# Patient Record
Sex: Female | Born: 1970 | Race: White | Hispanic: No | Marital: Married | Smoking: Never smoker
Health system: Southern US, Community
[De-identification: ages and names within clinical notes are randomized; demographics above are authoritative.]

## PROBLEM LIST (undated history)

## (undated) DIAGNOSIS — R519 Headache, unspecified: Secondary | ICD-10-CM

## (undated) DIAGNOSIS — D649 Anemia, unspecified: Secondary | ICD-10-CM

## (undated) DIAGNOSIS — R112 Nausea with vomiting, unspecified: Secondary | ICD-10-CM

## (undated) DIAGNOSIS — Z9889 Other specified postprocedural states: Secondary | ICD-10-CM

## (undated) DIAGNOSIS — F32A Depression, unspecified: Secondary | ICD-10-CM

## (undated) DIAGNOSIS — K219 Gastro-esophageal reflux disease without esophagitis: Secondary | ICD-10-CM

## (undated) DIAGNOSIS — Z8719 Personal history of other diseases of the digestive system: Secondary | ICD-10-CM

## (undated) DIAGNOSIS — T7840XA Allergy, unspecified, initial encounter: Secondary | ICD-10-CM

## (undated) DIAGNOSIS — F419 Anxiety disorder, unspecified: Secondary | ICD-10-CM

## (undated) DIAGNOSIS — J342 Deviated nasal septum: Secondary | ICD-10-CM

## (undated) DIAGNOSIS — F329 Major depressive disorder, single episode, unspecified: Secondary | ICD-10-CM

## (undated) DIAGNOSIS — J343 Hypertrophy of nasal turbinates: Secondary | ICD-10-CM

## (undated) DIAGNOSIS — N189 Chronic kidney disease, unspecified: Secondary | ICD-10-CM

## (undated) DIAGNOSIS — I10 Essential (primary) hypertension: Secondary | ICD-10-CM

## (undated) DIAGNOSIS — M5136 Other intervertebral disc degeneration, lumbar region: Secondary | ICD-10-CM

## (undated) DIAGNOSIS — E039 Hypothyroidism, unspecified: Secondary | ICD-10-CM

## (undated) DIAGNOSIS — R0683 Snoring: Secondary | ICD-10-CM

## (undated) DIAGNOSIS — Z8489 Family history of other specified conditions: Secondary | ICD-10-CM

## (undated) DIAGNOSIS — R51 Headache: Secondary | ICD-10-CM

## (undated) DIAGNOSIS — K589 Irritable bowel syndrome without diarrhea: Secondary | ICD-10-CM

## (undated) DIAGNOSIS — N809 Endometriosis, unspecified: Secondary | ICD-10-CM

## (undated) HISTORY — DX: Essential (primary) hypertension: I10

## (undated) HISTORY — PX: ORIF ANKLE FRACTURE: SHX5408

## (undated) HISTORY — PX: HERNIA REPAIR: SHX51

## (undated) HISTORY — PX: KNEE ARTHROSCOPY: SHX127

## (undated) HISTORY — PX: SPINE SURGERY: SHX786

## (undated) HISTORY — DX: Anemia, unspecified: D64.9

## (undated) HISTORY — DX: Endometriosis, unspecified: N80.9

## (undated) HISTORY — DX: Chronic kidney disease, unspecified: N18.9

## (undated) HISTORY — PX: TUBAL LIGATION: SHX77

## (undated) HISTORY — PX: COLONOSCOPY: SHX174

## (undated) HISTORY — PX: HIATAL HERNIA REPAIR: SHX195

## (undated) HISTORY — PX: UPPER GI ENDOSCOPY: SHX6162

## (undated) HISTORY — DX: Gastro-esophageal reflux disease without esophagitis: K21.9

## (undated) HISTORY — DX: Allergy, unspecified, initial encounter: T78.40XA

---

## 2003-11-11 HISTORY — PX: ABDOMINAL HYSTERECTOMY: SHX81

## 2004-07-04 ENCOUNTER — Ambulatory Visit (HOSPITAL_COMMUNITY): Admission: RE | Admit: 2004-07-04 | Discharge: 2004-07-04 | Payer: Self-pay | Admitting: Unknown Physician Specialty

## 2004-07-10 ENCOUNTER — Ambulatory Visit (HOSPITAL_COMMUNITY): Admission: RE | Admit: 2004-07-10 | Discharge: 2004-07-10 | Payer: Self-pay | Admitting: Unknown Physician Specialty

## 2004-09-02 ENCOUNTER — Encounter: Admission: RE | Admit: 2004-09-02 | Discharge: 2004-09-02 | Payer: Self-pay | Admitting: Specialist

## 2004-10-23 ENCOUNTER — Ambulatory Visit: Payer: Self-pay | Admitting: *Deleted

## 2004-10-29 ENCOUNTER — Ambulatory Visit: Payer: Self-pay | Admitting: Family Medicine

## 2004-11-21 ENCOUNTER — Observation Stay (HOSPITAL_COMMUNITY): Admission: RE | Admit: 2004-11-21 | Discharge: 2004-11-22 | Payer: Self-pay | Admitting: Specialist

## 2004-11-21 HISTORY — PX: LUMBAR LAMINECTOMY/DECOMPRESSION MICRODISCECTOMY: SHX5026

## 2005-01-10 ENCOUNTER — Ambulatory Visit: Payer: Self-pay | Admitting: Family Medicine

## 2005-02-06 ENCOUNTER — Ambulatory Visit: Payer: Self-pay | Admitting: Family Medicine

## 2005-04-04 ENCOUNTER — Ambulatory Visit: Payer: Self-pay | Admitting: Family Medicine

## 2005-07-03 ENCOUNTER — Ambulatory Visit: Payer: Self-pay | Admitting: Family Medicine

## 2005-08-19 ENCOUNTER — Ambulatory Visit: Payer: Self-pay | Admitting: Family Medicine

## 2005-08-21 ENCOUNTER — Emergency Department (HOSPITAL_COMMUNITY): Admission: EM | Admit: 2005-08-21 | Discharge: 2005-08-21 | Payer: Self-pay | Admitting: Emergency Medicine

## 2005-09-10 ENCOUNTER — Ambulatory Visit: Payer: Self-pay | Admitting: Family Medicine

## 2006-04-02 ENCOUNTER — Ambulatory Visit: Payer: Self-pay | Admitting: Family Medicine

## 2006-04-16 ENCOUNTER — Ambulatory Visit: Payer: Self-pay | Admitting: Family Medicine

## 2006-05-04 ENCOUNTER — Ambulatory Visit: Payer: Self-pay | Admitting: Family Medicine

## 2006-10-02 ENCOUNTER — Ambulatory Visit (HOSPITAL_COMMUNITY): Admission: RE | Admit: 2006-10-02 | Discharge: 2006-10-02 | Payer: Self-pay | Admitting: Unknown Physician Specialty

## 2006-10-06 ENCOUNTER — Ambulatory Visit: Payer: Self-pay | Admitting: Family Medicine

## 2006-12-03 ENCOUNTER — Ambulatory Visit: Payer: Self-pay | Admitting: Family Medicine

## 2006-12-18 ENCOUNTER — Ambulatory Visit (HOSPITAL_COMMUNITY): Admission: RE | Admit: 2006-12-18 | Discharge: 2006-12-18 | Payer: Self-pay | Admitting: Unknown Physician Specialty

## 2006-12-24 ENCOUNTER — Ambulatory Visit: Payer: Self-pay | Admitting: Family Medicine

## 2007-03-25 ENCOUNTER — Ambulatory Visit (HOSPITAL_COMMUNITY): Admission: RE | Admit: 2007-03-25 | Discharge: 2007-03-26 | Payer: Self-pay | Admitting: Neurosurgery

## 2007-03-25 HISTORY — PX: ANTERIOR CERVICAL DECOMP/DISCECTOMY FUSION: SHX1161

## 2007-05-04 ENCOUNTER — Ambulatory Visit: Payer: Self-pay | Admitting: Family Medicine

## 2009-02-06 ENCOUNTER — Emergency Department (HOSPITAL_COMMUNITY): Admission: EM | Admit: 2009-02-06 | Discharge: 2009-02-06 | Payer: Self-pay | Admitting: Emergency Medicine

## 2010-01-28 ENCOUNTER — Emergency Department (HOSPITAL_COMMUNITY): Admission: EM | Admit: 2010-01-28 | Discharge: 2010-01-28 | Payer: Self-pay | Admitting: Emergency Medicine

## 2010-11-10 DIAGNOSIS — M5136 Other intervertebral disc degeneration, lumbar region: Secondary | ICD-10-CM

## 2010-11-10 DIAGNOSIS — M51369 Other intervertebral disc degeneration, lumbar region without mention of lumbar back pain or lower extremity pain: Secondary | ICD-10-CM

## 2010-11-10 HISTORY — DX: Other intervertebral disc degeneration, lumbar region without mention of lumbar back pain or lower extremity pain: M51.369

## 2010-11-10 HISTORY — DX: Other intervertebral disc degeneration, lumbar region: M51.36

## 2010-12-01 ENCOUNTER — Encounter: Payer: Self-pay | Admitting: Unknown Physician Specialty

## 2011-02-03 LAB — URINALYSIS, ROUTINE W REFLEX MICROSCOPIC
Bilirubin Urine: NEGATIVE
Glucose, UA: NEGATIVE mg/dL
Ketones, ur: NEGATIVE mg/dL
Leukocytes, UA: NEGATIVE
Nitrite: NEGATIVE
Protein, ur: NEGATIVE mg/dL
Specific Gravity, Urine: 1.025 (ref 1.005–1.030)
Urobilinogen, UA: 0.2 mg/dL (ref 0.0–1.0)
pH: 6 (ref 5.0–8.0)

## 2011-02-03 LAB — URINE MICROSCOPIC-ADD ON

## 2011-02-20 LAB — CBC
HCT: 39.5 % (ref 36.0–46.0)
Hemoglobin: 13.9 g/dL (ref 12.0–15.0)
MCHC: 35.1 g/dL (ref 30.0–36.0)
MCV: 94.7 fL (ref 78.0–100.0)
Platelets: 204 10*3/uL (ref 150–400)
RBC: 4.17 MIL/uL (ref 3.87–5.11)
RDW: 12.5 % (ref 11.5–15.5)
WBC: 10.1 10*3/uL (ref 4.0–10.5)

## 2011-02-20 LAB — DIFFERENTIAL
Basophils Absolute: 0 10*3/uL (ref 0.0–0.1)
Basophils Relative: 0 % (ref 0–1)
Eosinophils Absolute: 0 10*3/uL (ref 0.0–0.7)
Eosinophils Relative: 0 % (ref 0–5)
Lymphocytes Relative: 15 % (ref 12–46)
Lymphs Abs: 1.5 10*3/uL (ref 0.7–4.0)
Monocytes Absolute: 0.9 10*3/uL (ref 0.1–1.0)
Monocytes Relative: 9 % (ref 3–12)
Neutro Abs: 7.6 10*3/uL (ref 1.7–7.7)
Neutrophils Relative %: 75 % (ref 43–77)

## 2011-02-20 LAB — URINE CULTURE: Colony Count: >=100000

## 2011-02-20 LAB — URINE MICROSCOPIC-ADD ON

## 2011-02-20 LAB — URINALYSIS, ROUTINE W REFLEX MICROSCOPIC
Bilirubin Urine: NEGATIVE
Glucose, UA: NEGATIVE mg/dL
Ketones, ur: NEGATIVE mg/dL
Nitrite: POSITIVE — AB
Specific Gravity, Urine: 1.015 (ref 1.005–1.030)
Urobilinogen, UA: 1 mg/dL (ref 0.0–1.0)
pH: 6 (ref 5.0–8.0)

## 2011-02-20 LAB — BASIC METABOLIC PANEL
BUN: 10 mg/dL (ref 6–23)
CO2: 25 mEq/L (ref 19–32)
Calcium: 8.7 mg/dL (ref 8.4–10.5)
Chloride: 101 mEq/L (ref 96–112)
Creatinine, Ser: 0.99 mg/dL (ref 0.4–1.2)
GFR calc Af Amer: >60 mL/min (ref >60)
GFR calc non Af Amer: >60 mL/min (ref >60)
Glucose, Bld: 113 mg/dL — ABNORMAL HIGH (ref 70–99)
Potassium: 3.8 mEq/L (ref 3.5–5.1)
Sodium: 133 mEq/L — ABNORMAL LOW (ref 135–145)

## 2011-03-28 NOTE — Op Note (Signed)
Hannah Lynch, Hannah Lynch                   ACCOUNT NO.:  [id number]   MEDICAL RECORD NO.:  [id number]          PATIENT TYPE:  INP   LOCATION:  0001                         FACILITY:  [facility]   PHYSICIAN:  Jene Every, M.D.    DATE OF BIRTH:  [birth year]   DATE OF PROCEDURE:  11/21/2004  DATE OF DISCHARGE:                                 OPERATIVE REPORT   PREOPERATIVE DIAGNOSIS:  Spinal stenosis, herniated nucleus pulposus, L4-5,  left.   POSTOPERATIVE DIAGNOSIS:  Spinal stenosis, herniated nucleus pulposus, L4-5,  left.   PROCEDURE:  Lateral recess decompression at L4-5, foraminotomy of L5,  microdiskectomy of L4-5.   SURGEON:  Jene Every, M.D.   ASSISTANT:  Roma Schanz, P.A.   INDICATIONS FOR PROCEDURE:  This is a 40 year old with refractory L5  radicular pain secondary to a multifactorial spinal stenosis at 4-5.  Operative intervention is indicated for decompression of the L5 nerve root  due to failure to improve with conservative treatment.  The risks and  benefits were discussed, including bleeding, infection, damage to  neurovascular structures, CSF leakage, epidural fibrosis, adjacent disease,  need for fusion in the future, etc.   DESCRIPTION OF PROCEDURE:  With the patient in the supine position and after  adequate induction of anesthesia and 1 g of Kefzol, she was placed prone on  the __________.  All bony prominences were well-padded.  The lumbar region  was prepped and draped in the usual sterile fashion.   Two 18-gauge spinal needles were utilized to localize the 4-5 interspace,  confirmed with x-ray.  An incision was made from the spinous process 4 to 5.  The subcutaneous tissue was dissected, with cautery utilized to achieve  hemostasis.  The dorsolumbar fascia was identified and divided in line with  the skin incision.  The paraspinous muscle was elevated from the lamina of 4  and 5.  The McCullough retractor was placed.  A Penfield 4 in the inner  laminar  space, confirmed with x-ray at 4-5.  The operating microscope was  draped and brought into the surgical field.  Hemilaminotomy of the caudad  edge of 4 was performed with a high-speed bur and a 3-mm Kerrison.  The  ligamentum flavum attachment of the cephalad edge of 5.  Foraminotomy of L5  was performed.  The lateral recess stenosis was noted as the ligamentum  flavum was removed from the interspace.  This was a combination of facet  hypertrophy, lateral disk protrusion, and a vascular leech.  This vascular  leech was divided and cauterized.  The foraminotomy of L5 was performed.  The 5 root was gently mobilized immediately.  A focal disk protrusion was  noted.  Annulotomy was performed, and full diskectomy of all herniated  material was performed in piecemeal fashion utilizing multiple passes with a  pituitary.  Following this, there was adequate decompression of the L5 nerve  root.  There was good 1-cm excursion medial to the pedicle without  difficulty.  __________ probe  was placed in the foramen of 4 and 5 and  found to be widely  patent.  The disk space was copiously irrigated with  antibiotic irrigation.  Inspection revealed no CSF leakage or active  bleeding.  Bone wax was placed over the cancellous surfaces.   Next, all instrumentation was removed.  The paraspinous muscle was  inspected, and there was no evidence of active bleeding.  The dorsolumbar  fascia was reapproximated with 0 Vicryl interrupted figure-of-eight sutures.  The subcutaneous tissue was reapproximated with 2-0 Vicryl simple sutures.  The skin was reapproximated with 4-0 subcuticular Prolene.  A sterile  dressing was applied.   The patient was placed supine on the hospital bed, extubated without  difficulty, and transported to the recovery room in satisfactory condition.  The patient tolerated the procedure well.  There were no complications.     Trey Paula   JB/MEDQ  D:  11/21/2004  T:  11/21/2004  Job:  [id number]

## 2011-03-28 NOTE — Op Note (Signed)
Hannah Lynch, Hannah Lynch                   ACCOUNT NO.:  [id number]   MEDICAL RECORD NO.:  [id number]          PATIENT TYPE:  AMB   LOCATION:  SDS                          FACILITY:  [facility]   PHYSICIAN:  Danae Orleans. Venetia Maxon, M.D.  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   PROCEDURE:  Anterior cervical decompression and fusion, C4-5 with PEEK  interbody cage, morcellized bone autograft along with demineralized bone  matrix and anterior cervical plate.   SURGEON:  Danae Orleans. Venetia Maxon, M.D.   ASSISTANT:  Georgiann Cocker, RN.  Hilda Lias, M.D.   ANESTHESIA:  General endotracheal anesthesia.   BLOOD LOSS:  Minimal.   COMPLICATIONS:  None.   DISPOSITION:  Recovery.   INDICATIONS:  Hannah Lynch is a 40 year old woman with significant right C5  radiculopathy with right deltoid weakness and right side and arm and  neck pain.  She is disk herniation at C4-5 on the right.  We have  elected to take her surgery for anterior cervical decompression and  fusion at this affected level.   PROCEDURE:  Miss Cannady day was brought to the operating room.  Following  satisfactory uncomplicated induction of general endotracheal anesthesia  and placing an intravenous lines, the patient was placed in the supine  position on the operating table.  Her neck was placed in slight  extension.  She was placed in 10 pounds of halter traction.  Her  anterior neck was then prepped and draped in the usual sterile fashion.  The area of planned incision was infiltrated with 0.25% Marcaine and  0.5% lidocaine with 1:200,000 epinephrine.  Incision was made  from  midline to the anterior border of the sternocleidomastoid muscle and  sharply through platysma layer.  Subplatysmal dissection was performed  exposing the anterior border of the sternocleidomastoid muscle using  blunt dissection.  The  carotid sheath was kept lateral, the trachea and  esophagus kept medial, exposing the anterior cervical spine.  Bent  spinal needle was placed in what was felt to be the C4-5 level and this  was confirmed on intraoperative x-ray.  Subsequently the longus colli  muscles were taken down from the anterior cervical spine from C4 through  5 levels bilaterally using electrocautery and Key elevator and self-  retaining shadow line retractor was placed to facilitate exposure.  The  interspace was then incised and with meticulous care was removed in  piecemeal fashion.  The end plates were stripped of residual disk  material from the cartilaginous end plates.  Disk space spreaders were  placed using initially to one side and then to the other.  The end  plates were decorticated and uncinate spurs drilled down.  There was  foraminal disk herniation on the right at the  C4-5 level causing right  C5 nerve root compression.  The posterior longitudinal ligament was then  incised and removed in piecemeal fashion and uncinate spurs were removed  with 2 and 3 mm  gold-tipped Kerrison rongeurs.  The C5 nerve roots were  felt to be well decompressed bilaterally, particularly on the right.  After hemostasis was assured with Gelfoam soaked in thrombin, and after  trial sizing a 7-mm small PEEK interbody cage was selected, packed with  morcellized bone autograft which had been retained from drilling of the  end plates and mixed with demineralized bone matrix and subsequently  this was tamped into position and countersunk appropriately.  The  anterior cervical spine was then prepared for plating and subsequently  an anterior cervical plate with 14 mm variable angle screws  were placed,  two at C4, two at C5. All screws had excellent purchase and locking  mechanisms were engaged.  Final x-ray demonstrated well-positioned  interbody graft anterior cervical plate.  The traction weight was  removed prior to placing the plate.  Subsequently hemostasis assured.  Soft tissues inspected and found to be in good repair and the platysma  layer was reapproximated with 3-0 Vicryl sutures.  The skin edges were  approximated with 3-0 Vicryl interrupted inverted sutures.  The wound  was dressed with Dermabond.  The patient was extubated in the operating  room, taken to recovery room in stable satisfactory condition, having  tolerated the procedure well.  Counts correct at the end of the case.      Danae Orleans. Venetia Maxon, M.D.  Electronically Signed     JDS/MEDQ  D:  03/25/2007  T:  03/25/2007  Job:  [id number]

## 2011-09-28 ENCOUNTER — Emergency Department (HOSPITAL_COMMUNITY)
Admission: EM | Admit: 2011-09-28 | Discharge: 2011-09-29 | Disposition: A | Discharge status: Home / Self Care | Payer: Self-pay | Attending: Emergency Medicine | Admitting: Emergency Medicine

## 2011-09-28 ENCOUNTER — Encounter: Payer: Self-pay | Admitting: *Deleted

## 2011-09-28 DIAGNOSIS — J40 Bronchitis, not specified as acute or chronic: Secondary | ICD-10-CM | POA: Insufficient documentation

## 2011-09-28 DIAGNOSIS — R220 Localized swelling, mass and lump, head: Secondary | ICD-10-CM | POA: Insufficient documentation

## 2011-09-28 DIAGNOSIS — M546 Pain in thoracic spine: Secondary | ICD-10-CM | POA: Insufficient documentation

## 2011-09-28 DIAGNOSIS — M940 Chondrocostal junction syndrome [Tietze]: Secondary | ICD-10-CM | POA: Insufficient documentation

## 2011-09-28 DIAGNOSIS — R05 Cough: Secondary | ICD-10-CM | POA: Insufficient documentation

## 2011-09-28 DIAGNOSIS — R071 Chest pain on breathing: Secondary | ICD-10-CM | POA: Insufficient documentation

## 2011-09-28 DIAGNOSIS — R070 Pain in throat: Secondary | ICD-10-CM | POA: Insufficient documentation

## 2011-09-28 DIAGNOSIS — H579 Unspecified disorder of eye and adnexa: Secondary | ICD-10-CM | POA: Insufficient documentation

## 2011-09-28 DIAGNOSIS — R059 Cough, unspecified: Secondary | ICD-10-CM | POA: Insufficient documentation

## 2011-09-28 DIAGNOSIS — R1110 Vomiting, unspecified: Secondary | ICD-10-CM | POA: Insufficient documentation

## 2011-09-28 DIAGNOSIS — H5789 Other specified disorders of eye and adnexa: Secondary | ICD-10-CM | POA: Insufficient documentation

## 2011-09-28 MED ORDER — IPRATROPIUM BROMIDE 0.02 % IN SOLN
0.5000 mg | Freq: Once | RESPIRATORY_TRACT | Status: AC
  Administered 2011-09-29: 0.5 mg via RESPIRATORY_TRACT
  Filled 2011-09-28: qty 2.5

## 2011-09-28 MED ORDER — ALBUTEROL SULFATE (5 MG/ML) 0.5% IN NEBU
5.0000 mg | INHALATION_SOLUTION | Freq: Once | RESPIRATORY_TRACT | Status: AC
  Administered 2011-09-29: 5 mg via RESPIRATORY_TRACT
  Filled 2011-09-28: qty 1

## 2011-09-28 NOTE — ED Notes (Signed)
PA at bedside for exam.

## 2011-09-28 NOTE — ED Notes (Signed)
C/o cold sx x2 weeks, runny nose, productive yellow/green congestion, cough, sore throat, (denies: fever, nvd), describes post tussive emesis, "has been real tired", also pain with inspiration & cough in chest and shoulder blades. Non-smoker, denies BCP.

## 2011-09-29 ENCOUNTER — Emergency Department (HOSPITAL_COMMUNITY): Payer: Self-pay

## 2011-09-29 LAB — RAPID STREP SCREEN (MED CTR MEBANE ONLY): Streptococcus, Group A Screen (Direct): NEGATIVE

## 2011-09-29 MED ORDER — IBUPROFEN 400 MG PO TABS: 400.0000 mg | ORAL_TABLET | Freq: Three times a day (TID) | ORAL | Status: AC | PRN

## 2011-09-29 MED ORDER — IBUPROFEN 800 MG PO TABS
800.0000 mg | ORAL_TABLET | Freq: Once | ORAL | Status: AC
  Administered 2011-09-29: 800 mg via ORAL
  Filled 2011-09-29: qty 1

## 2011-09-29 MED ORDER — HYDROCOD POLST-CHLORPHEN POLST 10-8 MG/5ML PO LQCR: 5.0000 mL | Freq: Two times a day (BID) | ORAL | Status: DC | PRN

## 2011-09-29 MED ORDER — HYDROCOD POLST-CHLORPHEN POLST 10-8 MG/5ML PO LQCR
5.0000 mL | Freq: Once | ORAL | Status: AC
  Administered 2011-09-29: 5 mL via ORAL
  Filled 2011-09-29: qty 5

## 2011-09-29 MED ORDER — ALBUTEROL SULFATE HFA 108 (90 BASE) MCG/ACT IN AERS
2.0000 | INHALATION_SPRAY | RESPIRATORY_TRACT | Status: DC | PRN
  Administered 2011-09-29: 2 via RESPIRATORY_TRACT
  Filled 2011-09-29: qty 6.7

## 2011-09-29 NOTE — ED Provider Notes (Signed)
Medical screening examination/treatment/procedure(s) were performed by non-physician practitioner and as supervising physician I was immediately available for consultation/collaboration.  Jon R Knapp, MD 09/29/11 0506 

## 2011-09-29 NOTE — ED Notes (Signed)
Patient transported to X-ray 

## 2011-09-29 NOTE — ED Notes (Signed)
Called to [facility] [place], busy at this time, breathing treatment initiated

## 2011-09-29 NOTE — ED Provider Notes (Signed)
History     CSN: [id number] Arrival date & time: 09/28/2011  8:10 PM   First MD Initiated Contact with Patient 09/28/11 2316      Chief Complaint  Patient presents with  . URI    (Consider location/radiation/quality/duration/timing/severity/associated sxs/prior treatment) Patient is a 40 y.o. female presenting with URI. The history is provided by the patient. No language interpreter was used.  URI The primary symptoms include sore throat, cough and vomiting. Primary symptoms do not include fever, ear pain, swollen glands or wheezing. The current episode started more than 1 week ago. This is a new problem. The problem has been gradually worsening.  The sore throat is not accompanied by stridor.  Patient reports 2 week history of persistent cough productive at times is productive of yellowish green secretions. Cough has also been associated with sore throat watery itchy eyes and post tussive vomiting. States her chest wall and back now hurt with coughing. History reviewed. No pertinent past medical history.  Past Surgical History  Procedure Date  . Abdominal hysterectomy   . Back surgery   . Neck surgery   . Ankle surgery   . Knee surgery     Family History  Problem Relation Age of Onset  . Hypertension Mother   . Diabetes Mother   . Heart failure Father   . Hypertension Father   . Diabetes Father   . Heart failure Other     History  Substance Use Topics  . Smoking status: Never Smoker   . Smokeless tobacco: Not on file  . Alcohol Use: No    OB History    Grav Para Term Preterm Abortions TAB SAB Ect Mult Living                  Review of Systems  Constitutional: Negative.  Negative for fever.  HENT: Positive for sore throat. Negative for ear pain.   Eyes: Negative.   Respiratory: Positive for cough. Negative for shortness of breath, wheezing and stridor.   Cardiovascular: Negative.   Gastrointestinal: Positive for vomiting.  Genitourinary: Negative.     Musculoskeletal: Negative.   Skin: Negative.   Neurological: Negative.   Hematological: Negative.   Psychiatric/Behavioral: Negative.     Allergies  Review of patient's allergies indicates no known allergies.  Home Medications   Current Outpatient Rx  Name Route Sig Dispense Refill  . DEXTROMETHORPHAN POLISTIREX 30 MG/5ML PO LQCR Oral Take 60 mg by mouth at bedtime as needed. For cough, to help sleep     . OMEGA-3 FATTY ACIDS 1000 MG PO CAPS Oral Take 1 g by mouth daily.      Ronney Asters COLD MULTI-SYMPTOM PO Oral Take 2 tablets by mouth 2 (two) times daily as needed. For cold/flu symptoms       BP 123/79  Pulse 103  Temp(Src) 98.2 F (36.8 C) (Oral)  Resp 20  Ht 5\' 5"  (1.651 m)  Wt 200 lb (90.719 kg)  BMI 33.28 kg/m2  SpO2 97%  Physical Exam  Constitutional: She is oriented to person, place, and time. She appears well-developed and well-nourished.  HENT:  Head: Normocephalic and atraumatic.  Mouth/Throat: Uvula is midline and mucous membranes are normal. Posterior oropharyngeal edema and posterior oropharyngeal erythema present. No oropharyngeal exudate or tonsillar abscesses.  Eyes: Conjunctivae are normal.  Neck: Normal range of motion.  Cardiovascular: Normal rate and regular rhythm.   Pulmonary/Chest: Effort normal and breath sounds normal.       Frequent coughing noted  Musculoskeletal: Normal range of motion.  Neurological: She is alert and oriented to person, place, and time.  Skin: Skin is warm and dry.  Psychiatric: She has a normal mood and affect.    ED Course  Procedures patient reports cough improved after neb. We'll plan for discharge home with treatment for bronchitis and encourage followup with primary care provider sometime this week. Patient agreeable with plan.   Labs Reviewed  RAPID STREP SCREEN   Dg Chest 2 View  09/29/2011  *RADIOLOGY REPORT*  Clinical Data: Cough, shortness of breath, chest pain.  CHEST - 2 VIEW  Comparison: None.   Findings: Minimal left lung base scarring versus atelectasis. Otherwise, no focal consolidation, pleural effusion, or pneumothorax. Cardiomediastinal contours within normal limits. Partially imaged cervical fusion hardware.  No acute osseous abnormality.  IMPRESSION: Minimal left lung base scarring versus atelectasis. Otherwise, no acute cardiopulmonary process.  Original Report Authenticated By: Waneta Martins, M.D.     No diagnosis found.    MDM  HPI, PE and diagnostic findings c/w bronchitis.        Leanne Chang, NP 09/29/11 0120

## 2011-11-11 HISTORY — PX: CERVICAL FUSION: SHX112

## 2011-12-18 ENCOUNTER — Other Ambulatory Visit: Payer: Self-pay | Admitting: Neurosurgery

## 2011-12-18 DIAGNOSIS — M47812 Spondylosis without myelopathy or radiculopathy, cervical region: Secondary | ICD-10-CM

## 2011-12-21 ENCOUNTER — Ambulatory Visit
Admission: RE | Admit: 2011-12-21 | Discharge: 2011-12-21 | Disposition: A | Discharge status: Home / Self Care | Payer: BC Managed Care – PPO | Source: Ambulatory Visit | Attending: Neurosurgery | Admitting: Neurosurgery

## 2011-12-21 DIAGNOSIS — M47812 Spondylosis without myelopathy or radiculopathy, cervical region: Secondary | ICD-10-CM

## 2013-11-18 ENCOUNTER — Other Ambulatory Visit: Payer: Self-pay

## 2013-11-18 DIAGNOSIS — Z1231 Encounter for screening mammogram for malignant neoplasm of breast: Secondary | ICD-10-CM

## 2013-12-08 ENCOUNTER — Ambulatory Visit
Admission: RE | Admit: 2013-12-08 | Discharge: 2013-12-08 | Disposition: A | Discharge status: Home / Self Care | Payer: Medicaid Other | Source: Ambulatory Visit

## 2013-12-08 DIAGNOSIS — Z1231 Encounter for screening mammogram for malignant neoplasm of breast: Secondary | ICD-10-CM

## 2014-02-08 ENCOUNTER — Ambulatory Visit: Payer: Medicaid Other

## 2014-03-07 ENCOUNTER — Encounter (HOSPITAL_COMMUNITY): Payer: Self-pay | Admitting: Emergency Medicine

## 2014-03-07 ENCOUNTER — Emergency Department (HOSPITAL_COMMUNITY)
Admission: EM | Admit: 2014-03-07 | Discharge: 2014-03-07 | Disposition: A | Discharge status: Home / Self Care | Payer: No Typology Code available for payment source | Attending: Emergency Medicine | Admitting: Emergency Medicine

## 2014-03-07 ENCOUNTER — Emergency Department (HOSPITAL_COMMUNITY): Payer: No Typology Code available for payment source

## 2014-03-07 DIAGNOSIS — S161XXA Strain of muscle, fascia and tendon at neck level, initial encounter: Secondary | ICD-10-CM

## 2014-03-07 DIAGNOSIS — S139XXA Sprain of joints and ligaments of unspecified parts of neck, initial encounter: Secondary | ICD-10-CM | POA: Insufficient documentation

## 2014-03-07 DIAGNOSIS — Y939 Activity, unspecified: Secondary | ICD-10-CM | POA: Insufficient documentation

## 2014-03-07 DIAGNOSIS — R209 Unspecified disturbances of skin sensation: Secondary | ICD-10-CM | POA: Insufficient documentation

## 2014-03-07 DIAGNOSIS — R202 Paresthesia of skin: Secondary | ICD-10-CM

## 2014-03-07 DIAGNOSIS — IMO0002 Reserved for concepts with insufficient information to code with codable children: Secondary | ICD-10-CM | POA: Insufficient documentation

## 2014-03-07 DIAGNOSIS — Z79899 Other long term (current) drug therapy: Secondary | ICD-10-CM | POA: Insufficient documentation

## 2014-03-07 MED ORDER — HYDROCODONE-ACETAMINOPHEN 5-325 MG PO TABS: ORAL_TABLET | ORAL | Status: DC

## 2014-03-07 MED ORDER — HYDROCODONE-ACETAMINOPHEN 5-325 MG PO TABS
2.0000 | ORAL_TABLET | Freq: Once | ORAL | Status: AC
  Administered 2014-03-07: 2 via ORAL
  Filled 2014-03-07: qty 2

## 2014-03-07 NOTE — ED Notes (Signed)
Patient transported to CT 
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
## 2014-03-07 NOTE — Discharge Instructions (Signed)
Cervical Sprain °A cervical sprain is an injury in the neck in which the strong, fibrous tissues (ligaments) that connect your neck bones stretch or tear. Cervical sprains can range from mild to severe. Severe cervical sprains can cause the neck vertebrae to be unstable. This can lead to damage of the spinal cord and can result in serious nervous system problems. The amount of time it takes for a cervical sprain to get better depends on the cause and extent of the injury. Most cervical sprains heal in 1 to 3 weeks. °CAUSES  °Severe cervical sprains may be caused by:  °· Contact sport injuries (such as from football, rugby, wrestling, hockey, auto racing, gymnastics, diving, martial arts, or boxing).   °· Motor vehicle collisions.   °· Whiplash injuries. This is an injury from a sudden forward-and backward whipping movement of the head and neck.  °· Falls.   °Mild cervical sprains may be caused by:  °· Being in an awkward position, such as while cradling a telephone between your ear and shoulder.   °· Sitting in a chair that does not offer proper support.   °· Working at a poorly designed computer station.   °· Looking up or down for long periods of time.   °SYMPTOMS  °· Pain, soreness, stiffness, or a burning sensation in the front, back, or sides of the neck. This discomfort may develop immediately after the injury or slowly, 24 hours or more after the injury.   °· Pain or tenderness directly in the middle of the back of the neck.   °· Shoulder or upper back pain.   °· Limited ability to move the neck.   °· Headache.   °· Dizziness.   °· Weakness, numbness, or tingling in the hands or arms.   °· Muscle spasms.   °· Difficulty swallowing or chewing.   °· Tenderness and swelling of the neck.   °DIAGNOSIS  °Most of the time your health care provider can diagnose a cervical sprain by taking your history and doing a physical exam. Your health care provider will ask about previous neck injuries and any known neck  problems, such as arthritis in the neck. X-rays may be taken to find out if there are any other problems, such as with the bones of the neck. Other tests, such as a CT scan or MRI, may also be needed.  °TREATMENT  °Treatment depends on the severity of the cervical sprain. Mild sprains can be treated with rest, keeping the neck in place (immobilization), and pain medicines. Severe cervical sprains are immediately immobilized. Further treatment is done to help with pain, muscle spasms, and other symptoms and may include: °· Medicines, such as pain relievers, numbing medicines, or muscle relaxants.   °· Physical therapy. This may involve stretching exercises, strengthening exercises, and posture training. Exercises and improved posture can help stabilize the neck, strengthen muscles, and help stop symptoms from returning.   °HOME CARE INSTRUCTIONS  °· Put ice on the injured area.   °· Put ice in a plastic bag.   °· Place a towel between your skin and the bag.   °· Leave the ice on for 15 20 minutes, 3 4 times a day.   °· If your injury was severe, you may have been given a cervical collar to wear. A cervical collar is a two-piece collar designed to keep your neck from moving while it heals. °· Do not remove the collar unless instructed by your health care provider. °· If you have long hair, keep it outside of the collar. °· Ask your health care provider before making any adjustments to your collar.   Minor adjustments may be required over time to improve comfort and reduce pressure on your chin or on the back of your head.  Ifyou are allowed to remove the collar for cleaning or bathing, follow your health care provider's instructions on how to do so safely.  Keep your collar clean by wiping it with mild soap and water and drying it completely. If the collar you have been given includes removable pads, remove them every 1 2 days and hand wash them with soap and water. Allow them to air dry. They should be completely  dry before you wear them in the collar.  If you are allowed to remove the collar for cleaning and bathing, wash and dry the skin of your neck. Check your skin for irritation or sores. If you see any, tell your health care provider.  Do not drive while wearing the collar.   Only take over-the-counter or prescription medicines for pain, discomfort, or fever as directed by your health care provider.   Keep all follow-up appointments as directed by your health care provider.   Keep all physical therapy appointments as directed by your health care provider.   Make any needed adjustments to your workstation to promote good posture.   Avoid positions and activities that make your symptoms worse.   Warm up and stretch before being active to help prevent problems.  SEEK MEDICAL CARE IF:   Your pain is not controlled with medicine.   You are unable to decrease your pain medicine over time as planned.   Your activity level is not improving as expected.  SEEK IMMEDIATE MEDICAL CARE IF:   You develop any bleeding.  You develop stomach upset.  You have signs of an allergic reaction to your medicine.   Your symptoms get worse.   You develop new, unexplained symptoms.   You have numbness, tingling, weakness, or paralysis in any part of your body.  MAKE SURE YOU:   Understand these instructions.  Will watch your condition.  Will get help right away if you are not doing well or get worse. Document Released: 08/24/2007 Document Revised: 08/17/2013 Document Reviewed: 05/04/2013 [facility] Patient Information 2014 [place], [place].    Narcotic and benzodiazepine use may cause drowsiness, slowed breathing or dependence.  Please use with caution and do not drive, operate machinery or watch young children alone while taking them.  Taking combinations of these medications or drinking alcohol will potentiate these effects.
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
## 2014-03-08 ENCOUNTER — Emergency Department (HOSPITAL_COMMUNITY)
Admission: EM | Admit: 2014-03-08 | Discharge: 2014-03-09 | Disposition: A | Discharge status: Home / Self Care | Payer: No Typology Code available for payment source | Attending: Emergency Medicine | Admitting: Emergency Medicine

## 2014-03-08 ENCOUNTER — Encounter (HOSPITAL_COMMUNITY): Payer: Self-pay | Admitting: Emergency Medicine

## 2014-03-08 DIAGNOSIS — Y92410 Unspecified street and highway as the place of occurrence of the external cause: Secondary | ICD-10-CM | POA: Insufficient documentation

## 2014-03-08 DIAGNOSIS — Y9389 Activity, other specified: Secondary | ICD-10-CM | POA: Insufficient documentation

## 2014-03-08 DIAGNOSIS — M545 Low back pain, unspecified: Secondary | ICD-10-CM | POA: Insufficient documentation

## 2014-03-08 DIAGNOSIS — M549 Dorsalgia, unspecified: Secondary | ICD-10-CM

## 2014-03-08 MED ORDER — METHOCARBAMOL 500 MG PO TABS: 750.0000 mg | ORAL_TABLET | Freq: Once | ORAL | Status: DC

## 2014-03-08 MED ORDER — METHOCARBAMOL 500 MG PO TABS
500.0000 mg | ORAL_TABLET | Freq: Once | ORAL | Status: AC
  Administered 2014-03-08: 500 mg via ORAL
  Filled 2014-03-08: qty 1

## 2014-03-08 MED ORDER — HYDROMORPHONE HCL PF 1 MG/ML IJ SOLN
1.0000 mg | Freq: Once | INTRAMUSCULAR | Status: AC
  Administered 2014-03-08: 1 mg via INTRAMUSCULAR
  Filled 2014-03-08: qty 1
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
## 2014-03-09 MED ORDER — METHOCARBAMOL 500 MG PO TABS: 500.0000 mg | ORAL_TABLET | Freq: Two times a day (BID) | ORAL | Status: DC
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
## 2014-03-09 NOTE — ED Provider Notes (Signed)
Medical screening examination/treatment/procedure(s) were performed by non-physician practitioner and as supervising physician I was immediately available for consultation/collaboration.    Vida RollerBrian D Miller, MD 03/09/14 [phone]

## 2014-08-24 ENCOUNTER — Other Ambulatory Visit: Payer: Self-pay | Admitting: Neurosurgery

## 2014-09-06 ENCOUNTER — Encounter (HOSPITAL_COMMUNITY): Payer: Self-pay | Admitting: Pharmacy Technician

## 2014-09-14 ENCOUNTER — Encounter (HOSPITAL_COMMUNITY): Payer: Self-pay

## 2014-09-14 ENCOUNTER — Other Ambulatory Visit (HOSPITAL_COMMUNITY): Payer: Medicaid Other

## 2014-09-14 ENCOUNTER — Encounter (HOSPITAL_COMMUNITY)
Admission: RE | Admit: 2014-09-14 | Discharge: 2014-09-14 | Disposition: A | Discharge status: Home / Self Care | Payer: Medicaid Other | Source: Ambulatory Visit | Attending: Neurosurgery | Admitting: Neurosurgery

## 2014-09-14 DIAGNOSIS — M5106 Intervertebral disc disorders with myelopathy, lumbar region: Secondary | ICD-10-CM | POA: Insufficient documentation

## 2014-09-14 DIAGNOSIS — Z01812 Encounter for preprocedural laboratory examination: Secondary | ICD-10-CM | POA: Insufficient documentation

## 2014-09-14 HISTORY — DX: Other specified postprocedural states: R11.2

## 2014-09-14 HISTORY — DX: Family history of other specified conditions: Z84.89

## 2014-09-14 HISTORY — DX: Other specified postprocedural states: Z98.890

## 2014-09-14 HISTORY — DX: Major depressive disorder, single episode, unspecified: F32.9

## 2014-09-14 HISTORY — DX: Depression, unspecified: F32.A

## 2014-09-14 HISTORY — DX: Anxiety disorder, unspecified: F41.9

## 2014-09-14 LAB — BASIC METABOLIC PANEL
Anion gap: 11 (ref 5–15)
BUN: 15 mg/dL (ref 6–23)
CO2: 26 mEq/L (ref 19–32)
Calcium: 9.3 mg/dL (ref 8.4–10.5)
Chloride: 101 mEq/L (ref 96–112)
Creatinine, Ser: 0.93 mg/dL (ref 0.50–1.10)
GFR calc Af Amer: 87 mL/min — ABNORMAL LOW (ref >90)
GFR calc non Af Amer: 75 mL/min — ABNORMAL LOW (ref >90)
Glucose, Bld: 82 mg/dL (ref 70–99)
Potassium: 4.4 mEq/L (ref 3.7–5.3)
Sodium: 138 mEq/L (ref 137–147)

## 2014-09-14 LAB — SURGICAL PCR SCREEN
MRSA, PCR: NEGATIVE
Staphylococcus aureus: POSITIVE — AB

## 2014-09-14 LAB — HEMOGLOBIN: Hemoglobin: 13.9 g/dL (ref 12.0–15.0)

## 2014-09-16 NOTE — Discharge Instructions (Signed)
Wound Care Leave incision open to air. You may shower. Do not scrub directly on incision.  Do not put any creams, lotions, or ointments on incision. Activity Walk each and every day, increasing distance each day. No lifting greater than 5 lbs.  Avoid bending, arching, and twisting. No driving for 2 weeks; may ride as a passenger locally.  Diet Resume your normal diet.  Return to Work Will be discussed at you follow up appointment. Call Your Doctor If Any of These Occur Redness, drainage, or swelling at the wound.  Temperature greater than 101 degrees. Severe pain not relieved by pain medication. Incision starts to come apart. Follow Up Appt Call today for appointment in 3-4 weeks [phone]) or for problems.  If you have any hardware placed in your spine, you will need an x-ray before your appointment.  

## 2014-09-17 MED ORDER — CEFAZOLIN SODIUM-DEXTROSE 2-3 GM-% IV SOLR
2.0000 g | INTRAVENOUS | Status: DC
  Filled 2014-09-17: qty 50

## 2014-09-18 ENCOUNTER — Encounter (HOSPITAL_COMMUNITY): Payer: Self-pay | Admitting: *Deleted

## 2014-09-18 ENCOUNTER — Encounter (HOSPITAL_COMMUNITY)
Admission: RE | Disposition: A | Discharge status: Home / Self Care | Payer: Self-pay | Source: Ambulatory Visit | Attending: Neurosurgery

## 2014-09-18 ENCOUNTER — Ambulatory Visit (HOSPITAL_COMMUNITY): Payer: Self-pay | Admitting: Anesthesiology

## 2014-09-18 ENCOUNTER — Ambulatory Visit (HOSPITAL_COMMUNITY): Payer: Medicaid Other | Admitting: Anesthesiology

## 2014-09-18 ENCOUNTER — Ambulatory Visit (HOSPITAL_COMMUNITY)
Admission: RE | Admit: 2014-09-18 | Discharge: 2014-09-18 | Disposition: A | Discharge status: Home / Self Care | Payer: Self-pay | Source: Ambulatory Visit | Attending: Neurosurgery | Admitting: Neurosurgery

## 2014-09-18 DIAGNOSIS — Z539 Procedure and treatment not carried out, unspecified reason: Secondary | ICD-10-CM | POA: Insufficient documentation

## 2014-09-18 DIAGNOSIS — M5126 Other intervertebral disc displacement, lumbar region: Secondary | ICD-10-CM | POA: Insufficient documentation

## 2014-09-18 SURGERY — CANCELLED PROCEDURE

## 2014-09-18 MED ORDER — OXYCODONE HCL 5 MG PO TABS: 5.0000 mg | ORAL_TABLET | Freq: Once | ORAL | Status: DC | PRN

## 2014-09-18 MED ORDER — PHENYLEPHRINE 40 MCG/ML (10ML) SYRINGE FOR IV PUSH (FOR BLOOD PRESSURE SUPPORT)
PREFILLED_SYRINGE | INTRAVENOUS | Status: AC
  Filled 2014-09-18: qty 10

## 2014-09-18 MED ORDER — PROPOFOL 10 MG/ML IV BOLUS
INTRAVENOUS | Status: AC
  Filled 2014-09-18: qty 20

## 2014-09-18 MED ORDER — LIDOCAINE HCL (CARDIAC) 20 MG/ML IV SOLN
INTRAVENOUS | Status: AC
  Filled 2014-09-18: qty 5

## 2014-09-18 MED ORDER — ARTIFICIAL TEARS OP OINT
TOPICAL_OINTMENT | OPHTHALMIC | Status: AC
  Filled 2014-09-18: qty 3.5

## 2014-09-18 MED ORDER — PROMETHAZINE HCL 25 MG/ML IJ SOLN: 6.2500 mg | INTRAMUSCULAR | Status: DC | PRN

## 2014-09-18 MED ORDER — OXYCODONE HCL 5 MG/5ML PO SOLN: 5.0000 mg | Freq: Once | ORAL | Status: DC | PRN

## 2014-09-18 MED ORDER — ROCURONIUM BROMIDE 50 MG/5ML IV SOLN
INTRAVENOUS | Status: AC
  Filled 2014-09-18: qty 1

## 2014-09-18 MED ORDER — ONDANSETRON HCL 4 MG/2ML IJ SOLN
INTRAMUSCULAR | Status: AC
  Filled 2014-09-18: qty 2

## 2014-09-18 MED ORDER — HYDROMORPHONE HCL 1 MG/ML IJ SOLN: 0.2500 mg | INTRAMUSCULAR | Status: DC | PRN

## 2014-09-18 MED ORDER — GLYCOPYRROLATE 0.2 MG/ML IJ SOLN
INTRAMUSCULAR | Status: AC
  Filled 2014-09-18: qty 2

## 2014-09-18 MED ORDER — FENTANYL CITRATE 0.05 MG/ML IJ SOLN
INTRAMUSCULAR | Status: AC
  Filled 2014-09-18: qty 5

## 2014-09-18 MED ORDER — LACTATED RINGERS IV SOLN
INTRAVENOUS | Status: DC | PRN
  Administered 2014-09-18 – 2017-12-07 (×2): via INTRAVENOUS

## 2014-09-18 MED ORDER — NEOSTIGMINE METHYLSULFATE 10 MG/10ML IV SOLN
INTRAVENOUS | Status: AC
  Filled 2014-09-18: qty 1

## 2014-09-18 MED ORDER — MIDAZOLAM HCL 2 MG/2ML IJ SOLN
INTRAMUSCULAR | Status: AC
  Filled 2014-09-18: qty 2

## 2014-09-18 SURGICAL SUPPLY — 57 items
APL SKNCLS STERI-STRIP NONHPOA (GAUZE/BANDAGES/DRESSINGS)
BENZOIN TINCTURE PRP APPL 2/3 (GAUZE/BANDAGES/DRESSINGS) IMPLANT
BIT DRILL NEURO 2X3.1 SFT TUCH (MISCELLANEOUS) ×2 IMPLANT
BLADE CLIPPER SURG (BLADE) IMPLANT
BUR ROUND FLUTED 5 RND (BURR) ×3 IMPLANT
CANISTER SUCT 3000ML (MISCELLANEOUS) ×3 IMPLANT
CONT SPEC 4OZ CLIKSEAL STRL BL (MISCELLANEOUS) ×3 IMPLANT
DECANTER SPIKE VIAL GLASS SM (MISCELLANEOUS) ×3 IMPLANT
DRAPE LAPAROTOMY 100X72X124 (DRAPES) ×3 IMPLANT
DRAPE MICROSCOPE LEICA (MISCELLANEOUS) ×3 IMPLANT
DRAPE POUCH INSTRU U-SHP 10X18 (DRAPES) ×3 IMPLANT
DRAPE SURG 17X23 STRL (DRAPES) ×3 IMPLANT
DRILL NEURO 2X3.1 SOFT TOUCH (MISCELLANEOUS) ×2
DRSG TELFA 3X8 NADH (GAUZE/BANDAGES/DRESSINGS) IMPLANT
DURAPREP 26ML APPLICATOR (WOUND CARE) ×3 IMPLANT
ELECT REM PT RETURN 9FT ADLT (ELECTROSURGICAL) ×2
ELECTRODE REM PT RTRN 9FT ADLT (ELECTROSURGICAL) ×2 IMPLANT
GAUZE SPONGE 4X4 12PLY STRL (GAUZE/BANDAGES/DRESSINGS) IMPLANT
GAUZE SPONGE 4X4 16PLY XRAY LF (GAUZE/BANDAGES/DRESSINGS) IMPLANT
GLOVE BIO SURGEON STRL SZ8 (GLOVE) ×3 IMPLANT
GLOVE BIOGEL PI IND STRL 8 (GLOVE) ×2 IMPLANT
GLOVE BIOGEL PI IND STRL 8.5 (GLOVE) ×2 IMPLANT
GLOVE BIOGEL PI INDICATOR 8 (GLOVE) ×1
GLOVE BIOGEL PI INDICATOR 8.5 (GLOVE) ×1
GLOVE ECLIPSE 8.0 STRL XLNG CF (GLOVE) ×3 IMPLANT
GLOVE EXAM NITRILE LRG STRL (GLOVE) IMPLANT
GLOVE EXAM NITRILE MD LF STRL (GLOVE) IMPLANT
GLOVE EXAM NITRILE XL STR (GLOVE) IMPLANT
GLOVE EXAM NITRILE XS STR PU (GLOVE) IMPLANT
GOWN STRL REUS W/ TWL LRG LVL3 (GOWN DISPOSABLE) IMPLANT
GOWN STRL REUS W/ TWL XL LVL3 (GOWN DISPOSABLE) ×2 IMPLANT
GOWN STRL REUS W/TWL 2XL LVL3 (GOWN DISPOSABLE) ×3 IMPLANT
GOWN STRL REUS W/TWL LRG LVL3 (GOWN DISPOSABLE)
GOWN STRL REUS W/TWL XL LVL3 (GOWN DISPOSABLE) ×2
KIT BASIN OR (CUSTOM PROCEDURE TRAY) ×3 IMPLANT
KIT ROOM TURNOVER OR (KITS) ×3 IMPLANT
LIQUID BAND (GAUZE/BANDAGES/DRESSINGS) ×3 IMPLANT
NDL HYPO 18GX1.5 BLUNT FILL (NEEDLE) IMPLANT
NDL HYPO 25X1 1.5 SAFETY (NEEDLE) ×1 IMPLANT
NEEDLE HYPO 18GX1.5 BLUNT FILL (NEEDLE) IMPLANT
NEEDLE HYPO 25X1 1.5 SAFETY (NEEDLE) ×2 IMPLANT
NS IRRIG 1000ML POUR BTL (IV SOLUTION) ×3 IMPLANT
PACK LAMINECTOMY NEURO (CUSTOM PROCEDURE TRAY) ×3 IMPLANT
PAD ARMBOARD 7.5X6 YLW CONV (MISCELLANEOUS) ×9 IMPLANT
PAD DRESSING TELFA 3X8 NADH (GAUZE/BANDAGES/DRESSINGS) IMPLANT
RUBBERBAND STERILE (MISCELLANEOUS) ×6 IMPLANT
SPONGE SURGIFOAM ABS GEL SZ50 (HEMOSTASIS) ×3 IMPLANT
STRIP CLOSURE SKIN 1/2X4 (GAUZE/BANDAGES/DRESSINGS) IMPLANT
SUT VIC AB 0 CT1 18XCR BRD8 (SUTURE) ×2 IMPLANT
SUT VIC AB 0 CT1 8-18 (SUTURE) ×2
SUT VIC AB 2-0 CT1 18 (SUTURE) ×3 IMPLANT
SUT VIC AB 3-0 SH 8-18 (SUTURE) ×3 IMPLANT
SYR 20ML ECCENTRIC (SYRINGE) ×3 IMPLANT
SYR 5ML LL (SYRINGE) IMPLANT
TOWEL OR 17X24 6PK STRL BLUE (TOWEL DISPOSABLE) ×3 IMPLANT
TOWEL OR 17X26 10 PK STRL BLUE (TOWEL DISPOSABLE) ×3 IMPLANT
WATER STERILE IRR 1000ML POUR (IV SOLUTION) ×3 IMPLANT

## 2014-09-18 NOTE — H&P (Signed)
> [address] [place], [place] [address] Phone: [phone]   Patient ID:   [id number] Patient: Hannah HaggisLisa Lynch  Date of Birth: [birth year] Visit Type: Office Visit   Date: 08/23/2014 01:45 PM Provider: Danae OrleansJoseph D. Venetia MaxonStern MD   This 43 year old female presents for Follow Up of back pain.  History of Present Illness: 1.  Follow Up of back pain  Hannah HaggisLisa Schaberg returns reporting increased lumbar and now bilateral leg pain since falling Sept 16th.  She states her left leg gave way, causing her to fall.  Norco 5/325 2-3/day  X-ray on [facility]  Patient has having pain to her right knee and also into the foot on the left.  She says her leg pain has got a lot worse.  She complains of left buttock pain and has positive straight leg raise right greater than left.  I reviewed the patient's MRI which shows significant disc herniation at L3 L4 on the left.  She says that she has not received any relief after injection and she says her leg gave way on 16 September.  She says the severity of her pain is causing her significant incapacity.  I reviewed her MRI with her and this shows a significant disc herniation at L3-4 on the left with left-sided nerve root compression.  I have recommended proceeding with microdiscectomy at L3 L4 on the left.  She wishes to do so.      Medical/Surgical/Interim History Reviewed, no change.  Last detailed document date:02/20/2014.   PAST MEDICAL HISTORY, SURGICAL HISTORY, FAMILY HISTORY, SOCIAL HISTORY AND REVIEW OF SYSTEMS I have reviewed the patient's past medical, surgical, family and social history as well as the comprehensive review of systems as included on the [facility] history form dated 05/04/2014, which I have signed.  Family History: Reviewed, no changes.  Last detailed document: 02/20/2014.   Social History: Tobacco use reviewed. Reviewed, no changes. Last detailed document date: 02/20/2014.       MEDICATIONS(added, continued or stopped this visit):   Started Medication Directions Instruction Stopped   Ativan 0.5 mg tablet take 2 tablet by oral route 3 times every day as needed     estradiol 2 mg tablet take 1 tablet by oral route  every day     Flexeril 10 mg tablet take 1 tablet by oral route  every day     Lexapro 20 mg tablet take 1 tablet by oral route  every day    04/26/2014 Mobic 15 mg tablet take 1 tablet by oral route  every day    07/31/2014 Norco 5 mg-325 mg tablet take 1 tablet by oral route  every 6 hours as needed for pain  08/23/2014  08/23/2014 Norco 5 mg-325 mg tablet take 1 tablet by oral route  every 6 hours as needed for pain     prednisone 20 mg tablet take 1 tablet by oral route  every day    03/07/2014 tramadol 50 mg tablet take 1 tablet by oral route  every 6 hours as needed      ALLERGIES:  Ingredient Reaction Medication Name Comment  NO KNOWN ALLERGIES     No known allergies.    Vitals Date Temp F BP Pulse Ht In Wt Lb BMI BSA Pain Score  08/23/2014  130/88 78 65 209 34.78  7/10        IMPRESSION Progressive radiculopathy without relief with injections or therapy.  Patient wishes to proceed with surgery  Completed  Orders (this encounter) Order Details Reason Side Interpretation Result Initial Treatment Date Region  Lifestyle education regarding diet Encouraged to eat a well balanced diet and follow up with primary care physician.        Lumbar Spine- AP/Lat/Obls/Spot/Flex/Ex      08/23/2014 All Levels to All Levels   Assessment/Plan # Detail Type Description   1. 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       2. Assessment Body mass index (BMI) 34.0-34.9, adult (Z68.34).   Plan Orders Today's instructions / counseling include(s) Lifestyle education regarding diet.       3. Assessment Acute lumbar radiculopathy (M54.16).         Pain Assessment/Treatment Pain Scale: 7/10. Method:  Numeric Pain Intensity Scale. Location: back. Onset: 02/06/2014. Duration: varies. Quality: discomforting. Pain Assessment/Treatment follow-up plan of care: Patient is currently taking medication for pain as prescribed..  Left L3 L4 microdiscectomy to be performed on 09/19/14.  Risks and benefits were discussed in detail with patient and she wishes to proceed.  Orders: Diagnostic Procedures: Assessment Procedure  M51.26 Microdiscectomy - left - L3-L4  M54.16 Lumbar Spine- AP/Lat/Obls/Spot/Flex/Ex  Instruction(s)/Education: Assessment Instruction  Z68.34 Lifestyle education regarding diet    MEDICATIONS PRESCRIBED TODAY    Rx Quantity Refills  NORCO 5 mg-325 mg  60 0            Provider:  Danae OrleansJoseph D. Venetia MaxonStern MD  08/27/2014 07:46 PM Dictation edited by: Danae OrleansJoseph D. Venetia MaxonStern    CC Providers: Maeola HarmanJoseph Stern MD [address] [place], [place] [address] ----------------------------------------------------------------------------------------------------------------------------------------------------------------------         Electronically signed by Danae OrleansJoseph D. Venetia MaxonStern MD on 08/27/2014 07:46 PM

## 2014-09-18 NOTE — Anesthesia Preprocedure Evaluation (Addendum)
Anesthesia Evaluation  Patient identified by MRN, date of birth, ID band Patient awake    Reviewed: Allergy & Precautions, H&P , NPO status , Patient's Chart, lab work & pertinent test results  History of Anesthesia Complications (+) PONV  Airway Mallampati: I  TM Distance: >3 FB Neck ROM: Full    Dental  (+) Teeth Intact, Dental Advisory Given   Pulmonary  breath sounds clear to auscultation        Cardiovascular negative cardio ROS  Rhythm:Regular Rate:Normal     Neuro/Psych Anxiety    GI/Hepatic negative GI ROS, Neg liver ROS,   Endo/Other  negative endocrine ROS  Renal/GU negative Renal ROS     Musculoskeletal  (+) Arthritis -,   Abdominal   Peds  Hematology negative hematology ROS (+)   Anesthesia Other Findings   Reproductive/Obstetrics                           Anesthesia Physical Anesthesia Plan  ASA: I  Anesthesia Plan: General   Post-op Pain Management:    Induction: Intravenous  Airway Management Planned: Oral ETT  Additional Equipment:   Intra-op Plan:   Post-operative Plan: Extubation in OR  Informed Consent: I have reviewed the patients History and Physical, chart, labs and discussed the procedure including the risks, benefits and alternatives for the proposed anesthesia with the patient or authorized representative who has indicated his/her understanding and acceptance.   Dental advisory given  Plan Discussed with: CRNA and Surgeon  Anesthesia Plan Comments:         Anesthesia Quick Evaluation

## 2014-09-18 NOTE — Interval H&P Note (Signed)
History and Physical Interval Note:  09/18/2014 9:34 AM  Hannah Lynch  has presented today for surgery, with the diagnosis of Displacement of lumbar intervertebral disc without myelopathy  The various methods of treatment have been discussed with the patient and family. After consideration of risks, benefits and other options for treatment, the patient has consented to  Procedure(s): CANCELLED PROCEDURE as a surgical intervention .  The patient's history has been reviewed, patient examined, no change in status, stable for surgery.  I have reviewed the patient's chart and labs.  Questions were answered to the patient's satisfaction.     STERN,JOSEPH D  The patient underwent a new lumbar MRI on 09/15/2014 for worsening back and bilateral lower extremity pain.  Her pain is worse on the left than the right side, but both are quite severe.  Her new MRI is significantly different than her previous one.  The L 34 disc herniation has involuted and there is less caudally migrated disc material.  At the same time, the L 45 level is significantly worse.  She has bilateral lateral recess stenosis with a broad disc herniation at the L 45 level and recurrent disc herniation on the left at the site of prior discectomy.  She has marked facet arthropathy at this level.  At the L 5 S 1 level, there is an annular rent without herniation or nerve root compression.  In light of these changes, I have recommended a change in surgical plan to the patient.  This will consist of redo L 45 decompression with L 45 PLIF fusion using MAS PLIF technique.  This will need to be scheduled and therefore the current surgery has been cancelled.  Her new surgery has been scheduled for 09/25/14.  Patient is aware of risks and benefits of this surgical approach and wishes to proceed.  A bilateral discectomy would not be an option at this level, given the severity of degenerative changes with laterally based disc herniation recurrence at L 45 on  the left.  I have therefore recommended procedding with fusion rather than bilateral discectomy and redo discectomy at the L 45 level.  With the amount of decompression required, patient's spine would be rendered unstable, thereby necessitating concommittant fusion at this level.

## 2014-09-22 ENCOUNTER — Encounter (HOSPITAL_COMMUNITY): Payer: Self-pay | Admitting: *Deleted

## 2014-09-22 ENCOUNTER — Other Ambulatory Visit: Payer: Self-pay | Admitting: Neurosurgery

## 2014-09-22 NOTE — Progress Notes (Signed)
No pre-op orders in EPIC. Called Dr. Fredrich BirksStern's office and spoke with Shanda BumpsJessica, requested orders.

## 2014-09-24 MED ORDER — CEFAZOLIN SODIUM-DEXTROSE 2-3 GM-% IV SOLR
2.0000 g | INTRAVENOUS | Status: AC
  Administered 2014-09-25: 2 g via INTRAVENOUS
  Filled 2014-09-24: qty 50

## 2014-09-25 ENCOUNTER — Inpatient Hospital Stay (HOSPITAL_COMMUNITY): Payer: Self-pay

## 2014-09-25 ENCOUNTER — Encounter (HOSPITAL_COMMUNITY): Payer: Self-pay | Admitting: Certified Registered"

## 2014-09-25 ENCOUNTER — Inpatient Hospital Stay (HOSPITAL_COMMUNITY): Payer: Self-pay | Admitting: Certified Registered"

## 2014-09-25 ENCOUNTER — Inpatient Hospital Stay (HOSPITAL_COMMUNITY)
Admission: RE | Admit: 2014-09-25 | Discharge: 2014-09-26 | DRG: 459 | Disposition: A | Discharge status: Home / Self Care | Payer: Self-pay | Source: Ambulatory Visit | Attending: Neurosurgery | Admitting: Neurosurgery

## 2014-09-25 ENCOUNTER — Encounter (HOSPITAL_COMMUNITY)
Admission: RE | Disposition: A | Discharge status: Home / Self Care | Payer: Self-pay | Source: Ambulatory Visit | Attending: Neurosurgery

## 2014-09-25 DIAGNOSIS — G9519 Other vascular myelopathies: Secondary | ICD-10-CM | POA: Diagnosis present

## 2014-09-25 DIAGNOSIS — M5416 Radiculopathy, lumbar region: Secondary | ICD-10-CM

## 2014-09-25 DIAGNOSIS — M48062 Spinal stenosis, lumbar region with neurogenic claudication: Secondary | ICD-10-CM | POA: Diagnosis present

## 2014-09-25 DIAGNOSIS — M4806 Spinal stenosis, lumbar region: Secondary | ICD-10-CM | POA: Diagnosis present

## 2014-09-25 DIAGNOSIS — M5116 Intervertebral disc disorders with radiculopathy, lumbar region: Principal | ICD-10-CM | POA: Diagnosis present

## 2014-09-25 DIAGNOSIS — M5410 Radiculopathy, site unspecified: Secondary | ICD-10-CM

## 2014-09-25 HISTORY — PX: MAXIMUM ACCESS (MAS)POSTERIOR LUMBAR INTERBODY FUSION (PLIF) 1 LEVEL: SHX6368

## 2014-09-25 LAB — CBC
HCT: 40.1 % (ref 36.0–46.0)
Hemoglobin: 13.9 g/dL (ref 12.0–15.0)
MCH: 32.9 pg (ref 26.0–34.0)
MCHC: 34.7 g/dL (ref 30.0–36.0)
MCV: 95 fL (ref 78.0–100.0)
Platelets: 223 10*3/uL (ref 150–400)
RBC: 4.22 MIL/uL (ref 3.87–5.11)
RDW: 12.3 % (ref 11.5–15.5)
WBC: 7.7 10*3/uL (ref 4.0–10.5)

## 2014-09-25 LAB — TYPE AND SCREEN
ABO/RH(D): O POS
Antibody Screen: NEGATIVE

## 2014-09-25 LAB — ABO/RH: ABO/RH(D): O POS

## 2014-09-25 SURGERY — FOR MAXIMUM ACCESS (MAS) POSTERIOR LUMBAR INTERBODY FUSION (PLIF) 1 LEVEL
Anesthesia: General | Site: Spine Lumbar

## 2014-09-25 MED ORDER — MORPHINE SULFATE 2 MG/ML IJ SOLN: 1.0000 mg | INTRAMUSCULAR | Status: DC | PRN

## 2014-09-25 MED ORDER — CEFAZOLIN SODIUM 1-5 GM-% IV SOLN
1.0000 g | Freq: Three times a day (TID) | INTRAVENOUS | Status: AC
  Administered 2014-09-25 (×2): 1 g via INTRAVENOUS
  Filled 2014-09-25 (×2): qty 50

## 2014-09-25 MED ORDER — PROPOFOL 10 MG/ML IV BOLUS
INTRAVENOUS | Status: DC | PRN
  Administered 2014-09-25: 150 mg via INTRAVENOUS

## 2014-09-25 MED ORDER — MEPERIDINE HCL 25 MG/ML IJ SOLN: 6.2500 mg | INTRAMUSCULAR | Status: DC | PRN

## 2014-09-25 MED ORDER — LIDOCAINE HCL (CARDIAC) 20 MG/ML IV SOLN
INTRAVENOUS | Status: DC | PRN
  Administered 2014-09-25: 20 mg via INTRAVENOUS

## 2014-09-25 MED ORDER — KCL IN DEXTROSE-NACL 20-5-0.45 MEQ/L-%-% IV SOLN
INTRAVENOUS | Status: DC
  Filled 2014-09-25 (×3): qty 1000

## 2014-09-25 MED ORDER — OXYCODONE-ACETAMINOPHEN 5-325 MG PO TABS
1.0000 | ORAL_TABLET | ORAL | Status: DC | PRN
  Administered 2014-09-25 – 2014-09-26 (×4): 2 via ORAL
  Filled 2014-09-25 (×4): qty 2

## 2014-09-25 MED ORDER — LIDOCAINE HCL 4 % MT SOLN
OROMUCOSAL | Status: DC | PRN
  Administered 2014-09-25: 4 mL via TOPICAL

## 2014-09-25 MED ORDER — FLEET ENEMA 7-19 GM/118ML RE ENEM
1.0000 | ENEMA | Freq: Once | RECTAL | Status: AC | PRN
  Filled 2014-09-25: qty 1

## 2014-09-25 MED ORDER — LIDOCAINE HCL (CARDIAC) 20 MG/ML IV SOLN
INTRAVENOUS | Status: AC
  Filled 2014-09-25: qty 5

## 2014-09-25 MED ORDER — SODIUM CHLORIDE 0.9 % IJ SOLN: 3.0000 mL | INTRAMUSCULAR | Status: DC | PRN

## 2014-09-25 MED ORDER — EPHEDRINE SULFATE 50 MG/ML IJ SOLN
INTRAMUSCULAR | Status: AC
  Filled 2014-09-25: qty 1

## 2014-09-25 MED ORDER — SODIUM CHLORIDE 0.9 % IJ SOLN
INTRAMUSCULAR | Status: AC
  Filled 2014-09-25: qty 10

## 2014-09-25 MED ORDER — ONDANSETRON HCL 4 MG/2ML IJ SOLN
4.0000 mg | INTRAMUSCULAR | Status: DC | PRN
  Administered 2014-09-25: 4 mg via INTRAVENOUS
  Filled 2014-09-25: qty 2

## 2014-09-25 MED ORDER — METHOCARBAMOL 500 MG PO TABS
500.0000 mg | ORAL_TABLET | Freq: Four times a day (QID) | ORAL | Status: DC | PRN
  Administered 2014-09-25: 500 mg via ORAL
  Filled 2014-09-25: qty 1

## 2014-09-25 MED ORDER — ALUM & MAG HYDROXIDE-SIMETH 200-200-20 MG/5ML PO SUSP: 30.0000 mL | Freq: Four times a day (QID) | ORAL | Status: DC | PRN

## 2014-09-25 MED ORDER — ACETAMINOPHEN 325 MG PO TABS: 650.0000 mg | ORAL_TABLET | ORAL | Status: DC | PRN

## 2014-09-25 MED ORDER — SCOPOLAMINE 1 MG/3DAYS TD PT72
MEDICATED_PATCH | TRANSDERMAL | Status: AC
  Administered 2014-09-25: 1 via TRANSDERMAL
  Filled 2014-09-25: qty 1

## 2014-09-25 MED ORDER — MIDAZOLAM HCL 5 MG/5ML IJ SOLN
INTRAMUSCULAR | Status: DC | PRN
  Administered 2014-09-25: 2 mg via INTRAVENOUS

## 2014-09-25 MED ORDER — MIDAZOLAM HCL 2 MG/2ML IJ SOLN: 0.5000 mg | Freq: Once | INTRAMUSCULAR | Status: DC | PRN

## 2014-09-25 MED ORDER — PANTOPRAZOLE SODIUM 40 MG IV SOLR
40.0000 mg | Freq: Every day | INTRAVENOUS | Status: DC
  Filled 2014-09-25: qty 40

## 2014-09-25 MED ORDER — ARTIFICIAL TEARS OP OINT
TOPICAL_OINTMENT | OPHTHALMIC | Status: DC | PRN
  Administered 2014-09-25: 1 via OPHTHALMIC

## 2014-09-25 MED ORDER — HYDROMORPHONE HCL 1 MG/ML IJ SOLN
INTRAMUSCULAR | Status: AC
  Administered 2014-09-25: 0.5 mg via INTRAVENOUS
  Filled 2014-09-25: qty 1

## 2014-09-25 MED ORDER — HYDROCODONE-ACETAMINOPHEN 5-325 MG PO TABS: 1.0000 | ORAL_TABLET | Freq: Four times a day (QID) | ORAL | Status: DC | PRN

## 2014-09-25 MED ORDER — ESCITALOPRAM OXALATE 20 MG PO TABS
20.0000 mg | ORAL_TABLET | Freq: Every day | ORAL | Status: DC
  Filled 2014-09-25: qty 1

## 2014-09-25 MED ORDER — ONDANSETRON HCL 4 MG/2ML IJ SOLN
INTRAMUSCULAR | Status: DC | PRN
  Administered 2014-09-25: 4 mg via INTRAVENOUS

## 2014-09-25 MED ORDER — MENTHOL 3 MG MT LOZG: 1.0000 | LOZENGE | OROMUCOSAL | Status: DC | PRN

## 2014-09-25 MED ORDER — SUCCINYLCHOLINE CHLORIDE 20 MG/ML IJ SOLN
INTRAMUSCULAR | Status: AC
  Filled 2014-09-25: qty 1

## 2014-09-25 MED ORDER — SUCCINYLCHOLINE CHLORIDE 20 MG/ML IJ SOLN
INTRAMUSCULAR | Status: DC | PRN
  Administered 2014-09-25: 120 mg via INTRAVENOUS

## 2014-09-25 MED ORDER — DOCUSATE SODIUM 100 MG PO CAPS
100.0000 mg | ORAL_CAPSULE | Freq: Two times a day (BID) | ORAL | Status: DC
  Administered 2014-09-25 (×2): 100 mg via ORAL
  Filled 2014-09-25 (×4): qty 1

## 2014-09-25 MED ORDER — PHENYLEPHRINE HCL 10 MG/ML IJ SOLN
INTRAMUSCULAR | Status: DC | PRN
  Administered 2014-09-25 (×2): 40 ug via INTRAVENOUS
  Administered 2014-09-25: 80 ug via INTRAVENOUS

## 2014-09-25 MED ORDER — LACTATED RINGERS IV SOLN
INTRAVENOUS | Status: DC | PRN
  Administered 2014-09-25 (×2): via INTRAVENOUS

## 2014-09-25 MED ORDER — ARTIFICIAL TEARS OP OINT
TOPICAL_OINTMENT | OPHTHALMIC | Status: AC
  Filled 2014-09-25: qty 3.5

## 2014-09-25 MED ORDER — SENNA 8.6 MG PO TABS
1.0000 | ORAL_TABLET | Freq: Two times a day (BID) | ORAL | Status: DC
  Administered 2014-09-25 (×2): 8.6 mg via ORAL
  Filled 2014-09-25 (×4): qty 1

## 2014-09-25 MED ORDER — HYDROMORPHONE HCL 1 MG/ML IJ SOLN
0.2500 mg | INTRAMUSCULAR | Status: DC | PRN
  Administered 2014-09-25 (×4): 0.5 mg via INTRAVENOUS

## 2014-09-25 MED ORDER — HYDROCODONE-ACETAMINOPHEN 5-325 MG PO TABS: 1.0000 | ORAL_TABLET | ORAL | Status: DC | PRN

## 2014-09-25 MED ORDER — LORAZEPAM 0.5 MG PO TABS: 0.5000 mg | ORAL_TABLET | Freq: Three times a day (TID) | ORAL | Status: DC | PRN

## 2014-09-25 MED ORDER — ROCURONIUM BROMIDE 50 MG/5ML IV SOLN
INTRAVENOUS | Status: AC
  Filled 2014-09-25: qty 1

## 2014-09-25 MED ORDER — ZOLPIDEM TARTRATE 5 MG PO TABS: 5.0000 mg | ORAL_TABLET | Freq: Every evening | ORAL | Status: DC | PRN

## 2014-09-25 MED ORDER — OXYCODONE HCL 5 MG PO TABS: 5.0000 mg | ORAL_TABLET | Freq: Once | ORAL | Status: DC | PRN

## 2014-09-25 MED ORDER — OXYCODONE HCL 5 MG/5ML PO SOLN: 5.0000 mg | Freq: Once | ORAL | Status: DC | PRN

## 2014-09-25 MED ORDER — FENTANYL CITRATE 0.05 MG/ML IJ SOLN
INTRAMUSCULAR | Status: AC
  Filled 2014-09-25: qty 5

## 2014-09-25 MED ORDER — LIDOCAINE-EPINEPHRINE 1 %-1:100000 IJ SOLN
INTRAMUSCULAR | Status: DC | PRN
  Administered 2014-09-25: 5 mL

## 2014-09-25 MED ORDER — BUPIVACAINE HCL (PF) 0.5 % IJ SOLN
INTRAMUSCULAR | Status: DC | PRN
  Administered 2014-09-25: 5 mL

## 2014-09-25 MED ORDER — DIPHENHYDRAMINE HCL 50 MG/ML IJ SOLN
10.0000 mg | Freq: Once | INTRAMUSCULAR | Status: DC
Start: 2014-09-25 — End: 2014-09-25

## 2014-09-25 MED ORDER — BISACODYL 10 MG RE SUPP: 10.0000 mg | Freq: Every day | RECTAL | Status: DC | PRN

## 2014-09-25 MED ORDER — SODIUM CHLORIDE 0.9 % IV SOLN: 250.0000 mL | INTRAVENOUS | Status: DC

## 2014-09-25 MED ORDER — 0.9 % SODIUM CHLORIDE (POUR BTL) OPTIME
TOPICAL | Status: DC | PRN
  Administered 2014-09-25: 1000 mL

## 2014-09-25 MED ORDER — FENTANYL CITRATE 0.05 MG/ML IJ SOLN
INTRAMUSCULAR | Status: DC | PRN
  Administered 2014-09-25 (×3): 50 ug via INTRAVENOUS
  Administered 2014-09-25: 250 ug via INTRAVENOUS

## 2014-09-25 MED ORDER — PHENYLEPHRINE HCL 10 MG/ML IJ SOLN
10.0000 mg | INTRAVENOUS | Status: DC | PRN
  Administered 2014-09-25: 25 ug/min via INTRAVENOUS

## 2014-09-25 MED ORDER — POLYETHYLENE GLYCOL 3350 17 G PO PACK
17.0000 g | PACK | Freq: Every day | ORAL | Status: DC | PRN
  Filled 2014-09-25: qty 1

## 2014-09-25 MED ORDER — PROMETHAZINE HCL 25 MG/ML IJ SOLN: 6.2500 mg | INTRAMUSCULAR | Status: DC | PRN

## 2014-09-25 MED ORDER — THROMBIN 20000 UNITS EX SOLR
CUTANEOUS | Status: DC | PRN
  Administered 2014-09-25: 09:00:00 via TOPICAL

## 2014-09-25 MED ORDER — MIDAZOLAM HCL 2 MG/2ML IJ SOLN
INTRAMUSCULAR | Status: AC
  Filled 2014-09-25: qty 2

## 2014-09-25 MED ORDER — ONDANSETRON HCL 4 MG/2ML IJ SOLN
INTRAMUSCULAR | Status: AC
  Filled 2014-09-25: qty 2

## 2014-09-25 MED ORDER — PHENYLEPHRINE HCL 10 MG/ML IJ SOLN
INTRAMUSCULAR | Status: AC
  Filled 2014-09-25: qty 1

## 2014-09-25 MED ORDER — SODIUM CHLORIDE 0.9 % IJ SOLN
3.0000 mL | Freq: Two times a day (BID) | INTRAMUSCULAR | Status: DC
  Administered 2014-09-25: 3 mL via INTRAVENOUS

## 2014-09-25 MED ORDER — PHENOL 1.4 % MT LIQD: 1.0000 | OROMUCOSAL | Status: DC | PRN

## 2014-09-25 MED ORDER — PROPOFOL 10 MG/ML IV BOLUS
INTRAVENOUS | Status: AC
  Filled 2014-09-25: qty 20

## 2014-09-25 MED ORDER — ACETAMINOPHEN 650 MG RE SUPP: 650.0000 mg | RECTAL | Status: DC | PRN

## 2014-09-25 MED ORDER — ESTRADIOL 2 MG PO TABS
2.0000 mg | ORAL_TABLET | Freq: Every day | ORAL | Status: DC
  Filled 2014-09-25: qty 1

## 2014-09-25 MED ORDER — DEXTROSE 5 % IV SOLN
500.0000 mg | Freq: Four times a day (QID) | INTRAVENOUS | Status: DC | PRN
  Filled 2014-09-25: qty 5

## 2014-09-25 MED ORDER — PANTOPRAZOLE SODIUM 40 MG PO TBEC
40.0000 mg | DELAYED_RELEASE_TABLET | Freq: Every day | ORAL | Status: DC
  Administered 2014-09-25: 40 mg via ORAL

## 2014-09-25 SURGICAL SUPPLY — 91 items
ADH SKN CLS APL DERMABOND .7 (GAUZE/BANDAGES/DRESSINGS) ×1
APL SKNCLS STERI-STRIP NONHPOA (GAUZE/BANDAGES/DRESSINGS)
BENZOIN TINCTURE PRP APPL 2/3 (GAUZE/BANDAGES/DRESSINGS) ×1 IMPLANT
BLADE CLIPPER SURG (BLADE) IMPLANT
BONE MATRIX OSTEOCEL PRO MED (Bone Implant) ×1 IMPLANT
BUR MATCHSTICK NEURO 3.0 LAGG (BURR) ×2 IMPLANT
BUR ROUND FLUTED 5 RND (BURR) ×1 IMPLANT
CAGE COROENT MP 8X9X23M-8 SPIN (Cage) ×2 IMPLANT
CANISTER SUCT 3000ML (MISCELLANEOUS) ×2 IMPLANT
CLIP NEUROVISION LG (CLIP) ×1 IMPLANT
CONT SPEC 4OZ CLIKSEAL STRL BL (MISCELLANEOUS) ×4 IMPLANT
COVER BACK TABLE 24X17X13 BIG (DRAPES) IMPLANT
COVER BACK TABLE 60X90IN (DRAPES) ×2 IMPLANT
DECANTER SPIKE VIAL GLASS SM (MISCELLANEOUS) ×2 IMPLANT
DERMABOND ADVANCED (GAUZE/BANDAGES/DRESSINGS) ×1
DERMABOND ADVANCED .7 DNX12 (GAUZE/BANDAGES/DRESSINGS) IMPLANT
DRAPE C-ARM 42X72 X-RAY (DRAPES) ×2 IMPLANT
DRAPE C-ARMOR (DRAPES) ×2 IMPLANT
DRAPE LAPAROTOMY 100X72X124 (DRAPES) ×2 IMPLANT
DRAPE POUCH INSTRU U-SHP 10X18 (DRAPES) ×2 IMPLANT
DRAPE SURG 17X23 STRL (DRAPES) ×1 IMPLANT
DRSG OPSITE POSTOP 4X6 (GAUZE/BANDAGES/DRESSINGS) ×1 IMPLANT
DRSG TELFA 3X8 NADH (GAUZE/BANDAGES/DRESSINGS) IMPLANT
DURAPREP 26ML APPLICATOR (WOUND CARE) ×2 IMPLANT
ELECT BLADE 4.0 EZ CLEAN MEGAD (MISCELLANEOUS) ×2
ELECT REM PT RETURN 9FT ADLT (ELECTROSURGICAL) ×2
ELECTRODE BLDE 4.0 EZ CLN MEGD (MISCELLANEOUS) IMPLANT
ELECTRODE REM PT RTRN 9FT ADLT (ELECTROSURGICAL) ×1 IMPLANT
EVACUATOR 1/8 PVC DRAIN (DRAIN) ×1 IMPLANT
GAUZE SPONGE 4X4 12PLY STRL (GAUZE/BANDAGES/DRESSINGS) ×1 IMPLANT
GAUZE SPONGE 4X4 16PLY XRAY LF (GAUZE/BANDAGES/DRESSINGS) IMPLANT
GLOVE BIO SURGEON STRL SZ8 (GLOVE) ×2 IMPLANT
GLOVE BIO SURGEON STRL SZ8.5 (GLOVE) ×1 IMPLANT
GLOVE BIOGEL PI IND STRL 7.0 (GLOVE) IMPLANT
GLOVE BIOGEL PI IND STRL 8 (GLOVE) ×1 IMPLANT
GLOVE BIOGEL PI IND STRL 8.5 (GLOVE) ×1 IMPLANT
GLOVE BIOGEL PI INDICATOR 7.0 (GLOVE) ×2
GLOVE BIOGEL PI INDICATOR 8 (GLOVE) ×1
GLOVE BIOGEL PI INDICATOR 8.5 (GLOVE) ×1
GLOVE ECLIPSE 8.0 STRL XLNG CF (GLOVE) ×2 IMPLANT
GLOVE EXAM NITRILE LRG STRL (GLOVE) IMPLANT
GLOVE EXAM NITRILE MD LF STRL (GLOVE) IMPLANT
GLOVE EXAM NITRILE XL STR (GLOVE) IMPLANT
GLOVE EXAM NITRILE XS STR PU (GLOVE) IMPLANT
GLOVE SS BIOGEL STRL SZ 8 (GLOVE) IMPLANT
GLOVE SUPERSENSE BIOGEL SZ 8 (GLOVE) ×1
GOWN STRL REUS W/ TWL LRG LVL3 (GOWN DISPOSABLE) IMPLANT
GOWN STRL REUS W/ TWL XL LVL3 (GOWN DISPOSABLE) ×1 IMPLANT
GOWN STRL REUS W/TWL 2XL LVL3 (GOWN DISPOSABLE) ×3 IMPLANT
GOWN STRL REUS W/TWL LRG LVL3 (GOWN DISPOSABLE)
GOWN STRL REUS W/TWL XL LVL3 (GOWN DISPOSABLE) ×6
KIT BASIN OR (CUSTOM PROCEDURE TRAY) ×2 IMPLANT
KIT NDL NVM5 EMG ELECT (KITS) IMPLANT
KIT NEEDLE NVM5 EMG ELECT (KITS) ×1 IMPLANT
KIT NEEDLE NVM5 EMG ELECTRODE (KITS) ×1
KIT POSITION SURG JACKSON T1 (MISCELLANEOUS) ×2 IMPLANT
KIT ROOM TURNOVER OR (KITS) ×2 IMPLANT
LIQUID BAND (GAUZE/BANDAGES/DRESSINGS) ×2 IMPLANT
MILL MEDIUM DISP (BLADE) ×2 IMPLANT
NDL HYPO 25X1 1.5 SAFETY (NEEDLE) ×1 IMPLANT
NDL SPNL 18GX3.5 QUINCKE PK (NEEDLE) IMPLANT
NEEDLE HYPO 25X1 1.5 SAFETY (NEEDLE) ×2 IMPLANT
NEEDLE SPNL 18GX3.5 QUINCKE PK (NEEDLE) ×2 IMPLANT
NS IRRIG 1000ML POUR BTL (IV SOLUTION) ×2 IMPLANT
PACK LAMINECTOMY NEURO (CUSTOM PROCEDURE TRAY) ×2 IMPLANT
PAD ARMBOARD 7.5X6 YLW CONV (MISCELLANEOUS) ×5 IMPLANT
PAD DRESSING TELFA 3X8 NADH (GAUZE/BANDAGES/DRESSINGS) ×1 IMPLANT
PATTIES SURGICAL .5 X.5 (GAUZE/BANDAGES/DRESSINGS) IMPLANT
PATTIES SURGICAL .5 X1 (DISPOSABLE) IMPLANT
PATTIES SURGICAL 1X1 (DISPOSABLE) IMPLANT
ROD 35MM (Rod) ×2 IMPLANT
SCREW LOCK (Screw) ×8 IMPLANT
SCREW LOCK FXNS SPNE MAS PL (Screw) IMPLANT
SCREW PLIF MAS 5.0X35 (Screw) ×2 IMPLANT
SCREW SHANK 5.0X35 (Screw) ×2 IMPLANT
SCREW TULIP 5.5 (Screw) ×2 IMPLANT
SPONGE LAP 4X18 X RAY DECT (DISPOSABLE) IMPLANT
SPONGE SURGIFOAM ABS GEL 100 (HEMOSTASIS) ×2 IMPLANT
STAPLER SKIN PROX WIDE 3.9 (STAPLE) IMPLANT
STRIP CLOSURE SKIN 1/2X4 (GAUZE/BANDAGES/DRESSINGS) ×1 IMPLANT
SUT VIC AB 1 CT1 18XBRD ANBCTR (SUTURE) ×2 IMPLANT
SUT VIC AB 1 CT1 8-18 (SUTURE) ×2
SUT VIC AB 2-0 CT1 18 (SUTURE) ×3 IMPLANT
SUT VIC AB 3-0 SH 8-18 (SUTURE) ×3 IMPLANT
SYR 20ML ECCENTRIC (SYRINGE) ×2 IMPLANT
SYR 5ML LL (SYRINGE) IMPLANT
TOWEL OR 17X24 6PK STRL BLUE (TOWEL DISPOSABLE) ×2 IMPLANT
TOWEL OR 17X26 10 PK STRL BLUE (TOWEL DISPOSABLE) ×2 IMPLANT
TRAP SPECIMEN MUCOUS 40CC (MISCELLANEOUS) ×2 IMPLANT
TRAY FOLEY CATH 14FRSI W/METER (CATHETERS) ×2 IMPLANT
WATER STERILE IRR 1000ML POUR (IV SOLUTION) ×2 IMPLANT

## 2014-09-25 NOTE — Brief Op Note (Signed)
09/25/2014  10:56 AM  PATIENT:  Hannah MckusickLisa O Klinge  43 y.o. female  PRE-OPERATIVE DIAGNOSIS:  Displacement of lumbar intervertebral disc without myelopathy, spinal stenosis, lumbar radiculopathy, lumbago L 45 level  POST-OPERATIVE DIAGNOSIS:  Displacement of lumbar intervertebral disc without myelopathy, spinal stenosis, lumbar radiculopathy, lumbago L 45 level   PROCEDURE:  Procedure(s): Lumbar four-five MAXIMUM ACCESS (MAS) POSTERIOR LUMBAR INTERBODY FUSION (PLIF) 1 LEVEL (N/A) with redo laminectomy, PLIF, pedicle screw fixation, posterolateral arthrodesis  Decompression greater than for standard PLIF procedure, including redo laminectomy and decompression of neural elements  SURGEON:  Surgeon(s) and Role:    * Maeola HarmanJoseph Stern, MD - Primary    * Tressie StalkerJeffrey Jenkins, MD - Assisting  PHYSICIAN ASSISTANT:   ASSISTANTS: Poteat, RN   ANESTHESIA:   general  EBL:  Total I/O In: 1000 [I.V.:1000] Out: 685 [Urine:585; Blood:100]  BLOOD ADMINISTERED:none  DRAINS: none   LOCAL MEDICATIONS USED:  LIDOCAINE   SPECIMEN:  No Specimen  DISPOSITION OF SPECIMEN:  N/A  COUNTS:  YES  TOURNIQUET:  * No tourniquets in log *  DICTATION: Patient is a 43 year old with spondylosis , stenosis, disc herniation and severe back and bilateral lower extremity pain at L4/5 levels of the lumbar spine. It was elected to take her to surgery for MASPLIF L 45 level with redo decompression with posterolateral arthrodesis.  Procedure:   Following uncomplicated induction of GETA, and placement of electrodes for neural monitoring, patient was turned into a prone position on the [place] tableand using AP  fluoroscopy the area of planned incision was marked, prepped with betadine scrub and Duraprep, then draped. Exposure was performed of facet joint complex at L 45 level and the MAS retractor was placed.5.0 x 35 mm cortical Nuvasive screws were placed at L 4 bilaterally according to standard landmarks using neural  monitoring.  A total laminectomy of L 4 was then performed with disarticulation of facets.  This bone was saved for grafting, combined with Osteocel after being run through bone mill and was placed in bone packing device.  Thorough discectomy was performed bilaterally at L 45 and the endplates were prepared for grafting.  This required painstaking microdissection through prior scar tissue on the left and on both sides, greater decompression than would typically be performed for standard PLIF procedure. 23 x 8 x 8 degree cages were placed in the interspace and positioning was confirmed with AP and lateral fluoroscopy.  10 cc of autograft/Osteocel was packed in the interspace medial to the second cage.   Remaining screws were placed at L 5 and 35 mm rods were placed.   And the screws were locked and torqued.Final Xrays showed well positioned implants and screw fixation. The posterolateral region was packed with remaining 10 cc of autograft on the left of midline. The wounds were irrigated and then closed with 1, 2-0 and 3-0 Vicryl stitches. Sterile occlusive dressing was placed with Dermabond. The patient was then extubated in the operating room and taken to recovery in stable and satisfactory condition having tolerated her operation well. Counts were correct at the end of the case.  PLAN OF CARE: Admit to inpatient   PATIENT DISPOSITION:  PACU - hemodynamically stable.   Delay start of Pharmacological VTE agent (>24hrs) due to surgical blood loss or risk of bleeding: yes

## 2014-09-25 NOTE — Interval H&P Note (Signed)
History and Physical Interval Note:  09/25/2014 7:24 AM  Hannah Lynch  has presented today for surgery, with the diagnosis of Displacement of lumbar intervertebral disc without myelopathy  The various methods of treatment have been discussed with the patient and family. After consideration of risks, benefits and other options for treatment, the patient has consented to  Procedure(s): Lumbar four-five MAXIMUM ACCESS (MAS) POSTERIOR LUMBAR INTERBODY FUSION (PLIF) 1 LEVEL (N/A) as a surgical intervention .  The patient's history has been reviewed, patient examined, no change in status, stable for surgery.  I have reviewed the patient's chart and labs.  Questions were answered to the patient's satisfaction.     STERN,JOSEPH D

## 2014-09-25 NOTE — Progress Notes (Signed)
Utilization review completed.  

## 2014-09-25 NOTE — Transfer of Care (Signed)
Immediate Anesthesia Transfer of Care Note  Patient: Hannah MckusickLisa O Lynch  Procedure(s) Performed: Procedure(s): Lumbar four-five MAXIMUM ACCESS (MAS) POSTERIOR LUMBAR INTERBODY FUSION (PLIF) 1 LEVEL (N/A)  Patient Location: PACU  Anesthesia Type:General  Level of Consciousness: awake, alert , oriented and sedated  Airway & Oxygen Therapy: Patient Spontanous Breathing and Patient connected to nasal cannula oxygen  Post-op Assessment: Report given to PACU RN, Post -op Vital signs reviewed and stable and Patient moving all extremities X 4  Post vital signs: Reviewed and stable  Complications: No apparent anesthesia complications

## 2014-09-25 NOTE — Progress Notes (Signed)
Awake, alert, conversant.  Strength full both lower extremities.  Doing well.

## 2014-09-25 NOTE — Anesthesia Procedure Notes (Signed)
Procedure Name: Intubation Date/Time: 09/25/2014 7:34 AM Performed by: Lanell MatarBAKER, KATHRYN M Pre-anesthesia Checklist: Patient being monitored, Suction available, Emergency Drugs available, Patient identified and Timeout performed Patient Re-evaluated:Patient Re-evaluated prior to inductionOxygen Delivery Method: Circle system utilized Preoxygenation: Pre-oxygenation with 100% oxygen Intubation Type: IV induction Ventilation: Mask ventilation without difficulty Laryngoscope Size: Miller and 2 Grade View: Grade I Tube type: Oral Tube size: 7.0 mm Number of attempts: 1 Airway Equipment and Method: Stylet Placement Confirmation: ETT inserted through vocal cords under direct vision,  breath sounds checked- equal and bilateral and positive ETCO2 Secured at: 21 cm Tube secured with: Tape Dental Injury: Teeth and Oropharynx as per pre-operative assessment

## 2014-09-25 NOTE — Progress Notes (Signed)
PT Cancellation Note  Patient Details Name: Hannah Lynch MRN: [id number] DOB: 05/09/1971   Cancelled Treatment:    Reason Eval/Treat Not Completed: Fatigue/lethargy limiting ability to participate.  Pt hasnt been back long from surgery.  Will check back in am. Thanks.     Hannah Lynch, Hannah Lynch 09/25/2014, 3:03 PM Entergy CorporationDawn White,PT Acute Rehabilitation [phone] (pager)

## 2014-09-25 NOTE — Evaluation (Addendum)
Occupational Therapy Evaluation Patient Details Name: Hannah Lynch MRN: [id number] DOB: 04/24/1971 Today's Date: 09/25/2014    History of Present Illness 43 y.o. s/p Lumbar four-five MAXIMUM ACCESS (MAS) POSTERIOR LUMBAR INTERBODY FUSION (PLIF) 1 LEVEL.   Clinical Impression   Pt s/p above. Pt requiring assist with ADLs, PTA. Feel pt will benefit from acute OT to increase independence prior to d/c. Pt wants to review information tomorrow.     Follow Up Recommendations  No OT follow up;Supervision - Intermittent    Equipment Recommendations  3 in 1 bedside comode    Recommendations for Other Services       Precautions / Restrictions Precautions Precautions: Back Precaution Booklet Issued: Yes (comment) Precaution Comments: educated on back precautions Required Braces or Orthoses: Spinal Brace Spinal Brace: Lumbar corset;Applied in sitting position Restrictions Weight Bearing Restrictions: No      Mobility Bed Mobility Overal bed mobility: Needs Assistance Bed Mobility: Rolling;Sidelying to Sit;Sit to Sidelying Rolling: Supervision Sidelying to sit: Supervision     Sit to sidelying: Supervision General bed mobility comments: cues to reinforce technique.  Transfers Overall transfer level: Needs assistance   Transfers: Sit to/from Stand Sit to Stand: Supervision;Min guard         General transfer comment: cues to reinforce technique.    Balance                                            ADL Overall ADL's : Needs assistance/impaired                 Upper Body Dressing : Set up;Supervision/safety;Sitting   Lower Body Dressing: Minimal assistance;With adaptive equipment;Sit to/from stand   Toilet Transfer: Min guard;Supervision/safety;Ambulation (bed/chair)   Toileting- Clothing Manipulation and Hygiene: Minimal assistance;Sit to/from stand       Functional mobility during ADLs: Min guard;Supervision/safety General ADL Comments:  Educated on AE for LB ADLs and discussed technique for LB ADLs. Practiced with reacher and sockaid. Educated on use of cup for oral care and placement of grooming items to avoid breaking precautions. Educated on back brace. Educated on positioning of pillows. Discussed squatting for LB dressing if clothing drops or could use reacher or sit all the way down. Educated on tub transfer technique and recommended someone be with her. Educated on safety tips (safe shoewear, sitting for most of LB ADLs, rugs). Discussed recommending 3 in 1. Educated on toilet aide for hygiene.     Vision                     Perception     Praxis      Pertinent Vitals/Pain Pain Assessment: 0-10 Pain Score: 5  Pain Location: back Pain Intervention(s): Repositioned;Monitored during session     Hand Dominance Right   Extremity/Trunk Assessment Upper Extremity Assessment Upper Extremity Assessment: Overall WFL for tasks assessed   Lower Extremity Assessment Lower Extremity Assessment: Defer to PT evaluation (reports weakness in LLE)       Communication Communication Communication: No difficulties   Cognition Arousal/Alertness: Awake/alert Behavior During Therapy: WFL for tasks assessed/performed Overall Cognitive Status: Within Functional Limits for tasks assessed                     General Comments       Exercises       Shoulder Instructions  Home Living Family/patient expects to be discharged to:: Private residence Living Arrangements: Spouse/significant other;Children Available Help at Discharge: Family;Available 24 hours/day Type of Home: House Home Access: Stairs to enter Entergy CorporationEntrance Stairs-Number of Steps: 1 Entrance Stairs-Rails: None Home Layout: Two level;Able to live on main level with bedroom/bathroom     Bathroom Shower/Tub: Chief Strategy OfficerTub/shower unit   Bathroom Toilet: Standard (sink close)     Home Equipment:  (access to walker and cane)          Prior  Functioning/Environment Level of Independence: Needs assistance    ADL's / Homemaking Assistance Needed: assist with LB ADLs        OT Diagnosis: Acute pain   OT Problem List: Decreased range of motion;Pain;Decreased strength;Decreased knowledge of use of DME or AE;Decreased knowledge of precautions   OT Treatment/Interventions: DME and/or AE instruction;Therapeutic activities;Patient/family education;Self-care/ADL training;Balance training    OT Goals(Current goals can be found in the care plan section) Acute Rehab OT Goals Patient Stated Goal: not stated OT Goal Formulation: With patient Time For Goal Achievement: 10/02/14 Potential to Achieve Goals: Good ADL Goals Pt Will Perform Lower Body Dressing: with modified independence;with adaptive equipment;sit to/from stand Pt Will Transfer to Toilet: with modified independence;ambulating Pt Will Perform Toileting - Clothing Manipulation and hygiene: with modified independence;sit to/from stand;with adaptive equipment Pt Will Perform Tub/Shower Transfer: Tub transfer;with supervision;ambulating;3 in 1 Additional ADL Goal #1: Pt will independently verbalize and demonstrate 3/3 back precautions.  OT Frequency: Min 2X/week   Barriers to D/C:            Co-evaluation              End of Session Equipment Utilized During Treatment: Gait belt;Back brace Nurse Communication: Other (comment) (saw pt up with OT)  Activity Tolerance: Patient tolerated treatment well Patient left: in chair;with call bell/phone within reach   Time: [id number] OT Time Calculation (min): 21 min Charges:  OT General Charges $OT Visit: 1 Procedure OT Evaluation $Initial OT Evaluation Tier I: 1 Procedure OT Treatments $Self Care/Home Management : 8-22 mins G-CodesEarlie Raveling:    Straub, Lindsey L OTR/L [phone] 09/25/2014, 4:39 PM

## 2014-09-25 NOTE — H&P (Signed)
> [address] [place], [place] [address] Phone: [phone]   Patient ID:   [id number] Patient: Hannah Lynch  Date of Birth: 1971-03-13 Visit Type: Consult   Date: 09/18/2014 09:43 AM Provider: Danae Orleans. Venetia Maxon MD   This 43 year old female presents for Back pain.  History of Present Illness: 1.  Back pain  The patient underwent a new lumbar MRI on 09/15/2014 for worsening back and bilateral lower extremity pain.  Her pain is worse on the left than the right side, but both are quite severe.  Her new MRI is significantly different than her previous one.  The L 34 disc herniation has involuted and there is less caudally migrated disc material.  At the same time, the L 45 level is significantly worse.  She has bilateral lateral recess stenosis with a broad disc herniation at the L 45 level and recurrent disc herniation on the left at the site of prior discectomy.  She has marked facet arthropathy at this level.  At the L 5 S 1 level, there is an annular rent without herniation or nerve root compression.  In light of these changes, I have recommended a change in surgical plan to the patient.  This will consist of redo L 45 decompression with L 45 PLIF fusion using MAS PLIF technique.  This will need to be scheduled and therefore the current surgery has been cancelled.  Her new surgery has been scheduled for 09/25/14.  Patient is aware of risks and benefits of this surgical approach and wishes to proceed.  A bilateral discectomy would not be an option at this level, given the severity of degenerative changes with laterally based disc herniation recurrence at L 45 on the left.  I have therefore recommended procedding with fusion rather than bilateral discectomy and redo discectomy at the L 45 level.  With the amount of decompression required, patient's spine would be rendered unstable, thereby necessitating concommittant fusion at this level.         MEDICATIONS(added, continued or  stopped this visit):   Started Medication Directions Instruction Stopped   Ativan 0.5 mg tablet take 2 tablet by oral route 3 times every day as needed     estradiol 2 mg tablet take 1 tablet by oral route  every day     Flexeril 10 mg tablet take 1 tablet by oral route  every day     Lexapro 20 mg tablet take 1 tablet by oral route  every day    04/26/2014 Mobic 15 mg tablet take 1 tablet by oral route  every day    08/23/2014 Norco 5 mg-325 mg tablet take 1 tablet by oral route  every 6 hours as needed for pain     prednisone 20 mg tablet take 1 tablet by oral route  every day    03/07/2014 tramadol 50 mg tablet take 1 tablet by oral route  every 6 hours as needed      ALLERGIES:                    Provider:  Danae Orleans. Venetia Maxon MD  09/18/2014 09:45 AM Dictation edited by: Danae Orleans. Venetia Maxon    CC Providers: Maeola Harman MD [address] [place], [place] [address] ----------------------------------------------------------------------------------------------------------------------------------------------------------------------         Electronically signed by Danae Orleans. Venetia Maxon MD on 09/22/2014 07:25 AM   [address] [place], [place] [address] Phone: [phone]   Patient ID:  [id number] Patient: Hannah Lynch  Date of Birth: [birth year] Visit Type: Office Visit   Date: 08/23/2014 01:45 PM Provider: Danae OrleansJoseph D. Venetia MaxonStern MD   This 43 year old female presents for Follow Up of back pain.  History of Present Illness: 1.  Follow Up of back pain  Hannah Lynch returns reporting increased lumbar and now bilateral leg pain since falling Sept 16th.  She states her left leg gave way, causing her to fall.  Norco 5/325 2-3/day  X-ray on [facility]  Patient has having pain to her right knee and also into the foot on the left.  She says her leg pain has got a lot worse.  She complains of left buttock pain and has positive straight leg raise right greater than  left.  I reviewed the patient's MRI which shows significant disc herniation at L3 L4 on the left.  She says that she has not received any relief after injection and she says her leg gave way on 16 September.  She says the severity of her pain is causing her significant incapacity.  I reviewed her MRI with her and this shows a significant disc herniation at L3-4 on the left with left-sided nerve root compression.  I have recommended proceeding with microdiscectomy at L3 L4 on the left.  She wishes to do so.      Medical/Surgical/Interim History Reviewed, no change.  Last detailed document date:02/20/2014.   PAST MEDICAL HISTORY, SURGICAL HISTORY, FAMILY HISTORY, SOCIAL HISTORY AND REVIEW OF SYSTEMS I have reviewed the patient's past medical, surgical, family and social history as well as the comprehensive review of systems as included on the [facility] history form dated 05/04/2014, which I have signed.  Family History: Reviewed, no changes.  Last detailed document: 02/20/2014.   Social History: Tobacco use reviewed. Reviewed, no changes. Last detailed document date: 02/20/2014.      MEDICATIONS(added, continued or stopped this visit):   Started Medication Directions Instruction Stopped   Ativan 0.5 mg tablet take 2 tablet by oral route 3 times every day as needed     estradiol 2 mg tablet take 1 tablet by oral route  every day     Flexeril 10 mg tablet take 1 tablet by oral route  every day     Lexapro 20 mg tablet take 1 tablet by oral route  every day    04/26/2014 Mobic 15 mg tablet take 1 tablet by oral route  every day    07/31/2014 Norco 5 mg-325 mg tablet take 1 tablet by oral route  every 6 hours as needed for pain  08/23/2014  08/23/2014 Norco 5 mg-325 mg tablet take 1 tablet by oral route  every 6 hours as needed for pain     prednisone 20 mg tablet take 1 tablet by oral route  every day    03/07/2014 tramadol 50 mg tablet take 1 tablet by  oral route  every 6 hours as needed      ALLERGIES:  Ingredient Reaction Medication Name Comment  NO KNOWN ALLERGIES     No known allergies.    Vitals Date Temp F BP Pulse Ht In Wt Lb BMI BSA Pain Score  08/23/2014  130/88 78 65 209 34.78  7/10        IMPRESSION Progressive radiculopathy without relief with injections or therapy.  Patient wishes to proceed with surgery  Completed Orders (this encounter) Order Details Reason Side Interpretation Result Initial Treatment Date Region  Lifestyle education regarding  diet Encouraged to eat a well balanced diet and follow up with primary care physician.        Lumbar Spine- AP/Lat/Obls/Spot/Flex/Ex      08/23/2014 All Levels to All Levels   Assessment/Plan # Detail Type Description   1. 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       2. Assessment Body mass index (BMI) 34.0-34.9, adult (Z68.34).   Plan Orders Today's instructions / counseling include(s) Lifestyle education regarding diet.       3. Assessment Acute lumbar radiculopathy (M54.16).         Pain Assessment/Treatment Pain Scale: 7/10. Method: Numeric Pain Intensity Scale. Location: back. Onset: 02/06/2014. Duration: varies. Quality: discomforting. Pain Assessment/Treatment follow-up plan of care: Patient is currently taking medication for pain as prescribed..  Left L3 L4 microdiscectomy to be performed on 09/19/14.  Risks and benefits were discussed in detail with patient and she wishes to proceed.  Orders: Diagnostic Procedures: Assessment Procedure  M51.26 Microdiscectomy - left - L3-L4  M54.16 Lumbar Spine- AP/Lat/Obls/Spot/Flex/Ex  Instruction(s)/Education: Assessment Instruction  Z68.34 Lifestyle education regarding diet    MEDICATIONS PRESCRIBED TODAY    Rx Quantity Refills  NORCO 5 mg-325 mg  60 0            Provider:  Danae OrleansJoseph D. Venetia MaxonStern MD  08/27/2014 07:46 PM Dictation edited by: Danae OrleansJoseph D.  Venetia MaxonStern    CC Providers: Maeola HarmanJoseph Stern MD [address] [place], [place] [id number] ----------------------------------------------------------------------------------------------------------------------------------------------------------------------         Electronically signed by Danae OrleansJoseph D. Venetia MaxonStern MD on 08/27/2014 07:46 PM

## 2014-09-25 NOTE — Anesthesia Preprocedure Evaluation (Addendum)
Anesthesia Evaluation  Patient identified by MRN, date of birth, ID band Patient awake    Reviewed: Allergy & Precautions, H&P , NPO status , Patient's Chart, lab work & pertinent test results  History of Anesthesia Complications (+) PONV and history of anesthetic complications  Airway Mallampati: I  TM Distance: >3 FB Neck ROM: Full    Dental  (+) Teeth Intact, Dental Advisory Given   Pulmonary neg pulmonary ROS,  breath sounds clear to auscultation        Cardiovascular - anginanegative cardio ROS  Rhythm:Regular Rate:Normal     Neuro/Psych Chronic back pain to L leg: narcotics    GI/Hepatic negative GI ROS, Neg liver ROS,   Endo/Other  Morbid obesity  Renal/GU negative Renal ROS     Musculoskeletal  (+) Arthritis -,   Abdominal (+) + obese,   Peds  Hematology negative hematology ROS (+)   Anesthesia Other Findings   Reproductive/Obstetrics                           Anesthesia Physical Anesthesia Plan  ASA: II  Anesthesia Plan: General   Post-op Pain Management:    Induction: Intravenous  Airway Management Planned: Oral ETT  Additional Equipment:   Intra-op Plan:   Post-operative Plan: Extubation in OR  Informed Consent: I have reviewed the patients History and Physical, chart, labs and discussed the procedure including the risks, benefits and alternatives for the proposed anesthesia with the patient or authorized representative who has indicated his/her understanding and acceptance.   Dental advisory given  Plan Discussed with: Surgeon and CRNA  Anesthesia Plan Comments: (Plan routine monitors, GETA)        Anesthesia Quick Evaluation

## 2014-09-25 NOTE — Anesthesia Postprocedure Evaluation (Signed)
  Anesthesia Post-op Note  Patient: Hannah MckusickLisa O Meller  Procedure(s) Performed: Procedure(s): Lumbar four-five MAXIMUM ACCESS (MAS) POSTERIOR LUMBAR INTERBODY FUSION (PLIF) 1 LEVEL (N/A)  Patient Location: PACU  Anesthesia Type:General  Level of Consciousness: sedated, patient cooperative and responds to stimulation  Airway and Oxygen Therapy: Patient Spontanous Breathing and Patient connected to nasal cannula oxygen  Post-op Pain: mild, pt able to sleep  Post-op Assessment: Post-op Vital signs reviewed, Patient's Cardiovascular Status Stable, Respiratory Function Stable, Patent Airway, No signs of Nausea or vomiting and Pain level controlled  Post-op Vital Signs: Reviewed and stable  Last Vitals:  Filed Vitals:   09/25/14 1250  BP: 111/72  Pulse: 90  Temp: 36.4 C  Resp: 16    Complications: No apparent anesthesia complications

## 2014-09-25 NOTE — Op Note (Signed)
09/25/2014  10:56 AM  PATIENT:  Hannah Lynch  43 y.o. female  PRE-OPERATIVE DIAGNOSIS:  Displacement of lumbar intervertebral disc without myelopathy, spinal stenosis, lumbar radiculopathy, lumbago L 45 level  POST-OPERATIVE DIAGNOSIS:  Displacement of lumbar intervertebral disc without myelopathy, spinal stenosis, lumbar radiculopathy, lumbago L 45 level   PROCEDURE:  Procedure(s): Lumbar four-five MAXIMUM ACCESS (MAS) POSTERIOR LUMBAR INTERBODY FUSION (PLIF) 1 LEVEL (N/A) with redo laminectomy, PLIF, pedicle screw fixation, posterolateral arthrodesis  Decompression greater than for standard PLIF procedure, including redo laminectomy and decompression of neural elements  SURGEON:  Surgeon(s) and Role:    * Joseph Stern, MD - Primary    * Jeffrey Jenkins, MD - Assisting  PHYSICIAN ASSISTANT:   ASSISTANTS: Poteat, RN   ANESTHESIA:   general  EBL:  Total I/O In: 1000 [I.V.:1000] Out: 685 [Urine:585; Blood:100]  BLOOD ADMINISTERED:none  DRAINS: none   LOCAL MEDICATIONS USED:  LIDOCAINE   SPECIMEN:  No Specimen  DISPOSITION OF SPECIMEN:  N/A  COUNTS:  YES  TOURNIQUET:  * No tourniquets in log *  DICTATION: Patient is a 43-year-old with spondylosis , stenosis, disc herniation and severe back and bilateral lower extremity pain at L4/5 levels of the lumbar spine. It was elected to take her to surgery for MASPLIF L 45 level with redo decompression with posterolateral arthrodesis.  Procedure:   Following uncomplicated induction of GETA, and placement of electrodes for neural monitoring, patient was turned into a prone position on the Jackson tableand using AP  fluoroscopy the area of planned incision was marked, prepped with betadine scrub and Duraprep, then draped. Exposure was performed of facet joint complex at L 45 level and the MAS retractor was placed.5.0 x 35 mm cortical Nuvasive screws were placed at L 4 bilaterally according to standard landmarks using neural  monitoring.  A total laminectomy of L 4 was then performed with disarticulation of facets.  This bone was saved for grafting, combined with Osteocel after being run through bone mill and was placed in bone packing device.  Thorough discectomy was performed bilaterally at L 45 and the endplates were prepared for grafting.  This required painstaking microdissection through prior scar tissue on the left and on both sides, greater decompression than would typically be performed for standard PLIF procedure. 23 x 8 x 8 degree cages were placed in the interspace and positioning was confirmed with AP and lateral fluoroscopy.  10 cc of autograft/Osteocel was packed in the interspace medial to the second cage.   Remaining screws were placed at L 5 and 35 mm rods were placed.   And the screws were locked and torqued.Final Xrays showed well positioned implants and screw fixation. The posterolateral region was packed with remaining 10 cc of autograft on the left of midline. The wounds were irrigated and then closed with 1, 2-0 and 3-0 Vicryl stitches. Sterile occlusive dressing was placed with Dermabond. The patient was then extubated in the operating room and taken to recovery in stable and satisfactory condition having tolerated her operation well. Counts were correct at the end of the case.  PLAN OF CARE: Admit to inpatient   PATIENT DISPOSITION:  PACU - hemodynamically stable.   Delay start of Pharmacological VTE agent (>24hrs) due to surgical blood loss or risk of bleeding: yes  

## 2014-09-26 ENCOUNTER — Encounter (HOSPITAL_COMMUNITY): Payer: Self-pay | Admitting: Neurosurgery

## 2014-09-26 MED ORDER — METHOCARBAMOL 500 MG PO TABS: 500.0000 mg | ORAL_TABLET | Freq: Four times a day (QID) | ORAL | Status: DC | PRN

## 2014-09-26 MED ORDER — OXYCODONE-ACETAMINOPHEN 5-325 MG PO TABS: 1.0000 | ORAL_TABLET | ORAL | Status: DC | PRN

## 2014-09-26 NOTE — Progress Notes (Signed)
Pt doing well. Pt and family given D/C instructions with Rx's, verbal understanding of teaching was provided. Pt's incision is clean and dry with no sign of infection. Pt's IV was removed prior to D/C. Pt D/C'd home via walking @ 0825 per MD order. Pt is stable @ D/C and has no other needs at this time. Rema FendtAshley Allred, RN

## 2014-09-26 NOTE — Progress Notes (Signed)
Subjective: Patient reports doing well  Objective: Vital signs in last 24 hours: Temp:  [97.6 F (36.4 C)-98.7 F (37.1 C)] 98.7 F (37.1 C) (11/17 0300) Pulse Rate:  [55-97] 88 (11/17 0300) Resp:  [8-20] 18 (11/17 0300) BP: (90-118)/(41-93) 90/63 mmHg (11/17 0300) SpO2:  [91 %-98 %] 97 % (11/17 0300)  Intake/Output from previous day: 11/16 0701 - 11/17 0700 In: 1480 [P.O.:480; I.V.:1000] Out: 785 [Urine:585; Emesis/NG output:100; Blood:100] Intake/Output this shift:    Physical Exam: Strength full, walking in halls.  Wearing brace.  Dressing CDI.  Lab Results:  Recent Labs  09/25/14 0654  WBC 7.7  HGB 13.9  HCT 40.1  PLT 223   BMET No results for input(s): NA, K, CL, CO2, GLUCOSE, BUN, CREATININE, CALCIUM in the last 72 hours.  Studies/Results: Dg Lumbar Spine Complete  09/25/2014   CLINICAL DATA:  Status post lumbar interbody fusion at L4-5  EXAM: DG C-ARM 61-120 MIN; LUMBAR SPINE - COMPLETE 4+ VIEW  TECHNIQUE: N/A  CONTRAST:  None  FLUOROSCOPY TIME:  0 min, 53 seconds as reported by the performing Surgeon Dr. Venetia MaxonStern  COMPARISON:  Preoperative lumbar spine series of August 23, 2014  FINDINGS: The patient has undergone inter discal device placement with placement of pedicle screws at the L4-5 level.  IMPRESSION: L4-5 posterior fusion and intradiscal device placement without evidence of immediate postprocedure complication.   Electronically Signed   By: David  [place]   On: 09/25/2014 10:56   Dg C-arm 1-60 Min  09/25/2014   CLINICAL DATA:  Status post lumbar interbody fusion at L4-5  EXAM: DG C-ARM 61-120 MIN; LUMBAR SPINE - COMPLETE 4+ VIEW  TECHNIQUE: N/A  CONTRAST:  None  FLUOROSCOPY TIME:  0 min, 53 seconds as reported by the performing Surgeon Dr. Venetia MaxonStern  COMPARISON:  Preoperative lumbar spine series of August 23, 2014  FINDINGS: The patient has undergone inter discal device placement with placement of pedicle screws at the L4-5 level.  IMPRESSION: L4-5 posterior  fusion and intradiscal device placement without evidence of immediate postprocedure complication.   Electronically Signed   By: David  [place]   On: 09/25/2014 10:56    Assessment/Plan: Discharge home.  Doing well.  F/U 3 weeks in office.    LOS: 1 day    Dorian HeckleSTERN,JOSEPH D, MD 09/26/2014, 7:37 AM

## 2014-09-26 NOTE — Discharge Summary (Signed)
Physician Discharge Summary  Patient ID: Hannah Lynch MRN: [id number] DOB/AGE: 42/01/1971 42 y.o.  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  She mobilized rapidly and did well postoperatively  Consults: None  Significant Diagnostic Studies: None  Treatments: surgery: redo decompression with fusion L 45 level  Discharge Exam: Blood pressure 90/63, pulse 88, temperature 98.7 F (37.1 C), temperature source Oral, resp. rate 18, weight 95.709 kg (211 lb), SpO2 97 %. Neurologic: Alert and oriented X 3, normal strength and tone. Normal symmetric reflexes. Normal coordination and gait Wound:CDI  Disposition: Home     Medication List    TAKE these medications        escitalopram 20 MG tablet  Commonly known as:  LEXAPRO  Take 20 mg by mouth daily.     estradiol 2 MG tablet  Commonly known as:  ESTRACE  Take 2 mg by mouth daily.     HYDROcodone-acetaminophen 5-325 MG per tablet  Commonly known as:  NORCO/VICODIN  Take 1-2 tablets by mouth every 6 (six) hours as needed for moderate pain or severe pain.     LORazepam 0.5 MG tablet  Commonly known as:  ATIVAN  Take 0.5 mg by mouth every 8 (eight) hours as needed for anxiety.     meloxicam 15 MG tablet  Commonly known as:  MOBIC  Take 15 mg by mouth daily.     methocarbamol 500 MG tablet  Commonly known as:  ROBAXIN  Take 1 tablet (500 mg total) by mouth every 6 (six) hours as needed for muscle spasms.     oxyCODONE-acetaminophen 5-325 MG per tablet  Commonly known as:  PERCOCET/ROXICET  Take 1-2 tablets by mouth every  4 (four) hours as needed for moderate pain or severe pain.         Signed: Dorian HeckleSTERN,JOSEPH D, MD 09/26/2014, 7:38 AM

## 2014-09-26 NOTE — Progress Notes (Signed)
Occupational Therapy Treatment Patient Details Name: Hannah Lynch MRN: [id number] DOB: 09/25/1971 Today's Date: 09/26/2014    History of present illness 43 y.o. s/p Lumbar four-five MAXIMUM ACCESS (MAS) POSTERIOR LUMBAR INTERBODY FUSION (PLIF) 1 LEVEL.   OT comments  Provided A/E including toilet aide for safe completion of ADLS while adhering to back precautions.  Pt. Able to return demo.  Reports she is amb. In room on her own. Pain well managed.  Will have shower chair at home.  Eager for d/c home.  No questions or concerns from pt.  Follow Up Recommendations  No OT follow up;Supervision - Intermittent    Equipment Recommendations  3 in 1 bedside comode    Recommendations for Other Services      Precautions / Restrictions Precautions Precautions: Back Precaution Comments: educated on back precautions Required Braces or Orthoses: Spinal Brace Spinal Brace: Lumbar corset;Applied in sitting position       Mobility Bed Mobility               General bed mobility comments: seated eob upon arrival  Transfers Overall transfer level: Needs assistance Equipment used: None Transfers: Sit to/from Stand Sit to Stand: Supervision              Balance                                   ADL Overall ADL's : Needs assistance/impaired             Lower Body Bathing: Set up;Supervison/ safety Lower Body Bathing Details (indicate cue type and reason): able to return demo with LH sponge     Lower Body Dressing: Supervision/safety;With adaptive equipment;Sit to/from stand Lower Body Dressing Details (indicate cue type and reason): able to return demo with A/E for LB dressing   Toilet Transfer Details (indicate cue type and reason): s while ambulating in the room, reports going to /from b.room by herself     Tub/ Shower Transfer: Tub transfer;Cueing for sequencing Tub/Shower Transfer Details (indicate cue type and reason): reviewed sequencing and  techinque for side stepping over the tub, reviewed recommendations for family to assist in/out of tub, pt. will also have a shower chair Functional mobility during ADLs: Supervision/safety General ADL Comments: provided A/E for pt. including toilet aide.  pt. able to return demo and is eager for d/c home.  will have shower chair for safe bathing      Vision                     Perception     Praxis      Cognition   Behavior During Therapy: WFL for tasks assessed/performed Overall Cognitive Status: Within Functional Limits for tasks assessed                       Extremity/Trunk Assessment               Exercises     Shoulder Instructions       General Comments      Pertinent Vitals/ Pain       Pain Assessment: 0-10 Pain Score: 4  Pain Location: back Pain Descriptors / Indicators: Aching Pain Intervention(s): Repositioned;Premedicated before session  Home Living  Prior Functioning/Environment              Frequency Min 2X/week     Progress Toward Goals  OT Goals(current goals can now be found in the care plan section)  Progress towards OT goals: Progressing toward goals     Plan Discharge plan remains appropriate    Co-evaluation                 End of Session Equipment Utilized During Treatment: Back brace   Activity Tolerance Patient tolerated treatment well   Patient Left in chair;with call bell/phone within reach   Nurse Communication          Time: [id number] OT Time Calculation (min): 15 min  Charges: OT General Charges $OT Visit: 1 Procedure OT Treatments $Self Care/Home Management : 8-22 mins  Robet LeuMorris, Jennifer Lorraine, COTA/L 09/26/2014, 7:55 AM

## 2014-09-26 NOTE — Discharge Instructions (Signed)
Wound Care °Leave incision open to air. °You may shower. °Do not scrub directly on incision.  °Do not put any creams, lotions, or ointments on incision. °Activity °Walk each and every day, increasing distance each day. °No lifting greater than 5 lbs.  Avoid bending, arching, and twisting. °No driving for 2 weeks; may ride as a passenger locally. °If provided with back brace, wear when out of bed.  It is not necessary to wear in bed. °Diet °Resume your normal diet.  °Return to Work °Will be discussed at you follow up appointment. °Call Your Doctor If Any of These Occur °Redness, drainage, or swelling at the wound.  °Temperature greater than 101 degrees. °Severe pain not relieved by pain medication. °Incision starts to come apart. °Follow Up Appt °Call today for appointment in 3-4 weeks ([phone]) or for problems.  If you have any hardware placed in your spine, you will need an x-ray before your appointment. ° ° °Spinal Fusion °Spinal fusion is a procedure to make 2 or more of the bones in your spinal column (vertebrae) grow together (fuse). This procedure stops movement between the vertebrae and can relieve pain and prevent deformity.  °Spinal fusion is used to treat the following conditions: °· Fractures of the spine. °· Herniated disk (the spongy material [cartilage] between the vertebrae). °· Abnormal curvatures of the spine, such as scoliosis or kyphosis. °· A weak or an unstable spine, caused by infections or tumor. °RISKS AND COMPLICATIONS °Complications associated with spinal fusion are rare, but they can occur. Possible complications include: °· Bleeding. °· Infection near the incision. °· Nerve damage. Signs of nerve damage are back pain, pain in one or both legs, weakness, or numbness. °· Spinal fluid leakage. °· Blood clot in your leg, which can move to your lungs. °· Difficulty controlling urination or bowel movements. °BEFORE THE PROCEDURE °· A medical evaluation will be done. This will include a  physical exam, blood tests, and imaging exams. °· You will talk with an anesthesiologist. This is the person who will be in charge of the anesthesia during the procedure. Spinal fusion usually requires that you are asleep during the procedure (general anesthesia). °· You will need to stop taking certain medicines, particularly those associated with an increased risk of bleeding. Ask your caregiver about changing or stopping your regular medicines. °· If you smoke, you will need to stop at least 2 weeks before the procedure. Smoking can slow down the healing process, especially fusion of the vertebrae, and increase the risk of complications. °· Do not eat or drink anything for at least 8 hours before the procedure. °PROCEDURE  °A cut (incision) is made over the vertebrae that will be fused. The back muscles are separated from the vertebrae. If you are having this procedure to treat a herniated disk, the disc material pressing on the nerve root is removed (decompression). The area where the disk is removed is then filled with extra bone. Bone from another part of your body (autogenous bone) or bone from a bone donor (allograft bone) may be used. The extra bone promotes fusion between the vertebrae. Sometimes, specific medicines are added to the fusion area to promote bone healing. In most cases, screws and rods or metal plates will be used to attach the vertebrae to stabilize them while they fuse.  °AFTER THE PROCEDURE  °· You will stay in a recovery area until the anesthesia has worn off. Your blood pressure and pulse will be checked frequently. °· You will be   given antibiotics to prevent infection. °· You may continue to receive fluids through an intravenous (IV) tube while you are still in the hospital. °· Pain after surgery is normal. You will be given pain medicine. °· You will be taught how to move correctly and how to stand and walk. While in bed, you will be instructed to turn frequently, using a "log rolling"  technique, in which the entire body is moved without twisting the back. °Document Released: 07/26/2003 Document Revised: 01/19/2012 Document Reviewed: 01/09/2011 °[employer]® Patient Information ©2015 [employer]. This information is not intended to replace advice given to you by your health care provider. Make sure you discuss any questions you have with your health care provider. ° °

## 2014-11-23 ENCOUNTER — Encounter (HOSPITAL_COMMUNITY): Payer: Self-pay | Admitting: Neurosurgery

## 2014-12-18 ENCOUNTER — Emergency Department (HOSPITAL_COMMUNITY)
Admission: EM | Admit: 2014-12-18 | Discharge: 2014-12-18 | Disposition: A | Discharge status: Home / Self Care | Payer: No Typology Code available for payment source | Attending: Emergency Medicine | Admitting: Emergency Medicine

## 2014-12-18 ENCOUNTER — Encounter (HOSPITAL_COMMUNITY): Payer: Self-pay | Admitting: *Deleted

## 2014-12-18 DIAGNOSIS — Z791 Long term (current) use of non-steroidal anti-inflammatories (NSAID): Secondary | ICD-10-CM | POA: Insufficient documentation

## 2014-12-18 DIAGNOSIS — Z79899 Other long term (current) drug therapy: Secondary | ICD-10-CM | POA: Insufficient documentation

## 2014-12-18 DIAGNOSIS — F419 Anxiety disorder, unspecified: Secondary | ICD-10-CM | POA: Insufficient documentation

## 2014-12-18 DIAGNOSIS — F329 Major depressive disorder, single episode, unspecified: Secondary | ICD-10-CM | POA: Insufficient documentation

## 2014-12-18 DIAGNOSIS — M5442 Lumbago with sciatica, left side: Secondary | ICD-10-CM

## 2014-12-18 MED ORDER — MORPHINE SULFATE 4 MG/ML IJ SOLN
4.0000 mg | Freq: Once | INTRAMUSCULAR | Status: AC
  Administered 2014-12-18: 4 mg via INTRAMUSCULAR
  Filled 2014-12-18: qty 1

## 2014-12-18 NOTE — ED Notes (Signed)
On re check for Pt reide home Pt then reported Her Daughter had left and she called her husband. Pt instructed  Safety required a driver to be present when Pt is to receive Narcotics IM .

## 2014-12-18 NOTE — ED Provider Notes (Signed)
CSN: [id number]     Arrival date & time 12/18/14  0801 History   First MD Initiated Contact with Patient 12/18/14 [phone]     Chief Complaint  Patient presents with  . Back Pain     (Consider location/radiation/quality/duration/timing/severity/associated sxs/prior Treatment) HPI  Hannah Lynch is a 44 y.o. female with PMH of degenerative disc disease with lumbar fusion November 2015 presenting with left-sided low back pain for 2 days. Patient states is similar to her prior back pain that is sharp and stabbing and at times radiates down her left leg. Patient states ambulation and movement make it worse. She has taken Percocet as well as muscle relaxers, Robaxin without significant improvement. Patient stated she ran out of her Percocet one week ago. She has follow-up appointment with Dr. Venetia MaxonStern her neurosurgeon in a little over a week. No fevers, chills, night sweats, weight loss, IVDU, history of malignancy. No loss of control of bladder or bowel. No saddle anesthesia. Patient reports persistent numbness and tingling to the left toes onset before surgery. She reports no increase or change in this. No weakness.    Past Medical History  Diagnosis Date  . DDD (degenerative disc disease)   . Complication of anesthesia   . PONV (postoperative nausea and vomiting)   . Family history of adverse reaction to anesthesia     nausea and vomiting  . Depression   . Anxiety    Past Surgical History  Procedure Laterality Date  . Neck surgery  2008 and 2013  . Ankle surgery Right 1993 and 2005  . Knee surgery  2012  . Abdominal hysterectomy  2005  . Back surgery  2006  . Maximum access (mas)posterior lumbar interbody fusion (plif) 1 level N/A 09/25/2014    Procedure: Lumbar four-five MAXIMUM ACCESS (MAS) POSTERIOR LUMBAR INTERBODY FUSION (PLIF) 1 LEVEL;  Surgeon: Maeola HarmanJoseph Stern, MD;  Location: [facility];  Service: Neurosurgery;  Laterality: N/A;   Family History  Problem Relation Age of Onset  .  Hypertension Mother   . Diabetes Mother   . Heart failure Father   . Hypertension Father   . Diabetes Father   . Heart failure Other    History  Substance Use Topics  . Smoking status: Never Smoker   . Smokeless tobacco: Never Used  . Alcohol Use: Yes     Comment: socially   OB History    No data available     Review of Systems  Constitutional: Negative for fever and chills.  Gastrointestinal: Negative for nausea, vomiting and abdominal pain.  Genitourinary: Negative for dysuria and urgency.  Musculoskeletal: Positive for back pain. Negative for gait problem.  Neurological: Positive for numbness. Negative for weakness.      Allergies  Review of patient's allergies indicates no known allergies.  Home Medications   Prior to Admission medications   Medication Sig Start Date End Date Taking? Authorizing Provider  escitalopram (LEXAPRO) 20 MG tablet Take 20 mg by mouth daily.    Historical Provider, MD  estradiol (ESTRACE) 2 MG tablet Take 2 mg by mouth daily.    Historical Provider, MD  HYDROcodone-acetaminophen (NORCO/VICODIN) 5-325 MG per tablet Take 1-2 tablets by mouth every 6 (six) hours as needed for moderate pain or severe pain.    Historical Provider, MD  LORazepam (ATIVAN) 0.5 MG tablet Take 0.5 mg by mouth every 8 (eight) hours as needed for anxiety.    Historical Provider, MD  meloxicam (MOBIC) 15 MG tablet Take 15 mg by  mouth daily.    Historical Provider, MD  methocarbamol (ROBAXIN) 500 MG tablet Take 1 tablet (500 mg total) by mouth every 6 (six) hours as needed for muscle spasms. 09/26/14   Maeola Harman, MD  oxyCODONE-acetaminophen (PERCOCET/ROXICET) 5-325 MG per tablet Take 1-2 tablets by mouth every 4 (four) hours as needed for moderate pain or severe pain. 09/26/14   Maeola Harman, MD   BP 125/90 mmHg  Pulse 89  Temp(Src) 97.6 F (36.4 C) (Oral)  Resp 16  Ht  (1.651 m)  Wt 210 lb (95.255 kg)  BMI 34.95 kg/m2  SpO2 100% Physical Exam   Constitutional: She appears well-developed and well-nourished. No distress.  HENT:  Head: Normocephalic and atraumatic.  Eyes: Conjunctivae are normal. Right eye exhibits no discharge. Left eye exhibits no discharge.  Cardiovascular: Normal rate, regular rhythm and normal heart sounds.   Pulmonary/Chest: Effort normal and breath sounds normal. No respiratory distress. She has no wheezes.  Abdominal: Soft. Bowel sounds are normal. She exhibits no distension. There is no tenderness.  Musculoskeletal:  No midline back tenderness, step off or crepitus. Well healing midline scar without evidence of infection. Nontender. Left sided lower back tenderness. No CVA tenderness.   Neurological: She is alert. Coordination normal.  Equal muscle tone. 5/5 strength in lower extremities. DTR equal and intact. Positive left straight leg test. Antalgic gait.   Skin: Skin is warm and dry. She is not diaphoretic.  Nursing note and vitals reviewed.   ED Course  Procedures (including critical care time) Labs Review Labs Reviewed - No data to display  Imaging Review No results found.   EKG Interpretation None      MDM   Final diagnoses:  Left-sided low back pain with left-sided sciatica   Patient with back pain. Shin status post lumbar fusion. She has appointment with her neurosurgeon Dr. Venetia Maxon in however weak. He stated she would have had multiple fusions. No loss of bowel or bladder control. No saddle anesthesia. No fever, night sweats, weight loss, h/o cancer, IVDU. VSS. No neurological deficits and normal neuro exam. Patient can walk but states is painful. No concern for cauda equina.  RICE protocol and pain medicine given in ED. Discussed further pain medicines would need to come from her neurosurgeon or PCP. This is to become chronic for her she would need to get involved with pain management. Patient called husband have him take her home. He is bedside.  Patient is afebrile, nontoxic, and in no  acute distress. Patient is appropriate for outpatient management and is stable for discharge.  Discussed return precautions with patient. Discussed all results and patient verbalizes understanding and agrees with plan.    Louann Sjogren, PA-C 12/18/14 0915  Toy Baker, MD 12/19/14 [phone]

## 2014-12-18 NOTE — Discharge Instructions (Signed)
Return to the emergency room with worsening of symptoms, new symptoms or with symptoms that are concerning , especially fevers, loss of control of bladder or bowels, numbness or tingling around genital region or anus, weakness. RICE: Rest, Ice (three cycles of 20 mins on, 86mins off at least twice a day), compression/brace, elevation. Heating pad works well for back pain. Ibuprofen $RemoveBeforeD'400mg'[id number]$  (2 tablets $RemoveBe'200mg'[id number]$ ) every 5-6 hours for 3-5 days. Call to make an early appointment with your neurosurgeon. Please call your doctor for a followup appointment within 24-48 hours. When you talk to your doctor please let them know that you were seen in the emergency department and have them acquire all of your records so that they can discuss the findings with you and formulate a treatment plan to fully care for your new and ongoing problems.  Back Injury Prevention Back injuries can be extremely painful and difficult to heal. After having one back injury, you are much more likely to experience another later on. It is important to learn how to avoid injuring or re-injuring your back. The following tips can help you to prevent a back injury. PHYSICAL FITNESS  Exercise regularly and try to develop good tone in your abdominal muscles. Your abdominal muscles provide a lot of the support needed by your back.  Do aerobic exercises (walking, jogging, biking, swimming) regularly.  Do exercises that increase balance and strength (tai chi, yoga) regularly. This can decrease your risk of falling and injuring your back.  Stretch before and after exercising.  Maintain a healthy weight. The more you weigh, the more stress is placed on your back. For every pound of weight, 10 times that amount of pressure is placed on the back. DIET  Talk to your caregiver about how much calcium and vitamin D you need per day. These nutrients help to prevent weakening of the bones (osteoporosis). Osteoporosis can cause broken (fractured) bones  that lead to back pain.  Include good sources of calcium in your diet, such as dairy products, green, leafy vegetables, and products with calcium added (fortified).  Include good sources of vitamin D in your diet, such as milk and foods that are fortified with vitamin D.  Consider taking a nutritional supplement or a multivitamin if needed.  Stop smoking if you smoke. POSTURE  Sit and stand up straight. Avoid leaning forward when you sit or hunching over when you stand.  Choose chairs with good low back (lumbar) support.  If you work at a desk, sit close to your work so you do not need to lean over. Keep your chin tucked in. Keep your neck drawn back and elbows bent at a right angle. Your arms should look like the letter "L."  Sit high and close to the steering wheel when you drive. Add a lumbar support to your car seat if needed.  Avoid sitting or standing in one position for too long. Take breaks to get up, stretch, and walk around at least once every hour. Take breaks if you are driving for long periods of time.  Sleep on your side with your knees slightly bent, or sleep on your back with a pillow under your knees. Do not sleep on your stomach. LIFTING, TWISTING, AND REACHING  Avoid heavy lifting, especially repetitive lifting. If you must do heavy lifting:  Stretch before lifting.  Work slowly.  Rest between lifts.  Use carts and dollies to move objects when possible.  Make several small trips instead of carrying 1 heavy load.  Ask for help when you need it.  Ask for help when moving big, awkward objects.  Follow these steps when lifting:  Stand with your feet shoulder-width apart.  Get as close to the object as you can. Do not try to pick up heavy objects that are far from your body.  Use handles or lifting straps if they are available.  Bend at your knees. Squat down, but keep your heels off the floor.  Keep your shoulders pulled back, your chin tucked in, and  your back straight.  Lift the object slowly, tightening the muscles in your legs, abdomen, and buttocks. Keep the object as close to the center of your body as possible.  When you put a load down, use these same guidelines in reverse.  Do not:  Lift the object above your waist.  Twist at the waist while lifting or carrying a load. Move your feet if you need to turn, not your waist.  Bend over without bending at your knees.  Avoid reaching over your head, across a table, or for an object on a high surface. OTHER TIPS  Avoid wet floors and keep sidewalks clear of ice to prevent falls.  Do not sleep on a mattress that is too soft or too hard.  Keep items that are used frequently within easy reach.  Put heavier objects on shelves at waist level and lighter objects on lower or higher shelves.  Find ways to decrease your stress, such as exercise, massage, or relaxation techniques. Stress can build up in your muscles. Tense muscles are more vulnerable to injury.  Seek treatment for depression or anxiety if needed. These conditions can increase your risk of developing back pain. SEEK MEDICAL CARE IF:  You injure your back.  You have questions about diet, exercise, or other ways to prevent back injuries. MAKE SURE YOU:  Understand these instructions.  Will watch your condition.  Will get help right away if you are not doing well or get worse. Document Released: 12/04/2004 Document Revised: 01/19/2012 Document Reviewed: 12/08/2011 [facility] Patient Information 2015 [place], [place]. This information is not intended to replace advice given to you by your health care provider. Make sure you discuss any questions you have with your health care provider.

## 2014-12-18 NOTE — ED Notes (Signed)
Declined W/C at D/C and was escorted to lobby by RN. 

## 2014-12-18 NOTE — ED Notes (Signed)
Pt Pt reported herr Daughter was in waiting for her in waiting room. Upon request to have Daughter present at bed side ,Pt then stated Her Daughter wsas olut in the car waiting. Pt reported she woujl,d call her Daughter to come into ED.

## 2014-12-18 NOTE — ED Notes (Signed)
Pt reports back pain since WED. And pain radiates to LT leg. Pt reports having a fusion in 09-2015.

## 2015-01-18 ENCOUNTER — Other Ambulatory Visit (HOSPITAL_COMMUNITY): Payer: Self-pay | Admitting: Neurosurgery

## 2015-01-18 DIAGNOSIS — M5415 Radiculopathy, thoracolumbar region: Secondary | ICD-10-CM

## 2015-02-05 ENCOUNTER — Ambulatory Visit (HOSPITAL_COMMUNITY)
Admission: RE | Admit: 2015-02-05 | Discharge: 2015-02-05 | Disposition: A | Discharge status: Home / Self Care | Payer: Self-pay | Source: Ambulatory Visit | Attending: Neurosurgery | Admitting: Neurosurgery

## 2015-02-05 DIAGNOSIS — M5415 Radiculopathy, thoracolumbar region: Secondary | ICD-10-CM | POA: Insufficient documentation

## 2015-02-05 MED ORDER — GADOBENATE DIMEGLUMINE 529 MG/ML IV SOLN: 20.0000 mL | Freq: Once | INTRAVENOUS | Status: AC | PRN

## 2015-02-05 MED ORDER — GADOBENATE DIMEGLUMINE 529 MG/ML IV SOLN
20.0000 mL | Freq: Once | INTRAVENOUS | Status: AC
  Administered 2015-02-05: 20 mL via INTRAVENOUS

## 2015-03-08 ENCOUNTER — Other Ambulatory Visit (HOSPITAL_COMMUNITY): Payer: Self-pay | Admitting: Family Medicine

## 2015-03-08 DIAGNOSIS — R109 Unspecified abdominal pain: Secondary | ICD-10-CM

## 2015-03-16 ENCOUNTER — Ambulatory Visit (HOSPITAL_COMMUNITY)
Admission: RE | Admit: 2015-03-16 | Discharge: 2015-03-16 | Disposition: A | Discharge status: Home / Self Care | Payer: No Typology Code available for payment source | Source: Ambulatory Visit | Attending: Family Medicine | Admitting: Family Medicine

## 2015-03-16 DIAGNOSIS — R109 Unspecified abdominal pain: Secondary | ICD-10-CM | POA: Insufficient documentation

## 2015-03-23 ENCOUNTER — Other Ambulatory Visit (HOSPITAL_COMMUNITY): Payer: Self-pay | Admitting: Family Medicine

## 2015-03-23 DIAGNOSIS — R1011 Right upper quadrant pain: Secondary | ICD-10-CM

## 2015-03-26 ENCOUNTER — Emergency Department (HOSPITAL_COMMUNITY)
Admission: EM | Admit: 2015-03-26 | Discharge: 2015-03-26 | Disposition: A | Discharge status: Home / Self Care | Payer: Self-pay | Attending: Emergency Medicine | Admitting: Emergency Medicine

## 2015-03-26 ENCOUNTER — Encounter (HOSPITAL_COMMUNITY): Payer: Self-pay | Admitting: *Deleted

## 2015-03-26 ENCOUNTER — Emergency Department (HOSPITAL_COMMUNITY): Payer: Self-pay

## 2015-03-26 DIAGNOSIS — F419 Anxiety disorder, unspecified: Secondary | ICD-10-CM | POA: Insufficient documentation

## 2015-03-26 DIAGNOSIS — R1032 Left lower quadrant pain: Secondary | ICD-10-CM | POA: Insufficient documentation

## 2015-03-26 DIAGNOSIS — Z8739 Personal history of other diseases of the musculoskeletal system and connective tissue: Secondary | ICD-10-CM | POA: Insufficient documentation

## 2015-03-26 DIAGNOSIS — Z79899 Other long term (current) drug therapy: Secondary | ICD-10-CM | POA: Insufficient documentation

## 2015-03-26 DIAGNOSIS — Z90710 Acquired absence of both cervix and uterus: Secondary | ICD-10-CM | POA: Insufficient documentation

## 2015-03-26 DIAGNOSIS — F329 Major depressive disorder, single episode, unspecified: Secondary | ICD-10-CM | POA: Insufficient documentation

## 2015-03-26 DIAGNOSIS — R112 Nausea with vomiting, unspecified: Secondary | ICD-10-CM | POA: Insufficient documentation

## 2015-03-26 DIAGNOSIS — R197 Diarrhea, unspecified: Secondary | ICD-10-CM | POA: Insufficient documentation

## 2015-03-26 LAB — COMPREHENSIVE METABOLIC PANEL
ALT: 28 U/L (ref 14–54)
AST: 25 U/L (ref 15–41)
Albumin: 3.6 g/dL (ref 3.5–5.0)
Alkaline Phosphatase: 66 U/L (ref 38–126)
Anion gap: 8 (ref 5–15)
BUN: 7 mg/dL (ref 6–20)
CO2: 26 mmol/L (ref 22–32)
Calcium: 9.1 mg/dL (ref 8.9–10.3)
Chloride: 103 mmol/L (ref 101–111)
Creatinine, Ser: 0.97 mg/dL (ref 0.44–1.00)
GFR calc Af Amer: >60 mL/min (ref >60)
GFR calc non Af Amer: >60 mL/min (ref >60)
Glucose, Bld: 98 mg/dL (ref 65–99)
Potassium: 4.2 mmol/L (ref 3.5–5.1)
Sodium: 137 mmol/L (ref 135–145)
Total Bilirubin: 0.5 mg/dL (ref 0.3–1.2)
Total Protein: 6.9 g/dL (ref 6.5–8.1)

## 2015-03-26 LAB — LIPASE, BLOOD: Lipase: 28 U/L (ref 22–51)

## 2015-03-26 LAB — CBC WITH DIFFERENTIAL/PLATELET
Basophils Absolute: 0 10*3/uL (ref 0.0–0.1)
Basophils Relative: 0 % (ref 0–1)
Eosinophils Absolute: 0.1 10*3/uL (ref 0.0–0.7)
Eosinophils Relative: 1 % (ref 0–5)
HCT: 40.1 % (ref 36.0–46.0)
Hemoglobin: 14.1 g/dL (ref 12.0–15.0)
Lymphocytes Relative: 28 % (ref 12–46)
Lymphs Abs: 1.9 10*3/uL (ref 0.7–4.0)
MCH: 32.6 pg (ref 26.0–34.0)
MCHC: 35.2 g/dL (ref 30.0–36.0)
MCV: 92.8 fL (ref 78.0–100.0)
Monocytes Absolute: 0.5 10*3/uL (ref 0.1–1.0)
Monocytes Relative: 7 % (ref 3–12)
Neutro Abs: 4.4 10*3/uL (ref 1.7–7.7)
Neutrophils Relative %: 64 % (ref 43–77)
Platelets: 232 10*3/uL (ref 150–400)
RBC: 4.32 MIL/uL (ref 3.87–5.11)
RDW: 12.5 % (ref 11.5–15.5)
WBC: 6.7 10*3/uL (ref 4.0–10.5)

## 2015-03-26 LAB — URINALYSIS, ROUTINE W REFLEX MICROSCOPIC
Bilirubin Urine: NEGATIVE
Glucose, UA: NEGATIVE mg/dL
Hgb urine dipstick: NEGATIVE
Ketones, ur: NEGATIVE mg/dL
Leukocytes, UA: NEGATIVE
Nitrite: NEGATIVE
Protein, ur: NEGATIVE mg/dL
Specific Gravity, Urine: 1.008 (ref 1.005–1.030)
Urobilinogen, UA: 0.2 mg/dL (ref 0.0–1.0)
pH: 8 (ref 5.0–8.0)

## 2015-03-26 MED ORDER — ONDANSETRON HCL 4 MG/2ML IJ SOLN
4.0000 mg | Freq: Once | INTRAMUSCULAR | Status: AC
  Administered 2015-03-26: 4 mg via INTRAVENOUS
  Filled 2015-03-26: qty 2

## 2015-03-26 MED ORDER — OXYCODONE-ACETAMINOPHEN 5-325 MG PO TABS
2.0000 | ORAL_TABLET | Freq: Once | ORAL | Status: AC
  Administered 2015-03-26: 2 via ORAL
  Filled 2015-03-26: qty 2

## 2015-03-26 MED ORDER — MORPHINE SULFATE 4 MG/ML IJ SOLN
4.0000 mg | Freq: Once | INTRAMUSCULAR | Status: AC
  Administered 2015-03-26: 4 mg via INTRAVENOUS
  Filled 2015-03-26: qty 1

## 2015-03-26 MED ORDER — MORPHINE SULFATE 4 MG/ML IJ SOLN: 4.0000 mg | Freq: Once | INTRAMUSCULAR | Status: DC

## 2015-03-26 MED ORDER — IOHEXOL 300 MG/ML  SOLN
100.0000 mL | Freq: Once | INTRAMUSCULAR | Status: AC | PRN
  Administered 2015-03-26: 100 mL via INTRAVENOUS

## 2015-03-26 MED ORDER — IOHEXOL 300 MG/ML  SOLN
25.0000 mL | INTRAMUSCULAR | Status: AC
  Administered 2015-03-26: 25 mL via ORAL

## 2015-03-26 NOTE — Discharge Instructions (Signed)

## 2015-03-26 NOTE — ED Provider Notes (Signed)
CSN: [id number]     Arrival date & time 03/26/15  1054 History   First MD Initiated Contact with Patient 03/26/15 1059     Chief Complaint  Patient presents with  . Abdominal Pain  . Diarrhea     (Consider location/radiation/quality/duration/timing/severity/associated sxs/prior Treatment) Patient is a 44 y.o. female presenting with abdominal pain and diarrhea. The history is provided by the patient. No language interpreter was used.  Abdominal Pain Associated symptoms: diarrhea, nausea and vomiting   Associated symptoms: no chest pain, no chills, no fever, no hematuria, no shortness of breath, no vaginal bleeding and no vaginal discharge   Diarrhea Associated symptoms: abdominal pain and vomiting   Associated symptoms: no chills and no fever   Ms. Bordley is a 44 y.o female with a history of depression and anxiety who presents with intermittent abdominal pain for the past 5 weeks. She came today because the pain started last night at 8pm and is constant with nausea and vomiting. She saw her pcp, Dr. Lysbeth Galas who thought the pain may be related to her gallbladder and ordered an [place] which was normal. She has a HIDA scan scheduled for next week. She called her pcp today who told her to come to the ED. She was not given pain medication by her pcp.  She did not take anything for pain since last night. Eating makes it worse. She denies any fever, chills, chest pain, or shortness of breath.  Past Medical History  Diagnosis Date  . DDD (degenerative disc disease)   . Complication of anesthesia   . PONV (postoperative nausea and vomiting)   . Family history of adverse reaction to anesthesia     nausea and vomiting  . Depression   . Anxiety    Past Surgical History  Procedure Laterality Date  . Neck surgery  2008 and 2013  . Ankle surgery Right 1993 and 2005  . Knee surgery  2012  . Abdominal hysterectomy  2005  . Back surgery  2006  . Maximum access (mas)posterior lumbar interbody fusion  (plif) 1 level N/A 09/25/2014    Procedure: Lumbar four-five MAXIMUM ACCESS (MAS) POSTERIOR LUMBAR INTERBODY FUSION (PLIF) 1 LEVEL;  Surgeon: Maeola Harman, MD;  Location: [facility];  Service: Neurosurgery;  Laterality: N/A;   Family History  Problem Relation Age of Onset  . Hypertension Mother   . Diabetes Mother   . Heart failure Father   . Hypertension Father   . Diabetes Father   . Heart failure Other    History  Substance Use Topics  . Smoking status: Never Smoker   . Smokeless tobacco: Never Used  . Alcohol Use: Yes     Comment: socially   OB History    No data available     Review of Systems  Constitutional: Negative for fever and chills.  Respiratory: Negative for shortness of breath.   Cardiovascular: Negative for chest pain.  Gastrointestinal: Positive for nausea, vomiting, abdominal pain and diarrhea. Negative for blood in stool.  Genitourinary: Negative for hematuria, flank pain, vaginal bleeding, vaginal discharge and difficulty urinating.  Neurological: Negative for dizziness.  All other systems reviewed and are negative.     Allergies  Review of patient's allergies indicates no known allergies.  Home Medications   Prior to Admission medications   Medication Sig Start Date End Date Taking? Authorizing Provider  citalopram (CELEXA) 20 MG tablet Take 20 mg by mouth daily.   Yes Historical Provider, MD  estradiol (ESTRACE) 2 MG  tablet Take 2 mg by mouth daily.   Yes Historical Provider, MD  HYDROcodone-acetaminophen (NORCO/VICODIN) 5-325 MG per tablet Take 1-2 tablets by mouth every 6 (six) hours as needed for moderate pain or severe pain.   Yes Historical Provider, MD  LORazepam (ATIVAN) 0.5 MG tablet Take 0.5 mg by mouth every 8 (eight) hours as needed for anxiety.   Yes Historical Provider, MD  omeprazole (PRILOSEC) 20 MG capsule Take 20 mg by mouth daily.   Yes Historical Provider, MD  methocarbamol (ROBAXIN) 500 MG tablet Take 1 tablet (500 mg total)  by mouth every 6 (six) hours as needed for muscle spasms. Patient not taking: Reported on 03/26/2015 09/26/14   Maeola HarmanJoseph Stern, MD  oxyCODONE-acetaminophen (PERCOCET/ROXICET) 5-325 MG per tablet Take 1-2 tablets by mouth every 4 (four) hours as needed for moderate pain or severe pain. Patient not taking: Reported on 03/26/2015 09/26/14   Maeola HarmanJoseph Stern, MD   BP 111/66 mmHg  Pulse 79  Temp(Src) 98.5 F (36.9 C) (Oral)  Resp 16  Wt 215 lb (97.523 kg)  SpO2 100% Physical Exam  Constitutional: She is oriented to person, place, and time. She appears well-developed and well-nourished.  HENT:  Head: Normocephalic and atraumatic.  Eyes: Conjunctivae are normal.  Neck: Normal range of motion. Neck supple.  Cardiovascular: Normal rate, regular rhythm and normal heart sounds.   Pulmonary/Chest: Effort normal and breath sounds normal. No respiratory distress. She has no wheezes.  Abdominal: Soft. Normal appearance. She exhibits no distension and no mass. There is tenderness in the left lower quadrant. There is no guarding and no CVA tenderness.  Musculoskeletal: Normal range of motion.  Neurological: She is alert and oriented to person, place, and time.  Skin: Skin is warm and dry.  Nursing note and vitals reviewed.   ED Course  Procedures (including critical care time) Labs Review Labs Reviewed  URINALYSIS, ROUTINE W REFLEX MICROSCOPIC - Abnormal; Notable for the following:    APPearance CLOUDY (*)    All other components within normal limits  CBC WITH DIFFERENTIAL/PLATELET  COMPREHENSIVE METABOLIC PANEL  LIPASE, BLOOD    Imaging Review Ct Abdomen Pelvis W Contrast  03/26/2015   CLINICAL DATA:  Intermittent left lower quadrant pain, diarrhea, nausea for the last 5 weeks, worsening since last night. One episode of vomiting.  EXAM: CT ABDOMEN AND PELVIS WITH CONTRAST  TECHNIQUE: Multidetector CT imaging of the abdomen and pelvis was performed using the standard protocol following bolus  administration of intravenous contrast.  CONTRAST:  100mL OMNIPAQUE IOHEXOL 300 MG/ML  SOLN  COMPARISON:  Ultrasound 03/16/2015  FINDINGS: Lower chest: Lung bases are clear. No effusions. Heart is normal size.  Hepatobiliary: Small low-density area adjacent to the falciform ligament, likely focal fatty infiltration. No other focal abnormality. Gallbladder is unremarkable.  Pancreas: No focal abnormality or ductal dilatation.  Spleen: No focal abnormality.  Normal size.  Adrenals/Urinary Tract: No focal renal or adrenal abnormality. No hydronephrosis. Urinary bladder is unremarkable.  Stomach/Bowel: Stomach, large and small bowel grossly unremarkable. Appendix is visualized and unremarkable.  Vascular/Lymphatic: No retroperitoneal or mesenteric adenopathy. Aorta normal caliber.  Reproductive: Prior hysterectomy.  No adnexal masses.  Other: No free fluid or free air.  Musculoskeletal: No focal bone lesion or acute bony abnormality. Postoperative changes from posterior fusion at L4-5.  IMPRESSION: No acute findings in the abdomen or pelvis.   Electronically Signed   By: Charlett NoseKevin  Dover M.D.   On: 03/26/2015 13:37     EKG Interpretation None  MDM   Final diagnoses:  Left lower quadrant pain  Patient presents for constant LLQ abdominal pain since 8pm last night with nausea and vomiting.  No vaginal discharge or bleeding.  She had a partial hysterectomy.  I do not suspect this is ovarian torsion since it has been ongoing for 5 weeks.  Her labs are unremarkable and she does not have a UTI.  CT is negative for mass or diverticulitis. Her gallbladder is unremarkable also.  She is no acute distress I feel comfortable with her following up with her pcp and taking tylenol or ibuprofen for pain.      Catha GosselinHanna Patel-Mills, PA-C 03/26/15 1728  Doug SouSam Jacubowitz, MD 03/26/15 1743

## 2015-03-26 NOTE — ED Notes (Signed)
Pt in c/o intermittent abd pain and diarrhea, nausea, for the last 5 weeks, has been seen by her PCP for this and had gallbladder studies completed, states since around 8pm last night pain has been more constant, pt vomited x1 this morning, pain to RUQ and LLQ, denies fever, no distress noted

## 2015-04-06 ENCOUNTER — Encounter (HOSPITAL_COMMUNITY)
Admission: RE | Admit: 2015-04-06 | Discharge: 2015-04-06 | Disposition: A | Discharge status: Home / Self Care | Payer: No Typology Code available for payment source | Source: Ambulatory Visit | Attending: Family Medicine | Admitting: Family Medicine

## 2015-04-06 DIAGNOSIS — R1011 Right upper quadrant pain: Secondary | ICD-10-CM

## 2015-04-06 MED ORDER — TECHNETIUM TC 99M MEBROFENIN IV KIT
5.0000 | PACK | Freq: Once | INTRAVENOUS | Status: AC | PRN
  Administered 2015-04-06: 5 via INTRAVENOUS

## 2015-04-06 MED ORDER — STERILE WATER FOR INJECTION IJ SOLN
INTRAMUSCULAR | Status: AC
  Filled 2015-04-06: qty 10

## 2015-04-06 MED ORDER — SINCALIDE 5 MCG IJ SOLR
INTRAMUSCULAR | Status: AC
  Filled 2015-04-06: qty 5

## 2015-04-06 MED ORDER — SINCALIDE 5 MCG IJ SOLR
0.0200 ug/kg | Freq: Once | INTRAMUSCULAR | Status: AC
  Administered 2015-04-06: 2 ug via INTRAVENOUS

## 2016-11-13 ENCOUNTER — Other Ambulatory Visit: Payer: Self-pay | Admitting: Family Medicine

## 2016-11-13 DIAGNOSIS — Z1231 Encounter for screening mammogram for malignant neoplasm of breast: Secondary | ICD-10-CM

## 2016-12-03 ENCOUNTER — Ambulatory Visit
Admission: RE | Admit: 2016-12-03 | Discharge: 2016-12-03 | Disposition: A | Discharge status: Home / Self Care | Payer: BLUE CROSS/BLUE SHIELD | Source: Ambulatory Visit | Attending: Family Medicine | Admitting: Family Medicine

## 2016-12-03 ENCOUNTER — Other Ambulatory Visit: Payer: Self-pay | Admitting: Family Medicine

## 2016-12-03 DIAGNOSIS — N6320 Unspecified lump in the left breast, unspecified quadrant: Secondary | ICD-10-CM

## 2016-12-03 DIAGNOSIS — Z1231 Encounter for screening mammogram for malignant neoplasm of breast: Secondary | ICD-10-CM

## 2016-12-11 ENCOUNTER — Encounter (HOSPITAL_COMMUNITY): Payer: Self-pay | Admitting: *Deleted

## 2016-12-11 ENCOUNTER — Emergency Department (HOSPITAL_COMMUNITY)
Admission: EM | Admit: 2016-12-11 | Discharge: 2016-12-11 | Disposition: A | Discharge status: Home / Self Care | Payer: BLUE CROSS/BLUE SHIELD | Attending: Emergency Medicine | Admitting: Emergency Medicine

## 2016-12-11 DIAGNOSIS — J029 Acute pharyngitis, unspecified: Secondary | ICD-10-CM | POA: Diagnosis not present

## 2016-12-11 DIAGNOSIS — R05 Cough: Secondary | ICD-10-CM | POA: Diagnosis not present

## 2016-12-11 DIAGNOSIS — M791 Myalgia: Secondary | ICD-10-CM | POA: Insufficient documentation

## 2016-12-11 DIAGNOSIS — R509 Fever, unspecified: Secondary | ICD-10-CM | POA: Diagnosis present

## 2016-12-11 DIAGNOSIS — R6889 Other general symptoms and signs: Secondary | ICD-10-CM

## 2016-12-11 MED ORDER — BENZONATATE 200 MG PO CAPS: 200.0000 mg | ORAL_CAPSULE | Freq: Three times a day (TID) | ORAL | 0 refills | Status: DC | PRN

## 2016-12-11 MED ORDER — OSELTAMIVIR PHOSPHATE 75 MG PO CAPS: 75.0000 mg | ORAL_CAPSULE | Freq: Two times a day (BID) | ORAL | 0 refills | Status: DC

## 2016-12-11 NOTE — ED Triage Notes (Signed)
Pt comes in with cough, congestion, body aches and sore throat starting yesterday. Denies any n/v/d.

## 2016-12-11 NOTE — ED Notes (Signed)
Pt given water to drink. 

## 2016-12-11 NOTE — Discharge Instructions (Signed)
Take Tylenol every 4 hours as needed for fever or body aches. Follow-up with your primary doctor for recheck if needed

## 2016-12-11 NOTE — ED Notes (Signed)
Pt made aware to return if symptoms worsen or if any life threatening symptoms occur.   

## 2016-12-11 NOTE — ED Provider Notes (Signed)
[facility] Provider Note   CSN: [id number] Arrival date & time: 12/11/16  1228     History   Chief Complaint Chief Complaint  Patient presents with  . Flu-like Symptoms    HPI Hannah Lynch is a 46 y.o. female.  HPI   Hannah Lynch is a 46 y.o. female who presents to the Emergency Department complaining of Sudden onset of generalized body aches, fever, chills, cough and sore throat.  Symptoms began one day prior to arrival. She states that her grandson has similar symptoms. Cough has been nonproductive and persistent with exertion and speaking. She's tried over-the-counter cough and cold medications without relief. She denies shortness of breath, chest pain, nausea or vomiting, dysuria and rash.  Past Medical History:  Diagnosis Date  . Anxiety   . Complication of anesthesia   . DDD (degenerative disc disease)   . Depression   . Family history of adverse reaction to anesthesia    nausea and vomiting  . PONV (postoperative nausea and vomiting)     Patient Active Problem List   Diagnosis Date Noted  . Lumbar stenosis with neurogenic claudication 09/25/2014    Past Surgical History:  Procedure Laterality Date  . ABDOMINAL HYSTERECTOMY  2005  . ANKLE SURGERY Right 1993 and 2005  . BACK SURGERY  2006  . KNEE SURGERY  2012  . MAXIMUM ACCESS (MAS)POSTERIOR LUMBAR INTERBODY FUSION (PLIF) 1 LEVEL N/A 09/25/2014   Procedure: Lumbar four-five MAXIMUM ACCESS (MAS) POSTERIOR LUMBAR INTERBODY FUSION (PLIF) 1 LEVEL;  Surgeon: Maeola Harman, MD;  Location: [facility];  Service: Neurosurgery;  Laterality: N/A;  . NECK SURGERY  2008 and 2013    OB History    No data available       Home Medications    Prior to Admission medications   Medication Sig Start Date End Date Taking? Authorizing Provider  citalopram (CELEXA) 20 MG tablet Take 20 mg by mouth daily.    Historical Provider, MD  estradiol (ESTRACE) 2 MG tablet Take 2 mg by mouth daily.    Historical Provider, MD    HYDROcodone-acetaminophen (NORCO/VICODIN) 5-325 MG per tablet Take 1-2 tablets by mouth every 6 (six) hours as needed for moderate pain or severe pain.    Historical Provider, MD  LORazepam (ATIVAN) 0.5 MG tablet Take 0.5 mg by mouth every 8 (eight) hours as needed for anxiety.    Historical Provider, MD  methocarbamol (ROBAXIN) 500 MG tablet Take 1 tablet (500 mg total) by mouth every 6 (six) hours as needed for muscle spasms. Patient not taking: Reported on 03/26/2015 09/26/14   Maeola Harman, MD  omeprazole (PRILOSEC) 20 MG capsule Take 20 mg by mouth daily.    Historical Provider, MD  oxyCODONE-acetaminophen (PERCOCET/ROXICET) 5-325 MG per tablet Take 1-2 tablets by mouth every 4 (four) hours as needed for moderate pain or severe pain. Patient not taking: Reported on 03/26/2015 09/26/14   Maeola Harman, MD    Family History Family History  Problem Relation Age of Onset  . Hypertension Mother   . Diabetes Mother   . Heart failure Father   . Hypertension Father   . Diabetes Father   . Heart failure Other     Social History Social History  Substance Use Topics  . Smoking status: Never Smoker  . Smokeless tobacco: Never Used  . Alcohol use Yes     Comment: socially     Allergies   Patient has no known allergies.   Review of Systems  Review of Systems  Constitutional: Positive for chills and fever. Negative for activity change and appetite change.  HENT: Positive for congestion, rhinorrhea and sore throat. Negative for facial swelling and trouble swallowing.   Eyes: Negative for visual disturbance.  Respiratory: Positive for cough. Negative for chest tightness, shortness of breath, wheezing and stridor.   Gastrointestinal: Negative for abdominal pain, nausea and vomiting.  Genitourinary: Negative for dysuria and flank pain.  Musculoskeletal: Positive for myalgias. Negative for neck pain and neck stiffness.  Skin: Negative.   Neurological: Negative for dizziness, weakness,  numbness and headaches.  Hematological: Negative for adenopathy.  Psychiatric/Behavioral: Negative for confusion.  All other systems reviewed and are negative.    Physical Exam Updated Vital Signs BP 142/90 (BP Location: Left Arm)   Pulse 114   Temp 98.5 F (36.9 C) (Oral)   Resp 20   Ht 5\' 5"  (1.651 m)   Wt 91.6 kg   SpO2 99%   BMI 33.61 kg/m   Physical Exam  Constitutional: She is oriented to person, place, and time. She appears well-developed and well-nourished. No distress.  HENT:  Head: Normocephalic and atraumatic.  Right Ear: Tympanic membrane and ear canal normal.  Left Ear: Tympanic membrane and ear canal normal.  Nose: Mucosal edema and rhinorrhea present.  Mouth/Throat: Uvula is midline and mucous membranes are normal. No trismus in the jaw. No uvula swelling. Posterior oropharyngeal erythema present. No oropharyngeal exudate, posterior oropharyngeal edema or tonsillar abscesses.  Eyes: Conjunctivae are normal.  Neck: Normal range of motion and phonation normal. Neck supple. No Brudzinski's sign and no Kernig's sign noted.  Cardiovascular: Normal rate, regular rhythm and intact distal pulses.   No murmur heard. Pulmonary/Chest: Effort normal and breath sounds normal. No respiratory distress. She has no wheezes. She has no rales.  Abdominal: Soft. She exhibits no distension. There is no tenderness. There is no rebound and no guarding.  Musculoskeletal: Normal range of motion. She exhibits no edema.  Lymphadenopathy:    She has no cervical adenopathy.  Neurological: She is alert and oriented to person, place, and time. She exhibits normal muscle tone. Coordination normal.  Skin: Skin is warm and dry.  Nursing note and vitals reviewed.    ED Treatments / Results  Labs (all labs ordered are listed, but only abnormal results are displayed) Labs Reviewed - No data to display  EKG  EKG Interpretation None       Radiology No results  found.  Procedures Procedures (including critical care time)  Medications Ordered in ED Medications - No data to display   Initial Impression / Assessment and Plan / ED Course  I have reviewed the triage vital signs and the nursing notes.  Pertinent labs & imaging results that were available during my care of the patient were reviewed by me and considered in my medical decision making (see chart for details).     Patient is nontoxic appearing. Vital signs are stable. Recent exposure to influenza. Patient's symptoms are likely viral. She agrees to symptomatic treatment, proper hydration, and PCP follow-up if needed.  Final Clinical Impressions(s) / ED Diagnoses   Final diagnoses:  Flu-like symptoms    New Prescriptions Discharge Medication List as of 12/11/2016  1:53 PM    START taking these medications   Details  benzonatate (TESSALON) 200 MG capsule Take 1 capsule (200 mg total) by mouth 3 (three) times daily as needed for cough. Swallow whole, do not chew, Starting Thu 12/11/2016, [employer]    oseltamivir (  TAMIFLU) 75 MG capsule Take 1 capsule (75 mg total) by mouth every 12 (twelve) hours., Starting Thu 12/11/2016, [employer]         Tammy Triplett, PA-C 12/13/16 1153    Mancel BaleElliott Wentz, MD 12/15/16 1054

## 2017-03-11 ENCOUNTER — Ambulatory Visit (INDEPENDENT_AMBULATORY_CARE_PROVIDER_SITE_OTHER): Payer: Self-pay | Admitting: Orthopaedic Surgery

## 2017-03-12 ENCOUNTER — Ambulatory Visit (INDEPENDENT_AMBULATORY_CARE_PROVIDER_SITE_OTHER): Payer: Self-pay | Admitting: Orthopaedic Surgery

## 2017-03-18 ENCOUNTER — Encounter (INDEPENDENT_AMBULATORY_CARE_PROVIDER_SITE_OTHER): Payer: Self-pay | Admitting: Orthopaedic Surgery

## 2017-03-18 ENCOUNTER — Ambulatory Visit (INDEPENDENT_AMBULATORY_CARE_PROVIDER_SITE_OTHER): Payer: BLUE CROSS/BLUE SHIELD

## 2017-03-18 ENCOUNTER — Ambulatory Visit (INDEPENDENT_AMBULATORY_CARE_PROVIDER_SITE_OTHER): Payer: BLUE CROSS/BLUE SHIELD | Admitting: Orthopaedic Surgery

## 2017-03-18 VITALS — BP 125/85 | HR 78 | Ht 65.0 in | Wt 185.0 lb

## 2017-03-18 DIAGNOSIS — M25561 Pain in right knee: Secondary | ICD-10-CM | POA: Diagnosis not present

## 2017-03-18 DIAGNOSIS — G8929 Other chronic pain: Secondary | ICD-10-CM

## 2017-03-18 MED ORDER — BUPIVACAINE HCL 0.5 % IJ SOLN
3.0000 mL | INTRAMUSCULAR | Status: AC | PRN
  Administered 2017-03-18: 3 mL via INTRA_ARTICULAR

## 2017-03-18 MED ORDER — LIDOCAINE HCL 1 % IJ SOLN
5.0000 mL | INTRAMUSCULAR | Status: AC | PRN
  Administered 2017-03-18: 5 mL

## 2017-03-18 MED ORDER — METHYLPREDNISOLONE ACETATE 40 MG/ML IJ SUSP
80.0000 mg | INTRAMUSCULAR | Status: AC | PRN
  Administered 2017-03-18: 80 mg

## 2017-03-18 NOTE — Progress Notes (Signed)
Office Visit Note   Patient: Hannah Lynch           Date of Birth: 1971-10-20           MRN: [id number] Visit Date: 03/18/2017              Requested by: Joette Catching, MD [address] [place], [place] [address] PCP: Joette Catching, MD   Assessment & Plan: Visit Diagnoses:  1. 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 Monitor her response over the next 4-6 weeks  Follow-Up Instructions: No Follow-up on file.   Orders:  Orders Placed This Encounter  Procedures  . XR KNEE 3 VIEW RIGHT   No orders of the defined types were placed in this encounter.     Procedures: Large Joint Inj Date/Time: 03/18/2017 12:57 PM Performed by: Valeria Batman Authorized by: Valeria Batman   Consent Given by:  Patient Timeout: prior to procedure the correct patient, procedure, and site was verified   Indications:  Pain and joint swelling Location:  Knee Site:  R knee Prep: patient was prepped and draped in usual sterile fashion   Needle Size:  25 G Needle Length:  1.5 inches Approach:  Anteromedial Ultrasound Guidance: No   Fluoroscopic Guidance: No   Arthrogram: No   Medications:  5 mL lidocaine 1 %; 80 mg methylPREDNISolone acetate 40 MG/ML; 3 mL bupivacaine 0.5 % Aspiration Attempted: No   Patient tolerance:  Patient tolerated the procedure well with no immediate complications     Clinical Data: No additional findings.   Subjective: Chief Complaint  Patient presents with  . Right Knee - Pain, Edema    Hannah Lynch is a 46 y o that presents with Right knee pain x 2 month pain has become increasingly worse,  trouble sleeping and going up and down stairs is painful Hx of Right knee arthroscopic in 2012  Insidious onset of right knee pain as mentioned above. No history of injury or trauma. Did well after knee arthroscopy in 2012 for a tear of the medial meniscus. Pain is  predominantly along the medial aspect of her knee. No popping or clicking or locking. No numbness or tingling or thigh discomfort.  HPI  Review of Systems   Objective: Vital Signs: BP 125/85   Pulse 78   Ht 5\' 5"  (1.651 m)   Wt 185 lb (83.9 kg)   BMI 30.79 kg/m   Physical Exam  Ortho Exam right knee without effusion. The knee was not hot warm or red. No opening with a varus or valgus stress full extension and overall 100 of flexion. No popliteal pain. No calf discomfort. Neurovascular exam intact distally  Specialty Comments:  No specialty comments available.  Imaging: No results found.   PMFS History: Patient Active Problem List   Diagnosis Date Noted  . Lumbar stenosis with neurogenic claudication 09/25/2014   Past Medical History:  Diagnosis Date  . Anxiety   . Complication of anesthesia   . DDD (degenerative disc disease)   . Depression   . Family history of adverse reaction to anesthesia    nausea and vomiting  . PONV (postoperative nausea and vomiting)     Family History  Problem Relation Age of Onset  . Hypertension Mother   . Diabetes Mother   . Heart failure Father   . Hypertension Father   . Diabetes Father   .  Heart failure Other     Past Surgical History:  Procedure Laterality Date  . ABDOMINAL HYSTERECTOMY  2005  . ANKLE SURGERY Right 1993 and 2005  . BACK SURGERY  2006  . KNEE SURGERY  2012  . MAXIMUM ACCESS (MAS)POSTERIOR LUMBAR INTERBODY FUSION (PLIF) 1 LEVEL N/A 09/25/2014   Procedure: Lumbar four-five MAXIMUM ACCESS (MAS) POSTERIOR LUMBAR INTERBODY FUSION (PLIF) 1 LEVEL;  Surgeon: Maeola HarmanJoseph Stern, MD;  Location: [facility];  Service: Neurosurgery;  Laterality: N/A;  . NECK SURGERY  2008 and 2013   Social History   Occupational History  . Not on file.   Social History Main Topics  . Smoking status: Never Smoker  . Smokeless tobacco: Never Used  . Alcohol use Yes     Comment: socially  . Drug use: No  . Sexual activity: Yes     Birth control/ protection: None     Valeria BatmanPeter W Whitfield, MD   Note - This record has been created using [employer] software.  Chart creation errors have been sought, but may not always  have been located. Such creation errors do not reflect on  the standard of medical care.

## 2017-06-25 DIAGNOSIS — H9193 Unspecified hearing loss, bilateral: Secondary | ICD-10-CM | POA: Insufficient documentation

## 2017-06-25 DIAGNOSIS — Z124 Encounter for screening for malignant neoplasm of cervix: Secondary | ICD-10-CM | POA: Insufficient documentation

## 2017-06-25 DIAGNOSIS — H6500 Acute serous otitis media, unspecified ear: Secondary | ICD-10-CM | POA: Insufficient documentation

## 2017-06-25 DIAGNOSIS — F5101 Primary insomnia: Secondary | ICD-10-CM | POA: Insufficient documentation

## 2017-06-30 ENCOUNTER — Emergency Department (HOSPITAL_COMMUNITY)
Admission: EM | Admit: 2017-06-30 | Discharge: 2017-07-01 | Disposition: A | Discharge status: Home / Self Care | Payer: BLUE CROSS/BLUE SHIELD | Attending: Emergency Medicine | Admitting: Emergency Medicine

## 2017-06-30 ENCOUNTER — Emergency Department (HOSPITAL_COMMUNITY): Payer: BLUE CROSS/BLUE SHIELD

## 2017-06-30 ENCOUNTER — Encounter (HOSPITAL_COMMUNITY): Payer: Self-pay | Admitting: Emergency Medicine

## 2017-06-30 DIAGNOSIS — R0602 Shortness of breath: Secondary | ICD-10-CM | POA: Diagnosis not present

## 2017-06-30 DIAGNOSIS — R079 Chest pain, unspecified: Secondary | ICD-10-CM | POA: Diagnosis not present

## 2017-06-30 DIAGNOSIS — R42 Dizziness and giddiness: Secondary | ICD-10-CM | POA: Insufficient documentation

## 2017-06-30 DIAGNOSIS — Z79899 Other long term (current) drug therapy: Secondary | ICD-10-CM | POA: Insufficient documentation

## 2017-06-30 LAB — CBC
HCT: 39.2 % (ref 36.0–46.0)
Hemoglobin: 13.7 g/dL (ref 12.0–15.0)
MCH: 33.2 pg (ref 26.0–34.0)
MCHC: 34.9 g/dL (ref 30.0–36.0)
MCV: 94.9 fL (ref 78.0–100.0)
Platelets: 220 10*3/uL (ref 150–400)
RBC: 4.13 MIL/uL (ref 3.87–5.11)
RDW: 12.7 % (ref 11.5–15.5)
WBC: 8.8 10*3/uL (ref 4.0–10.5)

## 2017-06-30 LAB — BASIC METABOLIC PANEL
Anion gap: 5 (ref 5–15)
BUN: 14 mg/dL (ref 6–20)
CO2: 28 mmol/L (ref 22–32)
Calcium: 8.9 mg/dL (ref 8.9–10.3)
Chloride: 104 mmol/L (ref 101–111)
Creatinine, Ser: 0.84 mg/dL (ref 0.44–1.00)
GFR calc Af Amer: >60 mL/min (ref >60)
GFR calc non Af Amer: >60 mL/min (ref >60)
Glucose, Bld: 106 mg/dL — ABNORMAL HIGH (ref 65–99)
Potassium: 3.7 mmol/L (ref 3.5–5.1)
Sodium: 137 mmol/L (ref 135–145)

## 2017-06-30 LAB — I-STAT TROPONIN, ED: Troponin i, poc: 0.01 ng/mL (ref 0.00–0.08)

## 2017-06-30 LAB — D-DIMER, QUANTITATIVE: D-Dimer, Quant: 0.36 ug/mL-FEU (ref 0.00–0.50)

## 2017-06-30 MED ORDER — ASPIRIN 325 MG PO TABS
325.0000 mg | ORAL_TABLET | Freq: Once | ORAL | Status: AC
  Administered 2017-06-30: 325 mg via ORAL
  Filled 2017-06-30: qty 1

## 2017-06-30 MED ORDER — MORPHINE SULFATE (PF) 4 MG/ML IV SOLN
4.0000 mg | Freq: Once | INTRAVENOUS | Status: AC
  Administered 2017-06-30: 4 mg via INTRAVENOUS
  Filled 2017-06-30: qty 1

## 2017-06-30 MED ORDER — LORAZEPAM 2 MG/ML IJ SOLN
1.0000 mg | Freq: Once | INTRAMUSCULAR | Status: AC
  Administered 2017-07-01: 1 mg via INTRAVENOUS
  Filled 2017-06-30: qty 1

## 2017-06-30 NOTE — ED Triage Notes (Signed)
Patient start having left-sided chest pain around an hour ago with shortness of breath.

## 2017-06-30 NOTE — ED Provider Notes (Signed)
[facility] Provider Note   CSN: [id number] Arrival date & time: 06/30/17  2109     History   Chief Complaint Chief Complaint  Patient presents with  . Chest Pain    HPI Hannah Lynch is a 46 y.o. female.  The history is provided by the patient.  Chest Pain   This is a new problem. The current episode started 1 to 2 hours ago. The problem occurs constantly. The problem has not changed since onset.The pain is associated with rest. The pain is present in the lateral region. The pain is moderate. The quality of the pain is described as sharp. The pain radiates to the upper back. Associated symptoms include shortness of breath. Pertinent negatives include no abdominal pain, no diaphoresis, no exertional chest pressure, no fever, no leg pain, no lower extremity edema, no nausea, no near-syncope, no numbness, no syncope and no vomiting. She has tried nothing for the symptoms. The treatment provided no relief.     46 year old female who presents with chest pain. She reports onset of chest pain radiating towards her back or she was driving about 1 hour prior to arrival. Has not had chest pain before. Since that pain is localized under the left breast and radiates towards the scapula. It is associated with mild shortness of breath and dizziness. No syncope, near syncope, nausea or vomiting, diaphoresis. No aggravating or alleviating factors. No lower extremity edema or calf tenderness. Does report father with MI at age of 79. Denies personal or family history of PE/DVT. No recent immobilization or exogenous hormones. Denies abdominal pain.  Past Medical History:  Diagnosis Date  . Anxiety   . Complication of anesthesia   . DDD (degenerative disc disease)   . Depression   . Family history of adverse reaction to anesthesia    nausea and vomiting  . PONV (postoperative nausea and vomiting)     Patient Active Problem List   Diagnosis Date Noted  . Lumbar stenosis with neurogenic  claudication 09/25/2014    Past Surgical History:  Procedure Laterality Date  . ABDOMINAL HYSTERECTOMY  2005  . ANKLE SURGERY Right 1993 and 2005  . BACK SURGERY  2006  . KNEE SURGERY  2012  . MAXIMUM ACCESS (MAS)POSTERIOR LUMBAR INTERBODY FUSION (PLIF) 1 LEVEL N/A 09/25/2014   Procedure: Lumbar four-five MAXIMUM ACCESS (MAS) POSTERIOR LUMBAR INTERBODY FUSION (PLIF) 1 LEVEL;  Surgeon: Maeola Harman, MD;  Location: [facility];  Service: Neurosurgery;  Laterality: N/A;  . NECK SURGERY  2008 and 2013    OB History    No data available       Home Medications    Prior to Admission medications   Medication Sig Start Date End Date Taking? Authorizing Provider  amoxicillin (AMOXIL) 500 MG capsule Take 500 mg by mouth 3 (three) times daily. 10 day course starting on 06/25/2017 06/25/17  Yes [provider]  citalopram (CELEXA) 20 MG tablet Take 20 mg by mouth daily.   Yes [provider]  levothyroxine (SYNTHROID, LEVOTHROID) 25 MCG tablet Take by mouth. 01/23/17 01/23/18 Yes [provider]  meloxicam (MOBIC) 15 MG tablet Take 15 mg by mouth daily as needed for pain.  02/05/17  Yes [provider]  SUMAtriptan (IMITREX) 50 MG tablet Take 50 mg by mouth every 2 (two) hours as needed for migraine. May repeat in 2 hours if headache persists or recurs.   Yes [provider]  zolpidem (AMBIEN) 10 MG tablet Take 10 mg by mouth  at bedtime as needed. 06/25/17  Yes [provider]    Family History Family History  Problem Relation Age of Onset  . Hypertension Mother   . Diabetes Mother   . Heart failure Father   . Hypertension Father   . Diabetes Father   . Heart failure Other     Social History Social History  Substance Use Topics  . Smoking status: Never Smoker  . Smokeless tobacco: Never Used  . Alcohol use Yes     Comment: socially     Allergies   Patient has no known allergies.   Review of Systems Review of Systems    Constitutional: Negative for diaphoresis and fever.  Respiratory: Positive for shortness of breath.   Cardiovascular: Positive for chest pain. Negative for syncope and near-syncope.  Gastrointestinal: Negative for abdominal pain, nausea and vomiting.  Neurological: Negative for numbness.  All other systems reviewed and are negative.    Physical Exam Updated Vital Signs BP 108/74   Pulse 85   Temp 98.3 F (36.8 C) (Oral)   Resp 14   Ht 5\' 5"  (1.651 m)   Wt 97.1 kg (214 lb)   SpO2 94%   BMI 35.61 kg/m   Physical Exam Physical Exam  Nursing note and vitals reviewed. Constitutional: Well developed, well nourished, non-toxic, and in no acute distress Head: Normocephalic and atraumatic.  Mouth/Throat: Oropharynx is clear and moist.  Neck: Normal range of motion. Neck supple.  Cardiovascular: Normal rate and regular rhythm.   Pulmonary/Chest: Effort normal and breath sounds normal.  Abdominal: Soft. There is no tenderness. There is no rebound and no guarding.  Musculoskeletal: Normal range of motion. no calf tenderness or lower extremity edema Neurological: Alert, no facial droop, fluent speech, moves all extremities symmetrically Skin: Skin is warm and dry.  Psychiatric: Cooperative   ED Treatments / Results  Labs (all labs ordered are listed, but only abnormal results are displayed) Labs Reviewed  BASIC METABOLIC PANEL - Abnormal; Notable for the following:       Result Value   Glucose, Bld 106 (*)    All other components within normal limits  CBC  D-DIMER, QUANTITATIVE (NOT AT [facility])  I-STAT TROPONIN, ED  I-STAT TROPONIN, ED    EKG  EKG Interpretation  Date/Time:  Tuesday June 30 2017 21:18:46 EDT Ventricular Rate:  93 PR Interval:    QRS Duration: 97 QT Interval:  333 QTC Calculation: 415 R Axis:   18 Text Interpretation:  Sinus rhythm Low voltage, precordial leads Abnormal R-wave progression, early transition no acute ischemic changes  Confirmed by Crista Curb [phone]) on 06/30/2017 9:55:39 PM       Radiology Dg Chest 2 View  Result Date: 06/30/2017 CLINICAL DATA:  Chest pain and shortness of breath EXAM: CHEST  2 VIEW COMPARISON:  September 29, 2011 FINDINGS: There is no edema or consolidation. Heart size and pulmonary vascularity are normal. No adenopathy. There is postoperative change in the lower cervical spine region. IMPRESSION: No edema or consolidation. Electronically Signed   By: Bretta Bang III M.D.   On: 06/30/2017 21:46    Procedures Procedures (including critical care time)  Medications Ordered in ED Medications  morphine 4 MG/ML injection 4 mg (4 mg Intravenous Given 06/30/17 2230)  aspirin tablet 325 mg (325 mg Oral Given 06/30/17 2234)  LORazepam (ATIVAN) injection 1 mg (1 mg Intravenous Given 07/01/17 0001)     Initial Impression / Assessment and Plan / ED Course  I have reviewed  the triage vital signs and the nursing notes.  Pertinent labs & imaging results that were available during my care of the patient were reviewed by me and considered in my medical decision making (see chart for details).     46 year old female who presents with atypical chest pain. Her vital signs are within normal limits and she is afebrile. Chest pain is not reproducible. Features do seem atypical for ACS and she does not have any significant risk factors other than her obesity and a family history. Her initial EKG is nonischemic. Initial troponin is normal. Did screen for blood clot, and her d-dimer is negative. History is not concerning for dissection, she does not have any significant risk factors. She did receive aspirin, morphine with some relief in her symptoms. She does have significant anxiety depression history, and will try a course of Ativan as well. We'll obtain serial troponin. If negative I do feel she is stable for discharge home with PCP follow-up. Results are signed out to Dr. Bebe Shaggy.  Final Clinical Impressions(s) / ED  Diagnoses   Final diagnoses:  Nonspecific chest pain    New Prescriptions Discharge Medication List as of 07/01/2017  1:37 AM       Lavera Guise, MD 07/01/17 1331

## 2017-06-30 NOTE — ED Notes (Signed)
Patient states that she is currently having pain on left side of chest and middle of her back. Patient resting on stretcher with no sign of distress at this time.

## 2017-07-01 LAB — I-STAT TROPONIN, ED: Troponin i, poc: 0 ng/mL (ref 0.00–0.08)

## 2017-07-01 NOTE — ED Provider Notes (Signed)
I assumed care at signout to f/u on troponin Repeat labs unremarkable Pt improved I have reviewed EKG Will d/c home Pt agreeable with plan    Zadie Rhine, MD 07/01/17 [phone]

## 2017-07-01 NOTE — Discharge Instructions (Signed)
Your heart work-up was reassuring. You are ruled out for blood clot. Your Chest x-ray is reassuring.  Please follow-up closely with your primary care doctor to discuss need for further testing, such as stress test.  Please return without fail for worsening symptoms, including worsening pain, difficulty breathing, passing out, or any other symptoms concerning to you.

## 2017-07-01 NOTE — ED Notes (Signed)
Pt ambulatory to waiting room. Pt verbalized understanding of discharge instructions.   

## 2017-07-08 ENCOUNTER — Ambulatory Visit (INDEPENDENT_AMBULATORY_CARE_PROVIDER_SITE_OTHER): Payer: BLUE CROSS/BLUE SHIELD | Admitting: Orthopaedic Surgery

## 2017-07-08 ENCOUNTER — Encounter (INDEPENDENT_AMBULATORY_CARE_PROVIDER_SITE_OTHER): Payer: Self-pay | Admitting: Orthopaedic Surgery

## 2017-07-08 ENCOUNTER — Ambulatory Visit (INDEPENDENT_AMBULATORY_CARE_PROVIDER_SITE_OTHER): Payer: Self-pay

## 2017-07-08 VITALS — Resp 14 | Ht 65.0 in | Wt 214.0 lb

## 2017-07-08 DIAGNOSIS — M25571 Pain in right ankle and joints of right foot: Secondary | ICD-10-CM

## 2017-07-08 DIAGNOSIS — M79671 Pain in right foot: Secondary | ICD-10-CM | POA: Diagnosis not present

## 2017-07-08 MED ORDER — LIDOCAINE HCL 1 % IJ SOLN
1.0000 mL | INTRAMUSCULAR | Status: AC | PRN
  Administered 2017-07-08: 1 mL

## 2017-07-08 MED ORDER — METHYLPREDNISOLONE ACETATE 40 MG/ML IJ SUSP
40.0000 mg | INTRAMUSCULAR | Status: AC | PRN
  Administered 2017-07-08: 40 mg

## 2017-07-08 MED ORDER — BUPIVACAINE HCL 0.5 % IJ SOLN
1.0000 mL | INTRAMUSCULAR | Status: AC | PRN
  Administered 2017-07-08: 1 mL

## 2017-07-08 NOTE — Progress Notes (Signed)
Office Visit Note   Patient: Hannah Lynch           Date of Birth: [birth year]           MRN: [id number] Visit Date: 07/08/2017              Requested by: Joette Catching, MD [address] [place], [place] [address] PCP: Joette Catching, MD   Assessment & Plan: Visit Diagnoses:  1. Pain in right ankle and joints of right foot   2. Right foot pain     Plan: Localized tenderness over the anterior talofibular ligament area. We'll plan on injecting that as a diagnostic and treatment modality and evaluate over time. Appears to be soft tissue injury with possible strain of the anterior talofibular ligament. Could be related to some early arthritis in the lateral aspect of the ankle from prior open reduction internal fixation of ankle fracture in 1993. Hardware appears to be intact  Follow-Up Instructions: Return if symptoms worsen or fail to improve.   Orders:  Orders Placed This Encounter  Procedures  . XR Ankle Complete Right   No orders of the defined types were placed in this encounter.     Procedures: Foot Inj Date/Time: 07/08/2017 10:16 AM Performed by: Valeria Batman Authorized by: Valeria Batman   Needle Size:  27 G Approach:  Anterolateral Medications:  1 mL lidocaine 1 %; 1 mL bupivacaine 0.5 %; 40 mg methylPREDNISolone acetate 40 MG/ML  Pain over anterior lateral anle     Clinical Data: No additional findings.   Subjective: Chief Complaint  Patient presents with  . Right Ankle - Pain  Hannah Lynch status post open reduction internal fixation of a displaced medial malleolus fracture in 1993. She's done very well. About 6 weeks ago she experienced insidious onset of right ankle pain without injury or trauma. She's not had any swelling or ecchymosis. She's been localized in the pain along the lateral aspect of the ankle near the anterior talofibular ligament. There has  been no numbness or tingling.  HPI  Review of Systems  Constitutional: Negative for  chills, fatigue and fever.  Eyes: Negative for itching.  Respiratory: Negative for chest tightness and shortness of breath.   Cardiovascular: Negative for chest pain, palpitations and leg swelling.  Gastrointestinal: Negative for blood in stool, constipation and diarrhea.  Musculoskeletal: Negative for back pain, joint swelling, neck pain and neck stiffness.  Neurological: Negative for dizziness, weakness, numbness and headaches.  Hematological: Does not bruise/bleed easily.  Psychiatric/Behavioral: Negative for sleep disturbance. The patient is not nervous/anxious.      Objective: Vital Signs: Resp 14   Ht 5\' 5"  (1.651 m)   Wt 214 lb (97.1 kg)   BMI 35.61 kg/m   Physical Exam  Ortho Exam right ankle well-healed incisions over the fibula and medial malleolus. No palpable hardware. Skin intact. No ecchymosis. No obvious swelling or induration. No redness. Neurovascular exam intact. Some tenderness in the area of the anterior talofibular ligament. That might be a ganglion cyst no further distally over the talonavicular region but no tenderness. No loss of motion of the ankle or crepitation. No evidence of obvious increased talar tilt  Specialty Comments:  No specialty comments available.  Imaging: Xr Ankle Complete Right  Result Date: 07/08/2017 Films of the right ankle were obtained in 3 projections. A previously fixed distal fibula and medial malleolus fractures are identified. Hardware is intact. No evidence of new fracture. Ankle joint appears to be intact with  minimal degenerative change. No ectopic calcification. Without obvious abnormality along the distal fibula where the patient is symptomatic    PMFS History: Patient Active Problem List   Diagnosis Date Noted  . Lumbar stenosis with neurogenic claudication 09/25/2014   Past Medical History:  Diagnosis Date  . Anxiety   . Complication of anesthesia   . DDD (degenerative disc disease)   . Depression   . Family  history of adverse reaction to anesthesia    nausea and vomiting  . PONV (postoperative nausea and vomiting)     Family History  Problem Relation Age of Onset  . Hypertension Mother   . Diabetes Mother   . Heart failure Father   . Hypertension Father   . Diabetes Father   . Heart failure Other     Past Surgical History:  Procedure Laterality Date  . ABDOMINAL HYSTERECTOMY  2005  . ANKLE SURGERY Right 1993 and 2005  . BACK SURGERY  2006  . KNEE SURGERY  2012  . MAXIMUM ACCESS (MAS)POSTERIOR LUMBAR INTERBODY FUSION (PLIF) 1 LEVEL N/A 09/25/2014   Procedure: Lumbar four-five MAXIMUM ACCESS (MAS) POSTERIOR LUMBAR INTERBODY FUSION (PLIF) 1 LEVEL;  Surgeon: Maeola HarmanJoseph Stern, MD;  Location: [facility];  Service: Neurosurgery;  Laterality: N/A;  . NECK SURGERY  2008 and 2013   Social History   Occupational History  . Not on file.   Social History Main Topics  . Smoking status: Never Smoker  . Smokeless tobacco: Never Used  . Alcohol use Yes     Comment: socially  . Drug use: No  . Sexual activity: Yes    Birth control/ protection: None

## 2017-08-12 ENCOUNTER — Ambulatory Visit (INDEPENDENT_AMBULATORY_CARE_PROVIDER_SITE_OTHER): Payer: BLUE CROSS/BLUE SHIELD | Admitting: Orthopaedic Surgery

## 2017-08-12 VITALS — Resp 14 | Ht 62.0 in | Wt 214.0 lb

## 2017-08-12 DIAGNOSIS — M25571 Pain in right ankle and joints of right foot: Secondary | ICD-10-CM | POA: Diagnosis not present

## 2017-08-12 NOTE — Progress Notes (Signed)
Office Visit Note   Patient: Hannah Lynch           Date of Birth: 14-Feb-1971           MRN: [id number] Visit Date: 08/12/2017              Requested by: Joette Catching, MD [address] [place], [place] [address] PCP: Joette Catching, MD   Assessment & Plan: Visit Diagnoses:  1. 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 We'll consider an MRI scan of her knee at some point in the future. Follow-Up Instructions: Return after MRI right ankle.   Orders:  No orders of the defined types were placed in this encounter.  No orders of the defined types were placed in this encounter.     Procedures: No procedures performed   Clinical Data: No additional findings.   Subjective: No chief complaint on file. [name] was seen just over a month ago for evaluation of right knee and right ankle pain. She's had a prior injury in 1993 requiring open reduction internal fixation of her right ankle. She asked he had done well up until just recently when she without trauma or injury is been experiencing some lateral right ankle pain x-rays are negative the fracture is healed. Her pain is localized along the lateral aspect of the ankle in the area of the anterior talofibular ligament. She doesn't appear to have any increased tilt compared to the opposite side and clinically. At a local cortisone injection really wasn't that helpful. She may have a little bit of swelling. No pain behind either malleolus or along the anterior aspect of her ankle.  Right knee pain has been somewhat progressive. She's had a prior knee arthroscopy performed in [place] years ago for a "torn cartilage". Don't have any of the notes are not sure if there was any  other pathology but her pain seems to be localized more along the anterior and anteromedial aspect of her knee. Again no injury or trauma.  HPI  Review of Systems   Objective: Vital Signs: There were no vitals taken for this visit.  Physical Exam  Ortho Exam right ankle was some puffiness and swelling along the anterior lateral ankle ligaments. Negative talar tilt. No pain behind either malleolus or at the malleoli. No anterior ankle pain. No loss of dorsiflexion and plantarflexion compared to the opposite ankle. Skin intact. Neurovascular exam intact. No foot swelling. Right knee without effusion. Knee was not hot red warm or swollen. No instability. Some patellar crepitation with minimal discomfort. No significant medial joint pain or crepitation. Previous films reveal minimal decrease in the medial joint space possibly consistent with some early arthritis.  Specialty Comments:  No specialty comments available.  Imaging: No results found.   PMFS History: Patient Active Problem List   Diagnosis Date Noted  . Lumbar stenosis with neurogenic claudication 09/25/2014   Past Medical History:  Diagnosis Date  . Anxiety   . Complication of anesthesia   . DDD (degenerative disc disease)   . Depression   . Family history of adverse reaction to anesthesia    nausea and vomiting  . PONV (postoperative nausea and vomiting)     Family History  Problem Relation Age  of Onset  . Hypertension Mother   . Diabetes Mother   . Heart failure Father   . Hypertension Father   . Diabetes Father   . Heart failure Other     Past Surgical History:  Procedure Laterality Date  . ABDOMINAL HYSTERECTOMY  2005  . ANKLE SURGERY Right 1993 and 2005  . BACK SURGERY  2006  . KNEE SURGERY  2012  . MAXIMUM ACCESS (MAS)POSTERIOR LUMBAR INTERBODY FUSION (PLIF) 1 LEVEL N/A 09/25/2014   Procedure: Lumbar four-five MAXIMUM ACCESS (MAS) POSTERIOR LUMBAR INTERBODY FUSION (PLIF) 1 LEVEL;  Surgeon: Maeola Harman, MD;  Location: [facility];  Service: Neurosurgery;  Laterality: N/A;  . NECK SURGERY  2008 and 2013   Social History   Occupational History  . Not on file.   Social History Main Topics  . Smoking status: Never Smoker  . Smokeless tobacco: Never Used  . Alcohol use Yes     Comment: socially  . Drug use: No  . Sexual activity: Yes    Birth control/ protection: None     Valeria Batman, MD   Note - This record has been created using [employer].  Chart creation errors have been sought, but may not always  have been located. Such creation errors do not reflect on  the standard of medical care.

## 2017-08-19 ENCOUNTER — Ambulatory Visit (INDEPENDENT_AMBULATORY_CARE_PROVIDER_SITE_OTHER): Payer: BLUE CROSS/BLUE SHIELD | Admitting: Orthopaedic Surgery

## 2017-08-19 ENCOUNTER — Encounter (INDEPENDENT_AMBULATORY_CARE_PROVIDER_SITE_OTHER): Payer: Self-pay | Admitting: Orthopaedic Surgery

## 2017-08-19 VITALS — BP 115/82 | HR 84 | Resp 14 | Ht 62.0 in | Wt 214.0 lb

## 2017-08-19 DIAGNOSIS — M79671 Pain in right foot: Secondary | ICD-10-CM

## 2017-08-19 NOTE — Progress Notes (Signed)
Office Visit Note   Patient: Hannah Lynch           Date of Birth: July 07, 1971           MRN: [id number] Visit Date: 08/19/2017              Requested by: Joette Catching, MD [address] [place], [place] [address] PCP: Joette Catching, MD   Assessment & Plan: Visit Diagnoses:  1. Right foot pain    MRI scan demonstrates a small loculated 1.5 cm ganglion cyst along the anterior aspect arising distal both the level of the anterior talofibular ligament and extending inferiorly. There is evidence of a prior medial malleolar and distal fibula ORIF. There was minimal tibiotalar and subtalar joint degenerative changes. Evidence of prior plantar fasciitis which is asymptomatic Plan: Long discussion regarding the MRI scan findings. There is some tenderness in the area of the anterior talofibular ligament and across the ankle joint which probably consistent with the mild degenerative change. Cyst may been present for a long time after the ORIF of her ankle years ago. For the moment and she is happy knowing that the MRI scan did not demonstrate anything of significance and would require further surgery. We'll plan to see back in a when necessary basis. In the future can consider injecting the area over the ganglion cyst in the ankle joint  Follow-Up Instructions: Return if symptoms worsen or fail to improve.   Orders:  No orders of the defined types were placed in this encounter.  No orders of the defined types were placed in this encounter.     Procedures: No procedures performed   Clinical Data: No additional findings.   Subjective: No chief complaint on file. Here for findings on her MRI scan. Results noted above  HPI  Review of Systems  Constitutional: Negative for chills, fatigue and fever.  Eyes: Negative for itching.  Respiratory: Negative for chest tightness and shortness of breath.   Cardiovascular: Negative for chest pain, palpitations and leg swelling.    Gastrointestinal: Negative for blood in stool, constipation and diarrhea.  Endocrine: Negative for polyuria.  Genitourinary: Negative for dysuria.  Musculoskeletal: Negative for back pain, joint swelling, neck pain and neck stiffness.  Allergic/Immunologic: Negative for immunocompromised state.  Neurological: Negative for dizziness and numbness.  Hematological: Does not bruise/bleed easily.  Psychiatric/Behavioral: The patient is not nervous/anxious.      Objective: Vital Signs: BP 115/82   Pulse 84   Resp 14   Ht  (1.575 m)   Wt 214 lb (97.1 kg)   BMI 39.14 kg/m   Physical Exam  Ortho Exam awake alert and oriented 3 comfortable sitting. Examination of the right ankle and restraints no edema. There is some fullness in the area of the anterior talofibular and fibulocalcaneal ligaments. No mass palpated. Some very mild discomfort along the anterior aspect of the ankle without crepitation. Skin intact. Neurovascular exam intact. No pain with subtalar motion. No pain over the plantar fascia or Achilles tendon. No pain behind or at either malleolar  Specialty Comments:  No specialty comments available.  Imaging: No results found.   PMFS History: Patient Active Problem List   Diagnosis Date Noted  . Primary insomnia 06/25/2017  . Papanicolaou smear for cervical cancer screening 06/25/2017  . Bilateral hearing loss 06/25/2017  . Acute serous otitis media 06/25/2017  . Lumbar stenosis with neurogenic claudication 09/25/2014   Past Medical History:  Diagnosis Date  . Anxiety   . Complication  of anesthesia   . DDD (degenerative disc disease)   . Depression   . Family history of adverse reaction to anesthesia    nausea and vomiting  . PONV (postoperative nausea and vomiting)     Family History  Problem Relation Age of Onset  . Hypertension Mother   . Diabetes Mother   . Heart failure Father   . Hypertension Father   . Diabetes Father   . Heart failure Other      Past Surgical History:  Procedure Laterality Date  . ABDOMINAL HYSTERECTOMY  2005  . ANKLE SURGERY Right 1993 and 2005  . BACK SURGERY  2006  . KNEE SURGERY  2012  . MAXIMUM ACCESS (MAS)POSTERIOR LUMBAR INTERBODY FUSION (PLIF) 1 LEVEL N/A 09/25/2014   Procedure: Lumbar four-five MAXIMUM ACCESS (MAS) POSTERIOR LUMBAR INTERBODY FUSION (PLIF) 1 LEVEL;  Surgeon: Maeola Harman, MD;  Location: [facility];  Service: Neurosurgery;  Laterality: N/A;  . NECK SURGERY  2008 and 2013   Social History   Occupational History  . Not on file.   Social History Main Topics  . Smoking status: Never Smoker  . Smokeless tobacco: Never Used  . Alcohol use Yes     Comment: socially  . Drug use: No  . Sexual activity: Yes    Birth control/ protection: None

## 2017-08-20 ENCOUNTER — Encounter (INDEPENDENT_AMBULATORY_CARE_PROVIDER_SITE_OTHER): Payer: Self-pay | Admitting: Orthopaedic Surgery

## 2017-08-20 ENCOUNTER — Encounter (INDEPENDENT_AMBULATORY_CARE_PROVIDER_SITE_OTHER): Payer: Self-pay

## 2017-09-23 ENCOUNTER — Ambulatory Visit (INDEPENDENT_AMBULATORY_CARE_PROVIDER_SITE_OTHER): Payer: BLUE CROSS/BLUE SHIELD | Admitting: Orthopaedic Surgery

## 2017-09-23 DIAGNOSIS — Z5329 Procedure and treatment not carried out because of patient's decision for other reasons: Secondary | ICD-10-CM

## 2017-09-24 NOTE — Progress Notes (Signed)
Patient was a no-show 

## 2017-10-22 DIAGNOSIS — J343 Hypertrophy of nasal turbinates: Secondary | ICD-10-CM | POA: Insufficient documentation

## 2017-10-22 DIAGNOSIS — J342 Deviated nasal septum: Secondary | ICD-10-CM | POA: Insufficient documentation

## 2017-10-22 DIAGNOSIS — R0683 Snoring: Secondary | ICD-10-CM | POA: Insufficient documentation

## 2017-11-04 ENCOUNTER — Other Ambulatory Visit: Payer: Self-pay | Admitting: Family Medicine

## 2017-11-04 DIAGNOSIS — Z139 Encounter for screening, unspecified: Secondary | ICD-10-CM

## 2017-11-10 DIAGNOSIS — R0683 Snoring: Secondary | ICD-10-CM

## 2017-11-10 DIAGNOSIS — J342 Deviated nasal septum: Secondary | ICD-10-CM

## 2017-11-10 DIAGNOSIS — J343 Hypertrophy of nasal turbinates: Secondary | ICD-10-CM

## 2017-11-10 HISTORY — DX: Snoring: R06.83

## 2017-11-10 HISTORY — DX: Deviated nasal septum: J34.2

## 2017-11-10 HISTORY — DX: Hypertrophy of nasal turbinates: J34.3

## 2017-11-12 ENCOUNTER — Other Ambulatory Visit: Payer: Self-pay | Admitting: Otolaryngology

## 2017-11-25 ENCOUNTER — Ambulatory Visit (INDEPENDENT_AMBULATORY_CARE_PROVIDER_SITE_OTHER): Payer: BLUE CROSS/BLUE SHIELD | Admitting: Orthopaedic Surgery

## 2017-11-30 ENCOUNTER — Encounter (HOSPITAL_BASED_OUTPATIENT_CLINIC_OR_DEPARTMENT_OTHER): Payer: Self-pay | Admitting: *Deleted

## 2017-11-30 ENCOUNTER — Other Ambulatory Visit: Payer: Self-pay

## 2017-12-04 ENCOUNTER — Ambulatory Visit: Payer: BLUE CROSS/BLUE SHIELD

## 2017-12-07 ENCOUNTER — Encounter (HOSPITAL_BASED_OUTPATIENT_CLINIC_OR_DEPARTMENT_OTHER)
Admission: RE | Disposition: A | Discharge status: Home / Self Care | Payer: Self-pay | Source: Ambulatory Visit | Attending: Otolaryngology

## 2017-12-07 ENCOUNTER — Other Ambulatory Visit: Payer: Self-pay

## 2017-12-07 ENCOUNTER — Encounter (HOSPITAL_BASED_OUTPATIENT_CLINIC_OR_DEPARTMENT_OTHER): Payer: Self-pay | Admitting: *Deleted

## 2017-12-07 ENCOUNTER — Ambulatory Visit (HOSPITAL_BASED_OUTPATIENT_CLINIC_OR_DEPARTMENT_OTHER)
Admission: RE | Admit: 2017-12-07 | Discharge: 2017-12-07 | Disposition: A | Discharge status: Home / Self Care | Payer: BLUE CROSS/BLUE SHIELD | Source: Ambulatory Visit | Attending: Otolaryngology | Admitting: Otolaryngology

## 2017-12-07 ENCOUNTER — Ambulatory Visit (HOSPITAL_BASED_OUTPATIENT_CLINIC_OR_DEPARTMENT_OTHER): Payer: BLUE CROSS/BLUE SHIELD | Admitting: Certified Registered"

## 2017-12-07 DIAGNOSIS — F329 Major depressive disorder, single episode, unspecified: Secondary | ICD-10-CM | POA: Insufficient documentation

## 2017-12-07 DIAGNOSIS — Z6838 Body mass index (BMI) 38.0-38.9, adult: Secondary | ICD-10-CM | POA: Insufficient documentation

## 2017-12-07 DIAGNOSIS — J3489 Other specified disorders of nose and nasal sinuses: Secondary | ICD-10-CM | POA: Insufficient documentation

## 2017-12-07 DIAGNOSIS — J342 Deviated nasal septum: Secondary | ICD-10-CM | POA: Diagnosis not present

## 2017-12-07 DIAGNOSIS — E039 Hypothyroidism, unspecified: Secondary | ICD-10-CM | POA: Insufficient documentation

## 2017-12-07 DIAGNOSIS — G4730 Sleep apnea, unspecified: Secondary | ICD-10-CM | POA: Diagnosis not present

## 2017-12-07 DIAGNOSIS — R0683 Snoring: Secondary | ICD-10-CM | POA: Insufficient documentation

## 2017-12-07 DIAGNOSIS — J343 Hypertrophy of nasal turbinates: Secondary | ICD-10-CM | POA: Diagnosis not present

## 2017-12-07 DIAGNOSIS — F419 Anxiety disorder, unspecified: Secondary | ICD-10-CM | POA: Diagnosis not present

## 2017-12-07 DIAGNOSIS — Z79899 Other long term (current) drug therapy: Secondary | ICD-10-CM | POA: Insufficient documentation

## 2017-12-07 HISTORY — DX: Snoring: R06.83

## 2017-12-07 HISTORY — DX: Other intervertebral disc degeneration, lumbar region: M51.36

## 2017-12-07 HISTORY — PX: NASAL SEPTOPLASTY W/ TURBINOPLASTY: SHX2070

## 2017-12-07 HISTORY — DX: Hypothyroidism, unspecified: E03.9

## 2017-12-07 HISTORY — DX: Hypertrophy of nasal turbinates: J34.3

## 2017-12-07 HISTORY — DX: Deviated nasal septum: J34.2

## 2017-12-07 SURGERY — SEPTOPLASTY, NOSE, WITH NASAL TURBINATE REDUCTION
Anesthesia: General | Site: Nose

## 2017-12-07 MED ORDER — PROPOFOL 500 MG/50ML IV EMUL
INTRAVENOUS | Status: DC | PRN
  Administered 2017-12-07: 35 ug/kg/min via INTRAVENOUS

## 2017-12-07 MED ORDER — PROPOFOL 500 MG/50ML IV EMUL
INTRAVENOUS | Status: AC
  Filled 2017-12-07: qty 100

## 2017-12-07 MED ORDER — SCOPOLAMINE 1 MG/3DAYS TD PT72: 1.0000 | MEDICATED_PATCH | Freq: Once | TRANSDERMAL | Status: DC | PRN

## 2017-12-07 MED ORDER — LIDOCAINE-EPINEPHRINE 1 %-1:100000 IJ SOLN
INTRAMUSCULAR | Status: DC | PRN
  Administered 2017-12-07: 14 mL

## 2017-12-07 MED ORDER — FENTANYL CITRATE (PF) 100 MCG/2ML IJ SOLN
INTRAMUSCULAR | Status: AC
  Filled 2017-12-07: qty 2

## 2017-12-07 MED ORDER — OXYMETAZOLINE HCL 0.05 % NA SOLN
NASAL | Status: AC
  Filled 2017-12-07: qty 15

## 2017-12-07 MED ORDER — SUGAMMADEX SODIUM 200 MG/2ML IV SOLN
INTRAVENOUS | Status: DC | PRN
  Administered 2017-12-07: 390 mg via INTRAVENOUS

## 2017-12-07 MED ORDER — OXYMETAZOLINE HCL 0.05 % NA SOLN
NASAL | Status: DC | PRN
  Administered 2017-12-07: 1 via TOPICAL

## 2017-12-07 MED ORDER — HYDROCODONE-ACETAMINOPHEN 5-325 MG PO TABS: 1.0000 | ORAL_TABLET | ORAL | 0 refills | Status: DC | PRN

## 2017-12-07 MED ORDER — OXYCODONE HCL 5 MG PO TABS: 5.0000 mg | ORAL_TABLET | Freq: Once | ORAL | Status: DC | PRN

## 2017-12-07 MED ORDER — ONDANSETRON HCL 4 MG/2ML IJ SOLN
INTRAMUSCULAR | Status: DC | PRN
  Administered 2017-12-07: 4 mg via INTRAVENOUS

## 2017-12-07 MED ORDER — DEXAMETHASONE SODIUM PHOSPHATE 4 MG/ML IJ SOLN
INTRAMUSCULAR | Status: DC | PRN
  Administered 2017-12-07: 10 mg via INTRAVENOUS

## 2017-12-07 MED ORDER — PROPOFOL 10 MG/ML IV BOLUS
INTRAVENOUS | Status: DC | PRN
  Administered 2017-12-07: 100 mg via INTRAVENOUS

## 2017-12-07 MED ORDER — LIDOCAINE-EPINEPHRINE 1 %-1:100000 IJ SOLN
INTRAMUSCULAR | Status: AC
  Filled 2017-12-07: qty 1

## 2017-12-07 MED ORDER — FENTANYL CITRATE (PF) 100 MCG/2ML IJ SOLN
25.0000 ug | INTRAMUSCULAR | Status: DC | PRN
  Administered 2017-12-07: 50 ug via INTRAVENOUS

## 2017-12-07 MED ORDER — LACTATED RINGERS IV SOLN: INTRAVENOUS | Status: DC

## 2017-12-07 MED ORDER — OXYCODONE HCL 5 MG/5ML PO SOLN: 5.0000 mg | Freq: Once | ORAL | Status: DC | PRN

## 2017-12-07 MED ORDER — LIDOCAINE 2% (20 MG/ML) 5 ML SYRINGE
INTRAMUSCULAR | Status: AC
  Filled 2017-12-07: qty 20

## 2017-12-07 MED ORDER — SUGAMMADEX SODIUM 500 MG/5ML IV SOLN
INTRAVENOUS | Status: AC
  Filled 2017-12-07: qty 5

## 2017-12-07 MED ORDER — ROCURONIUM BROMIDE 100 MG/10ML IV SOLN
INTRAVENOUS | Status: DC | PRN
  Administered 2017-12-07: 40 mg via INTRAVENOUS

## 2017-12-07 MED ORDER — LIDOCAINE HCL (CARDIAC) 20 MG/ML IV SOLN
INTRAVENOUS | Status: DC | PRN
  Administered 2017-12-07: 30 mg via INTRAVENOUS

## 2017-12-07 MED ORDER — LIDOCAINE-EPINEPHRINE 0.5 %-1:200000 IJ SOLN
INTRAMUSCULAR | Status: AC
  Filled 2017-12-07: qty 1

## 2017-12-07 MED ORDER — FENTANYL CITRATE (PF) 100 MCG/2ML IJ SOLN
50.0000 ug | INTRAMUSCULAR | Status: DC | PRN
  Administered 2017-12-07 (×2): 50 ug via INTRAVENOUS

## 2017-12-07 MED ORDER — ONDANSETRON HCL 4 MG/2ML IJ SOLN
INTRAMUSCULAR | Status: AC
  Filled 2017-12-07: qty 8

## 2017-12-07 MED ORDER — MUPIROCIN 2 % EX OINT
TOPICAL_OINTMENT | CUTANEOUS | Status: AC
  Filled 2017-12-07: qty 22

## 2017-12-07 MED ORDER — MIDAZOLAM HCL 2 MG/2ML IJ SOLN: 1.0000 mg | INTRAMUSCULAR | Status: DC | PRN

## 2017-12-07 MED ORDER — EPHEDRINE 5 MG/ML INJ
INTRAVENOUS | Status: AC
  Filled 2017-12-07: qty 10

## 2017-12-07 MED ORDER — DEXAMETHASONE SODIUM PHOSPHATE 10 MG/ML IJ SOLN
INTRAMUSCULAR | Status: AC
  Filled 2017-12-07: qty 2

## 2017-12-07 MED ORDER — CEPHALEXIN 500 MG PO CAPS: 500.0000 mg | ORAL_CAPSULE | Freq: Three times a day (TID) | ORAL | 0 refills | Status: AC

## 2017-12-07 MED ORDER — PHENYLEPHRINE 40 MCG/ML (10ML) SYRINGE FOR IV PUSH (FOR BLOOD PRESSURE SUPPORT)
PREFILLED_SYRINGE | INTRAVENOUS | Status: AC
  Filled 2017-12-07: qty 10

## 2017-12-07 SURGICAL SUPPLY — 37 items
ATTRACTOMAT 16X20 MAGNETIC DRP (DRAPES) IMPLANT
BLADE INF TURB ROT M4 2 5PK (BLADE) ×1 IMPLANT
BLADE TRICUT ROTATE M4 4 5PK (BLADE) IMPLANT
CANISTER SUCT 1200ML W/VALVE (MISCELLANEOUS) ×2 IMPLANT
COAGULATOR SUCT SWTCH 10FR 6 (ELECTROSURGICAL) IMPLANT
DECANTER SPIKE VIAL GLASS SM (MISCELLANEOUS) IMPLANT
DRSG NASOPORE 8CM (GAUZE/BANDAGES/DRESSINGS) IMPLANT
DRSG TELFA 3X8 NADH (GAUZE/BANDAGES/DRESSINGS) IMPLANT
ELECT REM PT RETURN 9FT ADLT (ELECTROSURGICAL) ×2
ELECTRODE REM PT RTRN 9FT ADLT (ELECTROSURGICAL) ×1 IMPLANT
GLOVE BIO SURGEON STRL SZ7.5 (GLOVE) ×2 IMPLANT
GOWN STRL REUS W/ TWL LRG LVL3 (GOWN DISPOSABLE) ×2 IMPLANT
GOWN STRL REUS W/TWL LRG LVL3 (GOWN DISPOSABLE) ×4
HEMOSTAT SURGICEL .5X2 ABSORB (HEMOSTASIS) IMPLANT
IV NS 500ML (IV SOLUTION) ×2
IV NS 500ML BAXH (IV SOLUTION) ×1 IMPLANT
NDL PRECISIONGLIDE 27X1.5 (NEEDLE) ×1 IMPLANT
NEEDLE PRECISIONGLIDE 27X1.5 (NEEDLE) ×2 IMPLANT
NS IRRIG 1000ML POUR BTL (IV SOLUTION) ×1 IMPLANT
PACK BASIN DAY SURGERY FS (CUSTOM PROCEDURE TRAY) ×2 IMPLANT
PACK ENT DAY SURGERY (CUSTOM PROCEDURE TRAY) ×2 IMPLANT
PAD DRESSING TELFA 3X8 NADH (GAUZE/BANDAGES/DRESSINGS) IMPLANT
PATTIES SURGICAL .5 X3 (DISPOSABLE) ×2 IMPLANT
SHEET MEDIUM DRAPE 40X70 STRL (DRAPES) IMPLANT
SHEET SILASTIC 8X6X.030 25-30 (MISCELLANEOUS) IMPLANT
SLEEVE SCD COMPRESS KNEE MED (MISCELLANEOUS) ×2 IMPLANT
SPLINT NASAL AIRWAY SILICONE (MISCELLANEOUS) ×2 IMPLANT
SPONGE GAUZE 2X2 8PLY STRL LF (GAUZE/BANDAGES/DRESSINGS) ×2 IMPLANT
SUT CHROMIC 2 0 SH (SUTURE) ×2 IMPLANT
SUT CHROMIC 4 0 P 3 18 (SUTURE) ×2 IMPLANT
SUT ETHILON 3 0 PS 1 (SUTURE) IMPLANT
SUT PLAIN 4 0 ~~LOC~~ 1 (SUTURE) IMPLANT
SYR CONTROL 10ML LL (SYRINGE) IMPLANT
TOWEL OR 17X24 6PK STRL BLUE (TOWEL DISPOSABLE) ×2 IMPLANT
TRAY DSU PREP LF (CUSTOM PROCEDURE TRAY) ×2 IMPLANT
TUBE CONNECTING 20X1/4 (TUBING) ×2 IMPLANT
YANKAUER SUCT BULB TIP NO VENT (SUCTIONS) ×2 IMPLANT

## 2017-12-07 NOTE — Anesthesia Preprocedure Evaluation (Signed)
Anesthesia Evaluation  Patient identified by MRN, date of birth, ID band Patient awake    Reviewed: Allergy & Precautions, NPO status , Patient's Chart, lab work & pertinent test results  History of Anesthesia Complications (+) PONV and history of anesthetic complications  Airway Mallampati: II  TM Distance: >3 FB Neck ROM: Full    Dental  (+) Teeth Intact   Pulmonary neg shortness of breath, sleep apnea , neg COPD,    breath sounds clear to auscultation       Cardiovascular (-) hypertension(-) angina(-) Past MI and (-) CHF negative cardio ROS   Rhythm:Regular     Neuro/Psych neg Seizures PSYCHIATRIC DISORDERS Anxiety Depression    GI/Hepatic negative GI ROS, Neg liver ROS,   Endo/Other  Hypothyroidism Morbid obesity  Renal/GU      Musculoskeletal  (+) Arthritis ,   Abdominal   Peds  Hematology negative hematology ROS (+)   Anesthesia Other Findings   Reproductive/Obstetrics                             Anesthesia Physical Anesthesia Plan  ASA: II  Anesthesia Plan: General   Post-op Pain Management:    Induction: Intravenous  PONV Risk Score and Plan: 4 or greater and Ondansetron, Dexamethasone, Propofol infusion and Scopolamine patch - Pre-op  Airway Management Planned: Oral ETT  Additional Equipment: None  Intra-op Plan:   Post-operative Plan: Extubation in OR  Informed Consent: I have reviewed the patients History and Physical, chart, labs and discussed the procedure including the risks, benefits and alternatives for the proposed anesthesia with the patient or authorized representative who has indicated his/her understanding and acceptance.   Dental advisory given  Plan Discussed with: CRNA and Surgeon  Anesthesia Plan Comments:         Anesthesia Quick Evaluation

## 2017-12-07 NOTE — H&P (Signed)
Hannah Lynch is an 47 y.o. female.   Chief Complaint: Nasal obstruction, snoring HPI: 47 year old female with long history of snoring and nasal obstruction.  Sleep study negative for sleep apnea.  Presents for surgical management.  Past Medical History:  Diagnosis Date  . Anxiety   . Degenerative disc disease, lumbar   . Depression   . Family history of adverse reaction to anesthesia    pt's mother has hx. of post-op N/V  . Hypothyroidism   . Nasal septal deviation 11/2017  . Nasal turbinate hypertrophy 11/2017  . PONV (postoperative nausea and vomiting)   . Snoring 11/2017    Past Surgical History:  Procedure Laterality Date  . ABDOMINAL HYSTERECTOMY  2005   partial  . ANTERIOR CERVICAL DECOMP/DISCECTOMY FUSION  03/25/2007   C4-5  . CERVICAL FUSION  2013  . KNEE ARTHROSCOPY Right   . LUMBAR LAMINECTOMY/DECOMPRESSION MICRODISCECTOMY  11/21/2004   L4-5  . MAXIMUM ACCESS (MAS)POSTERIOR LUMBAR INTERBODY FUSION (PLIF) 1 LEVEL N/A 09/25/2014   Procedure: Lumbar four-five MAXIMUM ACCESS (MAS) POSTERIOR LUMBAR INTERBODY FUSION (PLIF) 1 LEVEL;  Surgeon: Maeola HarmanJoseph Stern, MD;  Location: [facility];  Service: Neurosurgery;  Laterality: N/A;  . ORIF ANKLE FRACTURE Right     Family History  Problem Relation Age of Onset  . Hypertension Mother   . Diabetes Mother   . Heart failure Father   . Hypertension Father   . Diabetes Father   . Heart failure Other    Social History:  reports that  has never smoked. she has never used smokeless tobacco. She reports that she does not drink alcohol or use drugs.  Allergies: No Known Allergies  Medications Prior to Admission  Medication Sig Dispense Refill  . citalopram (CELEXA) 20 MG tablet Take 20 mg by mouth daily.    . Cyanocobalamin (VITAMIN B 12 PO) Take by mouth.    . gabapentin (NEURONTIN) 300 MG capsule Take 300 mg by mouth 3 (three) times daily.    Marland Kitchen. levothyroxine (SYNTHROID, LEVOTHROID) 25 MCG tablet Take by mouth.    . terbinafine  (LAMISIL) 250 MG tablet Take 250 mg by mouth daily.      No results found for this or any previous visit (from the past 48 hour(s)). No results found.  Review of Systems  All other systems reviewed and are negative.   Blood pressure 112/69, pulse (!) 56, temperature 98.4 F (36.9 C), temperature source Oral, resp. rate 20, height 5\' 3"  (1.6 m), weight 215 lb (97.5 kg), SpO2 95 %. Physical Exam  Constitutional: She is oriented to person, place, and time. She appears well-developed and well-nourished. No distress.  HENT:  Head: Normocephalic and atraumatic.  Right Ear: External ear normal.  Left Ear: External ear normal.  Nose: Nose normal.  Mouth/Throat: Oropharynx is clear and moist.  Septum deviates to left, turbinates enlarged.  Eyes: Conjunctivae and EOM are normal. Pupils are equal, round, and reactive to light.  Neck: Normal range of motion. Neck supple.  Cardiovascular: Normal rate.  Respiratory: Effort normal.  Neurological: She is alert and oriented to person, place, and time. No cranial nerve deficit.  Skin: Skin is warm and dry.  Psychiatric: She has a normal mood and affect. Her behavior is normal. Judgment and thought content normal.     Assessment/Plan Septal deviation, turbinate hypertrophy, snoring To OR for septoplasty, turbinate reduction, and DISE.  Christia ReadingBATES, DWIGHT, MD 12/07/2017, 7:23 AM

## 2017-12-07 NOTE — Anesthesia Procedure Notes (Signed)
Procedure Name: Intubation Date/Time: 12/07/2017 7:58 AM Performed by: Signe Colt, CRNA Pre-anesthesia Checklist: Patient identified, Emergency Drugs available, Suction available and Patient being monitored Patient Re-evaluated:Patient Re-evaluated prior to induction Oxygen Delivery Method: Circle system utilized Preoxygenation: Pre-oxygenation with 100% oxygen Induction Type: IV induction Ventilation: Mask ventilation without difficulty Laryngoscope Size: Mac and 3 Grade View: Grade I Tube type: Oral Tube size: 7.0 mm Number of attempts: 1 Airway Equipment and Method: Stylet and Oral airway Placement Confirmation: ETT inserted through vocal cords under direct vision,  positive ETCO2 and breath sounds checked- equal and bilateral Secured at: 21 cm Tube secured with: Tape Dental Injury: Teeth and Oropharynx as per pre-operative assessment

## 2017-12-07 NOTE — Transfer of Care (Signed)
Immediate Anesthesia Transfer of Care Note  Patient: Corrie MckusickLisa O Benito  Procedure(s) Performed: NASAL SEPTOPLASTY WITH BILATERAL TURBINATE REDUCTION (N/A Nose) DRUG INDUCED ENDOSCOPY (N/A Nose)  Patient Location: PACU  Anesthesia Type:General  Level of Consciousness: awake, alert , oriented and patient cooperative  Airway & Oxygen Therapy: Patient Spontanous Breathing and Patient connected to face mask oxygen  Post-op Assessment: Report given to RN and Post -op Vital signs reviewed and stable  Post vital signs: Reviewed and stable  Last Vitals:  Vitals:   12/07/17 0626  BP: 112/69  Pulse: (!) 56  Resp: 20  Temp: 36.9 C  SpO2: 95%    Last Pain:  Vitals:   12/07/17 0626  TempSrc: Oral         Complications: No apparent anesthesia complications

## 2017-12-07 NOTE — Anesthesia Postprocedure Evaluation (Signed)
Anesthesia Post Note  Patient: Corrie MckusickLisa O Thalman  Procedure(s) Performed: NASAL SEPTOPLASTY WITH BILATERAL TURBINATE REDUCTION (N/A Nose) DRUG INDUCED ENDOSCOPY (N/A Nose)     Patient location during evaluation: PACU Anesthesia Type: General Level of consciousness: awake and alert Pain management: pain level controlled Vital Signs Assessment: post-procedure vital signs reviewed and stable Respiratory status: spontaneous breathing, nonlabored ventilation, respiratory function stable and patient connected to nasal cannula oxygen Cardiovascular status: blood pressure returned to baseline and stable Postop Assessment: no apparent nausea or vomiting Anesthetic complications: no    Last Vitals:  Vitals:   12/07/17 0930 12/07/17 1001  BP: 119/64 (!) 126/96  Pulse: 91 84  Resp: 18 16  Temp:  36.7 C  SpO2: 94% 95%    Last Pain:  Vitals:   12/07/17 1001  TempSrc:   PainSc: 0-No pain                 CHRISTOPHER MOSER

## 2017-12-07 NOTE — Brief Op Note (Signed)
12/07/2017  8:32 AM  PATIENT:  Hannah MckusickLisa O Sauber  47 y.o. female  PRE-OPERATIVE DIAGNOSIS:  SNORING, NASAL AND BILATERAL TRUBINATE HYPERTROPHY, NASAL SEPTUM DEVIATION  POST-OPERATIVE DIAGNOSIS:  SNORING, NASAL AND BILATERAL TRUBINATE HYPERTROPHY, NASAL SEPTUM DEVIATION  PROCEDURE:  Procedure(s): NASAL SEPTOPLASTY WITH BILATERAL TURBINATE REDUCTION (N/A) DRUG INDUCED ENDOSCOPY (N/A)  SURGEON:  Surgeon(s) and Role:    Christia Reading* Bates, Dwight, MD - Primary  PHYSICIAN ASSISTANT:   ASSISTANTS: none   ANESTHESIA:   general  EBL:  Minimal   BLOOD ADMINISTERED:none  DRAINS: none   LOCAL MEDICATIONS USED:  LIDOCAINE   SPECIMEN:  No Specimen  DISPOSITION OF SPECIMEN:  N/A  COUNTS:  YES  TOURNIQUET:  * No tourniquets in log *  DICTATION: .Other Dictation: Dictation Number [id number]  PLAN OF CARE: Discharge to home after PACU  PATIENT DISPOSITION:  PACU - hemodynamically stable.   Delay start of Pharmacological VTE agent (>24hrs) due to surgical blood loss or risk of bleeding: yes

## 2017-12-07 NOTE — Discharge Instructions (Signed)

## 2017-12-08 ENCOUNTER — Encounter (HOSPITAL_BASED_OUTPATIENT_CLINIC_OR_DEPARTMENT_OTHER): Payer: Self-pay | Admitting: Otolaryngology

## 2017-12-08 NOTE — Op Note (Signed)
NAME:  Hannah Lynch, Hannah Lynch                        ACCOUNT NO.:  MEDICAL RECORD NO.:  [id number]  LOCATION:                                 FACILITY:  PHYSICIAN:  Antony Contraswight D Bates, MD     DATE OF BIRTH:  23-Mar-1971  DATE OF PROCEDURE:  12/07/2017 DATE OF DISCHARGE:                              OPERATIVE REPORT   PREOPERATIVE DIAGNOSES: 1. Septal deviation. 2. Inferior turbinate hypertrophy. 3. Snoring.  POSTOPERATIVE DIAGNOSES: 1. Septal deviation. 2. Inferior turbinate hypertrophy. 3. Snoring.  PROCEDURES: 1. Nasal septoplasty. 2. Bilateral inferior turbinate reduction. 3. Drug-induced sleep endoscopy.  SURGEON:  Antony Contraswight D Bates, MD.  ANESTHESIA:  General endotracheal anesthesia.  COMPLICATIONS:  None.  INDICATION:  The patient is a 47 year old female with a long history of snoring and nasal obstruction.  A sleep study previously did not demonstrate significant sleep apnea.  Her nasal obstruction has not responded to medical therapy and she was found to have a leftward septal deviation and turbinate hypertrophy.  She presents to the operative suite for surgical management.  FINDINGS:  Upon drug-induced sleep endoscopy, fiberoptic exam of the nasopharynx and oropharynx demonstrated about 50% concentric narrowing of the postpalatal airway with certainly about 50% anterior-posterior pattern.  The tongue base was in a somewhat posterior position, but did not obstruct.  The septum was found to be deviated to the left with a large spur toward the left side inferiorly.  The turbinates were enlarged.  DESCRIPTION OF PROCEDURE:  The patient was identified in the holding room and informed consent having been obtained including discussion of risks, benefits, and alternatives, the patient was brought to the operative suite and put on the operating table in supine position.  Drug- induced sleep endoscopy was performed first, so the patient was given slowly increasing dosages of propofol  until she achieved a simulated sleep.  She was able to be awakened with a loud voice.  When she began to snore, the fiberoptic laryngoscope was passed through the right nasal passage to view the nasopharynx and oropharynx.  The findings were noted above.  After this evaluation was completed, the flexible scope was removed.  The patient was then deepened under general anesthesia and intubated by the Anesthesia team without difficulty.  The patient was given intravenous antibiotics during the case.  The eyes taped closed and the midface was prepped and draped in a sterile fashion.  Afrin pledgets were placed in both sides of the nose for several minutes and then removed.  The septum was injected on both sides using local anesthetic consisting of 1% lidocaine with 1:100,000 epinephrine.  A left-sided Killian incision was made with a 15 blade scalpel and the soft tissues were elevated off the septum in the subperichondrial plane back beyond the bony cartilaginous junction.  The junction was divided and the posterior bone was isolated from soft tissues and then removed in a piecemeal fashion.  The cartilaginous portion of the inferior spur was then removed using a Therapist, nutritionalreer elevator.  Soft tissues were elevated off the bony portion of the spur and an osteotome was used to remove the spur fully.  A small  tear was resulted in the posterior septal mucosa on the left side.  With removal of the posterior septal bone of the spur, the septum was found to be in good midline position.  The inferior turbinates were then injected on both sides using local anesthetic.  The left-sided inferior turbinate had 2 sections because of the previous spur consisting of an anterior section and posterior section.  These were approached independently on this side.  On the right, the turbinate was more uniform.  Stab incisions were made at the anterior head of both inferior turbinates and soft tissues were elevated off the  underlying bone using a Therapist, nutritional.  The submucosal tissues were then removed using the turbinate blade on the microdebrider, keeping the overlying mucosa intact.  An additional stab incision was made in the posterior section of the left inferior turbinate and the same procedure was performed on that section.  Soft tissue was redraped on both sides. Doyle splints were placed, coated in Bactroban ointment to both sides of the nose.  The Killian incision was closed with 4-0 chromic suture in a simple interrupted fashion.  The stents were brought into normal position and secured with a single 2-0 chromic in a standard mattress suture.  Drapes were removed and the patient was cleaned off.  The oropharynx was then suctioned and the patient returned to Anesthesia for wake-up and was extubated in the recovery room in stable condition.     Antony Contras, MD     DDB/MEDQ  D:  12/07/2017  T:  12/07/2017  Job:  [id number]  cc:   Antony Contras, MD's office

## 2017-12-21 ENCOUNTER — Ambulatory Visit: Payer: BLUE CROSS/BLUE SHIELD

## 2017-12-24 DIAGNOSIS — J3489 Other specified disorders of nose and nasal sinuses: Secondary | ICD-10-CM | POA: Insufficient documentation

## 2017-12-24 DIAGNOSIS — E039 Hypothyroidism, unspecified: Secondary | ICD-10-CM | POA: Insufficient documentation

## 2018-01-12 ENCOUNTER — Ambulatory Visit
Admission: RE | Admit: 2018-01-12 | Discharge: 2018-01-12 | Disposition: A | Discharge status: Home / Self Care | Payer: BLUE CROSS/BLUE SHIELD | Source: Ambulatory Visit | Attending: Family Medicine | Admitting: Family Medicine

## 2018-01-12 DIAGNOSIS — Z139 Encounter for screening, unspecified: Secondary | ICD-10-CM

## 2018-01-31 ENCOUNTER — Emergency Department (HOSPITAL_COMMUNITY): Payer: BLUE CROSS/BLUE SHIELD

## 2018-01-31 ENCOUNTER — Emergency Department (HOSPITAL_COMMUNITY)
Admission: EM | Admit: 2018-01-31 | Discharge: 2018-02-01 | Disposition: A | Discharge status: Home / Self Care | Payer: BLUE CROSS/BLUE SHIELD | Attending: Emergency Medicine | Admitting: Emergency Medicine

## 2018-01-31 ENCOUNTER — Other Ambulatory Visit: Payer: Self-pay

## 2018-01-31 ENCOUNTER — Encounter (HOSPITAL_COMMUNITY): Payer: Self-pay | Admitting: *Deleted

## 2018-01-31 DIAGNOSIS — R1031 Right lower quadrant pain: Secondary | ICD-10-CM | POA: Diagnosis present

## 2018-01-31 DIAGNOSIS — Z79899 Other long term (current) drug therapy: Secondary | ICD-10-CM | POA: Insufficient documentation

## 2018-01-31 DIAGNOSIS — E039 Hypothyroidism, unspecified: Secondary | ICD-10-CM | POA: Insufficient documentation

## 2018-01-31 LAB — COMPREHENSIVE METABOLIC PANEL
ALT: 20 U/L (ref 14–54)
AST: 23 U/L (ref 15–41)
Albumin: 3.9 g/dL (ref 3.5–5.0)
Alkaline Phosphatase: 67 U/L (ref 38–126)
Anion gap: 11 (ref 5–15)
BUN: 14 mg/dL (ref 6–20)
CO2: 25 mmol/L (ref 22–32)
Calcium: 9.1 mg/dL (ref 8.9–10.3)
Chloride: 101 mmol/L (ref 101–111)
Creatinine, Ser: 0.83 mg/dL (ref 0.44–1.00)
GFR calc Af Amer: >60 mL/min (ref >60)
GFR calc non Af Amer: >60 mL/min (ref >60)
Glucose, Bld: 101 mg/dL — ABNORMAL HIGH (ref 65–99)
Potassium: 3.7 mmol/L (ref 3.5–5.1)
Sodium: 137 mmol/L (ref 135–145)
Total Bilirubin: 0.6 mg/dL (ref 0.3–1.2)
Total Protein: 7.2 g/dL (ref 6.5–8.1)

## 2018-01-31 LAB — CBC
HCT: 36.7 % (ref 36.0–46.0)
Hemoglobin: 12.3 g/dL (ref 12.0–15.0)
MCH: 32.6 pg (ref 26.0–34.0)
MCHC: 33.5 g/dL (ref 30.0–36.0)
MCV: 97.3 fL (ref 78.0–100.0)
Platelets: 256 10*3/uL (ref 150–400)
RBC: 3.77 MIL/uL — ABNORMAL LOW (ref 3.87–5.11)
RDW: 13.5 % (ref 11.5–15.5)
WBC: 6.5 10*3/uL (ref 4.0–10.5)

## 2018-01-31 LAB — URINALYSIS, ROUTINE W REFLEX MICROSCOPIC
Bilirubin Urine: NEGATIVE
Glucose, UA: NEGATIVE mg/dL
Hgb urine dipstick: NEGATIVE
Ketones, ur: NEGATIVE mg/dL
Leukocytes, UA: NEGATIVE
Nitrite: NEGATIVE
Protein, ur: NEGATIVE mg/dL
Specific Gravity, Urine: 1.013 (ref 1.005–1.030)
pH: 6 (ref 5.0–8.0)

## 2018-01-31 LAB — PREGNANCY, URINE: Preg Test, Ur: NEGATIVE

## 2018-01-31 LAB — LIPASE, BLOOD: Lipase: 31 U/L (ref 11–51)

## 2018-01-31 MED ORDER — IOPAMIDOL (ISOVUE-300) INJECTION 61%
100.0000 mL | Freq: Once | INTRAVENOUS | Status: AC | PRN
  Administered 2018-02-01: 100 mL via INTRAVENOUS

## 2018-01-31 MED ORDER — MORPHINE SULFATE (PF) 4 MG/ML IV SOLN
4.0000 mg | Freq: Once | INTRAVENOUS | Status: AC
  Administered 2018-01-31: 4 mg via INTRAVENOUS
  Filled 2018-01-31: qty 1

## 2018-01-31 MED ORDER — SODIUM CHLORIDE 0.9 % IV BOLUS (SEPSIS)
1000.0000 mL | Freq: Once | INTRAVENOUS | Status: AC
  Administered 2018-01-31: 1000 mL via INTRAVENOUS

## 2018-01-31 MED ORDER — ONDANSETRON HCL 4 MG/2ML IJ SOLN
4.0000 mg | Freq: Once | INTRAMUSCULAR | Status: AC
  Administered 2018-01-31: 4 mg via INTRAVENOUS
  Filled 2018-01-31: qty 2

## 2018-01-31 NOTE — ED Notes (Signed)
Patient transported to CT 

## 2018-01-31 NOTE — ED Triage Notes (Signed)
Pt reports rlq pain with n/v/d x 2 days. Pain has gotten worse in the last several hours.

## 2018-01-31 NOTE — ED Provider Notes (Signed)
[facility] EMERGENCY DEPARTMENT Provider Note   CSN: [id number] Arrival date & time: 01/31/18  2241     History   Chief Complaint Chief Complaint  Patient presents with  . Abdominal Pain    HPI Hannah Lynch is a 47 y.o. female.  Patient presents with worsening right-sided lower abdominal pain for the past 2 days.  States the pain was coming and going but has been constant over the past several hours and becoming more intense.  It is worse with movement and better with lying still.  She said nausea and decreased appetite but no vomiting.  No diarrhea.  No pain with urination or blood in the urine.  No vaginal bleeding.  Patient with previous hysterectomy was still has ovaries.  Still has appendix.  Denies any radiation of the pain.  She has not had much of an appetite  The history is provided by the patient.  Abdominal Pain   Associated symptoms include nausea. Pertinent negatives include fever, vomiting, dysuria, frequency, headaches, arthralgias and myalgias.    Past Medical History:  Diagnosis Date  . Anxiety   . Degenerative disc disease, lumbar   . Depression   . Family history of adverse reaction to anesthesia    pt's mother has hx. of post-op N/V  . Hypothyroidism   . Nasal septal deviation 11/2017  . Nasal turbinate hypertrophy 11/2017  . PONV (postoperative nausea and vomiting)   . Snoring 11/2017    Patient Active Problem List   Diagnosis Date Noted  . Primary insomnia 06/25/2017  . Papanicolaou smear for cervical cancer screening 06/25/2017  . Bilateral hearing loss 06/25/2017  . Acute serous otitis media 06/25/2017  . Lumbar stenosis with neurogenic claudication 09/25/2014    Past Surgical History:  Procedure Laterality Date  . ABDOMINAL HYSTERECTOMY  2005   partial  . ANTERIOR CERVICAL DECOMP/DISCECTOMY FUSION  03/25/2007   C4-5  . CERVICAL FUSION  2013  . KNEE ARTHROSCOPY Right   . LUMBAR LAMINECTOMY/DECOMPRESSION MICRODISCECTOMY  11/21/2004   L4-5  . MAXIMUM ACCESS (MAS)POSTERIOR LUMBAR INTERBODY FUSION (PLIF) 1 LEVEL N/A 09/25/2014   Procedure: Lumbar four-five MAXIMUM ACCESS (MAS) POSTERIOR LUMBAR INTERBODY FUSION (PLIF) 1 LEVEL;  Surgeon: Maeola Harman, MD;  Location: [facility];  Service: Neurosurgery;  Laterality: N/A;  . NASAL SEPTOPLASTY W/ TURBINOPLASTY N/A 12/07/2017   Procedure: NASAL SEPTOPLASTY WITH BILATERAL TURBINATE REDUCTION;  Surgeon: Christia Reading, MD;  Location: [facility];  Service: ENT;  Laterality: N/A;  . ORIF ANKLE FRACTURE Right      OB History   None      Home Medications    Prior to Admission medications   Medication Sig Start Date End Date Taking? Authorizing Provider  citalopram (CELEXA) 20 MG tablet Take 20 mg by mouth daily.    [provider]  Cyanocobalamin (VITAMIN B 12 PO) Take by mouth.    [provider]  gabapentin (NEURONTIN) 300 MG capsule Take 300 mg by mouth 3 (three) times daily.    [provider]  HYDROcodone-acetaminophen (NORCO/VICODIN) 5-325 MG tablet Take 1-2 tablets by mouth every 4 (four) hours as needed for moderate pain. 12/07/17 12/07/18  Christia Reading, MD  levothyroxine (SYNTHROID, LEVOTHROID) 25 MCG tablet Take by mouth. 01/23/17 01/23/18  [provider]  terbinafine (LAMISIL) 250 MG tablet Take 250 mg by mouth daily.    [provider]    Family History Family History  Problem Relation Age of Onset  . Hypertension Mother   .  Diabetes Mother   . Heart failure Father   . Hypertension Father   . Diabetes Father   . Heart failure Other     Social History Social History   Tobacco Use  . Smoking status: Never Smoker  . Smokeless tobacco: Never Used  Substance Use Topics  . Alcohol use: No    Frequency: Never  . Drug use: No     Allergies   Patient has no known allergies.   Review of Systems Review of Systems  Constitutional: Positive for activity change and appetite change. Negative for fever.   HENT: Negative for congestion and rhinorrhea.   Respiratory: Negative for cough, chest tightness and shortness of breath.   Cardiovascular: Negative for chest pain.  Gastrointestinal: Positive for abdominal pain and nausea. Negative for vomiting.  Genitourinary: Negative for dysuria, flank pain, frequency, vaginal bleeding and vaginal discharge.  Musculoskeletal: Negative for arthralgias and myalgias.  Skin: Negative for rash.  Neurological: Negative for dizziness, weakness and headaches.   all other systems are negative except as noted in the HPI and PMH.     Physical Exam Updated Vital Signs BP 117/80 (BP Location: Right Arm)   Pulse 94   Temp 98.3 F (36.8 C) (Oral)   Resp 18   Ht 5\' 3"  (1.6 m)   Wt 93 kg (205 lb)   SpO2 100%   BMI 36.31 kg/m   Physical Exam  Constitutional: She is oriented to person, place, and time. She appears well-developed and well-nourished. No distress.  HENT:  Head: Normocephalic and atraumatic.  Mouth/Throat: Oropharynx is clear and moist. No oropharyngeal exudate.  Eyes: Pupils are equal, round, and reactive to light. Conjunctivae and EOM are normal.  Neck: Normal range of motion. Neck supple.  No meningismus.  Cardiovascular: Normal rate, regular rhythm, normal heart sounds and intact distal pulses.  No murmur heard. Pulmonary/Chest: Effort normal and breath sounds normal. No respiratory distress.  Abdominal: Soft. There is tenderness. There is guarding. There is no rebound.  Abdomen is soft, mild right lower quadrant tenderness with voluntary guarding.  No rebound  Genitourinary:  Genitourinary Comments: Chaperone present.  Normal external genitalia.  No vaginal discharge.  No CMT.  There is midline uterine and right adnexal tenderness.  No left adnexal tenderness  Musculoskeletal: Normal range of motion. She exhibits no edema or tenderness.  Neurological: She is alert and oriented to person, place, and time. No cranial nerve deficit. She  exhibits normal muscle tone. Coordination normal.   5/5 strength throughout. CN 2-12 intact.Equal grip strength.   Skin: Skin is warm.  Psychiatric: She has a normal mood and affect. Her behavior is normal.  Nursing note and vitals reviewed.    ED Treatments / Results  Labs (all labs ordered are listed, but only abnormal results are displayed) Labs Reviewed  WET PREP, GENITAL - Abnormal; Notable for the following components:      Result Value   WBC, Wet Prep HPF POC RARE (*)    All other components within normal limits  COMPREHENSIVE METABOLIC PANEL - Abnormal; Notable for the following components:   Glucose, Bld 101 (*)    All other components within normal limits  CBC - Abnormal; Notable for the following components:   RBC 3.77 (*)    All other components within normal limits  URINALYSIS, ROUTINE W REFLEX MICROSCOPIC - Abnormal; Notable for the following components:   APPearance HAZY (*)    All other components within normal limits  LIPASE, BLOOD  PREGNANCY,  URINE  GC/CHLAMYDIA PROBE AMP () NOT AT [facility]    EKG None  Radiology Ct Abdomen Pelvis W Contrast  Result Date: 02/01/2018 CLINICAL DATA:  47 year old female with abdominal pain. Concern for acute appendicitis. EXAM: CT ABDOMEN AND PELVIS WITH CONTRAST TECHNIQUE: Multidetector CT imaging of the abdomen and pelvis was performed using the standard protocol following bolus administration of intravenous contrast. CONTRAST:  ISOVUE-300 IOPAMIDOL (ISOVUE-300) INJECTION 61% COMPARISON:  Abdominal CT dated 03/26/2015 FINDINGS: Lower chest: Minimal bibasilar atelectatic changes. The visualized lung bases are otherwise clear. No intra-abdominal free air or free fluid. Hepatobiliary: The liver is unremarkable. No intrahepatic biliary ductal dilatation. The gallbladder is unremarkable. Pancreas: Unremarkable. No pancreatic ductal dilatation or surrounding inflammatory changes. Spleen: Normal in size without focal  abnormality. Adrenals/Urinary Tract: Adrenal glands are unremarkable. Kidneys are normal, without renal calculi, focal lesion, or hydronephrosis. Bladder is unremarkable. Stomach/Bowel: Stomach is within normal limits. Appendix appears normal. No evidence of bowel wall thickening, distention, or inflammatory changes. Vascular/Lymphatic: No significant vascular findings are present. No enlarged abdominal or pelvic lymph nodes. Reproductive: Hysterectomy.  No pelvic mass. Other: Small fat containing umbilical hernia. Musculoskeletal: Degenerative changes of the spine. L4-L5 disc spacer and posterior fusion hardware. No acute osseous pathology. IMPRESSION: No acute intra-abdominal or pelvic pathology. No bowel obstruction or active inflammation. Normal appendix. Electronically Signed   By: Elgie Collard M.D.   On: 02/01/2018 00:31    Procedures Procedures (including critical care time)  Medications Ordered in ED Medications  sodium chloride 0.9 % bolus 1,000 mL (1,000 mLs Intravenous New Bag/Given 01/31/18 2325)  iopamidol (ISOVUE-300) 61 % injection 100 mL (has no administration in time range)  morphine 4 MG/ML injection 4 mg (4 mg Intravenous Given 01/31/18 2325)  ondansetron (ZOFRAN) injection 4 mg (4 mg Intravenous Given 01/31/18 2325)     Initial Impression / Assessment and Plan / ED Course  I have reviewed the triage vital signs and the nursing notes.  Pertinent labs & imaging results that were available during my care of the patient were reviewed by me and considered in my medical decision making (see chart for details).    Right lower quadrant pain with nausea and anorexia.  No fever or vomiting.  Rule out appendicitis.  Also consider ovarian pathology.  Urinalysis is negative.  Appendix is normal on CT scan.  There is no evident ovarian pathology.  However patient does have right-sided adnexal and midline tenderness on pelvic exam. Doubt ovarian torsion as pain is been going on for  several days and patient appears very comfortable.  We will arrange for outpatient ovarian ultrasound which is not available tonight.  Follow-up with PCP.  Return precautions discussed.  Final Clinical Impressions(s) / ED Diagnoses   Final diagnoses:  Abdominal pain, RLQ    ED Discharge Orders    None       Rancour, Jeannett Senior, MD 02/01/18 [phone]

## 2018-02-01 LAB — WET PREP, GENITAL
Sperm: NONE SEEN
Trich, Wet Prep: NONE SEEN
Yeast Wet Prep HPF POC: NONE SEEN

## 2018-02-01 MED ORDER — ONDANSETRON 4 MG PO TBDP: 4.0000 mg | ORAL_TABLET | Freq: Three times a day (TID) | ORAL | 0 refills | Status: DC | PRN

## 2018-02-01 MED ORDER — HYDROCODONE-ACETAMINOPHEN 5-325 MG PO TABS
1.0000 | ORAL_TABLET | Freq: Once | ORAL | Status: AC
  Administered 2018-02-01: 1 via ORAL
  Filled 2018-02-01: qty 1

## 2018-02-01 MED ORDER — NAPROXEN 500 MG PO TABS: 500.0000 mg | ORAL_TABLET | Freq: Two times a day (BID) | ORAL | 0 refills | Status: DC

## 2018-02-01 NOTE — ED Notes (Signed)
Returned from ct 

## 2018-02-01 NOTE — ED Notes (Signed)
Dr. Manus Gunningancour and Karoline CaldwellAngie, NT in doing a pelvic exam on pt.

## 2018-02-01 NOTE — Discharge Instructions (Addendum)
Your appendix appears normal.  Schedule an ultrasound of your ovaries within the next day.  Follow-up with your doctor.  Return to the ED if you develop new or worsening symptoms.

## 2018-02-02 LAB — GC/CHLAMYDIA PROBE AMP (~~LOC~~) NOT AT ARMC
Chlamydia: NEGATIVE
Neisseria Gonorrhea: NEGATIVE

## 2018-02-03 ENCOUNTER — Ambulatory Visit (HOSPITAL_COMMUNITY)
Admission: RE | Admit: 2018-02-03 | Discharge: 2018-02-03 | Disposition: A | Discharge status: Home / Self Care | Payer: BLUE CROSS/BLUE SHIELD | Source: Ambulatory Visit | Attending: Emergency Medicine | Admitting: Emergency Medicine

## 2018-02-03 DIAGNOSIS — Z90710 Acquired absence of both cervix and uterus: Secondary | ICD-10-CM | POA: Diagnosis not present

## 2018-02-03 DIAGNOSIS — R102 Pelvic and perineal pain: Secondary | ICD-10-CM | POA: Diagnosis present

## 2018-02-03 NOTE — ED Provider Notes (Signed)
I briefly interviewed patient and gave her her ultrasound report showing no acute abnormality.  She states she is feeling somewhat better today.  She saw her primary care physician yesterday.  She was advised by her primary care physician to communicate with office tomorrow to let her know how she is doing.  If she is not doing better she can be seen again at her primary care physician's office.  I also reminded her that if she is feeling worse or wishes to be reevaluated that she can check into the emergency department.     Doug SouJacubowitz, Sam, MD 02/03/18 1229

## 2018-02-19 ENCOUNTER — Encounter (HOSPITAL_COMMUNITY): Payer: Self-pay | Admitting: *Deleted

## 2018-02-19 ENCOUNTER — Other Ambulatory Visit: Payer: Self-pay

## 2018-02-19 ENCOUNTER — Emergency Department (HOSPITAL_COMMUNITY)
Admission: EM | Admit: 2018-02-19 | Discharge: 2018-02-20 | Disposition: A | Discharge status: Home / Self Care | Payer: BLUE CROSS/BLUE SHIELD | Attending: Emergency Medicine | Admitting: Emergency Medicine

## 2018-02-19 DIAGNOSIS — Z79899 Other long term (current) drug therapy: Secondary | ICD-10-CM | POA: Diagnosis not present

## 2018-02-19 DIAGNOSIS — R109 Unspecified abdominal pain: Secondary | ICD-10-CM | POA: Diagnosis present

## 2018-02-19 DIAGNOSIS — R1011 Right upper quadrant pain: Secondary | ICD-10-CM | POA: Insufficient documentation

## 2018-02-19 DIAGNOSIS — E039 Hypothyroidism, unspecified: Secondary | ICD-10-CM | POA: Diagnosis not present

## 2018-02-19 DIAGNOSIS — R197 Diarrhea, unspecified: Secondary | ICD-10-CM | POA: Insufficient documentation

## 2018-02-19 LAB — CBC
HCT: 37.3 % (ref 36.0–46.0)
Hemoglobin: 12.4 g/dL (ref 12.0–15.0)
MCH: 32.7 pg (ref 26.0–34.0)
MCHC: 33.2 g/dL (ref 30.0–36.0)
MCV: 98.4 fL (ref 78.0–100.0)
Platelets: 239 10*3/uL (ref 150–400)
RBC: 3.79 MIL/uL — ABNORMAL LOW (ref 3.87–5.11)
RDW: 13.4 % (ref 11.5–15.5)
WBC: 8.5 10*3/uL (ref 4.0–10.5)

## 2018-02-19 LAB — URINALYSIS, ROUTINE W REFLEX MICROSCOPIC
Bilirubin Urine: NEGATIVE
Glucose, UA: NEGATIVE mg/dL
Hgb urine dipstick: NEGATIVE
Ketones, ur: NEGATIVE mg/dL
Leukocytes, UA: NEGATIVE
Nitrite: NEGATIVE
Protein, ur: NEGATIVE mg/dL
Specific Gravity, Urine: 1.014 (ref 1.005–1.030)
pH: 7 (ref 5.0–8.0)

## 2018-02-19 LAB — COMPREHENSIVE METABOLIC PANEL
ALT: 21 U/L (ref 14–54)
AST: 25 U/L (ref 15–41)
Albumin: 3.8 g/dL (ref 3.5–5.0)
Alkaline Phosphatase: 65 U/L (ref 38–126)
Anion gap: 9 (ref 5–15)
BUN: 11 mg/dL (ref 6–20)
CO2: 28 mmol/L (ref 22–32)
Calcium: 9.2 mg/dL (ref 8.9–10.3)
Chloride: 105 mmol/L (ref 101–111)
Creatinine, Ser: 0.79 mg/dL (ref 0.44–1.00)
GFR calc Af Amer: >60 mL/min (ref >60)
GFR calc non Af Amer: >60 mL/min (ref >60)
Glucose, Bld: 84 mg/dL (ref 65–99)
Potassium: 3.9 mmol/L (ref 3.5–5.1)
Sodium: 142 mmol/L (ref 135–145)
Total Bilirubin: 0.2 mg/dL — ABNORMAL LOW (ref 0.3–1.2)
Total Protein: 7.1 g/dL (ref 6.5–8.1)

## 2018-02-19 LAB — LIPASE, BLOOD: Lipase: 31 U/L (ref 11–51)

## 2018-02-19 MED ORDER — SODIUM CHLORIDE 0.9 % IV BOLUS
1000.0000 mL | Freq: Once | INTRAVENOUS | Status: AC
  Administered 2018-02-19: 1000 mL via INTRAVENOUS

## 2018-02-19 MED ORDER — FENTANYL CITRATE (PF) 100 MCG/2ML IJ SOLN
50.0000 ug | Freq: Once | INTRAMUSCULAR | Status: AC
  Administered 2018-02-19: 50 ug via INTRAVENOUS
  Filled 2018-02-19: qty 2

## 2018-02-19 NOTE — ED Provider Notes (Signed)
[facility] EMERGENCY DEPARTMENT Provider Note   CSN: [id number] Arrival date & time: 02/19/18  2204  Time seen 23:10 PM    History   Chief Complaint Chief Complaint  Patient presents with  . Abdominal Pain    HPI Hannah Lynch is a 47 y.o. female.  HPI  Patient states she has been having abdominal discomfort since the end of March.  She states it is there constantly and just waxes and wanes.  She states it hurts more with movement and laying on her right side makes it feel better.  She states within 30 minutes of eating she feels like "a baby moving inside" and then she knows she will have to have diarrhea.  She however does not relate her discomfort to eating any specific food.  She reports she has 5-6 loose sometimes watery stools a day.  Today she has had 5.  The last was about 6 PM.  She did have nausea and vomiting but that is gone now.  She denies fever.  She states sometimes she feels dizzy or lightheaded but not currently.  She denies any shortness of breath or chest pain.  She denies anything different prior to this starting.  She denies being on antibiotics this year.  She states her mother is also having diarrhea, they do not live together, and her doctor advised her to avoid dairy products.  She states her family doctor thinks she may have IBS and started her on Bentyl which she takes 4 times a day including today without relief.  Her next appointment with her primary care doctor is April 15.  She states for the past 4-5 days the pain has changed from the right lower quadrant and now is in the right upper quadrant right underneath her rib cage.  She was seen for the same pain on March 24 in the ED and had a CT of the abdomen/pelvis and pelvic ultrasound done without etiology of her pain found.  PCP Joette CatchingNyland, Leonard, MD   Past Medical History:  Diagnosis Date  . Anxiety   . Degenerative disc disease, lumbar   . Depression   . Family history of adverse reaction to anesthesia    pt's mother has hx. of post-op N/V  . Hypothyroidism   . Nasal septal deviation 11/2017  . Nasal turbinate hypertrophy 11/2017  . PONV (postoperative nausea and vomiting)   . Snoring 11/2017    Patient Active Problem List   Diagnosis Date Noted  . Primary insomnia 06/25/2017  . Papanicolaou smear for cervical cancer screening 06/25/2017  . Bilateral hearing loss 06/25/2017  . Acute serous otitis media 06/25/2017  . Lumbar stenosis with neurogenic claudication 09/25/2014    Past Surgical History:  Procedure Laterality Date  . ABDOMINAL HYSTERECTOMY  2005   partial  . ANTERIOR CERVICAL DECOMP/DISCECTOMY FUSION  03/25/2007   C4-5  . CERVICAL FUSION  2013  . KNEE ARTHROSCOPY Right   . LUMBAR LAMINECTOMY/DECOMPRESSION MICRODISCECTOMY  11/21/2004   L4-5  . MAXIMUM ACCESS (MAS)POSTERIOR LUMBAR INTERBODY FUSION (PLIF) 1 LEVEL N/A 09/25/2014   Procedure: Lumbar four-five MAXIMUM ACCESS (MAS) POSTERIOR LUMBAR INTERBODY FUSION (PLIF) 1 LEVEL;  Surgeon: Maeola HarmanJoseph Stern, MD;  Location: [facility];  Service: Neurosurgery;  Laterality: N/A;  . NASAL SEPTOPLASTY W/ TURBINOPLASTY N/A 12/07/2017   Procedure: NASAL SEPTOPLASTY WITH BILATERAL TURBINATE REDUCTION;  Surgeon: Christia ReadingBates, Dwight, MD;  Location: [facility];  Service: ENT;  Laterality: N/A;  . ORIF ANKLE FRACTURE Right  OB History   None      Home Medications    Prior to Admission medications   Medication Sig Start Date End Date Taking? Authorizing Provider  citalopram (CELEXA) 20 MG tablet Take 20 mg by mouth daily.    [provider]  Cyanocobalamin (VITAMIN B 12 PO) Take by mouth.    [provider]  gabapentin (NEURONTIN) 300 MG capsule Take 300 mg by mouth 3 (three) times daily.    [provider]  HYDROcodone-acetaminophen (NORCO/VICODIN) 5-325 MG tablet Take 1-2 tablets by mouth every 4 (four) hours as needed for moderate pain. 12/07/17 12/07/18  Christia Reading, MD  levothyroxine  (SYNTHROID, LEVOTHROID) 25 MCG tablet Take by mouth. 01/23/17 01/23/18  [provider]  naproxen (NAPROSYN) 500 MG tablet Take 1 tablet (500 mg total) by mouth 2 (two) times daily. 02/01/18   Rancour, Jeannett Senior, MD  ondansetron (ZOFRAN ODT) 4 MG disintegrating tablet Take 1 tablet (4 mg total) by mouth every 8 (eight) hours as needed for nausea or vomiting. 02/01/18   Rancour, Jeannett Senior, MD  terbinafine (LAMISIL) 250 MG tablet Take 250 mg by mouth daily.    [provider]    Family History Family History  Problem Relation Age of Onset  . Hypertension Mother   . Diabetes Mother   . Heart failure Father   . Hypertension Father   . Diabetes Father   . Heart failure Other     Social History Social History   Tobacco Use  . Smoking status: Never Smoker  . Smokeless tobacco: Never Used  Substance Use Topics  . Alcohol use: No    Frequency: Never  . Drug use: No  employed in [employer], states the food arrives precooked, she does not handle raw food.    Allergies   Patient has no known allergies.   Review of Systems Review of Systems  All other systems reviewed and are negative.    Physical Exam Updated Vital Signs BP 139/88 (BP Location: Right Arm)   Pulse 87   Temp 98.6 F (37 C) (Oral)   Resp 16   Ht 5\' 3"  (1.6 m)   Wt 92.5 kg (204 lb)   SpO2 99%   BMI 36.14 kg/m   Vital signs normal    Physical Exam  Constitutional: She is oriented to person, place, and time. She appears well-developed and well-nourished.  Non-toxic appearance. She does not appear ill. No distress.  HENT:  Head: Normocephalic and atraumatic.  Right Ear: External ear normal.  Left Ear: External ear normal.  Nose: Nose normal. No mucosal edema or rhinorrhea.  Mouth/Throat: Oropharynx is clear and moist and mucous membranes are normal. No dental abscesses or uvula swelling.  Eyes: Pupils are equal, round, and reactive to light. Conjunctivae and EOM are normal.  Neck: Normal  range of motion and full passive range of motion without pain. Neck supple.  Cardiovascular: Normal rate, regular rhythm and normal heart sounds. Exam reveals no gallop and no friction rub.  No murmur heard. Pulmonary/Chest: Effort normal and breath sounds normal. No respiratory distress. She has no wheezes. She has no rhonchi. She has no rales. She exhibits no tenderness and no crepitus.  Abdominal: Soft. Normal appearance and bowel sounds are normal. She exhibits no distension. There is tenderness. There is no rebound and no guarding.    Area patient states is painful is noted, tender diffusely in the RUQ  Musculoskeletal: Normal range of motion. She exhibits no edema or tenderness.  Moves all extremities well.   Neurological: She is alert and oriented to person, place, and time. She has normal strength. No cranial nerve deficit.  Skin: Skin is warm, dry and intact. No rash noted. No erythema. No pallor.  Psychiatric: She has a normal mood and affect. Her speech is normal and behavior is normal. Her mood appears not anxious.  Nursing note and vitals reviewed.    ED Treatments / Results  Labs (all labs ordered are listed, but only abnormal results are displayed) Results for orders placed or performed during the hospital encounter of 02/19/18  Lipase, blood  Result Value Ref Range   Lipase 31 11 - 51 U/L  Comprehensive metabolic panel  Result Value Ref Range   Sodium 142 135 - 145 mmol/L   Potassium 3.9 3.5 - 5.1 mmol/L   Chloride 105 101 - 111 mmol/L   CO2 28 22 - 32 mmol/L   Glucose, Bld 84 65 - 99 mg/dL   BUN 11 6 - 20 mg/dL   Creatinine, Ser 1.61 0.44 - 1.00 mg/dL   Calcium 9.2 8.9 - 09.6 mg/dL   Total Protein 7.1 6.5 - 8.1 g/dL   Albumin 3.8 3.5 - 5.0 g/dL   AST 25 15 - 41 U/L   ALT 21 14 - 54 U/L   Alkaline Phosphatase 65 38 - 126 U/L   Total Bilirubin 0.2 (L) 0.3 - 1.2 mg/dL   GFR calc non Af Amer >60 >60 mL/min   GFR calc Af Amer >60 >60 mL/min   Anion gap 9 5 - 15    CBC  Result Value Ref Range   WBC 8.5 4.0 - 10.5 K/uL   RBC 3.79 (L) 3.87 - 5.11 MIL/uL   Hemoglobin 12.4 12.0 - 15.0 g/dL   HCT 04.5 40.9 - 81.1 %   MCV 98.4 78.0 - 100.0 fL   MCH 32.7 26.0 - 34.0 pg   MCHC 33.2 30.0 - 36.0 g/dL   RDW 91.4 78.2 - 95.6 %   Platelets 239 150 - 400 K/uL  Urinalysis, Routine w reflex microscopic  Result Value Ref Range   Color, Urine YELLOW YELLOW   APPearance CLOUDY (A) CLEAR   Specific Gravity, Urine 1.014 1.005 - 1.030   pH 7.0 5.0 - 8.0   Glucose, UA NEGATIVE NEGATIVE mg/dL   Hgb urine dipstick NEGATIVE NEGATIVE   Bilirubin Urine NEGATIVE NEGATIVE   Ketones, ur NEGATIVE NEGATIVE mg/dL   Protein, ur NEGATIVE NEGATIVE mg/dL   Nitrite NEGATIVE NEGATIVE   Leukocytes, UA NEGATIVE NEGATIVE   Laboratory interpretation all normal     EKG None  Radiology No results found.   Ct Abdomen Pelvis W Contrast  Result Date: 02/01/2018 CLINICAL DATA:  47 year old female with abdominal pain. Concern for acute appendicitis.  IMPRESSION: No acute intra-abdominal or pelvic pathology. No bowel obstruction or active inflammation. Normal appendix. Electronically Signed   By: Elgie Collard M.D.   On: 02/01/2018 00:31   US Pelvic Doppler (torsion R/o Or Mass Arterial Flow) US Pelvic Complete With Transvaginal  Result Date: 02/03/2018 CLINICAL DATA:  RIGHT pelvic pain  IMPRESSION: Post hysterectomy. No definite ovarian abnormalities. Electronically Signed   By: Ulyses Southward M.D.   On: 02/03/2018 12:16    Procedures Procedures (including critical care time)  Medications Ordered in ED Medications  sodium chloride 0.9 % bolus 1,000 mL (0 mLs Intravenous Stopped 02/20/18 0125)  sodium chloride 0.9 % bolus 1,000 mL (0 mLs Intravenous Stopped 02/20/18 0248)  fentaNYL (SUBLIMAZE)  injection 50 mcg (50 mcg Intravenous Given 02/19/18 2342)     Initial Impression / Assessment and Plan / ED Course  I have reviewed the triage vital signs and the nursing  notes.  Pertinent labs & imaging results that were available during my care of the patient were reviewed by me and considered in my medical decision making (see chart for details).     Pt was given IV fluids and IV pain medications.  She states her doctor's office did a stool sample for blood last week.  She was asked to give [place] a stool sample if she needs to go out in the ED.  I will check her for enteric pathogens.  Patient has already had a CT of the abdomen for this pain and a pelvic ultrasound.  These were not repeated.  Recheck at 1 AM patient has not had any further episodes of diarrhea.  She feels like she is able to tolerate oral fluids.  At time of discharge which was 3:40 AM patient was drinking fine, she did not have any diarrhea during her ED visit.  At this point it was felt she could be discharged home.  She was scheduled for an outpatient ultrasound of her gallbladder to look for gallstone disease although her CT scan did not show any abnormality.  If that is normal she was advised to follow-up with her primary care doctor to get a HIDA scan and if that is not revealing she may need a gastroenterologist evaluation.  She was given a gallbladder diet instruction sheet to follow for now.  Final Clinical Impressions(s) / ED Diagnoses   Final diagnoses:  RUQ abdominal pain  Diarrhea, unspecified type    ED Discharge Orders        Ordered    US Abdomen Limited RUQ/Gall Gladder     02/20/18 0344      Plan discharge  Devoria Albe, MD, Concha Pyo, MD 02/20/18 [phone]

## 2018-02-19 NOTE — ED Notes (Signed)
Pt provided urine specimen but was unable to provide stool sample.

## 2018-02-19 NOTE — ED Triage Notes (Signed)
Pt reports ruq pain x 2 weeks with diarrhea. Pt states she is currently on bentyl. Pt states she has been on bentyl since April 1st (prescribed by Dr. Lysbeth GalasNyland).

## 2018-02-20 ENCOUNTER — Ambulatory Visit (HOSPITAL_COMMUNITY)
Admit: 2018-02-20 | Discharge: 2018-02-20 | Disposition: A | Discharge status: Home / Self Care | Payer: BLUE CROSS/BLUE SHIELD | Source: Ambulatory Visit | Attending: Emergency Medicine | Admitting: Emergency Medicine

## 2018-02-20 DIAGNOSIS — R197 Diarrhea, unspecified: Secondary | ICD-10-CM | POA: Insufficient documentation

## 2018-02-20 DIAGNOSIS — R1011 Right upper quadrant pain: Secondary | ICD-10-CM | POA: Insufficient documentation

## 2018-02-20 NOTE — ED Notes (Signed)
EDP in with pt 

## 2018-02-20 NOTE — Discharge Instructions (Addendum)
Look at the gallbladder eating plan, try to avoid things that seem to make your pain worse.  Try to drink plenty of fluids so you do not get dehydrated from the diarrhea.  Please call this morning to get your outpatient ultrasound of your gallbladder scheduled.  Your family doctor can schedule you for a HIDA scan as an outpatient, this test tells how well your gallbladder functions.  If these tests are normal you should consider seeing a gastroenterologist to evaluate you further for the pain and the diarrhea you are having.  Avoid milk products until the diarrhea has been resolved.

## 2018-02-20 NOTE — ED Notes (Signed)
Pt sleeping. 

## 2018-02-20 NOTE — ED Notes (Signed)
Pt lying in her bed, on her cell phone, with no complaints.

## 2018-02-20 NOTE — ED Notes (Signed)
Pt has not had a BM since she has been in the ED

## 2018-03-14 ENCOUNTER — Emergency Department (HOSPITAL_COMMUNITY)
Admission: EM | Admit: 2018-03-14 | Discharge: 2018-03-14 | Disposition: A | Discharge status: Home / Self Care | Payer: BLUE CROSS/BLUE SHIELD | Attending: Emergency Medicine | Admitting: Emergency Medicine

## 2018-03-14 ENCOUNTER — Other Ambulatory Visit: Payer: Self-pay

## 2018-03-14 ENCOUNTER — Encounter (HOSPITAL_COMMUNITY): Payer: Self-pay | Admitting: Emergency Medicine

## 2018-03-14 DIAGNOSIS — Z79899 Other long term (current) drug therapy: Secondary | ICD-10-CM | POA: Insufficient documentation

## 2018-03-14 DIAGNOSIS — E039 Hypothyroidism, unspecified: Secondary | ICD-10-CM | POA: Diagnosis not present

## 2018-03-14 DIAGNOSIS — R1010 Upper abdominal pain, unspecified: Secondary | ICD-10-CM | POA: Diagnosis not present

## 2018-03-14 DIAGNOSIS — R1011 Right upper quadrant pain: Secondary | ICD-10-CM | POA: Diagnosis present

## 2018-03-14 LAB — CBC
HCT: 38.4 % (ref 36.0–46.0)
Hemoglobin: 12.8 g/dL (ref 12.0–15.0)
MCH: 32.4 pg (ref 26.0–34.0)
MCHC: 33.3 g/dL (ref 30.0–36.0)
MCV: 97.2 fL (ref 78.0–100.0)
Platelets: 251 10*3/uL (ref 150–400)
RBC: 3.95 MIL/uL (ref 3.87–5.11)
RDW: 12.8 % (ref 11.5–15.5)
WBC: 7 10*3/uL (ref 4.0–10.5)

## 2018-03-14 LAB — COMPREHENSIVE METABOLIC PANEL
ALT: 21 U/L (ref 14–54)
AST: 23 U/L (ref 15–41)
Albumin: 3.6 g/dL (ref 3.5–5.0)
Alkaline Phosphatase: 69 U/L (ref 38–126)
Anion gap: 6 (ref 5–15)
BUN: 9 mg/dL (ref 6–20)
CO2: 28 mmol/L (ref 22–32)
Calcium: 8.9 mg/dL (ref 8.9–10.3)
Chloride: 102 mmol/L (ref 101–111)
Creatinine, Ser: 0.79 mg/dL (ref 0.44–1.00)
GFR calc Af Amer: >60 mL/min (ref >60)
GFR calc non Af Amer: >60 mL/min (ref >60)
Glucose, Bld: 95 mg/dL (ref 65–99)
Potassium: 3.8 mmol/L (ref 3.5–5.1)
Sodium: 136 mmol/L (ref 135–145)
Total Bilirubin: 0.4 mg/dL (ref 0.3–1.2)
Total Protein: 7 g/dL (ref 6.5–8.1)

## 2018-03-14 LAB — DIFFERENTIAL
Basophils Absolute: 0 10*3/uL (ref 0.0–0.1)
Basophils Relative: 0 %
Eosinophils Absolute: 0.2 10*3/uL (ref 0.0–0.7)
Eosinophils Relative: 3 %
Lymphocytes Relative: 33 %
Lymphs Abs: 2.3 10*3/uL (ref 0.7–4.0)
Monocytes Absolute: 0.5 10*3/uL (ref 0.1–1.0)
Monocytes Relative: 7 %
Neutro Abs: 3.9 10*3/uL (ref 1.7–7.7)
Neutrophils Relative %: 57 %

## 2018-03-14 LAB — LIPASE, BLOOD: Lipase: 29 U/L (ref 11–51)

## 2018-03-14 MED ORDER — HYDROCODONE-ACETAMINOPHEN 5-325 MG PO TABS
1.0000 | ORAL_TABLET | Freq: Four times a day (QID) | ORAL | 0 refills | Status: DC | PRN
Start: 2018-03-14 — End: 2018-03-26

## 2018-03-14 MED ORDER — HYDROMORPHONE HCL 1 MG/ML IJ SOLN
1.0000 mg | Freq: Once | INTRAMUSCULAR | Status: DC
  Filled 2018-03-14: qty 1

## 2018-03-14 MED ORDER — ONDANSETRON 4 MG PO TBDP
4.0000 mg | ORAL_TABLET | Freq: Once | ORAL | Status: AC
  Administered 2018-03-14: 4 mg via ORAL
  Filled 2018-03-14: qty 1

## 2018-03-14 MED ORDER — HYDROMORPHONE HCL 1 MG/ML IJ SOLN
1.0000 mg | Freq: Once | INTRAMUSCULAR | Status: AC
  Administered 2018-03-14: 1 mg via INTRAVENOUS

## 2018-03-14 MED ORDER — ONDANSETRON 4 MG PO TBDP: ORAL_TABLET | ORAL | 0 refills | Status: DC

## 2018-03-14 NOTE — ED Provider Notes (Signed)
[facility] EMERGENCY DEPARTMENT Provider Note   CSN: [id number] Arrival date & time: 03/14/18  1811     History   Chief Complaint Chief Complaint  Patient presents with  . Abdominal Pain    HPI Hannah Lynch is a 47 y.o. female.  Patient complains of right upper quadrant abdominal pain.  She has been worked up with ultrasound and CT and upper endoscopy.  All has been negative.  She has a Careers adviser that she follows and her surgeon did have her get a HIDA scan.  The history is provided by the patient. No language interpreter was used.  Abdominal Pain   This is a chronic problem. The current episode started 12 to 24 hours ago. The problem occurs constantly. The problem has not changed since onset.The pain is associated with an unknown factor. The pain is located in the RUQ. The quality of the pain is aching. The pain is at a severity of 5/10. The pain is moderate. Pertinent negatives include diarrhea, flatus, frequency, hematuria and headaches. Nothing aggravates the symptoms.    Past Medical History:  Diagnosis Date  . Anxiety   . Degenerative disc disease, lumbar   . Depression   . Family history of adverse reaction to anesthesia    pt's mother has hx. of post-op N/V  . Hypothyroidism   . Nasal septal deviation 11/2017  . Nasal turbinate hypertrophy 11/2017  . PONV (postoperative nausea and vomiting)   . Snoring 11/2017    Patient Active Problem List   Diagnosis Date Noted  . Primary insomnia 06/25/2017  . Papanicolaou smear for cervical cancer screening 06/25/2017  . Bilateral hearing loss 06/25/2017  . Acute serous otitis media 06/25/2017  . Lumbar stenosis with neurogenic claudication 09/25/2014    Past Surgical History:  Procedure Laterality Date  . ABDOMINAL HYSTERECTOMY  2005   partial  . ANTERIOR CERVICAL DECOMP/DISCECTOMY FUSION  03/25/2007   C4-5  . CERVICAL FUSION  2013  . KNEE ARTHROSCOPY Right   . LUMBAR LAMINECTOMY/DECOMPRESSION MICRODISCECTOMY   11/21/2004   L4-5  . MAXIMUM ACCESS (MAS)POSTERIOR LUMBAR INTERBODY FUSION (PLIF) 1 LEVEL N/A 09/25/2014   Procedure: Lumbar four-five MAXIMUM ACCESS (MAS) POSTERIOR LUMBAR INTERBODY FUSION (PLIF) 1 LEVEL;  Surgeon: Maeola Harman, MD;  Location: [facility];  Service: Neurosurgery;  Laterality: N/A;  . NASAL SEPTOPLASTY W/ TURBINOPLASTY N/A 12/07/2017   Procedure: NASAL SEPTOPLASTY WITH BILATERAL TURBINATE REDUCTION;  Surgeon: Christia Reading, MD;  Location: [facility];  Service: ENT;  Laterality: N/A;  . ORIF ANKLE FRACTURE Right   . UPPER GI ENDOSCOPY       OB History   None      Home Medications    Prior to Admission medications   Medication Sig Start Date End Date Taking? Authorizing Provider  citalopram (CELEXA) 20 MG tablet Take 20 mg by mouth daily.   Yes [provider]  doxepin (SINEQUAN) 25 MG capsule Take 1 capsule by mouth at bedtime. 03/10/18 03/10/19 Yes [provider]  gabapentin (NEURONTIN) 300 MG capsule Take 300 mg by mouth 3 (three) times daily.   Yes [provider]  levothyroxine (SYNTHROID, LEVOTHROID) 25 MCG tablet Take 25 mcg by mouth daily.  01/23/17 03/14/18 Yes [provider]  LORazepam (ATIVAN) 0.5 MG tablet Take 1 tablet by mouth as needed. 07/09/17  Yes [provider]  SUMAtriptan (IMITREX) 50 MG tablet Take 1 tablet by mouth as needed. 10/14/17  Yes [provider]  zolpidem (AMBIEN) 10 MG  tablet Take 1 tablet by mouth as needed. 10/09/17  Yes [provider]  Cyanocobalamin (VITAMIN B 12 PO) Take by mouth.    [provider]  HYDROcodone-acetaminophen (NORCO/VICODIN) 5-325 MG tablet Take 1 tablet by mouth every 6 (six) hours as needed for moderate pain. 03/14/18   Bethann Berkshire, MD  naproxen (NAPROSYN) 500 MG tablet Take 1 tablet (500 mg total) by mouth 2 (two) times daily. Patient not taking: Reported on 03/14/2018 02/01/18   Glynn Octave, MD  ondansetron (ZOFRAN ODT) 4 MG  disintegrating tablet  ODT q4 hours prn nausea/vomit 03/14/18   Bethann Berkshire, MD  terbinafine (LAMISIL) 250 MG tablet Take 250 mg by mouth daily.    [provider]    Family History Family History  Problem Relation Age of Onset  . Hypertension Mother   . Diabetes Mother   . Heart failure Father   . Hypertension Father   . Diabetes Father   . Heart failure Other     Social History Social History   Tobacco Use  . Smoking status: Never Smoker  . Smokeless tobacco: Never Used  Substance Use Topics  . Alcohol use: No    Frequency: Never  . Drug use: No     Allergies   Patient has no known allergies.   Review of Systems Review of Systems  Constitutional: Negative for appetite change and fatigue.  HENT: Negative for congestion, ear discharge and sinus pressure.   Eyes: Negative for discharge.  Respiratory: Negative for cough.   Cardiovascular: Negative for chest pain.  Gastrointestinal: Positive for abdominal pain. Negative for diarrhea and flatus.  Genitourinary: Negative for frequency and hematuria.  Musculoskeletal: Negative for back pain.  Skin: Negative for rash.  Neurological: Negative for seizures and headaches.  Psychiatric/Behavioral: Negative for hallucinations.     Physical Exam Updated Vital Signs BP 118/86 (BP Location: Left Arm)   Pulse 88   Temp 98 F (36.7 C) (Oral)   Resp 17   Ht  (1.6 m)   Wt 92.5 kg (204 lb)   SpO2 100%   BMI 36.14 kg/m   Physical Exam  Constitutional: She is oriented to person, place, and time. She appears well-developed.  HENT:  Head: Normocephalic.  Eyes: Conjunctivae and EOM are normal. No scleral icterus.  Neck: Neck supple. No thyromegaly present.  Cardiovascular: Normal rate and regular rhythm. Exam reveals no gallop and no friction rub.  No murmur heard. Pulmonary/Chest: No stridor. She has no wheezes. She has no rales. She exhibits no tenderness.  Abdominal: She exhibits no distension. There  is tenderness. There is no rebound.  Musculoskeletal: Normal range of motion. She exhibits no edema.  Lymphadenopathy:    She has no cervical adenopathy.  Neurological: She is oriented to person, place, and time. She exhibits normal muscle tone. Coordination normal.  Skin: No rash noted. No erythema.  Psychiatric: She has a normal mood and affect. Her behavior is normal.     ED Treatments / Results  Labs (all labs ordered are listed, but only abnormal results are displayed) Labs Reviewed  LIPASE, BLOOD  COMPREHENSIVE METABOLIC PANEL  CBC  DIFFERENTIAL    EKG None  Radiology No results found.  Procedures Procedures (including critical care time)  Medications Ordered in ED Medications  ondansetron (ZOFRAN-ODT) disintegrating tablet 4 mg (4 mg Oral Given 03/14/18 1919)  HYDROmorphone (DILAUDID) injection 1 mg (1 mg Intravenous Given 03/14/18 1923)     Initial Impression / Assessment and Plan / ED  Course  I have reviewed the triage vital signs and the nursing notes.  Pertinent labs & imaging results that were available during my care of the patient were reviewed by me and considered in my medical decision making (see chart for details).     Labs all unremarkable.  Patient's pain improved with Dilaudid.  Patient will be sent home with pain medicine nausea medicine.   she is to follow-up with her general surgeon Final Clinical Impressions(s) / ED Diagnoses   Final diagnoses:  Pain of upper abdomen    ED Discharge Orders        Ordered    HYDROcodone-acetaminophen (NORCO/VICODIN) 5-325 MG tablet  Every 6 hours PRN     03/14/18 2046    ondansetron (ZOFRAN ODT) 4 MG disintegrating tablet     03/14/18 2046       Bethann Berkshire, MD 03/14/18 2050

## 2018-03-14 NOTE — Discharge Instructions (Addendum)
Follow-up with your general surgeon this week for recheck

## 2018-03-14 NOTE — ED Triage Notes (Signed)
Patient c/o right upper abd pain that radiates into back. Per patient intermittent for past 6 months, started today approx 4 hours ago. Patient states has had hida scan (gallbladder working at 22%) and endoscopy (no ulcers, biopsy done). Patient placed on Doxepin  by Dr Reed Pandy with no relief. Patient states nausea and vomiting. Denies any diarrhea, fevers, or urinary symptoms.

## 2018-03-23 ENCOUNTER — Encounter: Payer: Self-pay | Admitting: General Surgery

## 2018-03-23 ENCOUNTER — Ambulatory Visit: Payer: BLUE CROSS/BLUE SHIELD | Admitting: General Surgery

## 2018-03-23 VITALS — BP 135/88 | HR 91 | Temp 97.3°F | Resp 18 | Wt 209.0 lb

## 2018-03-23 DIAGNOSIS — K811 Chronic cholecystitis: Secondary | ICD-10-CM

## 2018-03-23 NOTE — H&P (Signed)
Hannah Lynch; [id number]; 12/17/1970   HPI Patient is a 47-year-old white female who was referred to my care by Dr. Hemberg for evaluation and treatment of right upper quadrant abdominal pain.  She has had right upper quadrant abdominal pain with radiation to the right flank, fatty food intolerance, and nausea for approximately 3 months.  Seems to be worsening and becoming more frequent.  She has been seen by the emergency room in the recent past.  She denies any fever, chills, or jaundice.  She has 7 out of 10 abdominal pain.  She did have an ultrasound of the gallbladder which was negative.  HIDA scan revealed a low gallbladder ejection fraction. Past Medical History:  Diagnosis Date  . Anxiety   . Degenerative disc disease, lumbar   . Depression   . Family history of adverse reaction to anesthesia    pt's mother has hx. of post-op N/V  . Hypothyroidism   . Nasal septal deviation 11/2017  . Nasal turbinate hypertrophy 11/2017  . PONV (postoperative nausea and vomiting)   . Snoring 11/2017    Past Surgical History:  Procedure Laterality Date  . ABDOMINAL HYSTERECTOMY  2005   partial  . ANTERIOR CERVICAL DECOMP/DISCECTOMY FUSION  03/25/2007   C4-5  . CERVICAL FUSION  2013  . KNEE ARTHROSCOPY Right   . LUMBAR LAMINECTOMY/DECOMPRESSION MICRODISCECTOMY  11/21/2004   L4-5  . MAXIMUM ACCESS (MAS)POSTERIOR LUMBAR INTERBODY FUSION (PLIF) 1 LEVEL N/A 09/25/2014   Procedure: Lumbar four-five MAXIMUM ACCESS (MAS) POSTERIOR LUMBAR INTERBODY FUSION (PLIF) 1 LEVEL;  Surgeon: Joseph Stern, MD;  Location: [facility];  Service: Neurosurgery;  Laterality: N/A;  . NASAL SEPTOPLASTY W/ TURBINOPLASTY N/A 12/07/2017   Procedure: NASAL SEPTOPLASTY WITH BILATERAL TURBINATE REDUCTION;  Surgeon: Bates, Dwight, MD;  Location: [facility];  Service: ENT;  Laterality: N/A;  . ORIF ANKLE FRACTURE Right   . UPPER GI ENDOSCOPY      Family History  Problem Relation Age of Onset  . Hypertension  Mother   . Diabetes Mother   . Heart failure Father   . Hypertension Father   . Diabetes Father   . Heart failure Other     Current Outpatient Medications on File Prior to Visit  Medication Sig Dispense Refill  . citalopram (CELEXA) 20 MG tablet Take 20 mg by mouth daily.    . Cyanocobalamin (VITAMIN B 12 PO) Take by mouth.    . doxepin (SINEQUAN) 25 MG capsule Take 1 capsule by mouth at bedtime.    . gabapentin (NEURONTIN) 300 MG capsule Take 300 mg by mouth 3 (three) times daily.    . HYDROcodone-acetaminophen (NORCO/VICODIN) 5-325 MG tablet Take 1 tablet by mouth every 6 (six) hours as needed for moderate pain. 20 tablet 0  . levothyroxine (SYNTHROID, LEVOTHROID) 25 MCG tablet Take 25 mcg by mouth daily.     . LORazepam (ATIVAN) 0.5 MG tablet Take 1 tablet by mouth as needed.    . naproxen (NAPROSYN) 500 MG tablet Take 1 tablet (500 mg total) by mouth 2 (two) times daily. (Patient not taking: Reported on 03/14/2018) 30 tablet 0  . ondansetron (ZOFRAN ODT) 4 MG disintegrating tablet 4mg ODT q4 hours prn nausea/vomit 12 tablet 0  . SUMAtriptan (IMITREX) 50 MG tablet Take 1 tablet by mouth as needed.    . terbinafine (LAMISIL) 250 MG tablet Take 250 mg by mouth daily.    . zolpidem (AMBIEN) 10 MG tablet Take 1 tablet by mouth as needed.       Current Facility-Administered Medications on File Prior to Visit  Medication Dose Route Frequency Provider Last Rate Last Dose  . lactated ringers infusion    Continuous PRN Berry, Tabatha J, CRNA        No Known Allergies  Social History   Substance and Sexual Activity  Alcohol Use No  . Frequency: Never    Social History   Tobacco Use  Smoking Status Never Smoker  Smokeless Tobacco Never Used    Review of Systems  Constitutional: Negative.   HENT: Negative.   Eyes: Negative.   Respiratory: Negative.   Cardiovascular: Negative.   Gastrointestinal: Positive for abdominal pain and nausea.  Genitourinary: Negative.    Musculoskeletal: Positive for back pain and neck pain.  Skin: Negative.   Neurological: Positive for headaches.  Endo/Heme/Allergies: Negative.   Psychiatric/Behavioral: Negative.     Objective   Vitals:   03/23/18 1332  BP: 135/88  Pulse: 91  Resp: 18  Temp: (!) 97.3 F (36.3 C)    Physical Exam  Constitutional: She is oriented to person, place, and time. She appears well-developed and well-nourished.  HENT:  Head: Normocephalic and atraumatic.  Cardiovascular: Normal rate, regular rhythm and normal heart sounds. Exam reveals no gallop.  No murmur heard. Pulmonary/Chest: Effort normal and breath sounds normal. No stridor. No respiratory distress. She has no wheezes. She has no rales.  Abdominal: Soft. Normal appearance and bowel sounds are normal. She exhibits no mass. There is no tenderness. There is no rebound and no guarding.  Neurological: She is alert and oriented to person, place, and time.  Skin: Skin is warm and dry.  Vitals reviewed. Ultrasound and HIDA scan results reviewed  Assessment  Chronic cholecystitis Plan   Patient is scheduled for laparoscopic cholecystectomy on 03/26/2018.  The risks and benefits of the procedure including bleeding, infection, hepatobiliary injury, and the possibility of an open procedure were fully explained to the patient, who gave informed consent. 

## 2018-03-23 NOTE — Patient Instructions (Signed)

## 2018-03-23 NOTE — Progress Notes (Signed)
Hannah Lynch; [id number]; May 27, 1971   HPI Patient is a 47 year old white female who was referred to my care by Dr. Ihor Austin for evaluation and treatment of right upper quadrant abdominal pain.  She has had right upper quadrant abdominal pain with radiation to the right flank, fatty food intolerance, and nausea for approximately 3 months.  Seems to be worsening and becoming more frequent.  She has been seen by the emergency room in the recent past.  She denies any fever, chills, or jaundice.  She has 7 out of 10 abdominal pain.  She did have an ultrasound of the gallbladder which was negative.  HIDA scan revealed a low gallbladder ejection fraction. Past Medical History:  Diagnosis Date  . Anxiety   . Degenerative disc disease, lumbar   . Depression   . Family history of adverse reaction to anesthesia    pt's mother has hx. of post-op N/V  . Hypothyroidism   . Nasal septal deviation 11/2017  . Nasal turbinate hypertrophy 11/2017  . PONV (postoperative nausea and vomiting)   . Snoring 11/2017    Past Surgical History:  Procedure Laterality Date  . ABDOMINAL HYSTERECTOMY  2005   partial  . ANTERIOR CERVICAL DECOMP/DISCECTOMY FUSION  03/25/2007   C4-5  . CERVICAL FUSION  2013  . KNEE ARTHROSCOPY Right   . LUMBAR LAMINECTOMY/DECOMPRESSION MICRODISCECTOMY  11/21/2004   L4-5  . MAXIMUM ACCESS (MAS)POSTERIOR LUMBAR INTERBODY FUSION (PLIF) 1 LEVEL N/A 09/25/2014   Procedure: Lumbar four-five MAXIMUM ACCESS (MAS) POSTERIOR LUMBAR INTERBODY FUSION (PLIF) 1 LEVEL;  Surgeon: Maeola Harman, MD;  Location: [facility];  Service: Neurosurgery;  Laterality: N/A;  . NASAL SEPTOPLASTY W/ TURBINOPLASTY N/A 12/07/2017   Procedure: NASAL SEPTOPLASTY WITH BILATERAL TURBINATE REDUCTION;  Surgeon: Christia Reading, MD;  Location: [facility];  Service: ENT;  Laterality: N/A;  . ORIF ANKLE FRACTURE Right   . UPPER GI ENDOSCOPY      Family History  Problem Relation Age of Onset  . Hypertension  Mother   . Diabetes Mother   . Heart failure Father   . Hypertension Father   . Diabetes Father   . Heart failure Other     Current Outpatient Medications on File Prior to Visit  Medication Sig Dispense Refill  . citalopram (CELEXA) 20 MG tablet Take 20 mg by mouth daily.    . Cyanocobalamin (VITAMIN B 12 PO) Take by mouth.    . doxepin (SINEQUAN) 25 MG capsule Take 1 capsule by mouth at bedtime.    . gabapentin (NEURONTIN) 300 MG capsule Take 300 mg by mouth 3 (three) times daily.    Marland Kitchen HYDROcodone-acetaminophen (NORCO/VICODIN) 5-325 MG tablet Take 1 tablet by mouth every 6 (six) hours as needed for moderate pain. 20 tablet 0  . levothyroxine (SYNTHROID, LEVOTHROID) 25 MCG tablet Take 25 mcg by mouth daily.     Marland Kitchen LORazepam (ATIVAN) 0.5 MG tablet Take 1 tablet by mouth as needed.    . naproxen (NAPROSYN) 500 MG tablet Take 1 tablet (500 mg total) by mouth 2 (two) times daily. (Patient not taking: Reported on 03/14/2018) 30 tablet 0  . ondansetron (ZOFRAN ODT) 4 MG disintegrating tablet  ODT q4 hours prn nausea/vomit 12 tablet 0  . SUMAtriptan (IMITREX) 50 MG tablet Take 1 tablet by mouth as needed.    . terbinafine (LAMISIL) 250 MG tablet Take 250 mg by mouth daily.    Marland Kitchen zolpidem (AMBIEN) 10 MG tablet Take 1 tablet by mouth as needed.  Current Facility-Administered Medications on File Prior to Visit  Medication Dose Route Frequency Provider Last Rate Last Dose  . lactated ringers infusion    Continuous PRN Jeani Hawking, CRNA        No Known Allergies  Social History   Substance and Sexual Activity  Alcohol Use No  . Frequency: Never    Social History   Tobacco Use  Smoking Status Never Smoker  Smokeless Tobacco Never Used    Review of Systems  Constitutional: Negative.   HENT: Negative.   Eyes: Negative.   Respiratory: Negative.   Cardiovascular: Negative.   Gastrointestinal: Positive for abdominal pain and nausea.  Genitourinary: Negative.    Musculoskeletal: Positive for back pain and neck pain.  Skin: Negative.   Neurological: Positive for headaches.  Endo/Heme/Allergies: Negative.   Psychiatric/Behavioral: Negative.     Objective   Vitals:   03/23/18 1332  BP: 135/88  Pulse: 91  Resp: 18  Temp: (!) 97.3 F (36.3 C)    Physical Exam  Constitutional: She is oriented to person, place, and time. She appears well-developed and well-nourished.  HENT:  Head: Normocephalic and atraumatic.  Cardiovascular: Normal rate, regular rhythm and normal heart sounds. Exam reveals no gallop.  No murmur heard. Pulmonary/Chest: Effort normal and breath sounds normal. No stridor. No respiratory distress. She has no wheezes. She has no rales.  Abdominal: Soft. Normal appearance and bowel sounds are normal. She exhibits no mass. There is no tenderness. There is no rebound and no guarding.  Neurological: She is alert and oriented to person, place, and time.  Skin: Skin is warm and dry.  Vitals reviewed. Ultrasound and HIDA scan results reviewed  Assessment  Chronic cholecystitis Plan   Patient is scheduled for laparoscopic cholecystectomy on 03/26/2018.  The risks and benefits of the procedure including bleeding, infection, hepatobiliary injury, and the possibility of an open procedure were fully explained to the patient, who gave informed consent.

## 2018-03-25 ENCOUNTER — Encounter (HOSPITAL_COMMUNITY)
Admission: RE | Admit: 2018-03-25 | Discharge: 2018-03-25 | Disposition: A | Discharge status: Home / Self Care | Payer: BLUE CROSS/BLUE SHIELD | Source: Ambulatory Visit | Attending: General Surgery | Admitting: General Surgery

## 2018-03-26 ENCOUNTER — Encounter (HOSPITAL_COMMUNITY)
Admission: RE | Disposition: A | Discharge status: Home / Self Care | Payer: Self-pay | Source: Ambulatory Visit | Attending: General Surgery

## 2018-03-26 ENCOUNTER — Ambulatory Visit (HOSPITAL_COMMUNITY): Payer: BLUE CROSS/BLUE SHIELD | Admitting: Anesthesiology

## 2018-03-26 ENCOUNTER — Ambulatory Visit (HOSPITAL_COMMUNITY)
Admission: RE | Admit: 2018-03-26 | Discharge: 2018-03-26 | Disposition: A | Discharge status: Home / Self Care | Payer: BLUE CROSS/BLUE SHIELD | Source: Ambulatory Visit | Attending: General Surgery | Admitting: General Surgery

## 2018-03-26 ENCOUNTER — Encounter (HOSPITAL_COMMUNITY): Payer: Self-pay | Admitting: *Deleted

## 2018-03-26 DIAGNOSIS — M1990 Unspecified osteoarthritis, unspecified site: Secondary | ICD-10-CM | POA: Insufficient documentation

## 2018-03-26 DIAGNOSIS — Z8249 Family history of ischemic heart disease and other diseases of the circulatory system: Secondary | ICD-10-CM | POA: Diagnosis not present

## 2018-03-26 DIAGNOSIS — K811 Chronic cholecystitis: Secondary | ICD-10-CM

## 2018-03-26 DIAGNOSIS — F329 Major depressive disorder, single episode, unspecified: Secondary | ICD-10-CM | POA: Diagnosis not present

## 2018-03-26 DIAGNOSIS — Z981 Arthrodesis status: Secondary | ICD-10-CM | POA: Diagnosis not present

## 2018-03-26 DIAGNOSIS — E039 Hypothyroidism, unspecified: Secondary | ICD-10-CM | POA: Diagnosis not present

## 2018-03-26 DIAGNOSIS — F419 Anxiety disorder, unspecified: Secondary | ICD-10-CM | POA: Diagnosis not present

## 2018-03-26 DIAGNOSIS — Z79899 Other long term (current) drug therapy: Secondary | ICD-10-CM | POA: Insufficient documentation

## 2018-03-26 HISTORY — DX: Headache: R51

## 2018-03-26 HISTORY — DX: Headache, unspecified: R51.9

## 2018-03-26 HISTORY — PX: CHOLECYSTECTOMY: SHX55

## 2018-03-26 SURGERY — LAPAROSCOPIC CHOLECYSTECTOMY
Anesthesia: General

## 2018-03-26 MED ORDER — KETOROLAC TROMETHAMINE 30 MG/ML IJ SOLN: 30.0000 mg | Freq: Once | INTRAMUSCULAR | Status: DC

## 2018-03-26 MED ORDER — HYDROCODONE-ACETAMINOPHEN 5-325 MG PO TABS: 1.0000 | ORAL_TABLET | ORAL | 0 refills | Status: DC | PRN

## 2018-03-26 MED ORDER — CIPROFLOXACIN IN D5W 400 MG/200ML IV SOLN
INTRAVENOUS | Status: AC
  Filled 2018-03-26: qty 200

## 2018-03-26 MED ORDER — LIDOCAINE HCL (CARDIAC) PF 100 MG/5ML IV SOSY
PREFILLED_SYRINGE | INTRAVENOUS | Status: DC | PRN
  Administered 2018-03-26: 50 mg via INTRAVENOUS

## 2018-03-26 MED ORDER — FENTANYL CITRATE (PF) 100 MCG/2ML IJ SOLN
INTRAMUSCULAR | Status: DC | PRN
  Administered 2018-03-26 (×5): 50 ug via INTRAVENOUS

## 2018-03-26 MED ORDER — GLYCOPYRROLATE 0.2 MG/ML IJ SOLN
INTRAMUSCULAR | Status: AC
  Filled 2018-03-26: qty 1

## 2018-03-26 MED ORDER — BUPIVACAINE HCL (PF) 0.5 % IJ SOLN
INTRAMUSCULAR | Status: AC
Start: 2018-03-26 — End: ?
  Filled 2018-03-26: qty 30

## 2018-03-26 MED ORDER — KETOROLAC TROMETHAMINE 30 MG/ML IJ SOLN
INTRAMUSCULAR | Status: AC
  Filled 2018-03-26: qty 1

## 2018-03-26 MED ORDER — ARTIFICIAL TEARS OPHTHALMIC OINT
TOPICAL_OINTMENT | OPHTHALMIC | Status: AC
  Filled 2018-03-26: qty 3.5

## 2018-03-26 MED ORDER — FENTANYL CITRATE (PF) 250 MCG/5ML IJ SOLN
INTRAMUSCULAR | Status: AC
  Filled 2018-03-26: qty 5

## 2018-03-26 MED ORDER — POVIDONE-IODINE 10 % OINT PACKET
TOPICAL_OINTMENT | CUTANEOUS | Status: DC | PRN
  Administered 2018-03-26: 1 via TOPICAL

## 2018-03-26 MED ORDER — SUGAMMADEX SODIUM 200 MG/2ML IV SOLN
INTRAVENOUS | Status: DC | PRN
  Administered 2018-03-26: 189.6 mg via INTRAVENOUS

## 2018-03-26 MED ORDER — SCOPOLAMINE 1 MG/3DAYS TD PT72
MEDICATED_PATCH | TRANSDERMAL | Status: AC
  Filled 2018-03-26: qty 1

## 2018-03-26 MED ORDER — HYDROCODONE-ACETAMINOPHEN 7.5-325 MG PO TABS
1.0000 | ORAL_TABLET | Freq: Once | ORAL | Status: AC | PRN
  Administered 2018-03-26: 1 via ORAL
  Filled 2018-03-26: qty 1

## 2018-03-26 MED ORDER — ROCURONIUM BROMIDE 100 MG/10ML IV SOLN
INTRAVENOUS | Status: DC | PRN
  Administered 2018-03-26: 25 mg via INTRAVENOUS

## 2018-03-26 MED ORDER — SODIUM CHLORIDE 0.9 % IR SOLN
Status: DC | PRN
  Administered 2018-03-26: 1

## 2018-03-26 MED ORDER — SUGAMMADEX SODIUM 200 MG/2ML IV SOLN
INTRAVENOUS | Status: AC
  Filled 2018-03-26: qty 2

## 2018-03-26 MED ORDER — ONDANSETRON HCL 4 MG/2ML IJ SOLN: 4.0000 mg | Freq: Once | INTRAMUSCULAR | Status: DC | PRN

## 2018-03-26 MED ORDER — MIDAZOLAM HCL 2 MG/2ML IJ SOLN
INTRAMUSCULAR | Status: AC
  Filled 2018-03-26: qty 2

## 2018-03-26 MED ORDER — ONDANSETRON HCL 4 MG/2ML IJ SOLN
INTRAMUSCULAR | Status: AC
  Filled 2018-03-26: qty 2

## 2018-03-26 MED ORDER — MIDAZOLAM HCL 5 MG/5ML IJ SOLN
INTRAMUSCULAR | Status: DC | PRN
  Administered 2018-03-26: 2 mg via INTRAVENOUS

## 2018-03-26 MED ORDER — HYDROMORPHONE HCL 1 MG/ML IJ SOLN
0.2500 mg | INTRAMUSCULAR | Status: DC | PRN
  Administered 2018-03-26: 0.5 mg via INTRAVENOUS
  Filled 2018-03-26: qty 0.5

## 2018-03-26 MED ORDER — KETOROLAC TROMETHAMINE 30 MG/ML IJ SOLN
30.0000 mg | Freq: Once | INTRAMUSCULAR | Status: AC | PRN
  Administered 2018-03-26: 30 mg via INTRAVENOUS

## 2018-03-26 MED ORDER — ONDANSETRON HCL 4 MG/2ML IJ SOLN
INTRAMUSCULAR | Status: DC | PRN
  Administered 2018-03-26: 4 mg via INTRAVENOUS

## 2018-03-26 MED ORDER — CHLORHEXIDINE GLUCONATE CLOTH 2 % EX PADS: 6.0000 | MEDICATED_PAD | Freq: Once | CUTANEOUS | Status: DC

## 2018-03-26 MED ORDER — CIPROFLOXACIN IN D5W 400 MG/200ML IV SOLN
400.0000 mg | INTRAVENOUS | Status: AC
  Administered 2018-03-26: 400 mg via INTRAVENOUS

## 2018-03-26 MED ORDER — SUCCINYLCHOLINE CHLORIDE 20 MG/ML IJ SOLN
INTRAMUSCULAR | Status: DC | PRN
  Administered 2018-03-26: 120 mg via INTRAVENOUS

## 2018-03-26 MED ORDER — ROCURONIUM BROMIDE 50 MG/5ML IV SOLN
INTRAVENOUS | Status: AC
  Filled 2018-03-26: qty 2

## 2018-03-26 MED ORDER — POVIDONE-IODINE 10 % EX OINT
TOPICAL_OINTMENT | CUTANEOUS | Status: AC
  Filled 2018-03-26: qty 1

## 2018-03-26 MED ORDER — PROPOFOL 10 MG/ML IV BOLUS
INTRAVENOUS | Status: AC
  Filled 2018-03-26: qty 40

## 2018-03-26 MED ORDER — DEXAMETHASONE SODIUM PHOSPHATE 4 MG/ML IJ SOLN
INTRAMUSCULAR | Status: DC | PRN
  Administered 2018-03-26: 4 mg via INTRAVENOUS

## 2018-03-26 MED ORDER — DEXAMETHASONE SODIUM PHOSPHATE 4 MG/ML IJ SOLN
INTRAMUSCULAR | Status: AC
Start: 2018-03-26 — End: ?
  Filled 2018-03-26: qty 1

## 2018-03-26 MED ORDER — MEPERIDINE HCL 50 MG/ML IJ SOLN: 6.2500 mg | INTRAMUSCULAR | Status: DC | PRN

## 2018-03-26 MED ORDER — PROPOFOL 10 MG/ML IV BOLUS
INTRAVENOUS | Status: DC | PRN
  Administered 2018-03-26: 150 mg via INTRAVENOUS

## 2018-03-26 MED ORDER — BUPIVACAINE HCL (PF) 0.5 % IJ SOLN
INTRAMUSCULAR | Status: DC | PRN
  Administered 2018-03-26: 10 mL

## 2018-03-26 MED ORDER — SCOPOLAMINE 1 MG/3DAYS TD PT72
MEDICATED_PATCH | TRANSDERMAL | Status: DC | PRN
  Administered 2018-03-26: 1.5 mg via TRANSDERMAL

## 2018-03-26 SURGICAL SUPPLY — 46 items
APPLIER CLIP ROT 10 11.4 M/L (STAPLE) ×2
APR CLP MED LRG 11.4X10 (STAPLE) ×1
BAG RETRIEVAL 10 (BASKET) ×1
CHLORAPREP W/TINT 26ML (MISCELLANEOUS) ×2 IMPLANT
CLIP APPLIE ROT 10 11.4 M/L (STAPLE) ×1 IMPLANT
CLOTH BEACON ORANGE TIMEOUT ST (SAFETY) ×2 IMPLANT
COVER LIGHT HANDLE STERIS (MISCELLANEOUS) ×4 IMPLANT
DECANTER SPIKE VIAL GLASS SM (MISCELLANEOUS) ×2 IMPLANT
ELECT REM PT RETURN 9FT ADLT (ELECTROSURGICAL) ×2
ELECTRODE REM PT RTRN 9FT ADLT (ELECTROSURGICAL) ×1 IMPLANT
FILTER SMOKE EVAC LAPAROSHD (FILTER) ×2 IMPLANT
GAUZE SPONGE 2X2 8PLY STRL LF (GAUZE/BANDAGES/DRESSINGS) IMPLANT
GLOVE BIOGEL PI IND STRL 7.0 (GLOVE) ×1 IMPLANT
GLOVE BIOGEL PI INDICATOR 7.0 (GLOVE) ×1
GLOVE SURG SS PI 7.5 STRL IVOR (GLOVE) ×2 IMPLANT
GOWN STRL REUS W/ TWL XL LVL3 (GOWN DISPOSABLE) ×1 IMPLANT
GOWN STRL REUS W/TWL LRG LVL3 (GOWN DISPOSABLE) ×4 IMPLANT
GOWN STRL REUS W/TWL XL LVL3 (GOWN DISPOSABLE) ×2
HEMOSTAT SNOW SURGICEL 2X4 (HEMOSTASIS) ×2 IMPLANT
INST SET LAPROSCOPIC AP (KITS) ×2 IMPLANT
IV NS IRRIG 3000ML ARTHROMATIC (IV SOLUTION) IMPLANT
KIT TURNOVER KIT A (KITS) ×2 IMPLANT
MANIFOLD NEPTUNE II (INSTRUMENTS) ×2 IMPLANT
NDL HYPO 25X1 1.5 SAFETY (NEEDLE) ×1 IMPLANT
NDL INSUFFLATION 14GA 120MM (NEEDLE) ×1 IMPLANT
NEEDLE HYPO 25X1 1.5 SAFETY (NEEDLE) ×2 IMPLANT
NEEDLE INSUFFLATION 14GA 120MM (NEEDLE) ×2 IMPLANT
NS IRRIG 1000ML POUR BTL (IV SOLUTION) ×2 IMPLANT
PACK LAP CHOLE LZT030E (CUSTOM PROCEDURE TRAY) ×2 IMPLANT
PAD ARMBOARD 7.5X6 YLW CONV (MISCELLANEOUS) ×2 IMPLANT
SET BASIN LINEN APH (SET/KITS/TRAYS/PACK) ×2 IMPLANT
SET TUBE IRRIG SUCTION NO TIP (IRRIGATION / IRRIGATOR) IMPLANT
SLEEVE ENDOPATH XCEL 5M (ENDOMECHANICALS) ×2 IMPLANT
SPONGE GAUZE 2X2 8PLY STRL LF (GAUZE/BANDAGES/DRESSINGS) ×8 IMPLANT
SPONGE GAUZE 2X2 STER 10/PKG (GAUZE/BANDAGES/DRESSINGS) ×1
STAPLER VISISTAT (STAPLE) ×2 IMPLANT
SUT VICRYL 0 UR6 27IN ABS (SUTURE) ×2 IMPLANT
SYS BAG RETRIEVAL 10MM (BASKET) ×1
SYSTEM BAG RETRIEVAL 10MM (BASKET) ×1 IMPLANT
TAPE CLOTH SURG 4X10 WHT LF (GAUZE/BANDAGES/DRESSINGS) ×1 IMPLANT
TROCAR ENDO BLADELESS 11MM (ENDOMECHANICALS) ×2 IMPLANT
TROCAR XCEL NON-BLD 5MMX100MML (ENDOMECHANICALS) ×2 IMPLANT
TROCAR XCEL UNIV SLVE 11M 100M (ENDOMECHANICALS) ×2 IMPLANT
TUBE CONNECTING 12X1/4 (SUCTIONS) ×2 IMPLANT
TUBING INSUFFLATION (TUBING) ×2 IMPLANT
WARMER LAPAROSCOPE (MISCELLANEOUS) ×2 IMPLANT

## 2018-03-26 NOTE — Anesthesia Postprocedure Evaluation (Signed)
Anesthesia Post Note  Patient: Hannah Lynch  Procedure(s) Performed: LAPAROSCOPIC CHOLECYSTECTOMY (N/A )  Patient location during evaluation: PACU Anesthesia Type: General Level of consciousness: awake and alert and oriented Pain management: pain level controlled Vital Signs Assessment: post-procedure vital signs reviewed and stable Respiratory status: spontaneous breathing Cardiovascular status: stable Postop Assessment: no apparent nausea or vomiting Anesthetic complications: no     Last Vitals:  Vitals:   03/26/18 1315 03/26/18 1330  BP: 113/81 103/66  Pulse: 85 100  Resp: 11 17  Temp:    SpO2: 98% 98%    Last Pain:  Vitals:   03/26/18 1330  TempSrc:   PainSc: 7                  ADAMS, AMY A

## 2018-03-26 NOTE — Discharge Instructions (Signed)
Laparoscopic Cholecystectomy, Care After °This sheet gives you information about how to care for yourself after your procedure. Your health care provider may also give you more specific instructions. If you have problems or questions, contact your health care provider. °What can I expect after the procedure? °After the procedure, it is common to have: °· Pain at your incision sites. You will be given medicines to control this pain. °· Mild nausea or vomiting. °· Bloating and possible shoulder pain from the air-like gas that was used during the procedure. ° °Follow these instructions at home: °Incision care ° °· Follow instructions from your health care provider about how to take care of your incisions. Make sure you: °? Wash your hands with soap and water before you change your bandage (dressing). If soap and water are not available, use hand sanitizer. °? Change your dressing as told by your health care provider. °? Leave stitches (sutures), skin glue, or adhesive strips in place. These skin closures may need to be in place for 2 weeks or longer. If adhesive strip edges start to loosen and curl up, you may trim the loose edges. Do not remove adhesive strips completely unless your health care provider tells you to do that. °· Do not take baths, swim, or use a hot tub until your health care provider approves. Ask your health care provider if you can take showers. You may only be allowed to take sponge baths for bathing. °· Check your incision area every day for signs of infection. Check for: °? More redness, swelling, or pain. °? More fluid or blood. °? Warmth. °? Pus or a bad smell. °Activity °· Do not drive or use heavy machinery while taking prescription pain medicine. °· Do not lift anything that is heavier than 10 lb (4.5 kg) until your health care provider approves. °· Do not play contact sports until your health care provider approves. °· Do not drive for 24 hours if you were given a medicine to help you relax  (sedative). °· Rest as needed. Do not return to work or school until your health care provider approves. °General instructions °· Take over-the-counter and prescription medicines only as told by your health care provider. °· To prevent or treat constipation while you are taking prescription pain medicine, your health care provider may recommend that you: °? Drink enough fluid to keep your urine clear or pale yellow. °? Take over-the-counter or prescription medicines. °? Eat foods that are high in fiber, such as fresh fruits and vegetables, whole grains, and beans. °? Limit foods that are high in fat and processed sugars, such as fried and sweet foods. °Contact a health care provider if: °· You develop a rash. °· You have more redness, swelling, or pain around your incisions. °· You have more fluid or blood coming from your incisions. °· Your incisions feel warm to the touch. °· You have pus or a bad smell coming from your incisions. °· You have a fever. °· One or more of your incisions breaks open. °Get help right away if: °· You have trouble breathing. °· You have chest pain. °· You have increasing pain in your shoulders. °· You faint or feel dizzy when you stand. °· You have severe pain in your abdomen. °· You have nausea or vomiting that lasts for more than one day. °· You have leg pain. °This information is not intended to replace advice given to you by your health care provider. Make sure you discuss any questions you   have with your health care provider. °Document Released: 10/27/2005 Document Revised: 05/17/2016 Document Reviewed: 04/14/2016 °Elsevier Interactive Patient Education © 2018 [employer] ° °

## 2018-03-26 NOTE — Anesthesia Procedure Notes (Signed)
Procedure Name: Intubation Date/Time: 03/26/2018 12:07 PM Performed by: Andree Elk, Amy A, CRNA Pre-anesthesia Checklist: Patient identified, Patient being monitored, Timeout performed, Emergency Drugs available and Suction available Patient Re-evaluated:Patient Re-evaluated prior to induction Oxygen Delivery Method: Circle system utilized Preoxygenation: Pre-oxygenation with 100% oxygen Induction Type: IV induction, Rapid sequence and Cricoid Pressure applied Laryngoscope Size: Mac and 3 Grade View: Grade I Tube type: Oral Tube size: 7.0 mm Number of attempts: 1 Airway Equipment and Method: Stylet Placement Confirmation: ETT inserted through vocal cords under direct vision,  positive ETCO2 and breath sounds checked- equal and bilateral Secured at: 21 cm Tube secured with: Tape Dental Injury: Teeth and Oropharynx as per pre-operative assessment

## 2018-03-26 NOTE — Interval H&P Note (Signed)
History and Physical Interval Note:  03/26/2018 11:47 AM  Hannah Lynch  has presented today for surgery, with the diagnosis of chronic cholecystitis  The various methods of treatment have been discussed with the patient and family. After consideration of risks, benefits and other options for treatment, the patient has consented to  Procedure(s): LAPAROSCOPIC CHOLECYSTECTOMY (N/A) as a surgical intervention .  The patient's history has been reviewed, patient examined, no change in status, stable for surgery.  I have reviewed the patient's chart and labs.  Questions were answered to the patient's satisfaction.     Franky Macho

## 2018-03-26 NOTE — Op Note (Signed)
Patient:  Hannah Lynch  DOB:  12/24/1970  MRN:  [id number]   Preop Diagnosis: Chronic cholecystitis  Postop Diagnosis: Same  Procedure: Laparoscopic cholecystectomy  Surgeon: Franky Macho, MD  Anes: General endotracheal  Indications: Patient is a 47 year old white female who presents with biliary colic secondary to chronic cholecystitis.  The risks and benefits of the procedure including bleeding, infection, hepatobiliary injury, and the possibility of an open procedure were fully explained to the patient, who gave informed consent.  Procedure note: The patient was placed in supine position.  After induction of general endotracheal anesthesia, the abdomen was prepped and draped using the usual sterile technique with DuraPrep.  Surgical site confirmation was performed.  The supraumbilical incision was made down to the fascia.  A Veress needle was introduced into the abdominal cavity and confirmation of placement was done using saline drop test.  The abdomen was then insufflated to 16 mmHg pressure.  An 11 mm trocar was introduced into the abdominal cavity under direct visualization without difficulty.  The patient was placed in reverse Trendelenburg position and an additional 11 mm trocar was placed in the epigastric region and 5 mm trochars were placed in the right upper quadrant and right flank regions.  The liver was inspected and noted to be within normal limits.  The gallbladder was retracted in a dynamic fashion in order to provide a critical view of the triangle of Calot.  The cystic duct was first identified.  Its juncture to the infundibulum was fully identified.  Endoclips were placed proximally and distally on the cystic duct, and the cystic duct was divided.  This was likewise done the cystic artery.  The gallbladder was freed away from the gallbladder fossa using Bovie electrocautery.  The gallbladder was delivered through the epigastric trocar site using an Endo Catch bag.  The  gallbladder fossa was inspected and no abnormal bleeding or bile leakage was noted.  Surgicel was placed in the gallbladder fossa.  All fluid and air were then evacuated from the abdominal cavity prior to the removal of the trochars.  All wounds were irrigated with normal saline.  All wounds were injected with 0.5% Sensorcaine.  The supraumbilical fascia was reapproximated using 0 Vicryl interrupted suture.  All skin incisions were closed using staples.  Betadine ointment and dry sterile dressings were applied.  All tape and needle counts were correct at the end of the procedure.  The patient was extubated in the operating room and transferred to PACU in stable condition.  Complications: None  EBL: Minimal  Specimen: Gallbladder

## 2018-03-26 NOTE — Transfer of Care (Signed)
Immediate Anesthesia Transfer of Care Note  Patient: Hannah Lynch  Procedure(s) Performed: LAPAROSCOPIC CHOLECYSTECTOMY (N/A )  Patient Location: PACU  Anesthesia Type:General  Level of Consciousness: awake, oriented and patient cooperative  Airway & Oxygen Therapy: Patient Spontanous Breathing and Patient connected to face mask oxygen  Post-op Assessment: Report given to RN and Post -op Vital signs reviewed and stable  Post vital signs: Reviewed and stable  Last Vitals:  Vitals Value Taken Time  BP 118/92 03/26/2018 12:49 PM  Temp    Pulse 91 03/26/2018 12:52 PM  Resp 14 03/26/2018 12:52 PM  SpO2 100 % 03/26/2018 12:52 PM  Vitals shown include unvalidated device data.  Last Pain:  Vitals:   03/26/18 1123  TempSrc: Oral  PainSc: 4       Patients Stated Pain Goal: 6 (03/26/18 1123)  Complications: No apparent anesthesia complications

## 2018-03-26 NOTE — Anesthesia Preprocedure Evaluation (Signed)
Anesthesia Evaluation  Patient identified by MRN, date of birth, ID band Patient awake    Reviewed: Allergy & Precautions, H&P , NPO status , Patient's Chart, lab work & pertinent test results  History of Anesthesia Complications (+) PONV, Family history of anesthesia reaction and history of anesthetic complications  Airway Mallampati: I  TM Distance: >3 FB Neck ROM: full    Dental no notable dental hx.    Pulmonary neg pulmonary ROS,    Pulmonary exam normal breath sounds clear to auscultation       Cardiovascular Exercise Tolerance: Good negative cardio ROS   Rhythm:regular Rate:Normal     Neuro/Psych  Headaches, PSYCHIATRIC DISORDERS Anxiety Depression negative neurological ROS  negative psych ROS   GI/Hepatic negative GI ROS, Neg liver ROS,   Endo/Other  negative endocrine ROSHypothyroidism   Renal/GU negative Renal ROS  negative genitourinary   Musculoskeletal  (+) Arthritis ,   Abdominal   Peds  Hematology negative hematology ROS (+)   Anesthesia Other Findings   Reproductive/Obstetrics negative OB ROS                             Anesthesia Physical Anesthesia Plan  ASA: II  Anesthesia Plan: General   Post-op Pain Management:    Induction:   PONV Risk Score and Plan: Ondansetron, Dexamethasone and Scopolamine patch - Pre-op  Airway Management Planned:   Additional Equipment:   Intra-op Plan:   Post-operative Plan:   Informed Consent: I have reviewed the patients History and Physical, chart, labs and discussed the procedure including the risks, benefits and alternatives for the proposed anesthesia with the patient or authorized representative who has indicated his/her understanding and acceptance.   Dental Advisory Given  Plan Discussed with: CRNA  Anesthesia Plan Comments:         Anesthesia Quick Evaluation

## 2018-03-29 ENCOUNTER — Encounter (HOSPITAL_COMMUNITY): Payer: Self-pay | Admitting: General Surgery

## 2018-04-01 ENCOUNTER — Ambulatory Visit (INDEPENDENT_AMBULATORY_CARE_PROVIDER_SITE_OTHER): Payer: Self-pay | Admitting: General Surgery

## 2018-04-01 ENCOUNTER — Encounter: Payer: Self-pay | Admitting: General Surgery

## 2018-04-01 VITALS — BP 140/87 | HR 82 | Temp 96.4°F | Resp 16 | Wt 207.0 lb

## 2018-04-01 DIAGNOSIS — Z09 Encounter for follow-up examination after completed treatment for conditions other than malignant neoplasm: Secondary | ICD-10-CM

## 2018-04-01 NOTE — Progress Notes (Signed)
Subjective:     Hannah Lynch  Status post laparoscopic cholecystectomy.  Pleased with results.  Preoperative symptoms have resolved.  She denies any incisional pain. Objective:    BP 140/87 (BP Location: Left Arm, Patient Position: Sitting, Cuff Size: Normal)   Pulse 82   Temp (!) 96.4 F (35.8 C) (Temporal)   Resp 16   Wt 207 lb (93.9 kg)   BMI 36.67 kg/m   General:  alert, cooperative and no distress  Abdomen soft, incisions healing well.  Staples removed, Steri-Strips applied. Final pathology consistent with diagnosis     Assessment:    Doing well postoperatively.    Plan:  May resume normal activity.  Follow-up as needed.

## 2018-04-04 ENCOUNTER — Emergency Department (HOSPITAL_COMMUNITY): Payer: BLUE CROSS/BLUE SHIELD

## 2018-04-04 ENCOUNTER — Other Ambulatory Visit: Payer: Self-pay

## 2018-04-04 ENCOUNTER — Encounter (HOSPITAL_COMMUNITY): Payer: Self-pay | Admitting: Emergency Medicine

## 2018-04-04 ENCOUNTER — Emergency Department (HOSPITAL_COMMUNITY)
Admission: EM | Admit: 2018-04-04 | Discharge: 2018-04-04 | Disposition: A | Discharge status: Home / Self Care | Payer: BLUE CROSS/BLUE SHIELD | Attending: Emergency Medicine | Admitting: Emergency Medicine

## 2018-04-04 DIAGNOSIS — T8140XA Infection following a procedure, unspecified, initial encounter: Secondary | ICD-10-CM | POA: Diagnosis not present

## 2018-04-04 DIAGNOSIS — L02412 Cutaneous abscess of left axilla: Secondary | ICD-10-CM | POA: Diagnosis not present

## 2018-04-04 DIAGNOSIS — E039 Hypothyroidism, unspecified: Secondary | ICD-10-CM | POA: Insufficient documentation

## 2018-04-04 DIAGNOSIS — Z79899 Other long term (current) drug therapy: Secondary | ICD-10-CM | POA: Diagnosis not present

## 2018-04-04 DIAGNOSIS — Y658 Other specified misadventures during surgical and medical care: Secondary | ICD-10-CM | POA: Insufficient documentation

## 2018-04-04 DIAGNOSIS — T8149XA Infection following a procedure, other surgical site, initial encounter: Secondary | ICD-10-CM

## 2018-04-04 LAB — CBC WITH DIFFERENTIAL/PLATELET
Basophils Absolute: 0 10*3/uL (ref 0.0–0.1)
Basophils Relative: 0 %
Eosinophils Absolute: 0.2 10*3/uL (ref 0.0–0.7)
Eosinophils Relative: 2 %
HCT: 38 % (ref 36.0–46.0)
Hemoglobin: 12.9 g/dL (ref 12.0–15.0)
Lymphocytes Relative: 25 %
Lymphs Abs: 2.7 10*3/uL (ref 0.7–4.0)
MCH: 32.6 pg (ref 26.0–34.0)
MCHC: 33.9 g/dL (ref 30.0–36.0)
MCV: 96 fL (ref 78.0–100.0)
Monocytes Absolute: 0.8 10*3/uL (ref 0.1–1.0)
Monocytes Relative: 7 %
Neutro Abs: 7.1 10*3/uL (ref 1.7–7.7)
Neutrophils Relative %: 66 %
Platelets: 269 10*3/uL (ref 150–400)
RBC: 3.96 MIL/uL (ref 3.87–5.11)
RDW: 12.5 % (ref 11.5–15.5)
WBC: 10.8 10*3/uL — ABNORMAL HIGH (ref 4.0–10.5)

## 2018-04-04 LAB — BASIC METABOLIC PANEL
Anion gap: 7 (ref 5–15)
BUN: 11 mg/dL (ref 6–20)
CO2: 28 mmol/L (ref 22–32)
Calcium: 9.3 mg/dL (ref 8.9–10.3)
Chloride: 101 mmol/L (ref 101–111)
Creatinine, Ser: 0.79 mg/dL (ref 0.44–1.00)
GFR calc Af Amer: >60 mL/min (ref >60)
GFR calc non Af Amer: >60 mL/min (ref >60)
Glucose, Bld: 107 mg/dL — ABNORMAL HIGH (ref 65–99)
Potassium: 3.7 mmol/L (ref 3.5–5.1)
Sodium: 136 mmol/L (ref 135–145)

## 2018-04-04 MED ORDER — SODIUM CHLORIDE 0.9 % IV BOLUS
1000.0000 mL | Freq: Once | INTRAVENOUS | Status: AC
  Administered 2018-04-04: 1000 mL via INTRAVENOUS

## 2018-04-04 MED ORDER — TRAMADOL HCL 50 MG PO TABS: 100.0000 mg | ORAL_TABLET | Freq: Four times a day (QID) | ORAL | 0 refills | Status: DC | PRN

## 2018-04-04 MED ORDER — KETOROLAC TROMETHAMINE 30 MG/ML IJ SOLN
30.0000 mg | Freq: Once | INTRAMUSCULAR | Status: AC
  Administered 2018-04-04: 30 mg via INTRAVENOUS
  Filled 2018-04-04: qty 1

## 2018-04-04 MED ORDER — VANCOMYCIN HCL IN DEXTROSE 1-5 GM/200ML-% IV SOLN
1000.0000 mg | Freq: Once | INTRAVENOUS | Status: AC
  Administered 2018-04-04: 1000 mg via INTRAVENOUS
  Filled 2018-04-04: qty 200

## 2018-04-04 MED ORDER — LIDOCAINE-EPINEPHRINE (PF) 2 %-1:200000 IJ SOLN
10.0000 mL | Freq: Once | INTRAMUSCULAR | Status: AC
  Administered 2018-04-04: 10 mL
  Filled 2018-04-04: qty 20

## 2018-04-04 MED ORDER — LIDOCAINE HCL (PF) 2 % IJ SOLN
INTRAMUSCULAR | Status: AC
  Filled 2018-04-04: qty 10

## 2018-04-04 MED ORDER — IOPAMIDOL (ISOVUE-300) INJECTION 61%
100.0000 mL | Freq: Once | INTRAVENOUS | Status: AC | PRN
  Administered 2018-04-04: 100 mL via INTRAVENOUS

## 2018-04-04 MED ORDER — LIDOCAINE-EPINEPHRINE (PF) 2 %-1:200000 IJ SOLN
INTRAMUSCULAR | Status: AC
  Filled 2018-04-04: qty 20

## 2018-04-04 MED ORDER — POVIDONE-IODINE 10 % EX SOLN
CUTANEOUS | Status: AC
  Administered 2018-04-04: 1
  Filled 2018-04-04: qty 15

## 2018-04-04 MED ORDER — DOXYCYCLINE HYCLATE 100 MG PO CAPS: 100.0000 mg | ORAL_CAPSULE | Freq: Two times a day (BID) | ORAL | 0 refills | Status: DC

## 2018-04-04 NOTE — ED Notes (Signed)
ED Provider at bedside. 

## 2018-04-04 NOTE — Discharge Instructions (Addendum)
Clean the wounds daily with soap and water. Bar Dial soap would be best. Take the antibiotic until gone. Take ibuprofen 600 mg + acetaminophen 650 mg every 6 hrs for pain with the tramadol if you have run out of your hydrocodone. Remove the packing in 2 days. Dr Lovell Sheehan wants to recheck you in the office this week, either Tuesday, May 28 or Thursday, May 30. Return to the ED if you get a fever, have uncontrolled vomiting or you seem to be getting worse instead of better.

## 2018-04-04 NOTE — ED Triage Notes (Addendum)
Pt reports having gall bladder surgery 03/26/18 by Dr. Lovell Sheehan, staples were removed and steri-strips placed on Thursday and pt reports some redness and soreness since, today all 4 incisions are red, warm to the touch with drainage and foul odor, pt also reports abscess under in armpit

## 2018-04-04 NOTE — ED Provider Notes (Signed)
[facility] EMERGENCY DEPARTMENT Provider Note   CSN: [id number] Arrival date & time: 04/04/18  0003  Time seen 02:15 AM   History   Chief Complaint Chief Complaint  Patient presents with  . Wound Infection    HPI GENESIA CASLIN is a 47 y.o. female.  HPI patient states she had laparoscopic cholecystectomy done on May 17 by Dr. Lovell Sheehan.  She was seen in the office on the 23rd and had her staples removed.  She states there was some redness then that he thought was due to irritation from the staples.  However she reports since then she has had increasing redness and the 2 most medial wounds have been draining pus.  She also states it started getting more painful today.  She denies fever or chills.  She started getting nausea in the ED without vomiting.  She also complains of some painful swelling in her left axilla.  She denies any prior history of boils or abscesses.  She states she is never had MRSA.  PCP Joette Catching, MD   Past Medical History:  Diagnosis Date  . Anxiety   . Degenerative disc disease, lumbar   . Depression   . Family history of adverse reaction to anesthesia    pt's mother has hx. of post-op N/V  . Headache   . Hypothyroidism   . Nasal septal deviation 11/2017  . Nasal turbinate hypertrophy 11/2017  . PONV (postoperative nausea and vomiting)   . Snoring 11/2017    Patient Active Problem List   Diagnosis Date Noted  . Chronic cholecystitis   . Primary insomnia 06/25/2017  . Papanicolaou smear for cervical cancer screening 06/25/2017  . Bilateral hearing loss 06/25/2017  . Acute serous otitis media 06/25/2017  . Lumbar stenosis with neurogenic claudication 09/25/2014    Past Surgical History:  Procedure Laterality Date  . ABDOMINAL HYSTERECTOMY  2005   partial  . ANTERIOR CERVICAL DECOMP/DISCECTOMY FUSION  03/25/2007   C4-5  . CERVICAL FUSION  2013  . CHOLECYSTECTOMY N/A 03/26/2018   Procedure: LAPAROSCOPIC CHOLECYSTECTOMY;  Surgeon: Franky Macho, MD;  Location: [facility];  Service: General;  Laterality: N/A;  . KNEE ARTHROSCOPY Right   . LUMBAR LAMINECTOMY/DECOMPRESSION MICRODISCECTOMY  11/21/2004   L4-5  . MAXIMUM ACCESS (MAS)POSTERIOR LUMBAR INTERBODY FUSION (PLIF) 1 LEVEL N/A 09/25/2014   Procedure: Lumbar four-five MAXIMUM ACCESS (MAS) POSTERIOR LUMBAR INTERBODY FUSION (PLIF) 1 LEVEL;  Surgeon: Maeola Harman, MD;  Location: [facility];  Service: Neurosurgery;  Laterality: N/A;  . NASAL SEPTOPLASTY W/ TURBINOPLASTY N/A 12/07/2017   Procedure: NASAL SEPTOPLASTY WITH BILATERAL TURBINATE REDUCTION;  Surgeon: Christia Reading, MD;  Location: [facility];  Service: ENT;  Laterality: N/A;  . ORIF ANKLE FRACTURE Right   . UPPER GI ENDOSCOPY       OB History   None      Home Medications    Prior to Admission medications   Medication Sig Start Date End Date Taking? Authorizing Provider  citalopram (CELEXA) 20 MG tablet Take 20 mg by mouth daily.    [provider]  Cyanocobalamin (VITAMIN B 12 PO) Take by mouth.    [provider]  doxepin (SINEQUAN) 25 MG capsule Take 1 capsule by mouth at bedtime. 03/10/18 03/10/19  [provider]  doxycycline (VIBRAMYCIN) 100 MG capsule Take 1 capsule (100 mg total) by mouth 2 (two) times daily. 04/04/18   Devoria Albe, MD  gabapentin (NEURONTIN) 300 MG capsule Take 300 mg by mouth 3 (three)  times daily.    [provider]  HYDROcodone-acetaminophen (NORCO) 5-325 MG tablet Take 1 tablet by mouth every 4 (four) hours as needed for moderate pain. 03/26/18   Franky Macho, MD  levothyroxine (SYNTHROID, LEVOTHROID) 25 MCG tablet Take 25 mcg by mouth daily.  01/23/17 03/26/18  [provider]  LORazepam (ATIVAN) 0.5 MG tablet Take 1 tablet by mouth as needed. 07/09/17   [provider]  naproxen (NAPROSYN) 500 MG tablet Take 1 tablet (500 mg total) by mouth 2 (two) times daily. Patient not taking: Reported on 03/14/2018 02/01/18   Glynn Octave, MD  ondansetron (ZOFRAN ODT) 4 MG disintegrating tablet  ODT q4 hours prn nausea/vomit 03/14/18   Bethann Berkshire, MD  SUMAtriptan (IMITREX) 50 MG tablet Take 1 tablet by mouth as needed. 10/14/17   [provider]  terbinafine (LAMISIL) 250 MG tablet Take 250 mg by mouth daily.    [provider]  zolpidem (AMBIEN) 10 MG tablet Take 1 tablet by mouth as needed. 10/09/17   [provider]    Family History Family History  Problem Relation Age of Onset  . Hypertension Mother   . Diabetes Mother   . Heart failure Father   . Hypertension Father   . Diabetes Father   . Heart failure Other     Social History Social History   Tobacco Use  . Smoking status: Never Smoker  . Smokeless tobacco: Never Used  Substance Use Topics  . Alcohol use: No    Frequency: Never  . Drug use: No     Allergies   Patient has no known allergies.   Review of Systems Review of Systems  All other systems reviewed and are negative.    Physical Exam Updated Vital Signs BP (!) 121/91 (BP Location: Left Arm)   Pulse 83   Temp 97.8 F (36.6 C) (Oral)   Resp 18   Ht  (1.6 m)   Wt 93.9 kg (207 lb)   SpO2 97%   BMI 36.67 kg/m   Vital signs normal    Physical Exam  Constitutional: She is oriented to person, place, and time. She appears well-developed and well-nourished.  HENT:  Head: Normocephalic and atraumatic.  Right Ear: External ear normal.  Left Ear: External ear normal.  Nose: Nose normal.  Eyes: Conjunctivae and EOM are normal.  Neck: Normal range of motion.  Cardiovascular: Normal rate.  Pulmonary/Chest: Effort normal. No respiratory distress.  Abdominal: Soft. There is tenderness.  The patient's for laparoscopic surgical sites are intensely inflamed and reddened with purulent pockets around the staple holes.  When I palpate the areas there is a area of induration in each one.  Musculoskeletal: Normal range of motion.  Neurological: She  is alert and oriented to person, place, and time. No cranial nerve deficit.  Skin: Capillary refill takes less than 2 seconds. There is erythema.  Pt has an abscess that is deep in the subcutaneous tissues in her left axilla  Psychiatric: She has a normal mood and affect. Her behavior is normal. Thought content normal.  Nursing note and vitals reviewed.      ED Treatments / Results  Labs (all labs ordered are listed, but only abnormal results are displayed) Results for orders placed or performed during the hospital encounter of 04/04/18  Culture, blood (Routine X 2) w Reflex to ID Panel  Result Value Ref Range   Specimen Description RIGHT ANTECUBITAL    Special Requests  BOTTLES DRAWN AEROBIC AND ANAEROBIC Blood Culture adequate volume Performed at [facility], [address], [place], [place] [address]    Culture PENDING    Report Status PENDING   Culture, blood (Routine X 2) w Reflex to ID Panel  Result Value Ref Range   Specimen Description LEFT ANTECUBITAL    Special Requests      BOTTLES DRAWN AEROBIC AND ANAEROBIC Blood Culture adequate volume Performed at [facility], [address], [place], [place] [address]    Culture PENDING    Report Status PENDING   CBC with Differential  Result Value Ref Range   WBC 10.8 (H) 4.0 - 10.5 K/uL   RBC 3.96 3.87 - 5.11 MIL/uL   Hemoglobin 12.9 12.0 - 15.0 g/dL   HCT 09.8 11.9 - 14.7 %   MCV 96.0 78.0 - 100.0 fL   MCH 32.6 26.0 - 34.0 pg   MCHC 33.9 30.0 - 36.0 g/dL   RDW 82.9 56.2 - 13.0 %   Platelets 269 150 - 400 K/uL   Neutrophils Relative % 66 %   Neutro Abs 7.1 1.7 - 7.7 K/uL   Lymphocytes Relative 25 %   Lymphs Abs 2.7 0.7 - 4.0 K/uL   Monocytes Relative 7 %   Monocytes Absolute 0.8 0.1 - 1.0 K/uL   Eosinophils Relative 2 %   Eosinophils Absolute 0.2 0.0 - 0.7 K/uL   Basophils Relative 0 %   Basophils Absolute 0.0 0.0 - 0.1 K/uL  Basic metabolic panel  Result Value Ref Range   Sodium 136 135 - 145 mmol/L    Potassium 3.7 3.5 - 5.1 mmol/L   Chloride 101 101 - 111 mmol/L   CO2 28 22 - 32 mmol/L   Glucose, Bld 107 (H) 65 - 99 mg/dL   BUN 11 6 - 20 mg/dL   Creatinine, Ser 8.65 0.44 - 1.00 mg/dL   Calcium 9.3 8.9 - 78.4 mg/dL   GFR calc non Af Amer >60 >60 mL/min   GFR calc Af Amer >60 >60 mL/min   Anion gap 7 5 - 15   Laboratory interpretation all normal except mild leukocytosis    EKG None  Radiology Ct Abdomen Pelvis W Contrast  Result Date: 04/04/2018 CLINICAL DATA:  Wound infection following gallbladder surgery. EXAM: CT ABDOMEN AND PELVIS WITH CONTRAST TECHNIQUE: Multidetector CT imaging of the abdomen and pelvis was performed using the standard protocol following bolus administration of intravenous contrast. CONTRAST:  ISOVUE-300 IOPAMIDOL (ISOVUE-300) INJECTION 61% COMPARISON:  CT abdomen pelvis 01/31/2018 FINDINGS: LOWER CHEST: Trace pericardial fluid.  Lung bases are clear. HEPATOBILIARY: Normal hepatic contours and density. No intra- or extrahepatic biliary dilatation. Status post cholecystectomy. There is a 1.5 x 5.0 cm fluid collection at the gallbladder fossa. PANCREAS: Normal parenchymal contours without ductal dilatation. No peripancreatic fluid collection. SPLEEN: Normal. ADRENALS/URINARY TRACT: --Adrenal glands: Normal. --Right kidney/ureter: No hydronephrosis, nephroureterolithiasis, perinephric stranding or solid renal mass. --Left kidney/ureter: No hydronephrosis, nephroureterolithiasis, perinephric stranding or solid renal mass. --Urinary bladder: Normal for degree of distention STOMACH/BOWEL: --Stomach/Duodenum: No hiatal hernia or other gastric abnormality. Normal duodenal course. --Small bowel: No dilatation or inflammation. --Colon: No focal abnormality. --Appendix: Normal. VASCULAR/LYMPHATIC: Normal course and caliber of the major abdominal vessels. No abdominal or pelvic lymphadenopathy. REPRODUCTIVE: Status post hysterectomy. No adnexal mass. MUSCULOSKELETAL. L4-L5  PLIF. OTHER: There is inflammatory change without fluid collection at the midline and right upper quadrant surgical port sites. IMPRESSION: 1. Small amount of fluid at the gallbladder fossa, probably postoperative. No  defined fluid collection. 2. Inflammatory change within the subcutaneous fat at the anterior midline and right upper quadrant surgical port sites. No associated fluid collection. Electronically Signed   By: Deatra Robinson M.D.   On: 04/04/2018 03:34    Procedures .Marland KitchenIncision and Drainage Date/Time: 04/04/2018 8:09 AM Performed by: Devoria Albe, MD Authorized by: Devoria Albe, MD   Consent:    Consent obtained:  Verbal   Consent given by:  Patient   Risks discussed:  Bleeding Location:    Type:  Abscess   Location:  Upper extremity (Left axilla) Pre-procedure details:    Skin preparation:  Betadine Anesthesia (see MAR for exact dosages):    Anesthesia method:  Local infiltration   Local anesthetic:  Lidocaine 2% WITH epi Procedure type:    Complexity:  Simple Procedure details:    Incision types:  Single straight   Scalpel blade:  11   Wound management:  Probed and deloculated   Drainage:  Bloody   Drainage amount:  Scant   Packing materials:  1/4 in gauze Post-procedure details:    Patient tolerance of procedure:  Tolerated well, no immediate complications   (including critical care time)  Medications Ordered in ED Medications  sodium chloride 0.9 % bolus 1,000 mL (0 mLs Intravenous Stopped 04/04/18 0508)  ketorolac (TORADOL) 30 MG/ML injection 30 mg (30 mg Intravenous Given 04/04/18 0239)  vancomycin (VANCOCIN) IVPB 1000 mg/200 mL premix (0 mg Intravenous Stopped 04/04/18 0401)  iopamidol (ISOVUE-300) 61 % injection 100 mL (100 mLs Intravenous Contrast Given 04/04/18 0320)  lidocaine-EPINEPHrine (XYLOCAINE W/EPI) 2 %-1:200000 (PF) injection 10 mL (10 mLs Infiltration Given 04/04/18 0508)  povidone-iodine (BETADINE) 10 % external solution (1 application  Given by Other  04/04/18 1610)     Initial Impression / Assessment and Plan / ED Course  I have reviewed the triage vital signs and the nursing notes.  Pertinent labs & imaging results that were available during my care of the patient were reviewed by me and considered in my medical decision making (see chart for details).     Patient was given IV fluids and IV Toradol for pain.  She was started on vancomycin for suspected MRSA infection.  CT of the abdomen was done to make sure of this infection is superficial and hopefully not intraperitoneal.  4:30 AM patient was given the results of her CT scan.  I discussed I would talk to Dr. Lovell Sheehan and see what he wants to do.  Dr. Lovell Sheehan, 4:30 AM, wants her to take oral doxycycline for 10 days and he will see in the office this week.  Review of the [facility] shows patient got #20 hydrocodone 5/325 on January 28, #10 on April 16, #20 on May 6, and #40 on May 17.  These were prescribed by 4 different providers.  Final Clinical Impressions(s) / ED Diagnoses   Final diagnoses:  Wound infection after surgery  Abscess of left axilla    ED Discharge Orders        Ordered    doxycycline (VIBRAMYCIN) 100 MG capsule  2 times daily     04/04/18 0510    OTC ibuprofen and acetaminophen   Plan discharge  Devoria Albe, MD, Concha Pyo, MD 04/04/18 [phone]

## 2018-04-04 NOTE — ED Notes (Signed)
Dressing applied to under left arm

## 2018-04-06 ENCOUNTER — Encounter: Payer: Self-pay | Admitting: General Surgery

## 2018-04-06 ENCOUNTER — Ambulatory Visit (INDEPENDENT_AMBULATORY_CARE_PROVIDER_SITE_OTHER): Payer: Self-pay | Admitting: General Surgery

## 2018-04-06 VITALS — BP 125/82 | HR 88 | Temp 97.8°F | Resp 18 | Wt 207.0 lb

## 2018-04-06 DIAGNOSIS — Z09 Encounter for follow-up examination after completed treatment for conditions other than malignant neoplasm: Secondary | ICD-10-CM

## 2018-04-06 LAB — AEROBIC CULTURE W GRAM STAIN (SUPERFICIAL SPECIMEN): Special Requests: NORMAL

## 2018-04-06 LAB — AEROBIC CULTURE  (SUPERFICIAL SPECIMEN)

## 2018-04-06 NOTE — Progress Notes (Signed)
Subjective:     Hannah Lynch  Patient developed whelps around her 4 incision sites 2 days ago.  She was seen in the emergency room and was started on an antibiotic.  She states that she was working in [employer] and the incisions all began to to swell and become red.  Today, the redness seems to have quelled down. Objective:    BP 125/82 (BP Location: Right Arm, Patient Position: Sitting, Cuff Size: Normal)   Pulse 88   Temp 97.8 F (36.6 C) (Temporal)   Resp 18   Wt 207 lb (93.9 kg)   BMI 36.67 kg/m   General:  alert, cooperative and no distress  Abdomen soft.  All 4 trocar incision sites are erythematous with resolved blistering present.  No induration is noted.  Minimal drainage from the epigastric trocar site is noted.     Assessment:    Rash to skin incisions.  Question whether related to the Steri-Strips that were placed just before she went back to work.    Plan:  Keep wounds clean and dry.  Finish antibiotic course.  Triple antibiotic ointment given for topical treatment.  Follow-up here in 1 week.

## 2018-04-07 ENCOUNTER — Telehealth: Payer: Self-pay | Admitting: Emergency Medicine

## 2018-04-07 NOTE — Telephone Encounter (Signed)
Post ED Visit - Positive Culture Follow-up  Culture report reviewed by antimicrobial stewardship pharmacist:   Enzo Bi, Pharm.D.  Celedonio Miyamoto, Pharm.D., BCPS AQ-ID  Garvin Fila, Pharm.D., BCPS  Georgina Pillion, Pharm.D., BCPS  [place], [place].D., BCPS, AAHIVP  Estella Husk, Pharm.D., BCPS, AAHIVP  Lysle Pearl, PharmD, BCPS  Sherlynn Carbon, PharmD  Pollyann Samples, PharmD, BCPS  Positive wound culture Treated with doxycycline, organism sensitive to the same and no further patient follow-up is required at this time.  Berle Mull 04/07/2018, 1:08 PM

## 2018-04-08 ENCOUNTER — Ambulatory Visit (INDEPENDENT_AMBULATORY_CARE_PROVIDER_SITE_OTHER): Payer: BLUE CROSS/BLUE SHIELD | Admitting: Psychology

## 2018-04-08 DIAGNOSIS — F411 Generalized anxiety disorder: Secondary | ICD-10-CM | POA: Diagnosis not present

## 2018-04-09 LAB — CULTURE, BLOOD (ROUTINE X 2)
Culture: NO GROWTH
Culture: NO GROWTH
Special Requests: ADEQUATE
Special Requests: ADEQUATE

## 2018-04-13 ENCOUNTER — Encounter: Payer: Self-pay | Admitting: General Surgery

## 2018-04-13 ENCOUNTER — Ambulatory Visit (INDEPENDENT_AMBULATORY_CARE_PROVIDER_SITE_OTHER): Payer: Self-pay | Admitting: General Surgery

## 2018-04-13 VITALS — BP 124/81 | HR 85 | Temp 97.1°F | Resp 18 | Wt 209.0 lb

## 2018-04-13 DIAGNOSIS — Z09 Encounter for follow-up examination after completed treatment for conditions other than malignant neoplasm: Secondary | ICD-10-CM

## 2018-04-13 NOTE — Progress Notes (Signed)
Subjective:     Hannah MckusickLisa O Lynch  Here for follow-up wound check.  Patient states the redness has much improved.  Has had no purulent drainage. Objective:    BP 124/81 (BP Location: Left Arm, Patient Position: Sitting, Cuff Size: Normal)   Pulse 85   Temp (!) 97.1 F (36.2 C) (Temporal)   Resp 18   Wt 209 lb (94.8 kg)   BMI 37.02 kg/m   General:  alert, cooperative and no distress  Abdomen soft, incisions are healed.  Less erythema noted.  No induration or purulent drainage.     Assessment:    Incisional irritation resolving.  No evidence of infection.    Plan:   Return if the wounds start looking worse again.  Patient pleased with results.  Follow-up as needed.

## 2018-04-16 ENCOUNTER — Ambulatory Visit: Payer: BLUE CROSS/BLUE SHIELD | Admitting: Psychology

## 2018-04-29 ENCOUNTER — Ambulatory Visit: Payer: BLUE CROSS/BLUE SHIELD | Admitting: Psychology

## 2018-05-06 DIAGNOSIS — J351 Hypertrophy of tonsils: Secondary | ICD-10-CM

## 2018-05-06 HISTORY — DX: Hypertrophy of tonsils: J35.1

## 2018-05-12 ENCOUNTER — Encounter (INDEPENDENT_AMBULATORY_CARE_PROVIDER_SITE_OTHER): Payer: Self-pay | Admitting: Orthopaedic Surgery

## 2018-05-12 ENCOUNTER — Ambulatory Visit (INDEPENDENT_AMBULATORY_CARE_PROVIDER_SITE_OTHER): Payer: Self-pay

## 2018-05-12 ENCOUNTER — Ambulatory Visit (INDEPENDENT_AMBULATORY_CARE_PROVIDER_SITE_OTHER): Payer: BLUE CROSS/BLUE SHIELD | Admitting: Orthopaedic Surgery

## 2018-05-12 VITALS — BP 114/78 | HR 82 | Ht 64.0 in | Wt 210.0 lb

## 2018-05-12 DIAGNOSIS — M25562 Pain in left knee: Secondary | ICD-10-CM | POA: Diagnosis not present

## 2018-05-12 MED ORDER — METHYLPREDNISOLONE ACETATE 40 MG/ML IJ SUSP
80.0000 mg | INTRAMUSCULAR | Status: AC | PRN
  Administered 2018-05-12: 80 mg

## 2018-05-12 MED ORDER — LIDOCAINE HCL 1 % IJ SOLN
2.0000 mL | INTRAMUSCULAR | Status: AC | PRN
  Administered 2018-05-12: 2 mL

## 2018-05-12 MED ORDER — BUPIVACAINE HCL 0.5 % IJ SOLN
2.0000 mL | INTRAMUSCULAR | Status: AC | PRN
  Administered 2018-05-12: 2 mL via INTRA_ARTICULAR

## 2018-05-12 NOTE — Progress Notes (Signed)
Office Visit Note   Patient: Hannah Lynch           Date of Birth: 09-29-1971           MRN: [id number] Visit Date: 05/12/2018              Requested by: Joette Catching, MD [address] [place], [place] [address] PCP: Joette Catching, MD   Assessment & Plan: Visit Diagnoses:  1. Acute pain of left knee     Plan: Pain left knee with onset about 3 to 4 weeks ago.  X-rays consistent with osteoarthritis.  Will inject knee with cortisone and monitor response  Follow-Up Instructions: Return if symptoms worsen or fail to improve.   Orders:  Orders Placed This Encounter  Procedures  . Large Joint Inj: L knee  . XR KNEE 3 VIEW LEFT   No orders of the defined types were placed in this encounter.     Procedures: Large Joint Inj: L knee on 05/12/2018 2:13 PM Indications: pain and diagnostic evaluation Details: 25 G 1.5 in needle, anteromedial approach  Arthrogram: No  Medications: 2 mL lidocaine 1 %; 2 mL bupivacaine 0.5 %; 80 mg methylPREDNISolone acetate 40 MG/ML Procedure, treatment alternatives, risks and benefits explained, specific risks discussed. Consent was given by the patient. Patient was prepped and draped in the usual sterile fashion.       Clinical Data: No additional findings.   Subjective: Chief Complaint  Patient presents with  . New Patient (Initial Visit)    L KNEE PAIN FOR 3-4 WKS NO INJURY OR INJECTIONS  Insidious onset of left knee pain approximately 3 to 4 weeks ago without injury or trauma occasion Hannah Lynch has a sensation of her knee "giving out on ".  "It just plain hurts".  Some swelling.  Prior history of right knee arthroscopy with evidence of osteoarthritis  HPI  Review of Systems  Constitutional: Negative for fatigue and fever.  HENT: Negative for ear pain.   Eyes: Negative for pain.  Respiratory: Negative for cough and shortness of breath.   Cardiovascular: Negative for leg swelling.  Gastrointestinal: Negative for constipation and  diarrhea.  Genitourinary: Negative for difficulty urinating.  Musculoskeletal: Negative for back pain and neck pain.  Skin: Negative for rash.  Allergic/Immunologic: Negative for food allergies.  Neurological: Positive for weakness. Negative for numbness.  Hematological: Bruises/bleeds easily.  Psychiatric/Behavioral: Positive for sleep disturbance.     Objective: Vital Signs: BP 114/78 (BP Location: Right Arm, Patient Position: Sitting, Cuff Size: Normal)   Pulse 82   Ht 5\' 4"  (1.626 m)   Wt 210 lb (95.3 kg)   BMI 36.05 kg/m   Physical Exam  Constitutional: She is oriented to person, place, and time. She appears well-developed and well-nourished.  HENT:  Mouth/Throat: Oropharynx is clear and moist.  Eyes: Pupils are equal, round, and reactive to light. EOM are normal.  Pulmonary/Chest: Effort normal.  Neurological: She is alert and oriented to person, place, and time.  Skin: Skin is warm and dry.  Psychiatric: She has a normal mood and affect. Her behavior is normal.    Ortho Exam awake alert and oriented x3.  Comfortable sitting.  No effusion left knee.  Predominant medial joint pain anteriorly.  Some mild patellar crepitation.  No crepitation medially.  No pain laterally.  No popliteal pain no calf pain.  Neurovascular exam intact.  Full range of motion  Specialty Comments:  No specialty comments available.  Imaging: Xr Knee 3 View Left  Result Date: 05/12/2018 Films of the left knee were obtained in several projections standing.  There is decrease in the medial joint space consistent with osteoarthritis.  There is some subchondral sclerosis in the tibia more than the femur medially.  Some patellofemoral arthritis as well.  Films are consistent with moderate osteoarthritis predominantly in the medial and patellofemoral compartments    PMFS History: Patient Active Problem List   Diagnosis Date Noted  . Chronic cholecystitis   . Primary insomnia 06/25/2017  .  Papanicolaou smear for cervical cancer screening 06/25/2017  . Bilateral hearing loss 06/25/2017  . Acute serous otitis media 06/25/2017  . Lumbar stenosis with neurogenic claudication 09/25/2014   Past Medical History:  Diagnosis Date  . Anxiety   . Degenerative disc disease, lumbar   . Depression   . Family history of adverse reaction to anesthesia    pt's mother has hx. of post-op N/V  . Headache   . Hypothyroidism   . Nasal septal deviation 11/2017  . Nasal turbinate hypertrophy 11/2017  . PONV (postoperative nausea and vomiting)   . Snoring 11/2017    Family History  Problem Relation Age of Onset  . Hypertension Mother   . Diabetes Mother   . Heart failure Father   . Hypertension Father   . Diabetes Father   . Heart failure Other     Past Surgical History:  Procedure Laterality Date  . ABDOMINAL HYSTERECTOMY  2005   partial  . ANTERIOR CERVICAL DECOMP/DISCECTOMY FUSION  03/25/2007   C4-5  . CERVICAL FUSION  2013  . CHOLECYSTECTOMY N/A 03/26/2018   Procedure: LAPAROSCOPIC CHOLECYSTECTOMY;  Surgeon: Franky MachoJenkins, Mark, MD;  Location: [facility];  Service: General;  Laterality: N/A;  . KNEE ARTHROSCOPY Right   . LUMBAR LAMINECTOMY/DECOMPRESSION MICRODISCECTOMY  11/21/2004   L4-5  . MAXIMUM ACCESS (MAS)POSTERIOR LUMBAR INTERBODY FUSION (PLIF) 1 LEVEL N/A 09/25/2014   Procedure: Lumbar four-five MAXIMUM ACCESS (MAS) POSTERIOR LUMBAR INTERBODY FUSION (PLIF) 1 LEVEL;  Surgeon: Maeola HarmanJoseph Stern, MD;  Location: [facility];  Service: Neurosurgery;  Laterality: N/A;  . NASAL SEPTOPLASTY W/ TURBINOPLASTY N/A 12/07/2017   Procedure: NASAL SEPTOPLASTY WITH BILATERAL TURBINATE REDUCTION;  Surgeon: Christia ReadingBates, Dwight, MD;  Location: [facility];  Service: ENT;  Laterality: N/A;  . ORIF ANKLE FRACTURE Right   . UPPER GI ENDOSCOPY     Social History   Occupational History  . Not on file  Tobacco Use  . Smoking status: Never Smoker  . Smokeless tobacco: Never Used  Substance and  Sexual Activity  . Alcohol use: No    Frequency: Never  . Drug use: No  . Sexual activity: Yes    Birth control/protection: None

## 2018-06-02 ENCOUNTER — Other Ambulatory Visit (INDEPENDENT_AMBULATORY_CARE_PROVIDER_SITE_OTHER): Payer: Self-pay | Admitting: Orthopaedic Surgery

## 2018-06-02 ENCOUNTER — Encounter (INDEPENDENT_AMBULATORY_CARE_PROVIDER_SITE_OTHER): Payer: Self-pay | Admitting: Orthopaedic Surgery

## 2018-06-02 ENCOUNTER — Ambulatory Visit (INDEPENDENT_AMBULATORY_CARE_PROVIDER_SITE_OTHER): Payer: BLUE CROSS/BLUE SHIELD | Admitting: Orthopaedic Surgery

## 2018-06-02 VITALS — BP 145/78 | HR 83 | Ht 64.0 in | Wt 210.0 lb

## 2018-06-02 DIAGNOSIS — M1712 Unilateral primary osteoarthritis, left knee: Secondary | ICD-10-CM | POA: Diagnosis not present

## 2018-06-02 DIAGNOSIS — M25562 Pain in left knee: Secondary | ICD-10-CM

## 2018-06-02 DIAGNOSIS — M2242 Chondromalacia patellae, left knee: Secondary | ICD-10-CM | POA: Diagnosis not present

## 2018-06-02 NOTE — Progress Notes (Signed)
Office Visit Note   Patient: Hannah Lynch           Date of Birth: 02/18/1971           MRN: [id number] Visit Date: 06/02/2018              Requested by: Joette Catching, MD [address] [place], [place] [address] PCP: Joette Catching, MD   Assessment & Plan: Visit Diagnoses:  1. Primary osteoarthritis of left knee   2. Chondromalacia patellae, left knee     Plan:  #1: This time we will schedule her for an MRI scan of the left knee to rule out internal derangement.  She has had cortisone injection which did improve her swelling but did not have benefit in her pain. #2: Follow-up after MRI scan for delineation of treatment.  Follow-Up Instructions: No follow-ups on file.   Orders:  No orders of the defined types were placed in this encounter.  No orders of the defined types were placed in this encounter.     Procedures: No procedures performed   Clinical Data: No additional findings.   Subjective: Chief Complaint  Patient presents with  . Follow-up    L KNEE F/U SHOT MADE IT WORSE SWELLED AND COULDNT BEND KNEE. KNEE IS POPPING OR GIVING AWAY    HPI  Lorenzo was seen on July 3 for evaluation of her left knee.  At that time she had a 3 to 4-week history of pain.  This is insidious onset.  He has had an arthroscopy on the right knee which did have evidence of osteoarthritis.  The visit she had a corticosteroid injection to the left knee which unfortunately she had marked swelling and pain for 24 to 48 hours after the injection.  After that her swelling had improved but she still is having pain with ambulation.  She complains more of pain laterally than medially at this time.  Complaints of popping as well as giving way.  Of interesting note she did have a cholecystectomy apparently had developed an infection postop.  Infection was in her left axilla.  Apparently she states that the doctor told her she had a reaction from staples has erythema and pain.  Review of Systems    Constitutional: Negative for fatigue and fever.  HENT: Negative for ear pain.   Eyes: Negative for pain.  Respiratory: Negative for cough and shortness of breath.   Cardiovascular: Positive for leg swelling.  Gastrointestinal: Negative for constipation and diarrhea.  Genitourinary: Negative for difficulty urinating.  Musculoskeletal: Negative for back pain and neck pain.  Skin: Negative for rash.  Allergic/Immunologic: Negative for food allergies.  Neurological: Positive for weakness. Negative for numbness.  Hematological: Does not bruise/bleed easily.  Psychiatric/Behavioral: Negative for sleep disturbance.     Objective: Vital Signs: BP (!) 145/78 (BP Location: Left Arm, Patient Position: Sitting, Cuff Size: Normal)   Pulse 83   Ht 5\' 4"  (1.626 m)   Wt 210 lb (95.3 kg)   BMI 36.05 kg/m   Physical Exam  Constitutional: She is oriented to person, place, and time. She appears well-developed and well-nourished.  HENT:  Mouth/Throat: Oropharynx is clear and moist.  Eyes: Pupils are equal, round, and reactive to light. EOM are normal.  Pulmonary/Chest: Effort normal.  Neurological: She is alert and oriented to person, place, and time.  Skin: Skin is warm and dry.  Psychiatric: She has a normal mood and affect. Her behavior is normal.    Ortho Exam  Exam today reveals the need to have a mild effusion.  No warmth or erythema.  She has marked patellofemoral crepitance with range of motion of the knee.  Some pain with patellar trapping.  More at the lateral joint line than medial.  Do not see much in the way of marked instability.  Calf is supple and nontender.  Neurovascular intact distally.  Specialty Comments:  No specialty comments available.  Imaging: No results found.   PMFS History: Current Outpatient Medications  Medication Sig Dispense Refill  . citalopram (CELEXA) 20 MG tablet Take 20 mg by mouth daily.    . Cyanocobalamin (VITAMIN B 12 PO) Take by mouth.    .  doxepin (SINEQUAN) 25 MG capsule Take 1 capsule by mouth at bedtime.    Marland Kitchen doxycycline (VIBRAMYCIN) 100 MG capsule Take 1 capsule (100 mg total) by mouth 2 (two) times daily. (Patient not taking: Reported on 05/12/2018) 20 capsule 0  . gabapentin (NEURONTIN) 300 MG capsule Take 300 mg by mouth 3 (three) times daily.    Marland Kitchen HYDROcodone-acetaminophen (NORCO) 5-325 MG tablet Take 1 tablet by mouth every 4 (four) hours as needed for moderate pain. (Patient not taking: Reported on 05/12/2018) 40 tablet 0  . levothyroxine (SYNTHROID, LEVOTHROID) 25 MCG tablet Take 25 mcg by mouth daily.     Marland Kitchen levothyroxine (SYNTHROID, LEVOTHROID) 25 MCG tablet Take 25 mcg by mouth daily.  1  . LORazepam (ATIVAN) 0.5 MG tablet Take 1 tablet by mouth as needed.    . naproxen (NAPROSYN) 500 MG tablet Take 1 tablet (500 mg total) by mouth 2 (two) times daily. (Patient not taking: Reported on 03/14/2018) 30 tablet 0  . ondansetron (ZOFRAN ODT) 4 MG disintegrating tablet 4mg  ODT q4 hours prn nausea/vomit (Patient not taking: Reported on 05/12/2018) 12 tablet 0  . SUMAtriptan (IMITREX) 50 MG tablet Take 1 tablet by mouth as needed.    . terbinafine (LAMISIL) 250 MG tablet Take 250 mg by mouth daily.    . traMADol (ULTRAM) 50 MG tablet Take 2 tablets (100 mg total) by mouth every 6 (six) hours as needed. (Patient not taking: Reported on 05/12/2018) 16 tablet 0  . zolpidem (AMBIEN) 10 MG tablet Take 1 tablet by mouth as needed.     No current facility-administered medications for this visit.    Facility-Administered Medications Ordered in Other Visits  Medication Dose Route Frequency Provider Last Rate Last Dose  . lactated ringers infusion    Continuous PRN Jeani Hawking, CRNA        Patient Active Problem List   Diagnosis Date Noted  . Chronic cholecystitis   . Primary insomnia 06/25/2017  . Papanicolaou smear for cervical cancer screening 06/25/2017  . Bilateral hearing loss 06/25/2017  . Acute serous otitis media 06/25/2017    . Lumbar stenosis with neurogenic claudication 09/25/2014   Past Medical History:  Diagnosis Date  . Anxiety   . Degenerative disc disease, lumbar   . Depression   . Family history of adverse reaction to anesthesia    pt's mother has hx. of post-op N/V  . Headache   . Hypothyroidism   . Nasal septal deviation 11/2017  . Nasal turbinate hypertrophy 11/2017  . PONV (postoperative nausea and vomiting)   . Snoring 11/2017    Family History  Problem Relation Age of Onset  . Hypertension Mother   . Diabetes Mother   . Heart failure Father   . Hypertension Father   . Diabetes Father   .  Heart failure Other     Past Surgical History:  Procedure Laterality Date  . ABDOMINAL HYSTERECTOMY  2005   partial  . ANTERIOR CERVICAL DECOMP/DISCECTOMY FUSION  03/25/2007   C4-5  . CERVICAL FUSION  2013  . CHOLECYSTECTOMY N/A 03/26/2018   Procedure: LAPAROSCOPIC CHOLECYSTECTOMY;  Surgeon: Franky MachoJenkins, Mark, MD;  Location: [facility];  Service: General;  Laterality: N/A;  . KNEE ARTHROSCOPY Right   . LUMBAR LAMINECTOMY/DECOMPRESSION MICRODISCECTOMY  11/21/2004   L4-5  . MAXIMUM ACCESS (MAS)POSTERIOR LUMBAR INTERBODY FUSION (PLIF) 1 LEVEL N/A 09/25/2014   Procedure: Lumbar four-five MAXIMUM ACCESS (MAS) POSTERIOR LUMBAR INTERBODY FUSION (PLIF) 1 LEVEL;  Surgeon: Maeola HarmanJoseph Stern, MD;  Location: [facility];  Service: Neurosurgery;  Laterality: N/A;  . NASAL SEPTOPLASTY W/ TURBINOPLASTY N/A 12/07/2017   Procedure: NASAL SEPTOPLASTY WITH BILATERAL TURBINATE REDUCTION;  Surgeon: Christia ReadingBates, Dwight, MD;  Location: [facility];  Service: ENT;  Laterality: N/A;  . ORIF ANKLE FRACTURE Right   . UPPER GI ENDOSCOPY     Social History   Occupational History  . Not on file  Tobacco Use  . Smoking status: Never Smoker  . Smokeless tobacco: Never Used  Substance and Sexual Activity  . Alcohol use: No    Frequency: Never  . Drug use: No  . Sexual activity: Yes    Birth control/protection: None

## 2018-06-03 ENCOUNTER — Telehealth (INDEPENDENT_AMBULATORY_CARE_PROVIDER_SITE_OTHER): Payer: Self-pay | Admitting: Radiology

## 2018-06-03 NOTE — Telephone Encounter (Signed)
Have her come to office and try spider brace

## 2018-06-03 NOTE — Telephone Encounter (Signed)
I spoke with patient about MRI scan, and she asked if Dr Cleophas DunkerWhitfield thought a brace would help her knee?  She would like to have a knee brace if you think it would help her.  MRI is 06/10/18. Please call patient and advise.

## 2018-06-03 NOTE — Telephone Encounter (Signed)
PLEASE ADVISE.

## 2018-06-04 NOTE — Telephone Encounter (Signed)
LEFT MESSAGE FOR PT TO COME IN AND GET FITTED FOR KNEE BRACE

## 2018-06-09 ENCOUNTER — Other Ambulatory Visit (INDEPENDENT_AMBULATORY_CARE_PROVIDER_SITE_OTHER): Payer: Self-pay | Admitting: Radiology

## 2018-06-10 ENCOUNTER — Ambulatory Visit (HOSPITAL_COMMUNITY)
Admission: RE | Admit: 2018-06-10 | Discharge: 2018-06-10 | Disposition: A | Discharge status: Home / Self Care | Payer: BLUE CROSS/BLUE SHIELD | Source: Ambulatory Visit | Attending: Orthopaedic Surgery | Admitting: Orthopaedic Surgery

## 2018-06-10 DIAGNOSIS — M1712 Unilateral primary osteoarthritis, left knee: Secondary | ICD-10-CM | POA: Insufficient documentation

## 2018-06-10 DIAGNOSIS — M25562 Pain in left knee: Secondary | ICD-10-CM | POA: Diagnosis not present

## 2018-06-10 HISTORY — PX: TONSILLECTOMY: SUR1361

## 2018-06-16 ENCOUNTER — Encounter (INDEPENDENT_AMBULATORY_CARE_PROVIDER_SITE_OTHER): Payer: Self-pay | Admitting: Orthopaedic Surgery

## 2018-06-16 ENCOUNTER — Ambulatory Visit (INDEPENDENT_AMBULATORY_CARE_PROVIDER_SITE_OTHER): Payer: BLUE CROSS/BLUE SHIELD | Admitting: Orthopaedic Surgery

## 2018-06-16 VITALS — BP 107/71 | HR 90 | Ht 70.0 in | Wt 210.0 lb

## 2018-06-16 DIAGNOSIS — M25562 Pain in left knee: Secondary | ICD-10-CM

## 2018-06-16 DIAGNOSIS — G8929 Other chronic pain: Secondary | ICD-10-CM | POA: Diagnosis not present

## 2018-06-16 MED ORDER — LIDOCAINE HCL 1 % IJ SOLN
2.0000 mL | INTRAMUSCULAR | Status: AC | PRN
  Administered 2018-06-16: 2 mL

## 2018-06-16 MED ORDER — BUPIVACAINE HCL 0.5 % IJ SOLN
2.0000 mL | INTRAMUSCULAR | Status: AC | PRN
  Administered 2018-06-16: 2 mL via INTRA_ARTICULAR

## 2018-06-16 MED ORDER — METHYLPREDNISOLONE ACETATE 40 MG/ML IJ SUSP
80.0000 mg | INTRAMUSCULAR | Status: AC | PRN
  Administered 2018-06-16: 80 mg

## 2018-06-16 NOTE — Progress Notes (Signed)
Office Visit Note   Patient: Hannah Lynch           Date of Birth: Feb 05, 1971           MRN: [id number] Visit Date: 06/16/2018              Requested by: Joette Catching, MD [address] [place], [place] [address] PCP: Joette Catching, MD   Assessment & Plan: Visit Diagnoses:  1. Chronic pain of left knee     Plan: Mild tricompartmental arthritis left knee.  Long discussion regarding MRI scan findings.  Will inject the lateral parapatellar region with cortisone and monitor response.  Continue with spider brace  Follow-Up Instructions: Return if symptoms worsen or fail to improve.   Orders:  Orders Placed This Encounter  Procedures  . Large Joint Inj: L knee   No orders of the defined types were placed in this encounter.     Procedures: Large Joint Inj: L knee on 06/16/2018 11:44 AM Indications: pain and diagnostic evaluation Details: 25 G 1.5 in needle, anteromedial approach  Arthrogram: No  Medications: 2 mL lidocaine 1 %; 2 mL bupivacaine 0.5 %; 80 mg methylPREDNISolone acetate 40 MG/ML Procedure, treatment alternatives, risks and benefits explained, specific risks discussed. Consent was given by the patient. Patient was prepped and draped in the usual sterile fashion.       Clinical Data: No additional findings.   Subjective: Chief Complaint  Patient presents with  . Follow-up    MRI F/U  Prior office notes persistent left knee pain.  Minimal response to intra-articular cortisone.  MRI scan demonstrates mild arthritis in the medial lateral compartment with a little more thinning at the patellofemoral joint.  Symptoms are consistent with chondromalacia patella.  Has been wearing spider brace with some relief.  RI scan demonstrates no evidence of meniscal or ligamentous injury.  HPI  Review of Systems  Constitutional: Negative for fatigue and fever.  HENT: Negative for ear pain.   Eyes: Negative for pain.  Respiratory: Negative for cough and shortness of  breath.   Cardiovascular: Positive for leg swelling.  Gastrointestinal: Negative for constipation and diarrhea.  Genitourinary: Negative for difficulty urinating.  Musculoskeletal: Negative for back pain and neck pain.  Skin: Negative for rash.  Allergic/Immunologic: Negative for food allergies.  Neurological: Positive for weakness. Negative for numbness.  Hematological: Does not bruise/bleed easily.  Psychiatric/Behavioral: Negative for sleep disturbance.     Objective: Vital Signs: BP 107/71 (BP Location: Left Arm, Patient Position: Sitting, Cuff Size: Normal)   Pulse 90   Ht 5\' 10"  (1.778 m)   Wt 210 lb (95.3 kg)   BMI 30.13 kg/m   Physical Exam  Constitutional: She is oriented to person, place, and time. She appears well-developed and well-nourished.  HENT:  Mouth/Throat: Oropharynx is clear and moist.  Eyes: Pupils are equal, round, and reactive to light. EOM are normal.  Pulmonary/Chest: Effort normal.  Neurological: She is alert and oriented to person, place, and time.  Skin: Skin is warm and dry.  Psychiatric: She has a normal mood and affect. Her behavior is normal.    Ortho Exam awake alert and oriented x3.  Comfortable sitting.  Left knee without effusion.  Some localized tenderness along the lateral parapatellar region.  Mild patellar crepitation.  No instability with a varus or valgus stress.  Negative anterior drawer sign.  No distal edema.  Neurovascular exam intact.  Full range of motion right knee. Specialty Comments:  No specialty comments available.  Imaging: No results found.   PMFS History: Patient Active Problem List   Diagnosis Date Noted  . Chronic cholecystitis   . Primary insomnia 06/25/2017  . Papanicolaou smear for cervical cancer screening 06/25/2017  . Bilateral hearing loss 06/25/2017  . Acute serous otitis media 06/25/2017  . Lumbar stenosis with neurogenic claudication 09/25/2014   Past Medical History:  Diagnosis Date  . Anxiety     . Degenerative disc disease, lumbar   . Depression   . Family history of adverse reaction to anesthesia    pt's mother has hx. of post-op N/V  . Headache   . Hypothyroidism   . Nasal septal deviation 11/2017  . Nasal turbinate hypertrophy 11/2017  . PONV (postoperative nausea and vomiting)   . Snoring 11/2017    Family History  Problem Relation Age of Onset  . Hypertension Mother   . Diabetes Mother   . Heart failure Father   . Hypertension Father   . Diabetes Father   . Heart failure Other     Past Surgical History:  Procedure Laterality Date  . ABDOMINAL HYSTERECTOMY  2005   partial  . ANTERIOR CERVICAL DECOMP/DISCECTOMY FUSION  03/25/2007   C4-5  . CERVICAL FUSION  2013  . CHOLECYSTECTOMY N/A 03/26/2018   Procedure: LAPAROSCOPIC CHOLECYSTECTOMY;  Surgeon: Franky MachoJenkins, Mark, MD;  Location: [facility];  Service: General;  Laterality: N/A;  . KNEE ARTHROSCOPY Right   . LUMBAR LAMINECTOMY/DECOMPRESSION MICRODISCECTOMY  11/21/2004   L4-5  . MAXIMUM ACCESS (MAS)POSTERIOR LUMBAR INTERBODY FUSION (PLIF) 1 LEVEL N/A 09/25/2014   Procedure: Lumbar four-five MAXIMUM ACCESS (MAS) POSTERIOR LUMBAR INTERBODY FUSION (PLIF) 1 LEVEL;  Surgeon: Maeola HarmanJoseph Stern, MD;  Location: [facility];  Service: Neurosurgery;  Laterality: N/A;  . NASAL SEPTOPLASTY W/ TURBINOPLASTY N/A 12/07/2017   Procedure: NASAL SEPTOPLASTY WITH BILATERAL TURBINATE REDUCTION;  Surgeon: Christia ReadingBates, Dwight, MD;  Location: [facility];  Service: ENT;  Laterality: N/A;  . ORIF ANKLE FRACTURE Right   . UPPER GI ENDOSCOPY     Social History   Occupational History  . Not on file  Tobacco Use  . Smoking status: Never Smoker  . Smokeless tobacco: Never Used  Substance and Sexual Activity  . Alcohol use: No    Frequency: Never  . Drug use: No  . Sexual activity: Yes    Birth control/protection: None

## 2018-07-20 DIAGNOSIS — Z8619 Personal history of other infectious and parasitic diseases: Secondary | ICD-10-CM

## 2018-07-20 HISTORY — DX: Personal history of other infectious and parasitic diseases: Z86.19

## 2018-08-19 DIAGNOSIS — M47812 Spondylosis without myelopathy or radiculopathy, cervical region: Secondary | ICD-10-CM | POA: Insufficient documentation

## 2018-08-19 DIAGNOSIS — M5459 Other low back pain: Secondary | ICD-10-CM | POA: Insufficient documentation

## 2018-08-25 ENCOUNTER — Encounter (INDEPENDENT_AMBULATORY_CARE_PROVIDER_SITE_OTHER): Payer: Self-pay | Admitting: Orthopaedic Surgery

## 2018-08-25 ENCOUNTER — Ambulatory Visit (INDEPENDENT_AMBULATORY_CARE_PROVIDER_SITE_OTHER): Payer: BLUE CROSS/BLUE SHIELD | Admitting: Orthopaedic Surgery

## 2018-08-25 VITALS — BP 117/74 | HR 89 | Ht 65.0 in | Wt 210.0 lb

## 2018-08-25 DIAGNOSIS — M1712 Unilateral primary osteoarthritis, left knee: Secondary | ICD-10-CM

## 2018-08-25 DIAGNOSIS — M79671 Pain in right foot: Secondary | ICD-10-CM

## 2018-08-25 MED ORDER — BUPIVACAINE HCL 0.5 % IJ SOLN
2.0000 mL | INTRAMUSCULAR | Status: AC | PRN
  Administered 2018-08-25: 2 mL via INTRA_ARTICULAR

## 2018-08-25 MED ORDER — LIDOCAINE HCL 1 % IJ SOLN
2.0000 mL | INTRAMUSCULAR | Status: AC | PRN
  Administered 2018-08-25: 2 mL

## 2018-08-25 MED ORDER — METHYLPREDNISOLONE ACETATE 40 MG/ML IJ SUSP
80.0000 mg | INTRAMUSCULAR | Status: AC | PRN
  Administered 2018-08-25: 80 mg

## 2018-08-25 NOTE — Progress Notes (Signed)
Office Visit Note   Patient: Hannah Lynch           Date of Birth: 1971-08-22           MRN: [id number] Visit Date: 08/25/2018              Requested by: Joette Catching, MD [address] [place], [place] [address] PCP: Joette Catching, MD   Assessment & Plan: Visit Diagnoses:  1. Unilateral primary osteoarthritis, left knee   2. Right foot pain     Plan: Prior MRI scan demonstrates mild osteoarthritis left knee with a complete discoid lateral meniscus.  No obvious tear.  Has done well with cortisone in the past would like another one today.  Also recently has developed some pain in the area of her right ankle.  She is approximately 25 to 6 years status post ORIF of an ankle fracture with no recent injury or trauma.  Recent films were reviewed with evidence of what might be some early degenerative arthrosis of the right ankle joint.  Discussed use of medicines comfortable shoes and consider cortisone injection in the future Follow-Up Instructions: Return if symptoms worsen or fail to improve.   Orders:  Orders Placed This Encounter  Procedures  . Large Joint Inj: L knee   No orders of the defined types were placed in this encounter.     Procedures: Large Joint Inj: L knee on 08/25/2018 1:41 PM Indications: pain and diagnostic evaluation Details: 25 G 1.5 in needle, anteromedial approach  Arthrogram: No  Medications: 2 mL lidocaine 1 %; 2 mL bupivacaine 0.5 %; 80 mg methylPREDNISolone acetate 40 MG/ML Procedure, treatment alternatives, risks and benefits explained, specific risks discussed. Consent was given by the patient. Patient was prepped and draped in the usual sterile fashion.       Clinical Data: No additional findings.   Subjective: Chief Complaint  Patient presents with  . Follow-up    R ANKLE/FOOT PAIN FOR 2 WEEKS NO INJURY ALSO HAVING RE CURRENT L KNEE PAIN   Osteoarthritis all 3 compartments left knee by prior MRI scan.  Changes were mild.  Also has  discoid lateral meniscus without a tear.  Has done well with cortisone in the past and would like another one.  Also recently experienced insidious onset of right ankle pain.  No injury or trauma but in 1993 had open reduction internal fixation of her ankle fracture. Recent films on August 7.  I reviewed these in the PACS system.  Films of the right foot demonstrate a small posterior heel spur which is asymptomatic and a small posterior heel spur which is also asymptomatic.  There appeared to be some degenerative changes about the ankle joint HPI  Review of Systems  Constitutional: Negative for fatigue and fever.  HENT: Negative for ear pain.   Eyes: Negative for pain.  Respiratory: Negative for cough and shortness of breath.   Cardiovascular: Negative for leg swelling.  Gastrointestinal: Negative for constipation and diarrhea.  Genitourinary: Negative for difficulty urinating.  Musculoskeletal: Positive for back pain and neck pain.  Skin: Negative for rash.  Allergic/Immunologic: Negative for food allergies.  Neurological: Positive for weakness and numbness.  Hematological: Bruises/bleeds easily.  Psychiatric/Behavioral: Positive for sleep disturbance.     Objective: Vital Signs: BP 117/74 (BP Location: Left Arm, Patient Position: Sitting, Cuff Size: Normal)   Pulse 89   Ht 5\' 5"  (1.651 m)   Wt 210 lb (95.3 kg)   BMI 34.95 kg/m   Physical  Exam  Constitutional: She is oriented to person, place, and time. She appears well-developed and well-nourished.  HENT:  Mouth/Throat: Oropharynx is clear and moist.  Eyes: Pupils are equal, round, and reactive to light. EOM are normal.  Pulmonary/Chest: Effort normal.  Neurological: She is alert and oriented to person, place, and time.  Skin: Skin is warm and dry.  Psychiatric: She has a normal mood and affect. Her behavior is normal.    Ortho Exam left knee with predominant lateral knee pain but with out effusion.  Some pain medially.   No patellar crepitation.  Full knee extension and flexion over 100 degrees without instability.  Without skin changes.  Right ankle with some mild discomfort across the ankle joint.  No skin change.  Neurovascular exam intact some pain with inversion eversion of the ankle but no pain over either malleoli.  Old surgical sites of healed without problem. Specialty Comments:  No specialty comments available.  Imaging: No results found.   PMFS History: Patient Active Problem List   Diagnosis Date Noted  . Chronic cholecystitis   . Primary insomnia 06/25/2017  . Papanicolaou smear for cervical cancer screening 06/25/2017  . Bilateral hearing loss 06/25/2017  . Acute serous otitis media 06/25/2017  . Lumbar stenosis with neurogenic claudication 09/25/2014   Past Medical History:  Diagnosis Date  . Anxiety   . Degenerative disc disease, lumbar   . Depression   . Family history of adverse reaction to anesthesia    pt's mother has hx. of post-op N/V  . Headache   . Hypothyroidism   . Nasal septal deviation 11/2017  . Nasal turbinate hypertrophy 11/2017  . PONV (postoperative nausea and vomiting)   . Snoring 11/2017    Family History  Problem Relation Age of Onset  . Hypertension Mother   . Diabetes Mother   . Heart failure Father   . Hypertension Father   . Diabetes Father   . Heart failure Other     Past Surgical History:  Procedure Laterality Date  . ABDOMINAL HYSTERECTOMY  2005   partial  . ANTERIOR CERVICAL DECOMP/DISCECTOMY FUSION  03/25/2007   C4-5  . CERVICAL FUSION  2013  . CHOLECYSTECTOMY N/A 03/26/2018   Procedure: LAPAROSCOPIC CHOLECYSTECTOMY;  Surgeon: Franky Macho, MD;  Location: [facility];  Service: General;  Laterality: N/A;  . KNEE ARTHROSCOPY Right   . LUMBAR LAMINECTOMY/DECOMPRESSION MICRODISCECTOMY  11/21/2004   L4-5  . MAXIMUM ACCESS (MAS)POSTERIOR LUMBAR INTERBODY FUSION (PLIF) 1 LEVEL N/A 09/25/2014   Procedure: Lumbar four-five MAXIMUM ACCESS (MAS)  POSTERIOR LUMBAR INTERBODY FUSION (PLIF) 1 LEVEL;  Surgeon: Maeola Harman, MD;  Location: [facility];  Service: Neurosurgery;  Laterality: N/A;  . NASAL SEPTOPLASTY W/ TURBINOPLASTY N/A 12/07/2017   Procedure: NASAL SEPTOPLASTY WITH BILATERAL TURBINATE REDUCTION;  Surgeon: Christia Reading, MD;  Location: [facility];  Service: ENT;  Laterality: N/A;  . ORIF ANKLE FRACTURE Right   . TONSILLECTOMY  06/2018  . UPPER GI ENDOSCOPY     Social History   Occupational History  . Not on file  Tobacco Use  . Smoking status: Never Smoker  . Smokeless tobacco: Never Used  Substance and Sexual Activity  . Alcohol use: No    Frequency: Never  . Drug use: No  . Sexual activity: Yes    Birth control/protection: None

## 2018-08-30 ENCOUNTER — Emergency Department (HOSPITAL_COMMUNITY)
Admission: EM | Admit: 2018-08-30 | Discharge: 2018-08-30 | Disposition: A | Discharge status: Home / Self Care | Payer: BLUE CROSS/BLUE SHIELD | Attending: Emergency Medicine | Admitting: Emergency Medicine

## 2018-08-30 ENCOUNTER — Encounter (HOSPITAL_COMMUNITY): Payer: Self-pay | Admitting: Emergency Medicine

## 2018-08-30 DIAGNOSIS — E039 Hypothyroidism, unspecified: Secondary | ICD-10-CM | POA: Diagnosis not present

## 2018-08-30 DIAGNOSIS — M549 Dorsalgia, unspecified: Secondary | ICD-10-CM | POA: Diagnosis present

## 2018-08-30 DIAGNOSIS — M546 Pain in thoracic spine: Secondary | ICD-10-CM | POA: Diagnosis not present

## 2018-08-30 DIAGNOSIS — Z79899 Other long term (current) drug therapy: Secondary | ICD-10-CM | POA: Diagnosis not present

## 2018-08-30 MED ORDER — HYDROCODONE-ACETAMINOPHEN 5-325 MG PO TABS
1.0000 | ORAL_TABLET | Freq: Once | ORAL | Status: AC
  Administered 2018-08-30: 1 via ORAL
  Filled 2018-08-30: qty 1

## 2018-08-30 MED ORDER — METHOCARBAMOL 500 MG PO TABS: 500.0000 mg | ORAL_TABLET | Freq: Two times a day (BID) | ORAL | 0 refills | Status: DC

## 2018-08-30 MED ORDER — METHOCARBAMOL 500 MG PO TABS
500.0000 mg | ORAL_TABLET | Freq: Once | ORAL | Status: AC
  Administered 2018-08-30: 500 mg via ORAL
  Filled 2018-08-30: qty 1

## 2018-08-30 MED ORDER — NAPROXEN 500 MG PO TABS: 500.0000 mg | ORAL_TABLET | Freq: Two times a day (BID) | ORAL | 0 refills | Status: DC

## 2018-08-30 NOTE — ED Triage Notes (Signed)
Pt reports mid back pain x 3 days with no injury.  States she has lower and upper back problems but never mid.

## 2018-08-30 NOTE — Discharge Instructions (Addendum)
You were seen here today for Back Pain: Back pain is discomfort in the lower back that may be due to injuries to muscles and ligaments around the spine. Occasionally, it may be caused by a problem to a part of the spine called a disc. Your back pain should be treated with medicines listed below as well as back exercises and this back pain should get better over the next 2 weeks. Most patients get completely well in 4 weeks. It is important to know however, if you develop severe or worsening pain, low back pain with fever, numbness, weakness or inability to walk or urinate, you should return to the ER immediately.  Please follow up with your doctor this week for a recheck if still having symptoms.  HOME INSTRUCTIONS Self - care:  The application of heat can help soothe the pain.  Maintaining your daily activities, including walking (this is encouraged), as it will help you get better faster than just staying in bed. Do not life, push, pull anything more than 10 pounds for the next week. I am attaching back exercises that you can do at home to help facilitate your recovery.   Back Exercises - I have attached a handout on back exercises that can be done at home to help facilitate your recovery.   Medications are also useful to help with pain control.   Acetaminophen.  This medication is generally safe, and found over the counter. Take as directed for your age. You should not take more than 8 of the extra strength (500mg ) pills a day (max dose is 4000mg  total OVER one day)  Non steroidal anti inflammatory: This includes medications including Ibuprofen, naproxen and Mobic; These medications help both pain and swelling and are very useful in treating back pain.  They should be taken with food, as they can cause stomach upset, and more seriously, stomach bleeding. Do not combine the medications.    Muscle relaxants:  These medications can help with muscle tightness that is a cause of lower back pain.  Most of  these medications can cause drowsiness, and it is not safe to drive or use dangerous machinery while taking them. They are primarily helpful when taken at night before sleep.  You will need to follow up with your primary healthcare provider  or the Orthopedist in 1-2 weeks for reassessment and persistent symptoms.  Be aware that if you develop new symptoms, such as a fever, leg weakness, difficulty with or loss of control of your urine or bowels, abdominal pain, or more severe pain, you will need to seek medical attention and/or return to the Emergency department.  If you start developing fever, nausea, vomiting, painful urination, blood in your urine these may be signs of a kidney stone or kidney infection and you should return for further evaluation.  Additional Information:  Your vital signs today were: BP 138/89 (BP Location: Right Arm)    Pulse 79    Temp 98.3 F (36.8 C) (Oral)    Resp 18    Ht 5\' 5"  (1.651 m)    Wt 97.5 kg    SpO2 100%    BMI 35.78 kg/m  If your blood pressure (BP) was elevated above 135/85 this visit, please have this repeated by your doctor within one month. ---------------

## 2018-08-30 NOTE — ED Provider Notes (Signed)
[facility] EMERGENCY DEPARTMENT Provider Note   CSN: [id number] Arrival date & time: 08/30/18  1537     History   Chief Complaint Chief Complaint  Patient presents with  . Back Pain    HPI Hannah Lynch is a 47 y.o. female with a history of DDD who presents emergency department today for right sided back pain.  Patient reports for the last several days she has had right-sided, mid back pain that she describes as sharp.  She notes it was gradual in onset after work but has increased since waking up this morning.  She notes it is painful with palpation as well as movement including turning, bending and walking.  She is tried ibuprofen for symptoms without any relief.  She notes a history of back pain is similar to this.  She states that she can feel her muscles spasming at times.  She denies any fever, nausea, vomiting, history of kidney stones, urinary frequency, urinary urgency, dysuria, hematuria. Denies history of cancer, trauma, fever, night pain, IV drug use, recent spinal manipulation or procedures, upper back pain or neck pain, numbness/tingling/weakness of the lower extremities, urinary retention, loss of bowel/bladder function, saddle anesthesia, or unexplained weight loss.  HPI  Past Medical History:  Diagnosis Date  . Anxiety   . Degenerative disc disease, lumbar   . Depression   . Family history of adverse reaction to anesthesia    pt's mother has hx. of post-op N/V  . Headache   . Hypothyroidism   . Nasal septal deviation 11/2017  . Nasal turbinate hypertrophy 11/2017  . PONV (postoperative nausea and vomiting)   . Snoring 11/2017    Patient Active Problem List   Diagnosis Date Noted  . Chronic cholecystitis   . Primary insomnia 06/25/2017  . Papanicolaou smear for cervical cancer screening 06/25/2017  . Bilateral hearing loss 06/25/2017  . Acute serous otitis media 06/25/2017  . Lumbar stenosis with neurogenic claudication 09/25/2014    Past Surgical  History:  Procedure Laterality Date  . ABDOMINAL HYSTERECTOMY  2005   partial  . ANTERIOR CERVICAL DECOMP/DISCECTOMY FUSION  03/25/2007   C4-5  . CERVICAL FUSION  2013  . CHOLECYSTECTOMY N/A 03/26/2018   Procedure: LAPAROSCOPIC CHOLECYSTECTOMY;  Surgeon: Franky Macho, MD;  Location: [facility];  Service: General;  Laterality: N/A;  . KNEE ARTHROSCOPY Right   . LUMBAR LAMINECTOMY/DECOMPRESSION MICRODISCECTOMY  11/21/2004   L4-5  . MAXIMUM ACCESS (MAS)POSTERIOR LUMBAR INTERBODY FUSION (PLIF) 1 LEVEL N/A 09/25/2014   Procedure: Lumbar four-five MAXIMUM ACCESS (MAS) POSTERIOR LUMBAR INTERBODY FUSION (PLIF) 1 LEVEL;  Surgeon: Maeola Harman, MD;  Location: [facility];  Service: Neurosurgery;  Laterality: N/A;  . NASAL SEPTOPLASTY W/ TURBINOPLASTY N/A 12/07/2017   Procedure: NASAL SEPTOPLASTY WITH BILATERAL TURBINATE REDUCTION;  Surgeon: Christia Reading, MD;  Location: [facility];  Service: ENT;  Laterality: N/A;  . ORIF ANKLE FRACTURE Right   . TONSILLECTOMY  06/2018  . UPPER GI ENDOSCOPY       OB History   None      Home Medications    Prior to Admission medications   Medication Sig Start Date End Date Taking? Authorizing Provider  baclofen (LIORESAL) 10 MG tablet Take by mouth. 08/19/18 09/18/18  [provider]  citalopram (CELEXA) 20 MG tablet Take 20 mg by mouth daily.    [provider]  Cyanocobalamin (VITAMIN B 12 PO) Take by mouth.    [provider]  doxepin (SINEQUAN) 25 MG capsule Take 1  capsule by mouth at bedtime. 03/10/18 03/10/19  [provider]  doxycycline (VIBRAMYCIN) 100 MG capsule Take 1 capsule (100 mg total) by mouth 2 (two) times daily. Patient not taking: Reported on 05/12/2018 04/04/18   Devoria Albe, MD  gabapentin (NEURONTIN) 300 MG capsule Take 300 mg by mouth 3 (three) times daily.    [provider]  HYDROcodone-acetaminophen (NORCO) 5-325 MG tablet Take 1 tablet by mouth every 4 (four) hours as needed for  moderate pain. Patient not taking: Reported on 05/12/2018 03/26/18   Franky Macho, MD  levothyroxine (SYNTHROID, LEVOTHROID) 25 MCG tablet Take 25 mcg by mouth daily.  01/23/17 03/26/18  [provider]  levothyroxine (SYNTHROID, LEVOTHROID) 25 MCG tablet Take 25 mcg by mouth daily. 04/16/18   [provider]  LORazepam (ATIVAN) 0.5 MG tablet Take 1 tablet by mouth as needed. 07/09/17   [provider]  naproxen (NAPROSYN) 500 MG tablet Take 1 tablet (500 mg total) by mouth 2 (two) times daily. Patient not taking: Reported on 03/14/2018 02/01/18   Glynn Octave, MD  ondansetron (ZOFRAN ODT) 4 MG disintegrating tablet 4mg  ODT q4 hours prn nausea/vomit Patient not taking: Reported on 05/12/2018 03/14/18   Bethann Berkshire, MD  SUMAtriptan (IMITREX) 50 MG tablet Take 1 tablet by mouth as needed. 10/14/17   [provider]  terbinafine (LAMISIL) 250 MG tablet Take 250 mg by mouth daily.    [provider]  traMADol (ULTRAM) 50 MG tablet Take 2 tablets (100 mg total) by mouth every 6 (six) hours as needed. Patient not taking: Reported on 05/12/2018 04/04/18   Devoria Albe, MD  zolpidem (AMBIEN) 10 MG tablet Take 1 tablet by mouth as needed. 10/09/17   [provider]    Family History Family History  Problem Relation Age of Onset  . Hypertension Mother   . Diabetes Mother   . Heart failure Father   . Hypertension Father   . Diabetes Father   . Heart failure Other     Social History Social History   Tobacco Use  . Smoking status: Never Smoker  . Smokeless tobacco: Never Used  Substance Use Topics  . Alcohol use: No    Frequency: Never  . Drug use: No     Allergies   Patient has no known allergies.   Review of Systems Review of Systems  All other systems reviewed and are negative.    Physical Exam Updated Vital Signs BP 138/89 (BP Location: Right Arm)   Pulse 79   Temp 98.3 F (36.8 C) (Oral)   Resp 18   Ht 5\' 5"  (1.651 m)   Wt  97.5 kg   SpO2 100%   BMI 35.78 kg/m   Physical Exam  Constitutional: She appears well-developed and well-nourished. No distress.  Non-toxic appearing  HENT:  Head: Normocephalic and atraumatic.  Right Ear: External ear normal.  Left Ear: External ear normal.  Neck: Normal range of motion. Neck supple. No spinous process tenderness present. No neck rigidity. Normal range of motion present.  Cardiovascular: Normal rate, regular rhythm, normal heart sounds and intact distal pulses.  No murmur heard. Pulses:      Radial pulses are 2+ on the right side, and 2+ on the left side.       Femoral pulses are 2+ on the right side, and 2+ on the left side.      Dorsalis pedis pulses are 2+ on the right side, and 2+ on the left side.  Posterior tibial pulses are 2+ on the right side, and 2+ on the left side.  Pulmonary/Chest: Effort normal and breath sounds normal. No respiratory distress.  Abdominal: Soft. Bowel sounds are normal. She exhibits no pulsatile midline mass. There is no tenderness. There is no rigidity, no rebound and no CVA tenderness.  Musculoskeletal:       Thoracic back: She exhibits tenderness and spasm.       Back:  Posterior and appearance appears normal. No evidence of obvious scoliosis or kyphosis. No obvious signs of skin changes, trauma, deformity, infection. No C, T, or L spine tenderness or step-offs to palpation. No C, or L paraspinal tenderness. Right thoracic paraspinal ttp and muscle spasms. Lung expansion normal. Bilateral lower extremity strength 5 out of 5 including extensor hallucis longus. Patellar and Achilles deep tendon reflex 2+ and equal bilaterally. Sensation of lower extremities grossly intact. Gait able but patient notes painful. Lower extremity compartments soft. PT and DP 2+ b/l. Cap refill <2 seconds.   Neurological: She is alert.  No foot drop  Skin: Skin is warm, dry and intact. Capillary refill takes less than 2 seconds. No rash noted. She is not  diaphoretic. No erythema.  No vesicular-like rash noted.  Nursing note and vitals reviewed.     ED Treatments / Results  Labs (all labs ordered are listed, but only abnormal results are displayed) Labs Reviewed - No data to display  EKG None  Radiology No results found.  Procedures Procedures (including critical care time)  Medications Ordered in ED Medications  methocarbamol (ROBAXIN) tablet 500 mg (has no administration in time range)  HYDROcodone-acetaminophen (NORCO/VICODIN) 5-325 MG per tablet 1 tablet (has no administration in time range)     Initial Impression / Assessment and Plan / ED Course  I have reviewed the triage vital signs and the nursing notes.  Pertinent labs & imaging results that were available during my care of the patient were reviewed by me and considered in my medical decision making (see chart for details).     47 y.o. year old female with back pain.  No neurologic deficits and normal neuro exam.  Patient can walk but states it is painful.  No bowel or bladder incontinence.  No urinary retention or saddle anesthesia.  No concern for cauda equina.  No history of trauma, personal history of cancer, night sweats or weight loss that would warrant a x-ray at this time.  No spinal injections, fever or IV drug use that make me concerned for spinal hematoma or abscess.  No pulsatile mass of the abdomen.  No abdominal tenderness to palpation or guarding to make me concern for intra-abdominal pathology.  No urinary symptoms, history of kidney stones, fever, N/V to make me concerned for cystitis versus pyelonephritis versus kidney stone at this time. Did discuss reasons to return. Patient with right thoracic paraspinal ttp and spasms. This was treated in the department.  Suspect patient's symptoms are musculoskeletal in nature.   Will treat the patient with back exercises, activity modification (note given for work), muscle relaxers, NSAIDs (no history of kidney  disease or GI bleed). sciatica.  Patient is to follow-up with PCP (Dr. Lysbeth Galas) versus orthopedics.  Strict return precautions discussed.  Patient appears safe for discharge.  Final Clinical Impressions(s) / ED Diagnoses   Final diagnoses:  Acute right-sided thoracic back pain    ED Discharge Orders         Ordered    naproxen (NAPROSYN) 500 MG tablet  2 times daily     08/30/18 1903    methocarbamol (ROBAXIN) 500 MG tablet  2 times daily     08/30/18 1903           Princella Pellegrini 08/30/18 1909    Marily Memos, MD 08/30/18 [phone]

## 2018-08-30 NOTE — ED Notes (Signed)
Thoracic back pain that is more extensive today. History of chronic back issues.

## 2018-09-28 ENCOUNTER — Emergency Department (HOSPITAL_COMMUNITY): Payer: BLUE CROSS/BLUE SHIELD

## 2018-09-28 ENCOUNTER — Emergency Department (HOSPITAL_COMMUNITY)
Admission: EM | Admit: 2018-09-28 | Discharge: 2018-09-29 | Disposition: A | Discharge status: Home / Self Care | Payer: BLUE CROSS/BLUE SHIELD | Attending: Emergency Medicine | Admitting: Emergency Medicine

## 2018-09-28 ENCOUNTER — Other Ambulatory Visit: Payer: Self-pay

## 2018-09-28 ENCOUNTER — Encounter (HOSPITAL_COMMUNITY): Payer: Self-pay | Admitting: Emergency Medicine

## 2018-09-28 DIAGNOSIS — R072 Precordial pain: Secondary | ICD-10-CM | POA: Diagnosis not present

## 2018-09-28 DIAGNOSIS — Z79899 Other long term (current) drug therapy: Secondary | ICD-10-CM | POA: Insufficient documentation

## 2018-09-28 DIAGNOSIS — E039 Hypothyroidism, unspecified: Secondary | ICD-10-CM | POA: Insufficient documentation

## 2018-09-28 DIAGNOSIS — R079 Chest pain, unspecified: Secondary | ICD-10-CM | POA: Diagnosis present

## 2018-09-28 LAB — BASIC METABOLIC PANEL
Anion gap: 6 (ref 5–15)
BUN: 18 mg/dL (ref 6–20)
CO2: 26 mmol/L (ref 22–32)
Calcium: 9 mg/dL (ref 8.9–10.3)
Chloride: 104 mmol/L (ref 98–111)
Creatinine, Ser: 0.98 mg/dL (ref 0.44–1.00)
GFR calc Af Amer: >60 mL/min (ref >60)
GFR calc non Af Amer: >60 mL/min (ref >60)
Glucose, Bld: 95 mg/dL (ref 70–99)
Potassium: 3.9 mmol/L (ref 3.5–5.1)
Sodium: 136 mmol/L (ref 135–145)

## 2018-09-28 LAB — CBC
HCT: 38.7 % (ref 36.0–46.0)
Hemoglobin: 12.3 g/dL (ref 12.0–15.0)
MCH: 29.6 pg (ref 26.0–34.0)
MCHC: 31.8 g/dL (ref 30.0–36.0)
MCV: 93 fL (ref 80.0–100.0)
Platelets: 312 10*3/uL (ref 150–400)
RBC: 4.16 MIL/uL (ref 3.87–5.11)
RDW: 13.7 % (ref 11.5–15.5)
WBC: 9.3 10*3/uL (ref 4.0–10.5)
nRBC: 0 % (ref 0.0–0.2)

## 2018-09-28 LAB — TROPONIN I: Troponin I: <0.03 ng/mL (ref <0.03)

## 2018-09-28 NOTE — Discharge Instructions (Signed)
Take a baby aspirin a day.  Follow-up with your primary care doctor make an appointment to follow-up with cardiology.  Get seen immediately if chest pain starts to last 15 minutes or longer.  Today's work-up without any acute findings.

## 2018-09-28 NOTE — ED Triage Notes (Signed)
Pt c/o chest pain, sob and high blood pressure for a couple of days. Pt was seen by pcp yesterday for the same.

## 2018-09-28 NOTE — ED Provider Notes (Signed)
[facility]   CSN: [id number] Arrival date & time: 09/28/18  2043     History   Chief Complaint Chief Complaint  Patient presents with  . Chest Pain    HPI Hannah Lynch is a 47 y.o. female.  Patient with complaint of chest pain for 3 days.  Pain is substernal.'s been on and off.  Occurring frequently throughout the day.  Only lasts for 2 minutes.  No diaphoresis no shortness of breath no nausea or vomiting.  Patient did state to triage that she had shortness of breath with it.  And noted that her blood pressure has been high.  She did note to me about the blood pressure being high but denied any significant shortness of breath.  Patient was seen for this by her primary care provider yesterday.  Patient did take an aspirin today.  Patient's past medical history is significant for new diagnosis of hypertension.  And a strong family history for premature cardiac disease.  No diabetes.  Primary care doctors Dr. Lysbeth Lynch.  Patient not experiencing any chest pain now.     Past Medical History:  Diagnosis Date  . Anxiety   . Degenerative disc disease, lumbar   . Depression   . Family history of adverse reaction to anesthesia    pt's mother has hx. of post-op N/V  . Headache   . Hypothyroidism   . Nasal septal deviation 11/2017  . Nasal turbinate hypertrophy 11/2017  . PONV (postoperative nausea and vomiting)   . Snoring 11/2017    Patient Active Problem List   Diagnosis Date Noted  . Chronic cholecystitis   . Primary insomnia 06/25/2017  . Papanicolaou smear for cervical cancer screening 06/25/2017  . Bilateral hearing loss 06/25/2017  . Acute serous otitis media 06/25/2017  . Lumbar stenosis with neurogenic claudication 09/25/2014    Past Surgical History:  Procedure Laterality Date  . ABDOMINAL HYSTERECTOMY  2005   partial  . ANTERIOR CERVICAL DECOMP/DISCECTOMY FUSION  03/25/2007   C4-5  . CERVICAL FUSION  2013  . CHOLECYSTECTOMY N/A  03/26/2018   Procedure: LAPAROSCOPIC CHOLECYSTECTOMY;  Surgeon: Franky MachoJenkins, Mark, Lynch;  Location: [facility];  Service: General;  Laterality: N/A;  . KNEE ARTHROSCOPY Right   . LUMBAR LAMINECTOMY/DECOMPRESSION MICRODISCECTOMY  11/21/2004   L4-5  . MAXIMUM ACCESS (MAS)POSTERIOR LUMBAR INTERBODY FUSION (PLIF) 1 LEVEL N/A 09/25/2014   Procedure: Lumbar four-five MAXIMUM ACCESS (MAS) POSTERIOR LUMBAR INTERBODY FUSION (PLIF) 1 LEVEL;  Surgeon: Hannah HarmanJoseph Stern, Lynch;  Location: [facility];  Service: Neurosurgery;  Laterality: N/A;  . NASAL SEPTOPLASTY W/ TURBINOPLASTY N/A 12/07/2017   Procedure: NASAL SEPTOPLASTY WITH BILATERAL TURBINATE REDUCTION;  Surgeon: Hannah Lynch, Hannah Lynch;  Location: [facility];  Service: ENT;  Laterality: N/A;  . ORIF ANKLE FRACTURE Right   . TONSILLECTOMY  06/2018  . UPPER GI ENDOSCOPY       OB History   None      Home Medications    Prior to Admission medications   Medication Sig Start Date End Date Taking? Authorizing Provider  citalopram (CELEXA) 20 MG tablet Take 20 mg by mouth daily.    Provider, Historical, Lynch  Cyanocobalamin (VITAMIN B 12 PO) Take by mouth.    Provider, Historical, Lynch  doxepin (SINEQUAN) 25 MG capsule Take 1 capsule by mouth at bedtime. 03/10/18 03/10/19  Provider, Historical, Lynch  doxycycline (VIBRAMYCIN) 100 MG capsule Take 1 capsule (100 mg total) by mouth 2 (two) times daily. Patient not taking:  Reported on 05/12/2018 04/04/18   Devoria Albe, Lynch  gabapentin (NEURONTIN) 300 MG capsule Take 300 mg by mouth 3 (three) times daily.    Provider, Historical, Lynch  HYDROcodone-acetaminophen (NORCO) 5-325 MG tablet Take 1 tablet by mouth every 4 (four) hours as needed for moderate pain. Patient not taking: Reported on 05/12/2018 03/26/18   Franky Macho, Lynch  levothyroxine (SYNTHROID, LEVOTHROID) 25 MCG tablet Take 25 mcg by mouth daily.  01/23/17 03/26/18  Provider, Historical, Lynch  levothyroxine (SYNTHROID, LEVOTHROID) 25 MCG tablet Take 25 mcg by mouth daily.  04/16/18   Provider, Historical, Lynch  LORazepam (ATIVAN) 0.5 MG tablet Take 1 tablet by mouth as needed. 07/09/17   Provider, Historical, Lynch  methocarbamol (ROBAXIN) 500 MG tablet Take 1 tablet (500 mg total) by mouth 2 (two) times daily. 08/30/18   Maczis, Elmer Sow, PA-C  naproxen (NAPROSYN) 500 MG tablet Take 1 tablet (500 mg total) by mouth 2 (two) times daily. 08/30/18   Maczis, Elmer Sow, PA-C  ondansetron (ZOFRAN ODT) 4 MG disintegrating tablet 4mg  ODT q4 hours prn nausea/vomit Patient not taking: Reported on 05/12/2018 03/14/18   Hannah Berkshire, Lynch  SUMAtriptan (IMITREX) 50 MG tablet Take 1 tablet by mouth as needed. 10/14/17   Provider, Historical, Lynch  terbinafine (LAMISIL) 250 MG tablet Take 250 mg by mouth daily.    Provider, Historical, Lynch  traMADol (ULTRAM) 50 MG tablet Take 2 tablets (100 mg total) by mouth every 6 (six) hours as needed. Patient not taking: Reported on 05/12/2018 04/04/18   Devoria Albe, Lynch  zolpidem (AMBIEN) 10 MG tablet Take 1 tablet by mouth as needed. 10/09/17   Provider, Historical, Lynch    Family History Family History  Problem Relation Age of Onset  . Hypertension Mother   . Diabetes Mother   . Heart failure Father   . Hypertension Father   . Diabetes Father   . Heart failure Other     Social History Social History   Tobacco Use  . Smoking status: Never Smoker  . Smokeless tobacco: Never Used  Substance Use Topics  . Alcohol use: No    Frequency: Never  . Drug use: No     Allergies   Patient has no known allergies.   Review of Systems Review of Systems  Constitutional: Negative for diaphoresis and fever.  HENT: Negative for congestion.   Eyes: Negative for visual disturbance.  Respiratory: Negative for shortness of breath.   Cardiovascular: Positive for chest pain. Negative for palpitations and leg swelling.  Gastrointestinal: Negative for abdominal pain, nausea and vomiting.  Genitourinary: Negative for dysuria.  Musculoskeletal: Negative for  neck pain.  Skin: Negative for rash.  Neurological: Negative for syncope and light-headedness.  Hematological: Does not bruise/bleed easily.  Psychiatric/Behavioral: Negative for confusion.     Physical Exam Updated Vital Signs BP 120/83   Pulse 78   Temp 98.2 F (36.8 C)   Resp 16   Ht 1.651 m (5\' 5" )   Wt 99.8 kg   SpO2 100%   BMI 36.61 kg/m   Physical Exam  Constitutional: She is oriented to person, place, and time. She appears well-developed and well-nourished. No distress.  HENT:  Head: Normocephalic and atraumatic.  Mouth/Throat: Oropharynx is clear and moist.  Eyes: Pupils are equal, round, and reactive to light. Conjunctivae and EOM are normal.  Neck: Normal range of motion. Neck supple.  Cardiovascular: Normal rate, regular rhythm and normal heart sounds.  Pulmonary/Chest: Effort normal and breath sounds normal. No respiratory  distress.  Abdominal: Soft. Bowel sounds are normal. There is no tenderness.  Musculoskeletal: Normal range of motion. She exhibits no edema.  Neurological: She is alert and oriented to person, place, and time. No cranial nerve deficit or sensory deficit. She exhibits normal muscle tone.  Skin: Skin is warm. Capillary refill takes less than 2 seconds.  Nursing note and vitals reviewed.    ED Treatments / Results  Labs (all labs ordered are listed, but only abnormal results are displayed) Labs Reviewed  BASIC METABOLIC PANEL  CBC  TROPONIN I    EKG EKG Interpretation  Date/Time:  Tuesday September 28 2018 20:52:39 EST Ventricular Rate:  85 PR Interval:  126 QRS Duration: 80 QT Interval:  376 QTC Calculation: 447 R Axis:   15 Text Interpretation:  Normal sinus rhythm Low voltage QRS Borderline ECG Confirmed by Vanetta Mulders [phone]) on 09/28/2018 10:31:45 PM   Radiology Dg Chest 2 View  Result Date: 09/28/2018 CLINICAL DATA:  Chest pain and shortness of breath.  Cough. EXAM: CHEST - 2 VIEW COMPARISON:  June 30, 2017  FINDINGS: There is slight atelectasis in the left base. The lungs elsewhere are clear. Heart size is upper normal with pulmonary vascularity normal. No adenopathy. There is postoperative change in the lower cervical region. There is degenerative change in the thoracic spine. No pneumothorax. IMPRESSION: Slight left base atelectasis. No edema or consolidation. Stable cardiac silhouette. Electronically Signed   By: Bretta Bang III M.D.   On: 09/28/2018 21:11    Procedures Procedures (including critical care time)  Medications Ordered in ED Medications - No data to display   Initial Impression / Assessment and Plan / ED Course  I have reviewed the triage vital signs and the nursing notes.  Pertinent labs & imaging results that were available during my care of the patient were reviewed by me and considered in my medical decision making (see chart for details).    Patient with 3 days of intermittent chest pain lasting less than 15 minutes.  Not not consistent with myocardial infarction.  But could represent unstable angina picture.  However troponin was negative chest x-ray negative.  Would expect after all this.  Time with a happening as frequently as it is that if it was unstable angina troponin would be positive.  Recommend patient take a baby aspirin a day gave patient referral to cardiology for follow-up due to her family history and back with her primary care doctor.  Patient instructed to return for any chest pain lasting 15 minutes or longer immediately.  In addition EKG without any acute findings.  Blood pressure here initially elevated but this time went on blood pressures were more into a normal range.  Final Clinical Impressions(s) / ED Diagnoses   Final diagnoses:  Precordial pain    ED Discharge Orders    None       Vanetta Mulders, Lynch 09/29/18 1643

## 2018-10-13 ENCOUNTER — Encounter (INDEPENDENT_AMBULATORY_CARE_PROVIDER_SITE_OTHER): Payer: Self-pay | Admitting: Orthopaedic Surgery

## 2018-10-13 ENCOUNTER — Ambulatory Visit (INDEPENDENT_AMBULATORY_CARE_PROVIDER_SITE_OTHER): Payer: BLUE CROSS/BLUE SHIELD | Admitting: Orthopaedic Surgery

## 2018-10-13 ENCOUNTER — Encounter: Payer: Self-pay | Admitting: Cardiovascular Disease

## 2018-10-13 ENCOUNTER — Ambulatory Visit (INDEPENDENT_AMBULATORY_CARE_PROVIDER_SITE_OTHER): Payer: BLUE CROSS/BLUE SHIELD | Admitting: Cardiovascular Disease

## 2018-10-13 ENCOUNTER — Other Ambulatory Visit (HOSPITAL_COMMUNITY): Payer: Self-pay | Admitting: Family Medicine

## 2018-10-13 VITALS — BP 102/70 | HR 87 | Ht 65.0 in | Wt 224.2 lb

## 2018-10-13 VITALS — BP 108/73 | HR 80 | Ht 65.0 in | Wt 224.0 lb

## 2018-10-13 DIAGNOSIS — M25562 Pain in left knee: Secondary | ICD-10-CM

## 2018-10-13 DIAGNOSIS — Z8249 Family history of ischemic heart disease and other diseases of the circulatory system: Secondary | ICD-10-CM

## 2018-10-13 DIAGNOSIS — R0602 Shortness of breath: Secondary | ICD-10-CM | POA: Diagnosis not present

## 2018-10-13 DIAGNOSIS — R1031 Right lower quadrant pain: Secondary | ICD-10-CM

## 2018-10-13 DIAGNOSIS — R5383 Other fatigue: Secondary | ICD-10-CM

## 2018-10-13 DIAGNOSIS — I10 Essential (primary) hypertension: Secondary | ICD-10-CM

## 2018-10-13 DIAGNOSIS — G8929 Other chronic pain: Secondary | ICD-10-CM

## 2018-10-13 DIAGNOSIS — R110 Nausea: Secondary | ICD-10-CM

## 2018-10-13 DIAGNOSIS — Z9289 Personal history of other medical treatment: Secondary | ICD-10-CM

## 2018-10-13 DIAGNOSIS — R079 Chest pain, unspecified: Secondary | ICD-10-CM | POA: Diagnosis not present

## 2018-10-13 NOTE — Patient Instructions (Signed)
Medication Instructions:  Your physician recommends that you continue on your current medications as directed. Please refer to the Current Medication list given to you today.   Labwork: NONE  Testing/Procedures: Your physician has requested that you have an echocardiogram. Echocardiography is a painless test that uses sound waves to create images of your heart. It provides your doctor with information about the size and shape of your heart and how well your heart's chambers and valves are working. This procedure takes approximately one hour. There are no restrictions for this procedure.  Your physician has requested that you have en exercise stress myoview. For further information please visit [email or web address]. Please follow instruction sheet, as given.    Follow-Up: Your physician recommends that you schedule a follow-up appointment in: 3 MONTHS   Any Other Special Instructions Will Be Listed Below (If Applicable).     If you need a refill on your cardiac medications before your next appointment, please call your pharmacy.

## 2018-10-13 NOTE — Progress Notes (Signed)
CARDIOLOGY CONSULT NOTE  Patient ID: Hannah Lynch MRN: [id number] DOB/AGE: [birth year] 47 y.o.  Admit date: (Not on file) Primary Physician: Joette Catching, MD Referring Physician: Joette Catching, MD  Reason for Consultation: Chest pain  HPI: Hannah Lynch is a 47 y.o. female who is being seen today for the evaluation of chest pain at the request of Joette Catching, MD.   Past medical history includes hypertension, hypothyroidism, and GERD.  She was evaluated for chest pain in the ED on 09/28/2018.  I personally reviewed all relevant documentation, labs, and studies.  Basic metabolic panel, CBC, and troponin were all normal.  Chest x-ray showed slight left basilar atelectasis without edema or consolidation.  I personally reviewed the ECG which demonstrated normal sinus rhythm without ischemic ST segment or T wave abnormalities, nor any arrhythmias.  On the day this occurred, she was sitting and suddenly felt the onset of retrosternal chest pain radiating between her shoulder blades.  She was also short of breath.  She checked her blood pressure at home and it was 222/143.  It radiated to the left inframammary region.  The entire episode lasted 45 to 60 minutes.  It essentially resolved by the time she came to the ED.  She has not had symptoms similar to this prior or since.  She has felt more fatigued over the past year.  She does snore and underwent 2 surgeries for this but was tested for sleep apnea in 2018 and did not require any additional treatment.  She occasionally has palpitations at night.  She denies orthopnea, leg swelling, and paroxysmal nocturnal dyspnea.  She denies a personal history of panic attacks.  She has had nausea but denies vomiting, constipation, and diarrhea.  Family history: Father died of an MI at age 11.  He had hypertension and was a non-smoker.  His son has apparently had 5 heart attacks, the first at age 42.  He is 46 years old.  He also has an  arrhythmia and vasospasm.  Her daughter had syncope and an arrhythmia.  Her brother has valvular heart disease.   No Known Allergies  Current Outpatient Medications  Medication Sig Dispense Refill  . amLODipine (NORVASC) 2.5 MG tablet Take 2.5 mg by mouth daily.  2  . citalopram (CELEXA) 20 MG tablet Take 20 mg by mouth daily.    . fluticasone (FLOVENT HFA) 110 MCG/ACT inhaler Inhale 1 puff into the lungs 2 (two) times daily as needed.    . gabapentin (NEURONTIN) 300 MG capsule Take 300 mg by mouth 3 (three) times daily.    Marland Kitchen levothyroxine (SYNTHROID, LEVOTHROID) 25 MCG tablet Take 25 mcg by mouth daily.     Marland Kitchen LORazepam (ATIVAN) 0.5 MG tablet Take 1 tablet by mouth as needed.    . meloxicam (MOBIC) 15 MG tablet Take 15 mg by mouth daily.    . methocarbamol (ROBAXIN) 500 MG tablet Take 1 tablet (500 mg total) by mouth 2 (two) times daily. 20 tablet 0  . omeprazole (PRILOSEC) 20 MG capsule Take 20 mg by mouth daily.  3  . SUMAtriptan (IMITREX) 50 MG tablet Take 1 tablet by mouth as needed.     No current facility-administered medications for this visit.    Facility-Administered Medications Ordered in Other Visits  Medication Dose Route Frequency Provider Last Rate Last Dose  . lactated ringers infusion    Continuous PRN Jeani Hawking, CRNA        Past Medical  History:  Diagnosis Date  . Anxiety   . Degenerative disc disease, lumbar   . Depression   . Family history of adverse reaction to anesthesia    pt's mother has hx. of post-op N/V  . Headache   . Hypothyroidism   . Nasal septal deviation 11/2017  . Nasal turbinate hypertrophy 11/2017  . PONV (postoperative nausea and vomiting)   . Snoring 11/2017    Past Surgical History:  Procedure Laterality Date  . ABDOMINAL HYSTERECTOMY  2005   partial  . ANTERIOR CERVICAL DECOMP/DISCECTOMY FUSION  03/25/2007   C4-5  . CERVICAL FUSION  2013  . CHOLECYSTECTOMY N/A 03/26/2018   Procedure: LAPAROSCOPIC CHOLECYSTECTOMY;  Surgeon:  Franky Macho, MD;  Location: [facility];  Service: General;  Laterality: N/A;  . KNEE ARTHROSCOPY Right   . LUMBAR LAMINECTOMY/DECOMPRESSION MICRODISCECTOMY  11/21/2004   L4-5  . MAXIMUM ACCESS (MAS)POSTERIOR LUMBAR INTERBODY FUSION (PLIF) 1 LEVEL N/A 09/25/2014   Procedure: Lumbar four-five MAXIMUM ACCESS (MAS) POSTERIOR LUMBAR INTERBODY FUSION (PLIF) 1 LEVEL;  Surgeon: Maeola Harman, MD;  Location: [facility];  Service: Neurosurgery;  Laterality: N/A;  . NASAL SEPTOPLASTY W/ TURBINOPLASTY N/A 12/07/2017   Procedure: NASAL SEPTOPLASTY WITH BILATERAL TURBINATE REDUCTION;  Surgeon: Christia Reading, MD;  Location: [facility];  Service: ENT;  Laterality: N/A;  . ORIF ANKLE FRACTURE Right   . TONSILLECTOMY  06/2018  . UPPER GI ENDOSCOPY      Social History   Socioeconomic History  . Marital status: Married    Spouse name: Not on file  . Number of children: Not on file  . Years of education: Not on file  . Highest education level: Not on file  Occupational History  . Not on file  Social Needs  . Financial resource strain: Not on file  . Food insecurity:    Worry: Not on file    Inability: Not on file  . Transportation needs:    Medical: Not on file    Non-medical: Not on file  Tobacco Use  . Smoking status: Never Smoker  . Smokeless tobacco: Never Used  Substance and Sexual Activity  . Alcohol use: No    Frequency: Never  . Drug use: No  . Sexual activity: Yes    Birth control/protection: None  Lifestyle  . Physical activity:    Days per week: Not on file    Minutes per session: Not on file  . Stress: Not on file  Relationships  . Social connections:    Talks on phone: Not on file    Gets together: Not on file    Attends religious service: Not on file    Active member of club or organization: Not on file    Attends meetings of clubs or organizations: Not on file    Relationship status: Not on file  . Intimate partner violence:    Fear of current or ex  partner: Not on file    Emotionally abused: Not on file    Physically abused: Not on file    Forced sexual activity: Not on file  Other Topics Concern  . Not on file  Social History Narrative  . Not on file      Current Meds  Medication Sig  . amLODipine (NORVASC) 2.5 MG tablet Take 2.5 mg by mouth daily.  . citalopram (CELEXA) 20 MG tablet Take 20 mg by mouth daily.  . fluticasone (FLOVENT HFA) 110 MCG/ACT inhaler Inhale 1 puff into the lungs 2 (two) times  daily as needed.  . gabapentin (NEURONTIN) 300 MG capsule Take 300 mg by mouth 3 (three) times daily.  Marland Kitchen. levothyroxine (SYNTHROID, LEVOTHROID) 25 MCG tablet Take 25 mcg by mouth daily.   Marland Kitchen. LORazepam (ATIVAN) 0.5 MG tablet Take 1 tablet by mouth as needed.  . meloxicam (MOBIC) 15 MG tablet Take 15 mg by mouth daily.  . methocarbamol (ROBAXIN) 500 MG tablet Take 1 tablet (500 mg total) by mouth 2 (two) times daily.  Marland Kitchen. omeprazole (PRILOSEC) 20 MG capsule Take 20 mg by mouth daily.  . SUMAtriptan (IMITREX) 50 MG tablet Take 1 tablet by mouth as needed.      Review of systems complete and found to be negative unless listed above in HPI    Physical exam Blood pressure 102/70, pulse 87, height 5\' 5"  (1.651 m), weight 224 lb 3.2 oz (101.7 kg), SpO2 95 %. General: NAD Neck: No JVD, no thyromegaly or thyroid nodule.  Lungs: Clear to auscultation bilaterally with normal respiratory effort. CV: Nondisplaced PMI. Regular rate and rhythm, normal S1/S2, no S3/S4, no murmur.  No peripheral edema.  No carotid bruit.    Abdomen: Soft, nontender, no distention.  Skin: Intact without lesions or rashes.  Neurologic: Alert and oriented x 3.  Psych: Normal affect. Extremities: No clubbing or cyanosis.  HEENT: Normal.   ECG: Most recent ECG reviewed.   Labs: Lab Results  Component Value Date/Time   K 3.9 09/28/2018 09:29 PM   BUN 18 09/28/2018 09:29 PM   CREATININE 0.98 09/28/2018 09:29 PM   ALT 21 03/14/2018 07:17 PM   HGB 12.3  09/28/2018 09:29 PM     Lipids: No results found for: LDLCALC, LDLDIRECT, CHOL, TRIG, HDL      ASSESSMENT AND PLAN:   1.  Chest pain, shortness of breath, and fatigue: There is a strong family history of heart disease.  She has no obvious metabolic abnormalities.  She has no diagnosis of sleep apnea.  It is unclear if the episode she experienced was due to significant hypertension. I will order a 2-D echocardiogram with Doppler to evaluate cardiac structure, function, and regional wall motion. I will also obtain an exercise Myoview stress test to evaluate for ischemic heart disease.  2.  Hypertension: Blood pressures controlled on low-dose amlodipine.  No changes.    Disposition: Follow up in 3 months  Signed: Prentice DockerSuresh Koneswaran, M.D., F.A.C.C.  10/13/2018, 8:23 AM

## 2018-10-13 NOTE — Progress Notes (Signed)
Office Visit Note   Patient: Hannah Lynch           Date of Birth: 26-Mar-1971           MRN: [id number] Visit Date: 10/13/2018              Requested by: Joette Catching, MD [address] [place], [place] [address] PCP: Joette Catching, MD   Assessment & Plan: Visit Diagnoses:  1. Chronic pain of left knee     Plan: Persistent left knee pain predominantly laterally.  Had an MRI scan in August that demonstrated discoid lateral meniscus but without tearing.  Pain is predominant along the lateral compartment of the left knee.  Long discussion regarding treatment choices.  Have discussed arthroscopy and particularly evaluation of the discoid meniscus.  Based on the MRI scan this would be a potential cause of her pain.  She would like to proceed.  She is fully aware of the potential complications and the fact that she may not be any better.  She is having some mechanical symptoms  Follow-Up Instructions: Return will schedue left knee arthroscopy.   Orders:  No orders of the defined types were placed in this encounter.  No orders of the defined types were placed in this encounter.     Procedures: No procedures performed   Clinical Data: No additional findings.   Subjective: Chief Complaint  Patient presents with  . Left Knee - Pain, Follow-up  Patient returns with complaint of recurring left knee pain. She states that the left knee injection she received at her 08/25/18 visit provided no relief at all. She continues to use her brace, ice and meloxicam.  Has been experiencing some popping and clicking but no instability.  Predominant along the lateral aspect of her knee. Has had some chronic issues with her back and is in the midst of obtaining another MRI scan of her is not experiencing any obvious radicular discomfort that might account for her knee pain  HPI  Review of Systems   Objective: Vital Signs: BP 108/73   Pulse 80   Ht 5\' 5"  (1.651 m)   Wt 224 lb (101.6 kg)    BMI 37.28 kg/m   Physical Exam  Constitutional: She is oriented to person, place, and time. She appears well-developed and well-nourished.  HENT:  Mouth/Throat: Oropharynx is clear and moist.  Eyes: Pupils are equal, round, and reactive to light. EOM are normal.  Pulmonary/Chest: Effort normal.  Neurological: She is alert and oriented to person, place, and time.  Skin: Skin is warm and dry.  Psychiatric: She has a normal mood and affect. Her behavior is normal.    Ortho Exam left knee without effusion.  Diffuse lateral joint pain.  Some slight popping with with knee motion.  Full extension of flexion over 100 degrees without instability.  Minimal patella pain.  No pain medially.  No calf discomfort.  No distal edema.  Straight leg raise negative.  Painless range of motion left hip  Specialty Comments:  No specialty comments available.  Imaging: No results found.   PMFS History: Patient Active Problem List   Diagnosis Date Noted  . Chronic cholecystitis   . Primary insomnia 06/25/2017  . Papanicolaou smear for cervical cancer screening 06/25/2017  . Bilateral hearing loss 06/25/2017  . Acute serous otitis media 06/25/2017  . Lumbar stenosis with neurogenic claudication 09/25/2014   Past Medical History:  Diagnosis Date  . Anxiety   . Degenerative disc disease, lumbar   .  Depression   . Family history of adverse reaction to anesthesia    pt's mother has hx. of post-op N/V  . Headache   . Hypothyroidism   . Nasal septal deviation 11/2017  . Nasal turbinate hypertrophy 11/2017  . PONV (postoperative nausea and vomiting)   . Snoring 11/2017    Family History  Problem Relation Age of Onset  . Hypertension Mother   . Diabetes Mother   . Heart failure Father   . Hypertension Father   . Diabetes Father   . Heart failure Other     Past Surgical History:  Procedure Laterality Date  . ABDOMINAL HYSTERECTOMY  2005   partial  . ANTERIOR CERVICAL DECOMP/DISCECTOMY  FUSION  03/25/2007   C4-5  . CERVICAL FUSION  2013  . CHOLECYSTECTOMY N/A 03/26/2018   Procedure: LAPAROSCOPIC CHOLECYSTECTOMY;  Surgeon: Franky MachoJenkins, Mark, MD;  Location: [facility];  Service: General;  Laterality: N/A;  . KNEE ARTHROSCOPY Right   . LUMBAR LAMINECTOMY/DECOMPRESSION MICRODISCECTOMY  11/21/2004   L4-5  . MAXIMUM ACCESS (MAS)POSTERIOR LUMBAR INTERBODY FUSION (PLIF) 1 LEVEL N/A 09/25/2014   Procedure: Lumbar four-five MAXIMUM ACCESS (MAS) POSTERIOR LUMBAR INTERBODY FUSION (PLIF) 1 LEVEL;  Surgeon: Maeola HarmanJoseph Stern, MD;  Location: [facility];  Service: Neurosurgery;  Laterality: N/A;  . NASAL SEPTOPLASTY W/ TURBINOPLASTY N/A 12/07/2017   Procedure: NASAL SEPTOPLASTY WITH BILATERAL TURBINATE REDUCTION;  Surgeon: Christia ReadingBates, Dwight, MD;  Location: [facility];  Service: ENT;  Laterality: N/A;  . ORIF ANKLE FRACTURE Right   . TONSILLECTOMY  06/2018  . UPPER GI ENDOSCOPY     Social History   Occupational History  . Not on file  Tobacco Use  . Smoking status: Never Smoker  . Smokeless tobacco: Never Used  Substance and Sexual Activity  . Alcohol use: No    Frequency: Never  . Drug use: No  . Sexual activity: Yes    Birth control/protection: None

## 2018-10-18 ENCOUNTER — Encounter (HOSPITAL_COMMUNITY)
Admission: RE | Admit: 2018-10-18 | Discharge: 2018-10-18 | Disposition: A | Discharge status: Home / Self Care | Payer: BLUE CROSS/BLUE SHIELD | Source: Ambulatory Visit | Attending: Cardiovascular Disease | Admitting: Cardiovascular Disease

## 2018-10-18 ENCOUNTER — Encounter (HOSPITAL_BASED_OUTPATIENT_CLINIC_OR_DEPARTMENT_OTHER)
Admission: RE | Admit: 2018-10-18 | Discharge: 2018-10-18 | Disposition: A | Discharge status: Home / Self Care | Payer: BLUE CROSS/BLUE SHIELD | Source: Ambulatory Visit | Attending: Cardiovascular Disease | Admitting: Cardiovascular Disease

## 2018-10-18 ENCOUNTER — Ambulatory Visit (HOSPITAL_COMMUNITY)
Admission: RE | Admit: 2018-10-18 | Discharge: 2018-10-18 | Disposition: A | Discharge status: Home / Self Care | Payer: BLUE CROSS/BLUE SHIELD | Source: Ambulatory Visit | Attending: Cardiovascular Disease | Admitting: Cardiovascular Disease

## 2018-10-18 DIAGNOSIS — R079 Chest pain, unspecified: Secondary | ICD-10-CM | POA: Insufficient documentation

## 2018-10-18 DIAGNOSIS — I340 Nonrheumatic mitral (valve) insufficiency: Secondary | ICD-10-CM | POA: Diagnosis not present

## 2018-10-18 DIAGNOSIS — R0602 Shortness of breath: Secondary | ICD-10-CM

## 2018-10-18 LAB — NM MYOCAR MULTI W/SPECT W/WALL MOTION / EF
Estimated workload: 10.1 METS
Exercise duration (min): 7 min
Exercise duration (sec): 32 s
LV dias vol: 82 mL (ref 46–106)
LV sys vol: 23 mL
MPHR: 174 {beats}/min
Peak HR: 164 {beats}/min
Percent HR: 94 %
RATE: 0.47
RPE: 15
Rest HR: 72 {beats}/min
SDS: 2
SRS: 1
SSS: 3
TID: 1.5

## 2018-10-18 MED ORDER — SODIUM CHLORIDE 0.9% FLUSH
INTRAVENOUS | Status: AC
  Administered 2018-10-18: 10 mL via INTRAVENOUS
  Filled 2018-10-18: qty 10

## 2018-10-18 MED ORDER — TECHNETIUM TC 99M TETROFOSMIN IV KIT
30.0000 | PACK | Freq: Once | INTRAVENOUS | Status: AC | PRN
  Administered 2018-10-18: 32.4 via INTRAVENOUS

## 2018-10-18 MED ORDER — TECHNETIUM TC 99M TETROFOSMIN IV KIT
10.0000 | PACK | Freq: Once | INTRAVENOUS | Status: AC | PRN
  Administered 2018-10-18: 10 via INTRAVENOUS

## 2018-10-18 MED ORDER — REGADENOSON 0.4 MG/5ML IV SOLN
INTRAVENOUS | Status: AC
  Filled 2018-10-18: qty 5

## 2018-10-18 NOTE — Progress Notes (Signed)
*  PRELIMINARY RESULTS* Echocardiogram 2D Echocardiogram has been performed.  Jeryl Columbialliott, Johanna 10/18/2018, 9:29 AM

## 2018-10-19 ENCOUNTER — Telehealth: Payer: Self-pay | Admitting: Cardiovascular Disease

## 2018-10-19 NOTE — Telephone Encounter (Signed)
Spoke with pt. She was informed of results.

## 2018-10-19 NOTE — Telephone Encounter (Signed)
Returning call for results  °

## 2018-10-20 ENCOUNTER — Ambulatory Visit (HOSPITAL_COMMUNITY)
Admission: RE | Admit: 2018-10-20 | Discharge: 2018-10-20 | Disposition: A | Discharge status: Home / Self Care | Payer: BLUE CROSS/BLUE SHIELD | Source: Ambulatory Visit | Attending: Family Medicine | Admitting: Family Medicine

## 2018-10-20 DIAGNOSIS — K760 Fatty (change of) liver, not elsewhere classified: Secondary | ICD-10-CM | POA: Diagnosis not present

## 2018-10-20 DIAGNOSIS — R1031 Right lower quadrant pain: Secondary | ICD-10-CM

## 2018-10-20 DIAGNOSIS — Z9049 Acquired absence of other specified parts of digestive tract: Secondary | ICD-10-CM | POA: Insufficient documentation

## 2018-10-20 DIAGNOSIS — R110 Nausea: Secondary | ICD-10-CM

## 2018-10-21 ENCOUNTER — Telehealth (INDEPENDENT_AMBULATORY_CARE_PROVIDER_SITE_OTHER): Payer: Self-pay | Admitting: Orthopaedic Surgery

## 2018-10-21 NOTE — Telephone Encounter (Signed)
Patient would like to know how long she will be out of work.  She is scheduled for [facility].    Pt cb [phone]

## 2018-10-22 NOTE — Telephone Encounter (Signed)
At least several weeks depending on pain, swelling and work activity

## 2018-10-22 NOTE — Telephone Encounter (Signed)
I called patient 

## 2018-10-22 NOTE — Telephone Encounter (Signed)
Please advise 

## 2018-10-28 ENCOUNTER — Encounter: Payer: Self-pay | Admitting: Orthopaedic Surgery

## 2018-10-28 ENCOUNTER — Telehealth (INDEPENDENT_AMBULATORY_CARE_PROVIDER_SITE_OTHER): Payer: Self-pay | Admitting: Orthopaedic Surgery

## 2018-10-28 DIAGNOSIS — S83512A Sprain of anterior cruciate ligament of left knee, initial encounter: Secondary | ICD-10-CM

## 2018-10-28 DIAGNOSIS — M94262 Chondromalacia, left knee: Secondary | ICD-10-CM | POA: Diagnosis not present

## 2018-10-28 DIAGNOSIS — Q686 Discoid meniscus: Secondary | ICD-10-CM

## 2018-10-28 NOTE — Telephone Encounter (Signed)
Hannah SimasJohanna from [facility] [place] called stating they received a prescription for Oxycodone for patient.  They are requesting an addendum be put on the prescription "no more than 6 pills per day" so it will go through her insurance.

## 2018-10-28 NOTE — Telephone Encounter (Signed)
I called Dr. Cleophas DunkerWhitfield directly on his cell phone and advised him of the situation. He gave the verbal okay to put the addendum of "no more than 6 pills per day" on the prescription. I advised the pharmacist and she can now fill the prescription.

## 2018-11-08 ENCOUNTER — Encounter (INDEPENDENT_AMBULATORY_CARE_PROVIDER_SITE_OTHER): Payer: Self-pay | Admitting: Orthopaedic Surgery

## 2018-11-08 ENCOUNTER — Ambulatory Visit (INDEPENDENT_AMBULATORY_CARE_PROVIDER_SITE_OTHER): Payer: BLUE CROSS/BLUE SHIELD | Admitting: Orthopaedic Surgery

## 2018-11-08 VITALS — BP 122/89 | HR 89 | Wt 222.0 lb

## 2018-11-08 DIAGNOSIS — G8929 Other chronic pain: Secondary | ICD-10-CM

## 2018-11-08 DIAGNOSIS — M25562 Pain in left knee: Secondary | ICD-10-CM

## 2018-11-08 NOTE — Progress Notes (Signed)
Office Visit Note   Patient: Hannah Lynch           Date of Birth: 07/02/1971           MRN: [id number] Visit Date: 11/08/2018              Requested by: Joette CatchingNyland, Leonard, MD [address] [place], [place] [id number] PCP: Joette CatchingNyland, Leonard, MD   Assessment & Plan: Visit Diagnoses:  1. Chronic pain of left knee     Plan: 11 days status post left knee arthroscopy.  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  She also had a large discoid meniscus laterally.  I trimmed this back  to a normal meniscal shape.  Doing very well.  Wants to return to work on Friday.  Does not have any the pain she had preoperatively and not having to wake up at night.  Not using any ambulatory aid.  Will work on exercises and plan to check her back in about 1 month or sooner if there is any problem.  She is aware that she may have trouble over time given the areas of chondromalacia.  Microfracture was not performed  Follow-Up Instructions: Return in about 1 month (around 12/09/2018).   Orders:  No orders of the defined types were placed in this encounter.  No orders of the defined types were placed in this encounter.     Procedures: No procedures performed   Clinical Data: No additional findings.   Subjective: No chief complaint on file. 11 days post left knee arthroscopy.  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  No related fever or chills.  Used crutches for 2 days.  Presently doing well without any pain or use of ambulatory aid  HPI  Review of Systems   Objective: Vital Signs: BP 122/89   Pulse 89   Wt 222 lb (100.7 kg)   BMI 36.94 kg/m   Physical Exam  Ortho Exam awake alert and oriented x3.  Comfortable sitting.  Left knee without effusion.  Arthroscopic portals healing without problem.  No  calf pain.  No distal edema.  No lateral joint pain  Specialty Comments:  No specialty comments available.  Imaging: No results found.   PMFS History: Patient Active Problem List   Diagnosis Date Noted  . Chronic cholecystitis   . Primary insomnia 06/25/2017  . Papanicolaou smear for cervical cancer screening 06/25/2017  . Bilateral hearing loss 06/25/2017  . Acute serous otitis media 06/25/2017  . Lumbar stenosis with neurogenic claudication 09/25/2014   Past Medical History:  Diagnosis Date  . Anxiety   . Degenerative disc disease, lumbar   . Depression   . Family history of adverse reaction to anesthesia    pt's mother has hx. of post-op N/V  . Headache   . Hypothyroidism   . Nasal septal deviation 11/2017  . Nasal turbinate hypertrophy 11/2017  . PONV (postoperative nausea and vomiting)   . Snoring 11/2017    Family History  Problem Relation Age of Onset  . Hypertension Mother   . Diabetes Mother   . Heart failure Father   . Hypertension Father   . Diabetes Father   . Heart failure Other     Past Surgical History:  Procedure Laterality Date  . ABDOMINAL HYSTERECTOMY  2005   partial  .  ANTERIOR CERVICAL DECOMP/DISCECTOMY FUSION  03/25/2007   C4-5  . CERVICAL FUSION  2013  . CHOLECYSTECTOMY N/A 03/26/2018   Procedure: LAPAROSCOPIC CHOLECYSTECTOMY;  Surgeon: Franky MachoJenkins, Mark, MD;  Location: [facility];  Service: General;  Laterality: N/A;  . KNEE ARTHROSCOPY Right   . LUMBAR LAMINECTOMY/DECOMPRESSION MICRODISCECTOMY  11/21/2004   L4-5  . MAXIMUM ACCESS (MAS)POSTERIOR LUMBAR INTERBODY FUSION (PLIF) 1 LEVEL N/A 09/25/2014   Procedure: Lumbar four-five MAXIMUM ACCESS (MAS) POSTERIOR LUMBAR INTERBODY FUSION (PLIF) 1 LEVEL;  Surgeon: Maeola HarmanJoseph Stern, MD;  [facility];  Service: Neurosurgery;  Laterality: N/A;  . NASAL SEPTOPLASTY W/ TURBINOPLASTY N/A 12/07/2017   Procedure: NASAL SEPTOPLASTY WITH BILATERAL TURBINATE REDUCTION;  Surgeon: Christia ReadingBates, Dwight, MD;   Location: [facility];  Service: ENT;  Laterality: N/A;  . ORIF ANKLE FRACTURE Right   . TONSILLECTOMY  06/2018  . UPPER GI ENDOSCOPY     Social History   Occupational History  . Not on file  Tobacco Use  . Smoking status: Never Smoker  . Smokeless tobacco: Never Used  Substance and Sexual Activity  . Alcohol use: No    Frequency: Never  . Drug use: No  . Sexual activity: Yes    Birth control/protection: None     Valeria BatmanPeter W Whitfield, MD   Note - This record has been created using [employer] software.  Chart creation errors have been sought, but may not always  have been located. Such creation errors do not reflect on  the standard of medical care.

## 2018-11-08 NOTE — Progress Notes (Signed)
post op knee scope (DOS 10/28/18) and still having a little pain. Denied taking pain for about 1 week.

## 2018-12-08 ENCOUNTER — Ambulatory Visit (INDEPENDENT_AMBULATORY_CARE_PROVIDER_SITE_OTHER): Payer: BLUE CROSS/BLUE SHIELD | Admitting: Orthopaedic Surgery

## 2018-12-08 ENCOUNTER — Encounter (INDEPENDENT_AMBULATORY_CARE_PROVIDER_SITE_OTHER): Payer: Self-pay | Admitting: Orthopaedic Surgery

## 2018-12-08 VITALS — BP 113/74 | HR 94 | Ht 65.0 in | Wt 222.0 lb

## 2018-12-08 DIAGNOSIS — G8929 Other chronic pain: Secondary | ICD-10-CM

## 2018-12-08 DIAGNOSIS — M25562 Pain in left knee: Secondary | ICD-10-CM

## 2018-12-08 NOTE — Progress Notes (Signed)
Office Visit Note   Patient: Hannah Lynch           Date of Birth: [birth year]           MRN: [id number] Visit Date: 12/08/2018              Requested by: Joette Catching, MD [address] [place], [place] [address] PCP: Joette Catching, MD   Assessment & Plan: Visit Diagnoses:  1. Chronic pain of left knee     Plan: Over a month status post left knee arthroscopy.  I debrided areas of chondromalacia in the mid weightbearing surface of the lateral and medial femoral condyles and debrided the lateral discoid meniscus.  Doing very well.  Has been back to work for weeks without any problem.  Will urged her to continue with her exercises and see on a as needed basis  Follow-Up Instructions: Return if symptoms worsen or fail to improve.   Orders:  No orders of the defined types were placed in this encounter.  No orders of the defined types were placed in this encounter.     Procedures: No procedures performed   Clinical Data: No additional findings.   Subjective: Chief Complaint  Patient presents with  . Left Knee - Follow-up    10/28/18 Left Knee Arthroscopy  Patient returns for four week follow up. She is status post left knee arthroscopy on 10/28/18. She states that she is doing well.  HPI  Review of Systems   Objective: Vital Signs: BP 113/74   Pulse 94   Ht 5\' 5"  (1.651 m)   Wt 222 lb (100.7 kg)   BMI 36.94 kg/m   Physical Exam  Ortho Exam left knee was not hot red warm swollen.  No effusion.  Walks without a limp.  No joint pain or patella discomfort  Specialty Comments:  No specialty comments available.  Imaging: No results found.   PMFS History: Patient Active Problem List   Diagnosis Date Noted  . Chronic cholecystitis   . Primary insomnia 06/25/2017  . Papanicolaou smear for cervical cancer screening 06/25/2017  . Bilateral hearing loss 06/25/2017  . Acute serous otitis media 06/25/2017  . Lumbar stenosis with neurogenic claudication  09/25/2014   Past Medical History:  Diagnosis Date  . Anxiety   . Degenerative disc disease, lumbar   . Depression   . Family history of adverse reaction to anesthesia    pt's mother has hx. of post-op N/V  . Headache   . Hypothyroidism   . Nasal septal deviation 11/2017  . Nasal turbinate hypertrophy 11/2017  . PONV (postoperative nausea and vomiting)   . Snoring 11/2017    Family History  Problem Relation Age of Onset  . Hypertension Mother   . Diabetes Mother   . Heart failure Father   . Hypertension Father   . Diabetes Father   . Heart failure Other     Past Surgical History:  Procedure Laterality Date  . ABDOMINAL HYSTERECTOMY  2005   partial  . ANTERIOR CERVICAL DECOMP/DISCECTOMY FUSION  03/25/2007   C4-5  . CERVICAL FUSION  2013  . CHOLECYSTECTOMY N/A 03/26/2018   Procedure: LAPAROSCOPIC CHOLECYSTECTOMY;  Surgeon: Franky Macho, MD;  Location: [facility];  Service: General;  Laterality: N/A;  . KNEE ARTHROSCOPY Right   . LUMBAR LAMINECTOMY/DECOMPRESSION MICRODISCECTOMY  11/21/2004   L4-5  . MAXIMUM ACCESS (MAS)POSTERIOR LUMBAR INTERBODY FUSION (PLIF) 1 LEVEL N/A 09/25/2014   Procedure: Lumbar four-five MAXIMUM ACCESS (MAS) POSTERIOR LUMBAR INTERBODY FUSION (  PLIF) 1 LEVEL;  Surgeon: Maeola Harman, MD;  [facility]: [facility];  Service: Neurosurgery;  Laterality: N/A;  . NASAL SEPTOPLASTY W/ TURBINOPLASTY N/A 12/07/2017   Procedure: NASAL SEPTOPLASTY WITH BILATERAL TURBINATE REDUCTION;  Surgeon: Christia Reading, MD;  [facility];  Service: ENT;  Laterality: N/A;  . ORIF ANKLE FRACTURE Right   . TONSILLECTOMY  06/2018  . UPPER GI ENDOSCOPY     Social History   Occupational History  . Not on file  Tobacco Use  . Smoking status: Never Smoker  . Smokeless tobacco: Never Used  Substance and Sexual Activity  . Alcohol use: No    Frequency: Never  . Drug use: No  . Sexual activity: Yes    Birth control/protection: None

## 2018-12-10 ENCOUNTER — Other Ambulatory Visit: Payer: Self-pay | Admitting: Family Medicine

## 2018-12-10 DIAGNOSIS — Z1231 Encounter for screening mammogram for malignant neoplasm of breast: Secondary | ICD-10-CM

## 2018-12-14 ENCOUNTER — Other Ambulatory Visit: Payer: Self-pay

## 2018-12-14 ENCOUNTER — Encounter (HOSPITAL_COMMUNITY): Payer: Self-pay

## 2018-12-14 ENCOUNTER — Emergency Department (HOSPITAL_COMMUNITY): Payer: BLUE CROSS/BLUE SHIELD

## 2018-12-14 ENCOUNTER — Emergency Department (HOSPITAL_COMMUNITY)
Admission: EM | Admit: 2018-12-14 | Discharge: 2018-12-15 | Disposition: A | Discharge status: Home / Self Care | Payer: BLUE CROSS/BLUE SHIELD | Attending: Emergency Medicine | Admitting: Emergency Medicine

## 2018-12-14 DIAGNOSIS — Z79899 Other long term (current) drug therapy: Secondary | ICD-10-CM | POA: Diagnosis not present

## 2018-12-14 DIAGNOSIS — B9689 Other specified bacterial agents as the cause of diseases classified elsewhere: Secondary | ICD-10-CM | POA: Diagnosis not present

## 2018-12-14 DIAGNOSIS — R1011 Right upper quadrant pain: Secondary | ICD-10-CM | POA: Diagnosis present

## 2018-12-14 DIAGNOSIS — E039 Hypothyroidism, unspecified: Secondary | ICD-10-CM | POA: Insufficient documentation

## 2018-12-14 DIAGNOSIS — R109 Unspecified abdominal pain: Secondary | ICD-10-CM

## 2018-12-14 DIAGNOSIS — N760 Acute vaginitis: Secondary | ICD-10-CM | POA: Insufficient documentation

## 2018-12-14 LAB — COMPREHENSIVE METABOLIC PANEL WITH GFR
ALT: 20 U/L (ref 0–44)
AST: 20 U/L (ref 15–41)
Albumin: 4.1 g/dL (ref 3.5–5.0)
Alkaline Phosphatase: 72 U/L (ref 38–126)
Anion gap: 6 (ref 5–15)
BUN: 12 mg/dL (ref 6–20)
CO2: 26 mmol/L (ref 22–32)
Calcium: 8.6 mg/dL — ABNORMAL LOW (ref 8.9–10.3)
Chloride: 101 mmol/L (ref 98–111)
Creatinine, Ser: 0.99 mg/dL (ref 0.44–1.00)
GFR calc Af Amer: >60 mL/min
GFR calc non Af Amer: >60 mL/min
Glucose, Bld: 100 mg/dL — ABNORMAL HIGH (ref 70–99)
Potassium: 3.6 mmol/L (ref 3.5–5.1)
Sodium: 133 mmol/L — ABNORMAL LOW (ref 135–145)
Total Bilirubin: 0.4 mg/dL (ref 0.3–1.2)
Total Protein: 7.7 g/dL (ref 6.5–8.1)

## 2018-12-14 LAB — LIPASE, BLOOD: Lipase: 33 U/L (ref 11–51)

## 2018-12-14 LAB — CBC
HCT: 39.2 % (ref 36.0–46.0)
Hemoglobin: 12.7 g/dL (ref 12.0–15.0)
MCH: 29.3 pg (ref 26.0–34.0)
MCHC: 32.4 g/dL (ref 30.0–36.0)
MCV: 90.5 fL (ref 80.0–100.0)
Platelets: 293 10*3/uL (ref 150–400)
RBC: 4.33 MIL/uL (ref 3.87–5.11)
RDW: 14 % (ref 11.5–15.5)
WBC: 7.5 10*3/uL (ref 4.0–10.5)
nRBC: 0 % (ref 0.0–0.2)

## 2018-12-14 LAB — URINALYSIS, ROUTINE W REFLEX MICROSCOPIC
Bilirubin Urine: NEGATIVE
Glucose, UA: NEGATIVE mg/dL
Hgb urine dipstick: NEGATIVE
Ketones, ur: NEGATIVE mg/dL
Leukocytes, UA: NEGATIVE
Nitrite: NEGATIVE
Protein, ur: NEGATIVE mg/dL
Specific Gravity, Urine: 1.013 (ref 1.005–1.030)
pH: 6 (ref 5.0–8.0)

## 2018-12-14 LAB — PREGNANCY, URINE: Preg Test, Ur: NEGATIVE

## 2018-12-14 MED ORDER — SODIUM CHLORIDE 0.9 % IV BOLUS
1000.0000 mL | Freq: Once | INTRAVENOUS | Status: AC
  Administered 2018-12-15: 1000 mL via INTRAVENOUS

## 2018-12-14 MED ORDER — FENTANYL CITRATE (PF) 100 MCG/2ML IJ SOLN
50.0000 ug | Freq: Once | INTRAMUSCULAR | Status: AC
  Administered 2018-12-15: 50 ug via INTRAVENOUS
  Filled 2018-12-14: qty 2

## 2018-12-14 MED ORDER — ONDANSETRON HCL 4 MG/2ML IJ SOLN
4.0000 mg | Freq: Once | INTRAMUSCULAR | Status: AC
  Administered 2018-12-15: 4 mg via INTRAVENOUS
  Filled 2018-12-14: qty 2

## 2018-12-14 MED ORDER — IOHEXOL 300 MG/ML  SOLN
100.0000 mL | Freq: Once | INTRAMUSCULAR | Status: AC | PRN
  Administered 2018-12-15: 100 mL via INTRAVENOUS

## 2018-12-14 NOTE — ED Triage Notes (Signed)
Pt reports right sided abd pain that started a week ago, pt was seen at [facility] for this, abd xray and blood work were normal per pt, pt says they "thought it might be constipation but xray did not show that".  Pt saw her PCP yesterday and they instructed pt to f/u with GI doctor and recommended CT scan. Pt also reports nausea and vomiting. Pt reports frequent urination.

## 2018-12-15 ENCOUNTER — Telehealth: Payer: Self-pay | Admitting: Gastroenterology

## 2018-12-15 LAB — WET PREP, GENITAL
Sperm: NONE SEEN
Trich, Wet Prep: NONE SEEN
WBC, Wet Prep HPF POC: NONE SEEN
Yeast Wet Prep HPF POC: NONE SEEN

## 2018-12-15 MED ORDER — KETOROLAC TROMETHAMINE 30 MG/ML IJ SOLN
30.0000 mg | Freq: Once | INTRAMUSCULAR | Status: AC
  Administered 2018-12-15: 30 mg via INTRAVENOUS
  Filled 2018-12-15: qty 1

## 2018-12-15 MED ORDER — METRONIDAZOLE 500 MG PO TABS: 500.0000 mg | ORAL_TABLET | Freq: Two times a day (BID) | ORAL | 0 refills | Status: DC

## 2018-12-15 MED ORDER — DICYCLOMINE HCL 10 MG/ML IM SOLN
20.0000 mg | Freq: Once | INTRAMUSCULAR | Status: AC
  Administered 2018-12-15: 20 mg via INTRAMUSCULAR
  Filled 2018-12-15: qty 2

## 2018-12-15 NOTE — ED Notes (Signed)
Patient transported to CT 

## 2018-12-15 NOTE — ED Notes (Signed)
ED Provider at bedside. 

## 2018-12-15 NOTE — ED Provider Notes (Signed)
[facility] EMERGENCY DEPARTMENT Provider Note   CSN: [id number] Arrival date & time: 12/14/18  2052     History   Chief Complaint Chief Complaint  Patient presents with  . Abdominal Pain    HPI [facility] is a 48 y.o. female.  Patient with progressively worsening right-sided abdominal pain for the past week.  Reports he has had also episodes of nausea and vomiting for the past several days about 3-4 times daily.  Reports fever to 100.6 today.  Has had some loose stools as well.  Was seen at outside hospital last week for this pain had a negative x-ray and blood work.  Saw her PCP yesterday and was told she needed to have a CT scan as well as see a gastroenterologist.  Previous cholecystectomy as well as hysterectomy but still has ovaries.  No pain with urination or blood in the urine.  No vaginal bleeding or discharge.  No chest pain or shortness of breath.  Right-sided abdominal pain is progressively worsening and worse with palpation.  Has had decreased appetite and activity level for the past several days.  Reports this pain is dissimilar to when she was seen in the ED last year.  The history is provided by the patient.  Abdominal Pain  Associated symptoms: diarrhea, nausea and vomiting   Associated symptoms: no chest pain, no cough, no dysuria, no hematuria, no shortness of breath, no vaginal bleeding and no vaginal discharge     Past Medical History:  Diagnosis Date  . Anxiety   . Degenerative disc disease, lumbar   . Depression   . Family history of adverse reaction to anesthesia    pt's mother has hx. of post-op N/V  . Headache   . Hypothyroidism   . Nasal septal deviation 11/2017  . Nasal turbinate hypertrophy 11/2017  . PONV (postoperative nausea and vomiting)   . Snoring 11/2017    Patient Active Problem List   Diagnosis Date Noted  . Chronic cholecystitis   . Primary insomnia 06/25/2017  . Papanicolaou smear for cervical cancer screening 06/25/2017  .  Bilateral hearing loss 06/25/2017  . Acute serous otitis media 06/25/2017  . Lumbar stenosis with neurogenic claudication 09/25/2014    Past Surgical History:  Procedure Laterality Date  . ABDOMINAL HYSTERECTOMY  2005   partial  . ANTERIOR CERVICAL DECOMP/DISCECTOMY FUSION  03/25/2007   C4-5  . CERVICAL FUSION  2013  . CHOLECYSTECTOMY N/A 03/26/2018   Procedure: LAPAROSCOPIC CHOLECYSTECTOMY;  Surgeon: Franky Macho, MD;  Location: [facility];  Service: General;  Laterality: N/A;  . KNEE ARTHROSCOPY Right   . LUMBAR LAMINECTOMY/DECOMPRESSION MICRODISCECTOMY  11/21/2004   L4-5  . MAXIMUM ACCESS (MAS)POSTERIOR LUMBAR INTERBODY FUSION (PLIF) 1 LEVEL N/A 09/25/2014   Procedure: Lumbar four-five MAXIMUM ACCESS (MAS) POSTERIOR LUMBAR INTERBODY FUSION (PLIF) 1 LEVEL;  Surgeon: Maeola Harman, MD;  Location: [facility];  Service: Neurosurgery;  Laterality: N/A;  . NASAL SEPTOPLASTY W/ TURBINOPLASTY N/A 12/07/2017   Procedure: NASAL SEPTOPLASTY WITH BILATERAL TURBINATE REDUCTION;  Surgeon: Christia Reading, MD;  Location: [facility];  Service: ENT;  Laterality: N/A;  . ORIF ANKLE FRACTURE Right   . TONSILLECTOMY  06/2018  . UPPER GI ENDOSCOPY       OB History   No obstetric history on file.      Home Medications    Prior to Admission medications   Medication Sig Start Date End Date Taking? Authorizing Provider  acetaminophen (TYLENOL) 500 MG tablet Take 500 mg  by mouth every 6 (six) hours as needed for mild pain or moderate pain.   Yes [provider]  amLODipine (NORVASC) 2.5 MG tablet Take 2.5 mg by mouth daily. 09/27/18  Yes [provider]  citalopram (CELEXA) 20 MG tablet Take 20 mg by mouth daily.   Yes [provider]  levothyroxine (SYNTHROID, LEVOTHROID) 25 MCG tablet Take 25 mcg by mouth daily.  01/23/17 12/14/18 Yes [provider]  meloxicam (MOBIC) 15 MG tablet Take 15 mg by mouth daily.   Yes [provider]  omeprazole  (PRILOSEC) 20 MG capsule Take 20 mg by mouth daily. 09/08/18  Yes [provider]  ondansetron (ZOFRAN) 4 MG tablet Take 4-8 mg by mouth daily as needed for nausea or vomiting.   Yes [provider]  SUMAtriptan (IMITREX) 50 MG tablet Take 1 tablet by mouth as needed for migraine.  10/14/17   [provider]    Family History Family History  Problem Relation Age of Onset  . Hypertension Mother   . Diabetes Mother   . Heart failure Father   . Hypertension Father   . Diabetes Father   . Heart failure Other     Social History Social History   Tobacco Use  . Smoking status: Never Smoker  . Smokeless tobacco: Never Used  Substance Use Topics  . Alcohol use: No    Frequency: Never  . Drug use: No     Allergies   Patient has no known allergies.   Review of Systems Review of Systems  Constitutional: Positive for activity change and appetite change.  HENT: Negative for congestion and rhinorrhea.   Eyes: Negative for visual disturbance.  Respiratory: Negative for cough and shortness of breath.   Cardiovascular: Negative for chest pain.  Gastrointestinal: Positive for abdominal pain, diarrhea, nausea and vomiting.  Genitourinary: Negative for dysuria, hematuria, vaginal bleeding and vaginal discharge.  Musculoskeletal: Negative for arthralgias, back pain and myalgias.  Skin: Negative for rash.  Neurological: Negative for dizziness, weakness and headaches.   all other systems are negative except as noted in the HPI and PMH.     Physical Exam Updated Vital Signs BP 136/89 (BP Location: Left Arm)   Pulse 81   Temp 97.6 F (36.4 C) (Oral)   Resp 18   Ht 5\' 5"  (1.651 m)   Wt 100.7 kg   SpO2 98%   BMI 36.94 kg/m   Physical Exam Vitals signs and nursing note reviewed.  Constitutional:      General: She is not in acute distress.    Appearance: She is well-developed.  HENT:     Head: Normocephalic and atraumatic.     Mouth/Throat:      Pharynx: No oropharyngeal exudate.  Eyes:     Conjunctiva/sclera: Conjunctivae normal.     Pupils: Pupils are equal, round, and reactive to light.  Neck:     Musculoskeletal: Normal range of motion and neck supple.     Comments: No meningismus. Cardiovascular:     Rate and Rhythm: Normal rate and regular rhythm.     Heart sounds: Normal heart sounds. No murmur.  Pulmonary:     Effort: Pulmonary effort is normal. No respiratory distress.     Breath sounds: Normal breath sounds.  Abdominal:     Palpations: Abdomen is soft.     Tenderness: There is abdominal tenderness. There is no guarding or rebound.     Comments: TTP RUQ and RLQ with voluntary guarding  Genitourinary:  Comments:  Chaperone present.  Normal external genitalia.  No vaginal discharge.  No CMT.  There is RLQ tenderness but no apparent R adnexal tenderness. No L sided tenderness Musculoskeletal: Normal range of motion.        General: No tenderness.     Comments: No CVAT  Skin:    General: Skin is warm.     Capillary Refill: Capillary refill takes less than 2 seconds.  Neurological:     General: No focal deficit present.     Mental Status: She is alert and oriented to person, place, and time. Mental status is at baseline.     Cranial Nerves: No cranial nerve deficit.     Motor: No abnormal muscle tone.     Coordination: Coordination normal.     Comments: No ataxia on finger to nose bilaterally. No pronator drift. 5/5 strength throughout. CN 2-12 intact.Equal grip strength. Sensation intact.   Psychiatric:        Behavior: Behavior normal.      ED Treatments / Results  Labs (all labs ordered are listed, but only abnormal results are displayed) Labs Reviewed  WET PREP, GENITAL - Abnormal; Notable for the following components:      Result Value   Clue Cells Wet Prep HPF POC PRESENT (*)    All other components within normal limits  COMPREHENSIVE METABOLIC PANEL - Abnormal; Notable for the following components:     Sodium 133 (*)    Glucose, Bld 100 (*)    Calcium 8.6 (*)    All other components within normal limits  URINALYSIS, ROUTINE W REFLEX MICROSCOPIC - Abnormal; Notable for the following components:   APPearance HAZY (*)    All other components within normal limits  LIPASE, BLOOD  CBC  PREGNANCY, URINE  GC/CHLAMYDIA PROBE AMP ([place]) NOT AT [facility]    EKG None  Radiology Ct Abdomen Pelvis W Contrast  Result Date: 12/15/2018 CLINICAL DATA:  Right upper quadrant pain EXAM: CT ABDOMEN AND PELVIS WITH CONTRAST TECHNIQUE: Multidetector CT imaging of the abdomen and pelvis was performed using the standard protocol following bolus administration of intravenous contrast. CONTRAST:  OMNIPAQUE IOHEXOL 300 MG/ML  SOLN COMPARISON:  04/04/2018 FINDINGS: Lower chest: No acute abnormality. Hepatobiliary: No focal liver abnormality is seen. Status post cholecystectomy. No biliary dilatation. Pancreas: Unremarkable. No pancreatic ductal dilatation or surrounding inflammatory changes. Spleen: Normal in size without focal abnormality. Adrenals/Urinary Tract: Adrenal glands are unremarkable. Kidneys are normal, without renal calculi, focal lesion, or hydronephrosis. Bladder is unremarkable. Stomach/Bowel: Stomach is within normal limits. Appendix appears normal. No evidence of bowel wall thickening, distention, or inflammatory changes. Vascular/Lymphatic: No significant vascular findings are present. No enlarged abdominal or pelvic lymph nodes. Reproductive: Status post hysterectomy. No adnexal masses. Other: Fat containing umbilical hernia.  No abdominopelvic ascites. Musculoskeletal: No acute osseous abnormality. No aggressive osseous lesion. Posterior lumbar interbody fusion at L4-5. IMPRESSION: 1. No acute abdominal or pelvic pathology. Electronically Signed   By: Elige Ko   On: 12/15/2018 01:33    Procedures Procedures (including critical care time)  Medications Ordered in ED Medications   fentaNYL (SUBLIMAZE) injection 50 mcg (has no administration in time range)  ondansetron (ZOFRAN) injection 4 mg (has no administration in time range)  sodium chloride 0.9 % bolus 1,000 mL (has no administration in time range)  iohexol (OMNIPAQUE) 300 MG/ML solution 100 mL (has no administration in time range)     Initial Impression / Assessment and Plan / ED Course  I have reviewed the triage vital signs and the nursing notes.  Pertinent labs & imaging results that were available during my care of the patient were reviewed by me and considered in my medical decision making (see chart for details).    5 days of nausea and vomiting right-sided abdominal pain.  Sent from PCP for CT scan to evaluate for appendicitis.  Previous cholecystectomy.  Labs are reassuring.  Normal urinalysis.  hCG is negative.  Previous records reviewed.  Patient with previous visits for abdominal pain with negative CT scan and pelvic ultrasound.  CT scan today shows normal appendix and no other acute pathology. No evidence of adnexal pathology.  Did have pelvic ultrasound last year that showed no cysts. Ongoing pain for 5 days and patient appears very comfortable.  Low suspicion for ovarian torsion today.  Pelvic exam performed today and is benign.  Will treat bacterial vaginosis.  Patient tolerating p.o. and ambulatory.  She has GI follow-up on February 13. She appears stable for discharge home with symptomatic treatment and PCP follow-up.  Return precautions discussed.  Final Clinical Impressions(s) / ED Diagnoses   Final diagnoses:  Right sided abdominal pain  Bacterial vaginosis    ED Discharge Orders    None       Rancour, Jeannett SeniorStephen, MD 12/15/18 [phone]

## 2018-12-15 NOTE — ED Notes (Signed)
Pt returned from CT Scan 

## 2018-12-15 NOTE — Discharge Instructions (Signed)
Your testing shows a normal urinalysis, blood work and CT scan.  Your appendix is normal.  Follow-up with the gastroenterologist and your primary doctor as scheduled.  Return to the ED if you develop new or worsening symptoms.

## 2018-12-15 NOTE — Telephone Encounter (Signed)
Can we accept her as a new patient? The ER recommended her to follow up with [place].

## 2018-12-16 LAB — GC/CHLAMYDIA PROBE AMP (~~LOC~~) NOT AT ARMC
Chlamydia: NEGATIVE
Neisseria Gonorrhea: NEGATIVE

## 2018-12-28 NOTE — Telephone Encounter (Signed)
Can we accept her as a new patient? °

## 2019-01-03 NOTE — Telephone Encounter (Signed)
She is established with GI in [place], seen 03/2018. Dr. Elsie Amis.

## 2019-01-17 ENCOUNTER — Ambulatory Visit
Admission: RE | Admit: 2019-01-17 | Discharge: 2019-01-17 | Disposition: A | Discharge status: Home / Self Care | Payer: BLUE CROSS/BLUE SHIELD | Source: Ambulatory Visit | Attending: Family Medicine | Admitting: Family Medicine

## 2019-01-17 DIAGNOSIS — Z1231 Encounter for screening mammogram for malignant neoplasm of breast: Secondary | ICD-10-CM

## 2019-01-24 ENCOUNTER — Encounter: Payer: Self-pay | Admitting: Cardiovascular Disease

## 2019-01-24 ENCOUNTER — Ambulatory Visit (INDEPENDENT_AMBULATORY_CARE_PROVIDER_SITE_OTHER): Payer: BLUE CROSS/BLUE SHIELD | Admitting: Cardiovascular Disease

## 2019-01-24 ENCOUNTER — Other Ambulatory Visit: Payer: Self-pay

## 2019-01-24 VITALS — BP 132/88 | HR 85 | Ht 65.0 in | Wt 221.0 lb

## 2019-01-24 DIAGNOSIS — M25476 Effusion, unspecified foot: Secondary | ICD-10-CM

## 2019-01-24 DIAGNOSIS — I10 Essential (primary) hypertension: Secondary | ICD-10-CM

## 2019-01-24 DIAGNOSIS — Z8249 Family history of ischemic heart disease and other diseases of the circulatory system: Secondary | ICD-10-CM | POA: Diagnosis not present

## 2019-01-24 DIAGNOSIS — M25475 Effusion, left foot: Secondary | ICD-10-CM

## 2019-01-24 DIAGNOSIS — M25473 Effusion, unspecified ankle: Secondary | ICD-10-CM

## 2019-01-24 DIAGNOSIS — M25471 Effusion, right ankle: Secondary | ICD-10-CM

## 2019-01-24 DIAGNOSIS — R079 Chest pain, unspecified: Secondary | ICD-10-CM

## 2019-01-24 NOTE — Patient Instructions (Signed)
Medication Instructions:  Your physician recommends that you continue on your current medications as directed. Please refer to the Current Medication list given to you today.  If you need a refill on your cardiac medications before your next appointment, please call your pharmacy.   Lab work: None today If you have labs (blood work) drawn today and your tests are completely normal, you will receive your results only by: . MyChart Message (if you have MyChart) OR . A paper copy in the mail If you have any lab test that is abnormal or we need to change your treatment, we will call you to review the results.  Testing/Procedures: None today  Follow-Up: As needed with Dr.Koneswaran 

## 2019-01-24 NOTE — Progress Notes (Signed)
SUBJECTIVE: The patient returns for follow-up after undergoing cardiovascular testing performed for the evaluation of chest pain.  Nuclear stress test on 10/18/2018 was deemed a low risk study.  There was variable soft tissue attenuation.  Echocardiogram on 10/18/2018 demonstrated normal left ventricular systolic function and regional wall motion, LVEF 60 to 65%, with grade 2 diastolic dysfunction and mild mitral regurgitation.  She was recently diagnosed with Crohn's disease and follows with gastroenterology at [facility].  She currently denies chest pain, palpitations, and shortness of breath.  She has had some mild ankle and feet swelling.  Upon further discussion, it appears to of coincided with initiation of amlodipine.  She denies orthopnea and paroxysmal nocturnal dyspnea.  She has begun walking for exercise.   Family history: Father died of an MI at age 59.  He had hypertension and was a non-smoker.  His son has apparently had 5 heart attacks, the first at age 23.  He is 47 years old.  He also has an arrhythmia and vasospasm.  Her daughter had syncope and an arrhythmia.  Her brother has valvular heart disease.  Review of Systems: As per "subjective", otherwise negative.  No Known Allergies  Current Outpatient Medications  Medication Sig Dispense Refill  . acetaminophen (TYLENOL) 500 MG tablet Take 500 mg by mouth every 6 (six) hours as needed for mild pain or moderate pain.    Marland Kitchen amitriptyline (ELAVIL) 25 MG tablet Take 25 mg by mouth at bedtime.     Marland Kitchen amLODipine (NORVASC) 2.5 MG tablet Take 2.5 mg by mouth daily.  2  . citalopram (CELEXA) 20 MG tablet Take 20 mg by mouth daily.    Marland Kitchen levothyroxine (SYNTHROID, LEVOTHROID) 25 MCG tablet Take 25 mcg by mouth daily.     . meloxicam (MOBIC) 15 MG tablet Take 15 mg by mouth daily.    Marland Kitchen omeprazole (PRILOSEC) 20 MG capsule Take 20 mg by mouth daily.  3  . ondansetron (ZOFRAN) 4 MG tablet Take 4-8 mg by mouth daily as needed for  nausea or vomiting.    . SUMAtriptan (IMITREX) 50 MG tablet Take 1 tablet by mouth as needed for migraine.      No current facility-administered medications for this visit.    Facility-Administered Medications Ordered in Other Visits  Medication Dose Route Frequency Provider Last Rate Last Dose  . lactated ringers infusion    Continuous PRN Jeani Hawking, CRNA        Past Medical History:  Diagnosis Date  . Anxiety   . Degenerative disc disease, lumbar   . Depression   . Family history of adverse reaction to anesthesia    pt's mother has hx. of post-op N/V  . Headache   . Hypothyroidism   . Nasal septal deviation 11/2017  . Nasal turbinate hypertrophy 11/2017  . PONV (postoperative nausea and vomiting)   . Snoring 11/2017    Past Surgical History:  Procedure Laterality Date  . ABDOMINAL HYSTERECTOMY  2005   partial  . ANTERIOR CERVICAL DECOMP/DISCECTOMY FUSION  03/25/2007   C4-5  . CERVICAL FUSION  2013  . CHOLECYSTECTOMY N/A 03/26/2018   Procedure: LAPAROSCOPIC CHOLECYSTECTOMY;  Surgeon: Franky Macho, MD;  Location: [facility];  Service: General;  Laterality: N/A;  . KNEE ARTHROSCOPY Right   . LUMBAR LAMINECTOMY/DECOMPRESSION MICRODISCECTOMY  11/21/2004   L4-5  . MAXIMUM ACCESS (MAS)POSTERIOR LUMBAR INTERBODY FUSION (PLIF) 1 LEVEL N/A 09/25/2014   Procedure: Lumbar four-five MAXIMUM ACCESS (MAS) POSTERIOR LUMBAR INTERBODY FUSION (  PLIF) 1 LEVEL;  Surgeon: Maeola Harman, MD;  Location: [facility];  Service: Neurosurgery;  Laterality: N/A;  . NASAL SEPTOPLASTY W/ TURBINOPLASTY N/A 12/07/2017   Procedure: NASAL SEPTOPLASTY WITH BILATERAL TURBINATE REDUCTION;  Surgeon: Christia Reading, MD;  Location: [facility];  Service: ENT;  Laterality: N/A;  . ORIF ANKLE FRACTURE Right   . TONSILLECTOMY  06/2018  . UPPER GI ENDOSCOPY      Social History   Socioeconomic History  . Marital status: Married    Spouse name: Not on file  . Number of children: Not on file  .  Years of education: Not on file  . Highest education level: Not on file  Occupational History  . Not on file  Social Needs  . Financial resource strain: Not on file  . Food insecurity:    Worry: Not on file    Inability: Not on file  . Transportation needs:    Medical: Not on file    Non-medical: Not on file  Tobacco Use  . Smoking status: Never Smoker  . Smokeless tobacco: Never Used  Substance and Sexual Activity  . Alcohol use: No    Frequency: Never  . Drug use: No  . Sexual activity: Yes    Birth control/protection: None  Lifestyle  . Physical activity:    Days per week: Not on file    Minutes per session: Not on file  . Stress: Not on file  Relationships  . Social connections:    Talks on phone: Not on file    Gets together: Not on file    Attends religious service: Not on file    Active member of club or organization: Not on file    Attends meetings of clubs or organizations: Not on file    Relationship status: Not on file  . Intimate partner violence:    Fear of current or ex partner: Not on file    Emotionally abused: Not on file    Physically abused: Not on file    Forced sexual activity: Not on file  Other Topics Concern  . Not on file  Social History Narrative  . Not on file     Vitals:   01/24/19 0855  BP: 132/88  Pulse: 85  SpO2: 98%  Weight: 221 lb (100.2 kg)  Height:  (1.651 m)    Wt Readings from Last 3 Encounters:  01/24/19 221 lb (100.2 kg)  12/14/18 222 lb (100.7 kg)  12/08/18 222 lb (100.7 kg)     PHYSICAL EXAM General: NAD HEENT: Normal. Neck: No JVD, no thyromegaly. Lungs: Clear to auscultation bilaterally with normal respiratory effort. CV: Regular rate and rhythm, normal S1/S2, no S3/S4, no murmur. No pretibial or periankle edema.     Abdomen: Soft, nontender, no distention.  Neurologic: Alert and oriented.  Psych: Normal affect. Skin: Normal. Musculoskeletal: No gross deformities.    ECG: Reviewed above under  Subjective   Labs: Lab Results  Component Value Date/Time   K 3.6 12/14/2018 09:23 PM   BUN 12 12/14/2018 09:23 PM   CREATININE 0.99 12/14/2018 09:23 PM   ALT 20 12/14/2018 09:23 PM   HGB 12.7 12/14/2018 09:23 PM     Lipids: No results found for: LDLCALC, LDLDIRECT, CHOL, TRIG, HDL     ASSESSMENT AND PLAN: 1.  Chest pain: Low risk nuclear stress test as noted above.  Echocardiogram demonstrated normal left ventricular systolic function and regional wall motion.  No further cardiac testing is indicated  at this time.  If she were to have recurrent symptoms, alternative cardiac imaging strategies would be pursued.  2.  Hypertension: Blood pressure is reasonably controlled.  No changes to therapy.  3.  Bilateral ankle and feet swelling: This may be due to amlodipine as it appears to be temporally related to this.  I have advised her to discontinue it.   Disposition: Follow up as needed   Prentice Docker, M.D., F.A.C.C.

## 2019-01-25 ENCOUNTER — Other Ambulatory Visit: Payer: Self-pay

## 2019-01-25 ENCOUNTER — Emergency Department (HOSPITAL_COMMUNITY)
Admission: EM | Admit: 2019-01-25 | Discharge: 2019-01-25 | Disposition: A | Discharge status: Home / Self Care | Payer: BLUE CROSS/BLUE SHIELD | Attending: Emergency Medicine | Admitting: Emergency Medicine

## 2019-01-25 ENCOUNTER — Encounter (HOSPITAL_COMMUNITY): Payer: Self-pay | Admitting: Emergency Medicine

## 2019-01-25 DIAGNOSIS — R600 Localized edema: Secondary | ICD-10-CM | POA: Diagnosis present

## 2019-01-25 DIAGNOSIS — L03211 Cellulitis of face: Secondary | ICD-10-CM | POA: Diagnosis not present

## 2019-01-25 DIAGNOSIS — Z79899 Other long term (current) drug therapy: Secondary | ICD-10-CM | POA: Diagnosis not present

## 2019-01-25 DIAGNOSIS — E039 Hypothyroidism, unspecified: Secondary | ICD-10-CM | POA: Diagnosis not present

## 2019-01-25 LAB — CBC WITH DIFFERENTIAL/PLATELET
Abs Immature Granulocytes: 0.05 K/uL (ref 0.00–0.07)
Basophils Absolute: 0 K/uL (ref 0.0–0.1)
Basophils Relative: 0 %
Eosinophils Absolute: 0.2 K/uL (ref 0.0–0.5)
Eosinophils Relative: 2 %
HCT: 38.2 % (ref 36.0–46.0)
Hemoglobin: 12.5 g/dL (ref 12.0–15.0)
Immature Granulocytes: 1 %
Lymphocytes Relative: 15 %
Lymphs Abs: 1.6 K/uL (ref 0.7–4.0)
MCH: 30.4 pg (ref 26.0–34.0)
MCHC: 32.7 g/dL (ref 30.0–36.0)
MCV: 92.9 fL (ref 80.0–100.0)
Monocytes Absolute: 0.7 K/uL (ref 0.1–1.0)
Monocytes Relative: 7 %
Neutro Abs: 8.2 K/uL — ABNORMAL HIGH (ref 1.7–7.7)
Neutrophils Relative %: 75 %
Platelets: 235 K/uL (ref 150–400)
RBC: 4.11 MIL/uL (ref 3.87–5.11)
RDW: 13.7 % (ref 11.5–15.5)
WBC: 10.8 K/uL — ABNORMAL HIGH (ref 4.0–10.5)
nRBC: 0 % (ref 0.0–0.2)

## 2019-01-25 LAB — BASIC METABOLIC PANEL
Anion gap: 6 (ref 5–15)
BUN: 12 mg/dL (ref 6–20)
CO2: 24 mmol/L (ref 22–32)
Calcium: 8.8 mg/dL — ABNORMAL LOW (ref 8.9–10.3)
Chloride: 104 mmol/L (ref 98–111)
Creatinine, Ser: 0.73 mg/dL (ref 0.44–1.00)
GFR calc Af Amer: >60 mL/min (ref >60)
GFR calc non Af Amer: >60 mL/min (ref >60)
Glucose, Bld: 103 mg/dL — ABNORMAL HIGH (ref 70–99)
Potassium: 3.8 mmol/L (ref 3.5–5.1)
Sodium: 134 mmol/L — ABNORMAL LOW (ref 135–145)

## 2019-01-25 MED ORDER — MORPHINE SULFATE (PF) 4 MG/ML IV SOLN
4.0000 mg | Freq: Once | INTRAVENOUS | Status: AC
  Administered 2019-01-25: 4 mg via INTRAVENOUS
  Filled 2019-01-25: qty 1

## 2019-01-25 MED ORDER — CLINDAMYCIN HCL 150 MG PO CAPS: 300.0000 mg | ORAL_CAPSULE | Freq: Four times a day (QID) | ORAL | 0 refills | Status: DC

## 2019-01-25 MED ORDER — HYDROCODONE-ACETAMINOPHEN 5-325 MG PO TABS: 1.0000 | ORAL_TABLET | ORAL | 0 refills | Status: DC | PRN

## 2019-01-25 MED ORDER — CLINDAMYCIN PHOSPHATE 600 MG/50ML IV SOLN
600.0000 mg | Freq: Once | INTRAVENOUS | Status: AC
  Administered 2019-01-25: 600 mg via INTRAVENOUS
  Filled 2019-01-25: qty 50

## 2019-01-25 MED ORDER — KETOROLAC TROMETHAMINE 30 MG/ML IJ SOLN
30.0000 mg | Freq: Once | INTRAMUSCULAR | Status: AC
  Administered 2019-01-25: 30 mg via INTRAVENOUS
  Filled 2019-01-25: qty 1

## 2019-01-25 MED ORDER — ONDANSETRON HCL 4 MG/2ML IJ SOLN
4.0000 mg | Freq: Once | INTRAMUSCULAR | Status: AC
  Administered 2019-01-25: 4 mg via INTRAVENOUS
  Filled 2019-01-25: qty 2

## 2019-01-25 MED ORDER — LIDOCAINE-EPINEPHRINE (PF) 2 %-1:200000 IJ SOLN
10.0000 mL | Freq: Once | INTRAMUSCULAR | Status: AC
  Administered 2019-01-25: 10 mL
  Filled 2019-01-25: qty 10

## 2019-01-25 NOTE — ED Provider Notes (Signed)
[facility] EMERGENCY DEPARTMENT Provider Note   CSN: [id number] Arrival date & time: 01/25/19  1053    History   Chief Complaint Chief Complaint  Patient presents with  . Facial Swelling    HPI Hannah Lynch is a 48 y.o. female.     Pt presents to the ED today with swelling and redness to right side of face.  She noticed a pump on Sunday, 3/15.  She saw her PCP yesterday and was put on doxycycline.  She has taken 2 doses of that.  She woke up this morning with swelling to the right face.  She called her PCP who told her to come in today.      Past Medical History:  Diagnosis Date  . Anxiety   . Degenerative disc disease, lumbar   . Depression   . Family history of adverse reaction to anesthesia    pt's mother has hx. of post-op N/V  . Headache   . Hypothyroidism   . Nasal septal deviation 11/2017  . Nasal turbinate hypertrophy 11/2017  . PONV (postoperative nausea and vomiting)   . Snoring 11/2017    Patient Active Problem List   Diagnosis Date Noted  . Chronic cholecystitis   . Primary insomnia 06/25/2017  . Papanicolaou smear for cervical cancer screening 06/25/2017  . Bilateral hearing loss 06/25/2017  . Acute serous otitis media 06/25/2017  . Lumbar stenosis with neurogenic claudication 09/25/2014    Past Surgical History:  Procedure Laterality Date  . ABDOMINAL HYSTERECTOMY  2005   partial  . ANTERIOR CERVICAL DECOMP/DISCECTOMY FUSION  03/25/2007   C4-5  . CERVICAL FUSION  2013  . CHOLECYSTECTOMY N/A 03/26/2018   Procedure: LAPAROSCOPIC CHOLECYSTECTOMY;  Surgeon: Franky Macho, MD;  Location: [facility];  Service: General;  Laterality: N/A;  . KNEE ARTHROSCOPY Right   . LUMBAR LAMINECTOMY/DECOMPRESSION MICRODISCECTOMY  11/21/2004   L4-5  . MAXIMUM ACCESS (MAS)POSTERIOR LUMBAR INTERBODY FUSION (PLIF) 1 LEVEL N/A 09/25/2014   Procedure: Lumbar four-five MAXIMUM ACCESS (MAS) POSTERIOR LUMBAR INTERBODY FUSION (PLIF) 1 LEVEL;  Surgeon: Maeola Harman, MD;   Location: [facility];  Service: Neurosurgery;  Laterality: N/A;  . NASAL SEPTOPLASTY W/ TURBINOPLASTY N/A 12/07/2017   Procedure: NASAL SEPTOPLASTY WITH BILATERAL TURBINATE REDUCTION;  Surgeon: Christia Reading, MD;  Location: [facility];  Service: ENT;  Laterality: N/A;  . ORIF ANKLE FRACTURE Right   . TONSILLECTOMY  06/2018  . UPPER GI ENDOSCOPY       OB History   No obstetric history on file.      Home Medications    Prior to Admission medications   Medication Sig Start Date End Date Taking? Authorizing Provider  acetaminophen (TYLENOL) 500 MG tablet Take 500 mg by mouth every 6 (six) hours as needed for mild pain or moderate pain.    [provider]  amitriptyline (ELAVIL) 25 MG tablet Take 25 mg by mouth at bedtime.  01/20/19   [provider]  amLODipine (NORVASC) 2.5 MG tablet Take 2.5 mg by mouth daily. 09/27/18   [provider]  citalopram (CELEXA) 20 MG tablet Take 20 mg by mouth daily.    [provider]  clindamycin (CLEOCIN) 150 MG capsule Take 2 capsules (300 mg total) by mouth 4 (four) times daily. 01/25/19   Jacalyn Lefevre, MD  HYDROcodone-acetaminophen (NORCO/VICODIN) 5-325 MG tablet Take 1 tablet by mouth every 4 (four) hours as needed. 01/25/19   Jacalyn Lefevre, MD  levothyroxine (SYNTHROID, LEVOTHROID) 25 MCG tablet Take 25  mcg by mouth daily.  01/23/17 01/24/19  [provider]  meloxicam (MOBIC) 15 MG tablet Take 15 mg by mouth daily.    [provider]  omeprazole (PRILOSEC) 20 MG capsule Take 20 mg by mouth daily. 09/08/18   [provider]  ondansetron (ZOFRAN) 4 MG tablet Take 4-8 mg by mouth daily as needed for nausea or vomiting.    [provider]  SUMAtriptan (IMITREX) 50 MG tablet Take 1 tablet by mouth as needed for migraine.  10/14/17   [provider]    Family History Family History  Problem Relation Age of Onset  . Hypertension Mother   . Diabetes Mother    . Heart failure Father   . Hypertension Father   . Diabetes Father   . Heart failure Other   . Breast cancer Paternal Grandmother     Social History Social History   Tobacco Use  . Smoking status: Never Smoker  . Smokeless tobacco: Never Used  Substance Use Topics  . Alcohol use: No    Frequency: Never  . Drug use: No     Allergies   Patient has no known allergies.   Review of Systems Review of Systems  Skin: Positive for rash.  All other systems reviewed and are negative.    Physical Exam Updated Vital Signs BP 127/87   Pulse 99   Temp 97.9 F (36.6 C) (Oral)   Resp 19   Ht 5\' 5"  (1.651 m)   Wt 100.2 kg   SpO2 92%   BMI 36.78 kg/m   Physical Exam Vitals signs and nursing note reviewed.  Constitutional:      Appearance: Normal appearance.  HENT:     Head: Normocephalic and atraumatic.     Comments: Cellulitis to right face.  Small abscess.    Right Ear: External ear normal.     Left Ear: External ear normal.     Nose: Nose normal.     Mouth/Throat:     Mouth: Mucous membranes are moist.  Eyes:     Extraocular Movements: Extraocular movements intact.     Pupils: Pupils are equal, round, and reactive to light.  Neck:     Musculoskeletal: Normal range of motion and neck supple.  Cardiovascular:     Rate and Rhythm: Normal rate and regular rhythm.     Pulses: Normal pulses.     Heart sounds: Normal heart sounds.  Pulmonary:     Effort: Pulmonary effort is normal.     Breath sounds: Normal breath sounds.  Abdominal:     General: Abdomen is flat.  Musculoskeletal: Normal range of motion.  Skin:    General: Skin is warm.     Capillary Refill: Capillary refill takes less than 2 seconds.  Neurological:     General: No focal deficit present.     Mental Status: She is alert and oriented to person, place, and time.  Psychiatric:        Mood and Affect: Mood normal.        Behavior: Behavior normal.      ED Treatments / Results  Labs (all labs  ordered are listed, but only abnormal results are displayed) Labs Reviewed  BASIC METABOLIC PANEL - Abnormal; Notable for the following components:      Result Value   Sodium 134 (*)    Glucose, Bld 103 (*)    Calcium 8.8 (*)    All other components within normal limits  CBC WITH DIFFERENTIAL/PLATELET -  Abnormal; Notable for the following components:   WBC 10.8 (*)    Neutro Abs 8.2 (*)    All other components within normal limits    EKG None  Radiology No results found.  Procedures .Marland KitchenIncision and Drainage Date/Time: 01/25/2019 1:43 PM Performed by: Jacalyn Lefevre, MD Authorized by: Jacalyn Lefevre, MD   Consent:    Consent obtained:  Verbal   Consent given by:  Patient   Risks discussed:  Bleeding and pain   Alternatives discussed:  No treatment Location:    Type:  Abscess   Size:  1   Location:  Head   Head location:  Face Pre-procedure details:    Skin preparation:  Betadine Procedure type:    Complexity:  Simple Procedure details:    Incision types:  Single straight   Scalpel blade:  11   Wound management:  Probed and deloculated   Drainage:  Bloody and purulent   Drainage amount:  Scant   Wound treatment:  Wound left open   Packing materials:  None Post-procedure details:    Patient tolerance of procedure:  Tolerated well, no immediate complications   (including critical care time)  Medications Ordered in ED Medications  morphine 4 MG/ML injection 4 mg (4 mg Intravenous Given 01/25/19 1152)  ondansetron (ZOFRAN) injection 4 mg (4 mg Intravenous Given 01/25/19 1151)  clindamycin (CLEOCIN) IVPB 600 mg ( Intravenous Stopped 01/25/19 1231)  lidocaine-EPINEPHrine (XYLOCAINE W/EPI) 2 %-1:200000 (PF) injection 10 mL (10 mLs Infiltration Given 01/25/19 1157)  ketorolac (TORADOL) 30 MG/ML injection 30 mg (30 mg Intravenous Given 01/25/19 1343)  morphine 4 MG/ML injection 4 mg (4 mg Intravenous Given 01/25/19 1341)     Initial Impression / Assessment and Plan / ED  Course  I have reviewed the triage vital signs and the nursing notes.  Pertinent labs & imaging results that were available during my care of the patient were reviewed by me and considered in my medical decision making (see chart for details).        Pt given IV clinda with some improvement of sx.  I&D yielded scant pus.  Pt to go home with rx for clinda and lortab.  Return if worse.  F/u with pcp.  Final Clinical Impressions(s) / ED Diagnoses   Final diagnoses:  Facial cellulitis    ED Discharge Orders         Ordered    clindamycin (CLEOCIN) 150 MG capsule  4 times daily     01/25/19 1337    HYDROcodone-acetaminophen (NORCO/VICODIN) 5-325 MG tablet  Every 4 hours PRN     01/25/19 1337           Jacalyn Lefevre, MD 01/25/19 1402

## 2019-01-25 NOTE — ED Notes (Signed)
Patient given discharge instruction, verbalized understand. IV removed, band aid applied. Patient ambulatory out of the department.  

## 2019-01-25 NOTE — ED Triage Notes (Signed)
Swelling to RT side of face. Pt had bump that started to RT side of face that she noticed on Sunday.  Seen at pcp yesterday and given abx. Woke up this morning with swelling to majority of RT side of face.  Told to come here by pcp

## 2019-04-13 ENCOUNTER — Other Ambulatory Visit: Payer: Self-pay

## 2019-04-13 ENCOUNTER — Ambulatory Visit (INDEPENDENT_AMBULATORY_CARE_PROVIDER_SITE_OTHER): Payer: BC Managed Care – PPO | Admitting: Orthopaedic Surgery

## 2019-04-13 ENCOUNTER — Ambulatory Visit: Payer: Self-pay

## 2019-04-13 ENCOUNTER — Encounter: Payer: Self-pay | Admitting: Orthopaedic Surgery

## 2019-04-13 VITALS — Ht 65.0 in | Wt 232.0 lb

## 2019-04-13 DIAGNOSIS — M25571 Pain in right ankle and joints of right foot: Secondary | ICD-10-CM | POA: Insufficient documentation

## 2019-04-13 NOTE — Progress Notes (Signed)
Office Visit Note   Patient: Hannah Lynch           Date of Birth: Aug 29, 1971           MRN: [id number] Visit Date: 04/13/2019              Requested by: Joette Catching, MD [address] [place], [place] [address] PCP: Joette Catching, MD   Assessment & Plan: Visit Diagnoses:  1. Pain in right ankle and joints of right foot     Plan: Sprain lateral aspect right ankle without evidence of a fracture on film.  Will apply an ankle support and reevaluate in 3 weeks.  Activity as tolerated  Follow-Up Instructions: Return in about 3 weeks (around 05/04/2019).   Orders:  Orders Placed This Encounter  Procedures  . XR Ankle Complete Right   No orders of the defined types were placed in this encounter.     Procedures: No procedures performed   Clinical Data: No additional findings.   Subjective: Chief Complaint  Patient presents with  . Right Ankle - Pain  Patient presents today with right ankle pain. She was at work one week ago and rolled her right ankle. She has been having pain laterally since with weightbearing. She also has swelling. She does not take anything for pain. She has a history of right ankle ORIF surgery in '93.   HPI  Review of Systems   Objective: Vital Signs: Ht 5\' 5"  (1.651 m)   Wt 232 lb (105.2 kg)   BMI 38.61 kg/m   Physical Exam Constitutional:      Appearance: She is well-developed.  Eyes:     Pupils: Pupils are equal, round, and reactive to light.  Pulmonary:     Effort: Pulmonary effort is normal.  Skin:    General: Skin is warm and dry.  Neurological:     Mental Status: She is alert and oriented to person, place, and time.  Psychiatric:        Behavior: Behavior normal.     Ortho Exam awake alert and oriented x3.  Comfortable sitting.  Right ankle with localized tenderness over the anterior talofibular ligament.  No increased talar tilt or inversion compared to the left ankle.  No pain along the distal fibula or medially.  No  ecchymosis.  Neurologically intact  Specialty Comments:  No specialty comments available.  Imaging: Xr Ankle Complete Right  Result Date: 04/13/2019 Films of the right ankle were obtained in several projections.  There is evidence of an old internal fixation of the distal fibula and the medial malleolus.  The fractures are not visible.  Possibly early arthritis medially with some sclerosis about the joint but the ankle mortise otherwise appears intact.  Soft tissue swelling laterally consistent with sprain.  No evidence of an acute injury or fracture    PMFS History: Patient Active Problem List   Diagnosis Date Noted  . Pain in right ankle and joints of right foot 04/13/2019  . Chronic cholecystitis   . Primary insomnia 06/25/2017  . Papanicolaou smear for cervical cancer screening 06/25/2017  . Bilateral hearing loss 06/25/2017  . Acute serous otitis media 06/25/2017  . Lumbar stenosis with neurogenic claudication 09/25/2014   Past Medical History:  Diagnosis Date  . Anxiety   . Degenerative disc disease, lumbar   . Depression   . Family history of adverse reaction to anesthesia    pt's mother has hx. of post-op N/V  . Headache   .  Hypothyroidism   . Nasal septal deviation 11/2017  . Nasal turbinate hypertrophy 11/2017  . PONV (postoperative nausea and vomiting)   . Snoring 11/2017    Family History  Problem Relation Age of Onset  . Hypertension Mother   . Diabetes Mother   . Heart failure Father   . Hypertension Father   . Diabetes Father   . Heart failure Other   . Breast cancer Paternal Grandmother     Past Surgical History:  Procedure Laterality Date  . ABDOMINAL HYSTERECTOMY  2005   partial  . ANTERIOR CERVICAL DECOMP/DISCECTOMY FUSION  03/25/2007   C4-5  . CERVICAL FUSION  2013  . CHOLECYSTECTOMY N/A 03/26/2018   Procedure: LAPAROSCOPIC CHOLECYSTECTOMY;  Surgeon: Franky MachoJenkins, Mark, MD;  Location: [facility];  Service: General;  Laterality: N/A;  . KNEE ARTHROSCOPY  Right   . LUMBAR LAMINECTOMY/DECOMPRESSION MICRODISCECTOMY  11/21/2004   L4-5  . MAXIMUM ACCESS (MAS)POSTERIOR LUMBAR INTERBODY FUSION (PLIF) 1 LEVEL N/A 09/25/2014   Procedure: Lumbar four-five MAXIMUM ACCESS (MAS) POSTERIOR LUMBAR INTERBODY FUSION (PLIF) 1 LEVEL;  Surgeon: Maeola HarmanJoseph Stern, MD;  Location: [facility];  Service: Neurosurgery;  Laterality: N/A;  . NASAL SEPTOPLASTY W/ TURBINOPLASTY N/A 12/07/2017   Procedure: NASAL SEPTOPLASTY WITH BILATERAL TURBINATE REDUCTION;  Surgeon: Christia ReadingBates, Dwight, MD;  Location: [facility];  Service: ENT;  Laterality: N/A;  . ORIF ANKLE FRACTURE Right   . TONSILLECTOMY  06/2018  . UPPER GI ENDOSCOPY     Social History   Occupational History  . Not on file  Tobacco Use  . Smoking status: Never Smoker  . Smokeless tobacco: Never Used  Substance and Sexual Activity  . Alcohol use: No    Frequency: Never  . Drug use: No  . Sexual activity: Yes    Birth control/protection: None

## 2019-04-19 ENCOUNTER — Encounter: Payer: Self-pay | Admitting: Orthopaedic Surgery

## 2019-04-19 ENCOUNTER — Telehealth (INDEPENDENT_AMBULATORY_CARE_PROVIDER_SITE_OTHER): Payer: BC Managed Care – PPO | Admitting: Orthopaedic Surgery

## 2019-04-19 NOTE — Telephone Encounter (Signed)
Please advise on your recommendations.Marland KitchenMarland Kitchen

## 2019-04-19 NOTE — Telephone Encounter (Signed)
I believe this was to go to you

## 2019-04-19 NOTE — Telephone Encounter (Signed)
Patient left a voicemail stating she had an appointment with Dr. Durward Fortes at the [facility] office on 04/13/19 and was given an ankle brace/wrap.  Patient states she has been wearing the brace, but feels her ankle is getting worse.  Patient requested a return call.

## 2019-04-20 NOTE — Telephone Encounter (Signed)
If ankle support is uncomfortable try equalizer boot

## 2019-04-20 NOTE — Telephone Encounter (Signed)
Patient was responded to through computer because she messaged in that way as well.

## 2019-04-23 ENCOUNTER — Encounter: Payer: Self-pay | Admitting: Orthopaedic Surgery

## 2019-04-25 ENCOUNTER — Other Ambulatory Visit: Payer: Self-pay | Admitting: *Deleted

## 2019-04-25 DIAGNOSIS — M25571 Pain in right ankle and joints of right foot: Secondary | ICD-10-CM | POA: Diagnosis not present

## 2019-04-28 ENCOUNTER — Other Ambulatory Visit: Payer: Self-pay

## 2019-04-28 ENCOUNTER — Encounter: Payer: Self-pay | Admitting: Orthopaedic Surgery

## 2019-04-28 ENCOUNTER — Ambulatory Visit (INDEPENDENT_AMBULATORY_CARE_PROVIDER_SITE_OTHER): Payer: BC Managed Care – PPO | Admitting: Orthopaedic Surgery

## 2019-04-28 VITALS — BP 122/90 | HR 116 | Ht 65.0 in | Wt 232.0 lb

## 2019-04-28 DIAGNOSIS — M25571 Pain in right ankle and joints of right foot: Secondary | ICD-10-CM

## 2019-04-28 NOTE — Progress Notes (Signed)
Office Visit Note   Patient: Hannah Lynch           Date of Birth: 10/01/1971           MRN: [id number] Visit Date: 04/28/2019              Requested by: Joette CatchingNyland, Leonard, MD [address] [place],  [place] [id number] PCP: Joette CatchingNyland, Leonard, MD   Assessment & Plan: Visit Diagnoses:  1. Pain in right ankle and joints of right foot     Plan:  #1: MRI scan of the right ankle to evaluate the Achilles tendon as well as the ankle osseous structures #2: Continue with her equalizer boot  Follow-Up Instructions: Return in about 3 weeks (around 05/19/2019).   Orders:  Orders Placed This Encounter  Procedures  . MR Ankle Right w/o contrast   No orders of the defined types were placed in this encounter.     Procedures: No procedures performed   Clinical Data: No additional findings.   Subjective: Chief Complaint  Patient presents with  . Right Ankle - Follow-up   HPI Patient presents today for follow up on her right ankle. She was here two weeks ago and was placed into an ankle support. The ankle support did not help. She came earlier this week and got an [employer] traderequalizer boot. She said that her ankle is still hurting on the lateral side and into her heel. She also has swelling. She has tried tylenol for pain. [facility].   Review of Systems  Constitutional: Negative for fatigue and fever.  HENT: Negative for ear pain.   Eyes: Negative for pain.  Respiratory: Negative for cough and shortness of breath.   Cardiovascular: Positive for leg swelling.  Gastrointestinal: Negative for constipation and diarrhea.  Genitourinary: Negative for difficulty urinating.  Musculoskeletal: Negative for back pain and neck pain.  Skin: Negative for rash.  Allergic/Immunologic: Negative for food allergies.  Neurological: Positive for weakness. Negative for numbness.  Hematological: Does not bruise/bleed easily.  Psychiatric/Behavioral: Negative for sleep  disturbance.     Objective: Vital Signs: BP 122/90   Pulse (!) 116   Ht 5\' 5"  (1.651 m)   Wt 232 lb (105.2 kg)   BMI 38.61 kg/m   Physical Exam Constitutional:      Appearance: She is well-developed.  Eyes:     Pupils: Pupils are equal, round, and reactive to light.  Pulmonary:     Effort: Pulmonary effort is normal.  Skin:    General: Skin is warm and dry.  Neurological:     Mental Status: She is alert and oriented to person, place, and time.  Psychiatric:        Behavior: Behavior normal.     Ortho Exam  Exam today reveals decreased range of motion in dorsi and  plantar flexion.  She has tenderness to palpation over the calcaneal insertion of the Achilles.  She also has but may be a small partial tear in the Achilles tendon at the musculotendinous junction.   Specialty Comments:  No specialty comments available.  Imaging: No results found.   PMFS History: Current Outpatient Medications  Medication Sig Dispense Refill  . acetaminophen (TYLENOL) 500 MG tablet Take 500 mg by mouth every 6 (six) hours as needed for mild pain or moderate pain.    Marland Kitchen. amitriptyline (ELAVIL) 25 MG tablet Take 25 mg by mouth at bedtime.     Marland Kitchen. amLODipine (NORVASC) 2.5 MG  tablet Take 2.5 mg by mouth daily.  2  . citalopram (CELEXA) 20 MG tablet Take 20 mg by mouth daily.    . clindamycin (CLEOCIN) 150 MG capsule Take 2 capsules (300 mg total) by mouth 4 (four) times daily. 56 capsule 0  . HYDROcodone-acetaminophen (NORCO/VICODIN) 5-325 MG tablet Take 1 tablet by mouth every 4 (four) hours as needed. 10 tablet 0  . meloxicam (MOBIC) 15 MG tablet Take 15 mg by mouth daily.    Marland Kitchen. omeprazole (PRILOSEC) 20 MG capsule Take 20 mg by mouth daily.  3  . ondansetron (ZOFRAN) 4 MG tablet Take 4-8 mg by mouth daily as needed for nausea or vomiting.    . SUMAtriptan (IMITREX) 50 MG tablet Take 1 tablet by mouth as needed for migraine.     Marland Kitchen. levothyroxine (SYNTHROID, LEVOTHROID) 25 MCG tablet Take 25 mcg  by mouth daily.      No current facility-administered medications for this visit.    Facility-Administered Medications Ordered in Other Visits  Medication Dose Route Frequency Provider Last Rate Last Dose  . lactated ringers infusion    Continuous PRN Jeani HawkingBerry, Tabatha J, CRNA        Patient Active Problem List   Diagnosis Date Noted  . Pain in right ankle and joints of right foot 04/13/2019  . Chronic cholecystitis   . Primary insomnia 06/25/2017  . Papanicolaou smear for cervical cancer screening 06/25/2017  . Bilateral hearing loss 06/25/2017  . Acute serous otitis media 06/25/2017  . Lumbar stenosis with neurogenic claudication 09/25/2014   Past Medical History:  Diagnosis Date  . Anxiety   . Degenerative disc disease, lumbar   . Depression   . Family history of adverse reaction to anesthesia    pt's mother has hx. of post-op N/V  . Headache   . Hypothyroidism   . Nasal septal deviation 11/2017  . Nasal turbinate hypertrophy 11/2017  . PONV (postoperative nausea and vomiting)   . Snoring 11/2017    Family History  Problem Relation Age of Onset  . Hypertension Mother   . Diabetes Mother   . Heart failure Father   . Hypertension Father   . Diabetes Father   . Heart failure Other   . Breast cancer Paternal Grandmother     Past Surgical History:  Procedure Laterality Date  . ABDOMINAL HYSTERECTOMY  2005   partial  . ANTERIOR CERVICAL DECOMP/DISCECTOMY FUSION  03/25/2007   C4-5  . CERVICAL FUSION  2013  . CHOLECYSTECTOMY N/A 03/26/2018   Procedure: LAPAROSCOPIC CHOLECYSTECTOMY;  Surgeon: Franky MachoJenkins, Mark, MD;  Location: [facility];  Service: General;  Laterality: N/A;  . KNEE ARTHROSCOPY Right   . LUMBAR LAMINECTOMY/DECOMPRESSION MICRODISCECTOMY  11/21/2004   L4-5  . MAXIMUM ACCESS (MAS)POSTERIOR LUMBAR INTERBODY FUSION (PLIF) 1 LEVEL N/A 09/25/2014   Procedure: Lumbar four-five MAXIMUM ACCESS (MAS) POSTERIOR LUMBAR INTERBODY FUSION (PLIF) 1 LEVEL;  Surgeon: Maeola HarmanJoseph Stern,  MD;  Location: [facility];  Service: Neurosurgery;  Laterality: N/A;  . NASAL SEPTOPLASTY W/ TURBINOPLASTY N/A 12/07/2017   Procedure: NASAL SEPTOPLASTY WITH BILATERAL TURBINATE REDUCTION;  Surgeon: Christia ReadingBates, Dwight, MD;  Location: [facility];  Service: ENT;  Laterality: N/A;  . ORIF ANKLE FRACTURE Right   . TONSILLECTOMY  06/2018  . UPPER GI ENDOSCOPY     Social History   Occupational History  . Not on file  Tobacco Use  . Smoking status: Never Smoker  . Smokeless tobacco: Never Used  Substance and Sexual Activity  . Alcohol  use: No    Frequency: Never  . Drug use: No  . Sexual activity: Yes    Birth control/protection: None

## 2019-05-13 ENCOUNTER — Other Ambulatory Visit: Payer: Self-pay

## 2019-05-13 ENCOUNTER — Ambulatory Visit
Admission: EM | Admit: 2019-05-13 | Discharge: 2019-05-13 | Disposition: A | Discharge status: Home / Self Care | Payer: BC Managed Care – PPO | Attending: Emergency Medicine | Admitting: Emergency Medicine

## 2019-05-13 ENCOUNTER — Telehealth: Payer: Self-pay | Admitting: General Practice

## 2019-05-13 DIAGNOSIS — Z20822 Contact with and (suspected) exposure to covid-19: Secondary | ICD-10-CM

## 2019-05-13 DIAGNOSIS — R6889 Other general symptoms and signs: Secondary | ICD-10-CM | POA: Insufficient documentation

## 2019-05-13 DIAGNOSIS — B349 Viral infection, unspecified: Secondary | ICD-10-CM | POA: Insufficient documentation

## 2019-05-13 LAB — POCT RAPID STREP A (OFFICE): Rapid Strep A Screen: NEGATIVE

## 2019-05-13 MED ORDER — BENZONATATE 100 MG PO CAPS: 100.0000 mg | ORAL_CAPSULE | Freq: Three times a day (TID) | ORAL | 0 refills | Status: DC

## 2019-05-13 MED ORDER — CETIRIZINE HCL 10 MG PO CHEW: 10.0000 mg | CHEWABLE_TABLET | Freq: Every day | ORAL | 0 refills | Status: DC

## 2019-05-13 MED ORDER — FLUTICASONE PROPIONATE 50 MCG/ACT NA SUSP: 2.0000 | Freq: Every day | NASAL | 0 refills | Status: DC

## 2019-05-13 NOTE — Discharge Instructions (Addendum)
Strep test negative, will send out for culture and we will call you with results COVID testing ordered.  Outpatient center will contact you regarding your appointment  In the meantime: You should remain isolated in your home for 7 days from symptom onset AND greater than 72 hours after symptoms resolution (absence of fever without the use of fever-reducing medication and improvement in respiratory symptoms), whichever is longer Get plenty of rest and push fluids Tessalon Perles prescribed for cough Zyrtec prescribed for nasal congestion, runny nose, and/or sore throat Flonase prescribed for nasal congestion and runny nose Use medications daily for symptom relief Use OTC medications like ibuprofen or tylenol as needed fever or pain Call or go to the ED if you have any new or worsening symptoms such as fever, worsening cough, shortness of breath, chest tightness, chest pain, turning blue, changes in mental status, etc..Marland Kitchen

## 2019-05-13 NOTE — ED Provider Notes (Signed)
[place]   [id number] 05/13/19 Arrival Time: 1013  IE:PPIR THROAT and cough  SUBJECTIVE: History from: patient.  Hannah Lynch is a 48 y.o. female who presents with sore throat and mild productive cough x 2 days.  Admits to positive exposure to COVID.  Denies sick exposure to strep, flu or mono.  Has tried OTC medications without relief.  Symptoms are made worse at night.  Complains of associated congestion, and some diarrhea.  Denies fever, chills, fatigue, ear pain, sinus pain, rhinorrhea, SOB, wheezing, chest pain, nausea, rash, changes in bladder habits.    ROS: As per HPI.  Past Medical History:  Diagnosis Date  . Anxiety   . Degenerative disc disease, lumbar   . Depression   . Family history of adverse reaction to anesthesia    pt's mother has hx. of post-op N/V  . Headache   . Hypothyroidism   . Nasal septal deviation 11/2017  . Nasal turbinate hypertrophy 11/2017  . PONV (postoperative nausea and vomiting)   . Snoring 11/2017   Past Surgical History:  Procedure Laterality Date  . ABDOMINAL HYSTERECTOMY  2005   partial  . ANTERIOR CERVICAL DECOMP/DISCECTOMY FUSION  03/25/2007   C4-5  . CERVICAL FUSION  2013  . CHOLECYSTECTOMY N/A 03/26/2018   Procedure: LAPAROSCOPIC CHOLECYSTECTOMY;  Surgeon: Aviva Signs, MD;  Location: [facility];  Service: General;  Laterality: N/A;  . KNEE ARTHROSCOPY Right   . LUMBAR LAMINECTOMY/DECOMPRESSION MICRODISCECTOMY  11/21/2004   L4-5  . MAXIMUM ACCESS (MAS)POSTERIOR LUMBAR INTERBODY FUSION (PLIF) 1 LEVEL N/A 09/25/2014   Procedure: Lumbar four-five MAXIMUM ACCESS (MAS) POSTERIOR LUMBAR INTERBODY FUSION (PLIF) 1 LEVEL;  Surgeon: Erline Levine, MD;  Location: [facility];  Service: Neurosurgery;  Laterality: N/A;  . NASAL SEPTOPLASTY W/ TURBINOPLASTY N/A 12/07/2017   Procedure: NASAL SEPTOPLASTY WITH BILATERAL TURBINATE REDUCTION;  Surgeon: Melida Quitter, MD;  Location: [facility];  Service: ENT;  Laterality: N/A;   . ORIF ANKLE FRACTURE Right   . TONSILLECTOMY  06/2018  . UPPER GI ENDOSCOPY     No Known Allergies Current Facility-Administered Medications on File Prior to Encounter  Medication Dose Route Frequency Provider Last Rate Last Dose  . lactated ringers infusion    Continuous PRN Laretta Alstrom, CRNA       Current Outpatient Medications on File Prior to Encounter  Medication Sig Dispense Refill  . acetaminophen (TYLENOL) 500 MG tablet Take 500 mg by mouth every 6 (six) hours as needed for mild pain or moderate pain.    Marland Kitchen amitriptyline (ELAVIL) 25 MG tablet Take 50 mg by mouth at bedtime.     Marland Kitchen amLODipine (NORVASC) 2.5 MG tablet Take 5 mg by mouth daily.   2  . citalopram (CELEXA) 20 MG tablet Take 20 mg by mouth daily.    Marland Kitchen levothyroxine (SYNTHROID, LEVOTHROID) 25 MCG tablet Take 25 mcg by mouth daily.     . meloxicam (MOBIC) 15 MG tablet Take 15 mg by mouth daily.    Marland Kitchen omeprazole (PRILOSEC) 20 MG capsule Take 20 mg by mouth daily.  3  . ondansetron (ZOFRAN) 4 MG tablet Take 4-8 mg by mouth daily as needed for nausea or vomiting.    . SUMAtriptan (IMITREX) 50 MG tablet Take 1 tablet by mouth as needed for migraine.      Social History   Socioeconomic History  . Marital status: Married    Spouse name: Not on file  . Number of children: Not on file  .  Years of education: Not on file  . Highest education level: Not on file  Occupational History  . Not on file  Social Needs  . Financial resource strain: Not on file  . Food insecurity    Worry: Not on file    Inability: Not on file  . Transportation needs    Medical: Not on file    Non-medical: Not on file  Tobacco Use  . Smoking status: Never Smoker  . Smokeless tobacco: Never Used  Substance and Sexual Activity  . Alcohol use: No    Frequency: Never  . Drug use: No  . Sexual activity: Yes    Birth control/protection: None  Lifestyle  . Physical activity    Days per week: Not on file    Minutes per session: Not on file   . Stress: Not on file  Relationships  . Social Musicianconnections    Talks on phone: Not on file    Gets together: Not on file    Attends religious service: Not on file    Active member of club or organization: Not on file    Attends meetings of clubs or organizations: Not on file    Relationship status: Not on file  . Intimate partner violence    Fear of current or ex partner: Not on file    Emotionally abused: Not on file    Physically abused: Not on file    Forced sexual activity: Not on file  Other Topics Concern  . Not on file  Social History Narrative  . Not on file   Family History  Problem Relation Age of Onset  . Hypertension Mother   . Diabetes Mother   . Heart failure Father   . Hypertension Father   . Diabetes Father   . Heart failure Other   . Breast cancer Paternal Grandmother     OBJECTIVE:  Vitals:   05/13/19 1025  BP: 130/89  Pulse: 100  Resp: 18  Temp: 98.7 F (37.1 C)  SpO2: 97%     General appearance: alert; well-appearing, nontoxic, speaking in full sentences and managing own secretions HEENT: NCAT; Ears: EACs clear, TMs pearly gray; Eyes: PERRL.  EOM grossly intact.  Nose: nares patent without rhinorrhea; tonsils mildly erythematous, uvula absent Neck: supple without LAD Lungs: CTA bilaterally without adventitious breath sounds; cough absent Heart: regular rate and rhythm.  Radial pulses 2+ symmetrical bilaterally Skin: warm and dry Psychological: alert and cooperative; normal mood and affect  LABS: Results for orders placed or performed during the hospital encounter of 05/13/19 (from the past 24 hour(s))  POCT rapid strep A     Status: None   Collection Time: 05/13/19 10:49 AM  Result Value Ref Range   Rapid Strep A Screen Negative Negative     ASSESSMENT & PLAN:  1. Viral illness   2. Suspected Covid-19 Virus Infection     Meds ordered this encounter  Medications  . benzonatate (TESSALON) 100 MG capsule    Sig: Take 1 capsule (100  mg total) by mouth every 8 (eight) hours.    Dispense:  21 capsule    Refill:  0    Order Specific Question:   Supervising Provider    Answer:   Eustace MooreNELSON, YVONNE SUE [[id number]]  . cetirizine (ZYRTEC) 10 MG chewable tablet    Sig: Chew 1 tablet (10 mg total) by mouth daily.    Dispense:  20 tablet    Refill:  0    Order Specific Question:  Supervising Provider    Answer:   Eustace MooreNELSON, YVONNE SUE [[id number]]  . fluticasone (FLONASE) 50 MCG/ACT nasal spray    Sig: Place 2 sprays into both nostrils daily.    Dispense:  16 g    Refill:  0    Order Specific Question:   Supervising Provider    Answer:   Eustace MooreELSON, YVONNE SUE [[id number]]    Strep test negative, will send out for culture and we will call you with results COVID testing ordered.  Outpatient center will contact you regarding your appointment  In the meantime: You should remain isolated in your home for 7 days from symptom onset AND greater than 72 hours after symptoms resolution (absence of fever without the use of fever-reducing medication and improvement in respiratory symptoms), whichever is longer Get plenty of rest and push fluids Tessalon Perles prescribed for cough Zyrtec prescribed for nasal congestion, runny nose, and/or sore throat Flonase prescribed for nasal congestion and runny nose Use medications daily for symptom relief Use OTC medications like ibuprofen or tylenol as needed fever or pain Call or go to the ED if you have any new or worsening symptoms such as fever, worsening cough, shortness of breath, chest tightness, chest pain, turning blue, changes in mental status, etc...    Reviewed expectations re: course of current medical issues. Questions answered. Outlined signs and symptoms indicating need for more acute intervention. Patient verbalized understanding. After Visit Summary given.        Rennis HardingWurst, Brittany, PA-C 05/13/19 1413

## 2019-05-13 NOTE — Telephone Encounter (Signed)
-----   Message from [place], [place] sent at 05/13/2019 11:06 AM EDT ----- Regarding: COVID testing Sore throat and cough x 2 days with positive exposure to COVID.

## 2019-05-13 NOTE — Telephone Encounter (Signed)
Pt has been schedule for COVID testing.  Scheduled appt with pt directly.  Pt was referred by: Stacey Drain, [place], PA-C

## 2019-05-13 NOTE — ED Triage Notes (Signed)
Pt has c/o sore throat and cough for past 2 days, unknown if fever

## 2019-05-13 NOTE — Addendum Note (Signed)
Addended by: Dimple Nanas on: 05/13/2019 11:31 AM   Modules accepted: Orders

## 2019-05-15 LAB — CULTURE, GROUP A STREP (THRC)

## 2019-05-16 ENCOUNTER — Other Ambulatory Visit: Payer: Self-pay

## 2019-05-16 DIAGNOSIS — Z20822 Contact with and (suspected) exposure to covid-19: Secondary | ICD-10-CM

## 2019-05-20 ENCOUNTER — Other Ambulatory Visit (HOSPITAL_COMMUNITY): Payer: Self-pay | Admitting: General Surgery

## 2019-05-20 ENCOUNTER — Other Ambulatory Visit: Payer: Self-pay | Admitting: General Surgery

## 2019-05-20 ENCOUNTER — Encounter: Payer: Self-pay | Admitting: Orthopaedic Surgery

## 2019-05-21 LAB — NOVEL CORONAVIRUS, NAA: SARS-CoV-2, NAA: NOT DETECTED

## 2019-05-25 ENCOUNTER — Encounter (HOSPITAL_COMMUNITY): Payer: Self-pay

## 2019-05-26 ENCOUNTER — Other Ambulatory Visit: Payer: BC Managed Care – PPO

## 2019-05-26 NOTE — Telephone Encounter (Signed)
Please advise status of patient's MRI. Thank you.

## 2019-05-27 ENCOUNTER — Other Ambulatory Visit (HOSPITAL_COMMUNITY): Payer: Self-pay | Admitting: General Surgery

## 2019-05-27 ENCOUNTER — Ambulatory Visit (HOSPITAL_COMMUNITY)
Admission: RE | Admit: 2019-05-27 | Discharge: 2019-05-27 | Disposition: A | Discharge status: Home / Self Care | Payer: BC Managed Care – PPO | Source: Ambulatory Visit | Attending: General Surgery | Admitting: General Surgery

## 2019-05-27 ENCOUNTER — Other Ambulatory Visit: Payer: Self-pay

## 2019-06-02 ENCOUNTER — Telehealth: Payer: Self-pay | Admitting: Orthopaedic Surgery

## 2019-06-02 NOTE — Telephone Encounter (Signed)
Patient's right ankle MRI was denied by insurance first of the month. A message was to be sent to Aaron Edelman to decide if he wants to try PT for patient since insurance is recommending that, but an answer has not been given as of yet. Patient calling because she has not heard anything since MRI was denied. Please call patient to inform of next step.

## 2019-06-02 NOTE — Telephone Encounter (Signed)
Please advise 

## 2019-06-03 ENCOUNTER — Other Ambulatory Visit: Payer: Self-pay | Admitting: *Deleted

## 2019-06-03 DIAGNOSIS — M25571 Pain in right ankle and joints of right foot: Secondary | ICD-10-CM

## 2019-06-03 NOTE — Telephone Encounter (Signed)
I called patient, PT ordered, Patient will call to schedule appt after 1 week PT

## 2019-06-07 ENCOUNTER — Ambulatory Visit: Payer: BC Managed Care – PPO | Admitting: Physical Therapy

## 2019-06-07 ENCOUNTER — Ambulatory Visit: Payer: BC Managed Care – PPO | Admitting: Orthopaedic Surgery

## 2019-06-14 ENCOUNTER — Encounter: Payer: Self-pay | Admitting: Skilled Nursing Facility1

## 2019-06-14 ENCOUNTER — Ambulatory Visit: Payer: BC Managed Care – PPO | Attending: Orthopaedic Surgery | Admitting: Physical Therapy

## 2019-06-14 ENCOUNTER — Other Ambulatory Visit: Payer: Self-pay

## 2019-06-14 ENCOUNTER — Encounter: Payer: BC Managed Care – PPO | Attending: General Surgery | Admitting: Skilled Nursing Facility1

## 2019-06-14 DIAGNOSIS — E669 Obesity, unspecified: Secondary | ICD-10-CM | POA: Insufficient documentation

## 2019-06-14 NOTE — Progress Notes (Signed)
Pre-Op Assessment Visit:  Pre-Operative sleeve gastrectomy Surgery  Medical Nutrition Therapy:  Appt start time: 11:00  End time:  12:00  Patient was seen on 06/14/2019 for Pre-Operative Nutrition Assessment. Assessment and letter of approval faxed to [facility] Bariatric Surgery Program coordinator on 06/14/2019.    Referral Stated SWL Appointments Required: 0  Proposed Surgery Type: Sleeve   Pt expectation of surgery: to be healthier  Pt expectation of dietitian: to help understand   NUTRITION ASSESSMENT   Anthropometrics  Start weight at [facility]: 231.3 lbs Today's weight: 231.3 lbs BMI: 39.70 kg/m2     Psychosocial/Lifestyle Dx: Fatty Liver   Pt states she does not eat a variety of non starchy vegetables and would like to change that.   Continue to walk 5 days a week 60 minutes to reduce serohepatitis.   Medications:  Labs:   Notable Signs/Symptoms    24-Hr Dietary Recall First Meal: fruit or protein shake or cereal Snack: fruit or yogurt Second Meal: salad Snack: yogurt or fruit or cracker Third Meal: roast and green beans and corn or mashed potatoes Snack:  Beverages: water, green tea, sweet tea, soda    Physical Activity  5 days a week walking 60 minutes   Estimated Energy Needs Calories: 1500 Carbohydrate: 170 Protein: 112 Fat:    NUTRITION DIAGNOSIS  Overweight/obesity ([place]-3.3) related to past poor dietary habits and physical inactivity as evidenced by patient w/ planned Sleeve surgery following dietary guidelines for continued weight loss.    NUTRITION INTERVENTION  Nutrition counseling (C-1) and education (E-2) to facilitate bariatric surgery goals.   Handouts given during visit include:  . Pre-Op Goals . Bariatric Surgery Protein Shakes . Vitamin and Mineral Options    During the appointment today the following Pre-Op Goals were reviewed with the patient: . Log your food and beverage via an app or pen and paper  . Make  healthy food choices . Begin to limit portion sizes . Limited concentrated sugars and fried foods . Keep fat/sugar in the single digits per serving on              food labels . Practice CHEWING your food  (aim for 30 chews per bite or until applesauce consistency) . Practice not drinking 15 minutes before, during, and 30 minutes after each meal/snack . Avoid all carbonated beverages  . Avoid/limit caffeinated beverages  . Avoid all sugar-sweetened beverages . Consume 3 meals per day; eat every 3-5 hours . Make a list of non-food related activities . Aim for 64-100 ounces of FLUID daily  . Aim for at least 60-80 grams of PROTEIN daily . Look for a liquid protein source that contain ?15 g protein and ?5 g carbohydrate  (ex: shakes, drinks, shots)   Change readiness: Ready  Demonstrated degree of understanding via: Teach Back      MONITORING & EVALUATION Dietary intake, weekly physical activity, body weight, and pre-op goals reached.    Next Steps  Patient is to call [facility] (once surgery date is scheduled) to be scheduled for Pre-Op Class, which must be at least 2 weeks prior to surgery date or back follow up visit in 1 month

## 2019-07-14 DIAGNOSIS — H903 Sensorineural hearing loss, bilateral: Secondary | ICD-10-CM | POA: Insufficient documentation

## 2019-07-25 ENCOUNTER — Encounter: Payer: BC Managed Care – PPO | Attending: General Surgery | Admitting: Skilled Nursing Facility1

## 2019-07-25 ENCOUNTER — Other Ambulatory Visit: Payer: Self-pay

## 2019-07-25 DIAGNOSIS — E669 Obesity, unspecified: Secondary | ICD-10-CM | POA: Insufficient documentation

## 2019-07-26 NOTE — Progress Notes (Addendum)
Pre-Operative Nutrition Class:  Appt start time: 8403   End time:  1830.  Patient was seen on 07/26/2019 for Pre-Operative Bariatric Surgery [facility].   Surgery date:  Surgery type: sleeve Start weight at [facility]: 231.3 Weight today: 230.9   The following the learning objectives were met by the patient during this course:  Identify Pre-Op Dietary Goals and will begin 2 weeks pre-operatively  Identify appropriate sources of fluids and proteins   State protein recommendations and appropriate sources pre and post-operatively  Identify Post-Operative Dietary Goals and will follow for 2 weeks post-operatively  Identify appropriate multivitamin and calcium sources  Describe the need for physical activity post-operatively and will follow MD recommendations  State when to call healthcare provider regarding medication questions or post-operative complications  Handouts given during class include:  Pre-Op Bariatric Surgery Diet Handout  Protein Shake Handout  Post-Op Bariatric Surgery Nutrition Handout  [facility] Program Information Flyer  Support Group Information Flyer  [facility] Bariatric Supplements Price List  Follow-Up Plan: Patient will follow-up at [facility] 2 weeks post operatively for diet advancement per MD.

## 2019-07-29 ENCOUNTER — Encounter: Payer: Self-pay | Admitting: Orthopaedic Surgery

## 2019-07-29 ENCOUNTER — Telehealth: Payer: Self-pay | Admitting: Orthopaedic Surgery

## 2019-07-29 ENCOUNTER — Other Ambulatory Visit: Payer: Self-pay | Admitting: Orthopaedic Surgery

## 2019-07-29 NOTE — Telephone Encounter (Signed)
Patient was returning your call. Patient states she lives in [place], and does not know whom you would refer to there. Please call to advise.

## 2019-07-29 NOTE — Telephone Encounter (Signed)
Tried to call patient. LMOM. I am going to enter a PT order at her request for her right ankle. I do not know where she prefers to go for therapy. I ask her to call back with location.

## 2019-08-01 ENCOUNTER — Other Ambulatory Visit: Payer: Self-pay | Admitting: Orthopaedic Surgery

## 2019-08-01 DIAGNOSIS — M25571 Pain in right ankle and joints of right foot: Secondary | ICD-10-CM

## 2019-08-01 NOTE — Telephone Encounter (Signed)
Spoke with patient. Order for PT has been entered for Hannah Lynch, and patient has been advised.

## 2019-08-16 ENCOUNTER — Ambulatory Visit (HOSPITAL_COMMUNITY): Payer: BC Managed Care – PPO

## 2019-08-29 ENCOUNTER — Ambulatory Visit: Payer: Self-pay | Admitting: General Surgery

## 2019-08-29 NOTE — H&P (View-Only) (Signed)
Hannah Lynch Documented: 08/19/2019 3:09 PM Location: [facility] Patient #: [id number] DOB: 11/20/1970 Married / Language: English / Race: White Female  History of Present Illness (Eric M. Wilson MD; 08/29/2019 12:51 PM) The patient is a 48 year old female who presents for a bariatric surgery evaluation. Patient comes in for long-term follow-up regarding her obesity. I initially met her a few months ago to discuss weight loss surgery. She denies any significant medical changes other than undergoing neck surgery which was planned at [facility]. She was having neck pain with paresthesias in one of her upper extremities. She states that that is completely improved. She denies any chest pain, chest pressure, shortness of breath, orthopnea, dyspnea on exertion, TIAs or amaurosis fugax. She is not on any blood thinners. She does not have a diagnosis of Crohn's disease. A1c was 5.6. She had a normal comprehensive metabolic panel and CBC in September in preparation for her next surgery at [facility]. She underwent anterior cervical discectomy and fusion on September 22.  She states that she did have a rash with Steri-Strips.  05/19/2019 She is referred by Dr Nyland and Dr Badger for evaluation of weight loss surgery. She completed our seminar on line. She is interested in sleeve gastrectomy. She believes it is the best procedure for her. It is not as invasive as a Roux-en-Y. She has an acquaintance who is undergoing a sleeve gastrectomy in December and has done well. Despite several attempts for sustained weight loss she has been unsuccessful. She has tried low-calorie diets as well as a diet supervised programs that had a prescription weight loss medication with it-and was unsuccessful.  Her comorbidities include hypertension, low HDL, hepatic steatosis, GERD, degenerative disc disease of the lower back, elevated blood glucose reading, metabolic syndrome.  She denies any chest pain,  chest pressure, angina, shortness of breath, dyspnea on exertion. She states that she may occasionally wake up in the middle the night coughing. She denies any prior blood clots or family history of blood clots. She will occasionally have some edema laterally above her ankles at the end of the day. She states that she had a negative sleep study in 2018. I reviewed her pulmonary note from 2018. She has heartburn. She takes omeprazole for it. She denies any sensation of food or liquids getting stuck. She states that she has a diagnosis of IBS. Her symptoms alternate between diarrhea and constipation. She had a colonoscopy which showed no evidence of ulcerative colitis or Crohn's. She was recently diagnosed with hepatic steatosis due to elevated transaminases. She had an ultrasound at the [facility] which demonstrated hepatic steatosis with a low score of fibrosis that needs no additional follow-up. She denies any melena or hematochezia. She denies any dysuria. She has chronic low back pain without sciatica. She has hypothyroidism. She has migraines. She has 1-2 migraines every other week.  She reportedly had a negative cardiac stress test at [facility] in 2018  She denies any tobacco, drug or alcohol use. She works as a cook.  She had recent labs in May that I was able to review 3 care everywhere. Normal metabolic panel except for a blood glucose level that was elevated, AST 42 ALT 57. CBC was of normal limits. Lipid panel was normal except for a low HDL level of 35. TSH was normal at 2.96.   Problem List/Past Medical (Eric M. Wilson, MD; 08/29/2019 12:53 PM) BLOOD GLUCOSE ELEVATED (R73.9) GASTROESOPHAGEAL REFLUX DISEASE, ESOPHAGITIS PRESENCE NOT SPECIFIED (K21.9) METABOLIC SYNDROME (  E88.81) LOW HDL (UNDER 40) (E78.6) OBESITY, CLASS III, BMI 40-49.9 (MORBID OBESITY) (E66.01) FATTY LIVER (K76.0) CHRONIC MIGRAINE WITHOUT AURA WITHOUT STATUS MIGRAINOSUS, NOT INTRACTABLE  (W97.989)  Past Surgical History Randall Hiss M. Redmond Pulling, MD; 08/29/2019 12:53 PM) Foot Surgery Right. Gallbladder Surgery - Laparoscopic Hysterectomy (not due to cancer) - Partial Knee Surgery Bilateral. Spinal Surgery - Lower Back Spinal Surgery - Neck Tonsillectomy  Diagnostic Studies History Randall Hiss M. Redmond Pulling, MD; 08/29/2019 12:53 PM) Colonoscopy within last year Mammogram within last year Pap Smear 1-5 years ago  Allergies Nance Pew, CMA; 08/19/2019 3:10 PM) No Known Allergies [08/19/2019]: No Known Drug Allergies [05/19/2019]: Allergies Reconciled  Medication History Nance Pew, CMA; 08/19/2019 3:11 PM) amLODIPine Besylate (5MG Tablet, Oral) Active. Zofran (4MG Tablet, Oral) Active. Amitriptyline HCl (50MG Tablet, Oral) Active. Levothyroxine Sodium (25MCG Capsule, Oral) Active. Citalopram Hydrobromide (20MG Tablet, Oral) Active. SUMAtriptan Succinate (50MG Tablet, Oral) Active. Meloxicam (15MG Tablet, Oral) Active. Lisinopril (20MG Tablet, Oral) Active. Ondansetron (4MG Tablet Disint, Oral) Active. Medications Reconciled  Social History Randall Hiss M. Redmond Pulling, MD; 08/29/2019 12:53 PM) Alcohol use Occasional alcohol use. Caffeine use Carbonated beverages, Coffee, Tea. No drug use Tobacco use Never smoker.  Family History Randall Hiss M. Redmond Pulling, MD; 08/29/2019 12:53 PM) Anesthetic complications Mother. Arthritis Mother. Diabetes Mellitus Mother. Heart Disease Father, Son. Hypertension Father.  Pregnancy / Birth History Randall Hiss M. Redmond Pulling, MD; 08/29/2019 12:53 PM) Age at menarche 65 years. Gravida 2 Length (months) of breastfeeding 12-24 Maternal age 69-20 Para 2  Other Problems Randall Hiss M. Redmond Pulling, MD; 08/29/2019 12:53 PM) Anxiety Disorder Arthritis Depression General anesthesia - complications High blood pressure Migraine Headache HYPOTHYROIDISM, ADULT (E03.9) BACK PAIN, LUMBOSACRAL (M54.5)     Review of Systems Randall Hiss M.  Wilson MD; 08/29/2019 12:51 PM) All other systems negative  Vitals (Sabrina Canty CMA; 08/19/2019 3:12 PM) 08/19/2019 3:11 PM Weight: 227.25 lb Height: 64in Waist: 48.5in Body Surface Area: 2.07 m Body Mass Index: 39.01 kg/m  Temp.: 98.16F(Temporal)  Pulse: 128 (Regular)  Resp.: 16 (Unlabored)  P.OX: 93% (Room air) BP: 118/64 (Sitting, Left Arm, Standard)        Physical Exam Randall Hiss M. Wilson MD; 08/29/2019 12:52 PM)  General Mental Status-Alert. General Appearance-Consistent with stated age. Hydration-Well hydrated. Voice-Normal. Note: severe obesity, mainly truncal  Head and Neck Head-normocephalic, atraumatic with no lesions or palpable masses. Trachea-midline. Thyroid Gland Characteristics - normal size and consistency.  Eye Eyeball - Bilateral-Normal. Sclera/Conjunctiva - Bilateral-No scleral icterus.  ENMT Ears Pinna - Bilateral - no bony growth in lateral aspect of ear canal, no drainage observed. Nose and Sinuses External Inspection of the Nose - symmetric, no deformities observed.  Chest and Lung Exam Chest and lung exam reveals -quiet, even and easy respiratory effort with no use of accessory muscles and on auscultation, normal breath sounds, no adventitious sounds and normal vocal resonance. Inspection Chest Wall - Normal. Back - normal.  Breast - Did not examine.  Cardiovascular Cardiovascular examination reveals -normal heart sounds, regular rate and rhythm with no murmurs and normal pedal pulses bilaterally.  Abdomen Inspection Inspection of the abdomen reveals - No Hernias. Skin - Scar - Note: well healed trocar scars. Palpation/Percussion Palpation and Percussion of the abdomen reveal - Soft, Non Tender, No Rebound tenderness, No Rigidity (guarding) and No hepatosplenomegaly. Auscultation Auscultation of the abdomen reveals - Bowel sounds normal.  Peripheral Vascular Upper Extremity Palpation - Pulses  bilaterally normal.  Neurologic Neurologic evaluation reveals -alert and oriented x 3 with no impairment of recent or remote memory. Mental Status-Normal.  Neuropsychiatric  The patient's mood and affect are described as -normal. Judgment and Insight-insight is appropriate concerning matters relevant to self.  Musculoskeletal Normal Exam - Left-Upper Extremity Strength Normal and Lower Extremity Strength Normal. Normal Exam - Right-Upper Extremity Strength Normal and Lower Extremity Strength Normal.  Lymphatic Head & Neck  General Head & Neck Lymphatics: Bilateral - Description - Normal. Axillary - Did not examine. Femoral & Inguinal - Did not examine.    Assessment & Plan (Eric M. Wilson MD; 08/29/2019 12:53 PM)  OBESITY, CLASS III, BMI 40-49.9 (MORBID OBESITY) (E66.01) Impression: The patient meets weight loss surgery criteria. I think the patient would be an acceptable candidate for Laparoscopic vertical sleeve gastrectomy.  We reviewed the patient's preoperative evaluation and work-up. I think she is stable for surgery. We will ask her neck surgeon if there is any precautions we need to take as well as to make sure she is cleared for surgery from their standpoint. She had a previous upper GI at outside hospital which suggested a possible small hiatal hernia. We rediscussed that we would test for one intraoperatively. If found to have 1 we would proceed with repair. We discussed what that would involve. We rediscussed the typical hospitalization as well as the typical recovery. All of her questions were asked and answered.  Current Plans Pt Education - EMW_preopbariatric  BLOOD GLUCOSE ELEVATED (R73.9)   GASTROESOPHAGEAL REFLUX DISEASE, ESOPHAGITIS PRESENCE NOT SPECIFIED (K21.9) Impression: We did discuss that sleeve gastrectomy could worsen her reflux. We discussed the incidence of reflux after sleeve gastrectomy and how it can be managed.   METABOLIC SYNDROME  (E88.81)   FATTY LIVER (K76.0)   CHRONIC MIGRAINE WITHOUT AURA WITHOUT STATUS MIGRAINOSUS, NOT INTRACTABLE (G43.709)   LOW HDL (UNDER 40) (E78.6) 

## 2019-08-29 NOTE — H&P (Signed)
Hannah Lynch Documented: 08/19/2019 3:09 PM Location: [facility] Patient #: [id number] DOB: 08/25/1971 Married / Language: English / Race: White Female  History of Present Illness ( M.  MD; 08/29/2019 12:51 PM) The patient is a 48 year old female who presents for a bariatric surgery evaluation. Patient comes in for long-term follow-up regarding her obesity. I initially met her a few months ago to discuss weight loss surgery. She denies any significant medical changes other than undergoing neck surgery which was planned at [facility]. She was having neck pain with paresthesias in one of her upper extremities. She states that that is completely improved. She denies any chest pain, chest pressure, shortness of breath, orthopnea, dyspnea on exertion, TIAs or amaurosis fugax. She is not on any blood thinners. She does not have a diagnosis of Crohn's disease. A1c was 5.6. She had a normal comprehensive metabolic panel and CBC in September in preparation for her next surgery at [facility]. She underwent anterior cervical discectomy and fusion on September 22.  She states that she did have a rash with Steri-Strips.  05/19/2019 She is referred by Dr Nyland and Dr Badger for evaluation of weight loss surgery. She completed our seminar on line. She is interested in sleeve gastrectomy. She believes it is the best procedure for her. It is not as invasive as a Roux-en-Y. She has an acquaintance who is undergoing a sleeve gastrectomy in December and has done well. Despite several attempts for sustained weight loss she has been unsuccessful. She has tried low-calorie diets as well as a diet supervised programs that had a prescription weight loss medication with it-and was unsuccessful.  Her comorbidities include hypertension, low HDL, hepatic steatosis, GERD, degenerative disc disease of the lower back, elevated blood glucose reading, metabolic syndrome.  She denies any chest pain,  chest pressure, angina, shortness of breath, dyspnea on exertion. She states that she may occasionally wake up in the middle the night coughing. She denies any prior blood clots or family history of blood clots. She will occasionally have some edema laterally above her ankles at the end of the day. She states that she had a negative sleep study in 2018. I reviewed her pulmonary note from 2018. She has heartburn. She takes omeprazole for it. She denies any sensation of food or liquids getting stuck. She states that she has a diagnosis of IBS. Her symptoms alternate between diarrhea and constipation. She had a colonoscopy which showed no evidence of ulcerative colitis or Crohn's. She was recently diagnosed with hepatic steatosis due to elevated transaminases. She had an ultrasound at the [facility] which demonstrated hepatic steatosis with a low score of fibrosis that needs no additional follow-up. She denies any melena or hematochezia. She denies any dysuria. She has chronic low back pain without sciatica. She has hypothyroidism. She has migraines. She has 1-2 migraines every other week.  She reportedly had a negative cardiac stress test at [facility] in 2018  She denies any tobacco, drug or alcohol use. She works as a cook.  She had recent labs in May that I was able to review 3 care everywhere. Normal metabolic panel except for a blood glucose level that was elevated, AST 42 ALT 57. CBC was of normal limits. Lipid panel was normal except for a low HDL level of 35. TSH was normal at 2.96.   Problem List/Past Medical ( M. , MD; 08/29/2019 12:53 PM) BLOOD GLUCOSE ELEVATED (R73.9) GASTROESOPHAGEAL REFLUX DISEASE, ESOPHAGITIS PRESENCE NOT SPECIFIED (K21.9) METABOLIC SYNDROME (  E88.81) LOW HDL (UNDER 40) (E78.6) OBESITY, CLASS III, BMI 40-49.9 (MORBID OBESITY) (E66.01) FATTY LIVER (K76.0) CHRONIC MIGRAINE WITHOUT AURA WITHOUT STATUS MIGRAINOSUS, NOT INTRACTABLE  (G43.709)  Past Surgical History ( M. , MD; 08/29/2019 12:53 PM) Foot Surgery Right. Gallbladder Surgery - Laparoscopic Hysterectomy (not due to cancer) - Partial Knee Surgery Bilateral. Spinal Surgery - Lower Back Spinal Surgery - Neck Tonsillectomy  Diagnostic Studies History ( M. , MD; 08/29/2019 12:53 PM) Colonoscopy within last year Mammogram within last year Pap Smear 1-5 years ago  Allergies (Sabrina Canty, CMA; 08/19/2019 3:10 PM) No Known Allergies [08/19/2019]: No Known Drug Allergies [05/19/2019]: Allergies Reconciled  Medication History (Sabrina Canty, CMA; 08/19/2019 3:11 PM) amLODIPine Besylate (5MG Tablet, Oral) Active. Zofran (4MG Tablet, Oral) Active. Amitriptyline HCl (50MG Tablet, Oral) Active. Levothyroxine Sodium (25MCG Capsule, Oral) Active. Citalopram Hydrobromide (20MG Tablet, Oral) Active. SUMAtriptan Succinate (50MG Tablet, Oral) Active. Meloxicam (15MG Tablet, Oral) Active. Lisinopril (20MG Tablet, Oral) Active. Ondansetron (4MG Tablet Disint, Oral) Active. Medications Reconciled  Social History ( M. , MD; 08/29/2019 12:53 PM) Alcohol use Occasional alcohol use. Caffeine use Carbonated beverages, Coffee, Tea. No drug use Tobacco use Never smoker.  Family History ( M. , MD; 08/29/2019 12:53 PM) Anesthetic complications Mother. Arthritis Mother. Diabetes Mellitus Mother. Heart Disease Father, Son. Hypertension Father.  Pregnancy / Birth History ( M. , MD; 08/29/2019 12:53 PM) Age at menarche 18 years. Gravida 2 Length (months) of breastfeeding 12-24 Maternal age 15-20 Para 2  Other Problems ( M. , MD; 08/29/2019 12:53 PM) Anxiety Disorder Arthritis Depression General anesthesia - complications High blood pressure Migraine Headache HYPOTHYROIDISM, ADULT (E03.9) BACK PAIN, LUMBOSACRAL (M54.5)     Review of Systems ( M.   MD; 08/29/2019 12:51 PM) All other systems negative  Vitals (Sabrina Canty CMA; 08/19/2019 3:12 PM) 08/19/2019 3:11 PM Weight: 227.25 lb Height: 64in Waist: 48.5in Body Surface Area: 2.07 m Body Mass Index: 39.01 kg/m  Temp.: 98.3F(Temporal)  Pulse: 128 (Regular)  Resp.: 16 (Unlabored)  P.OX: 93% (Room air) BP: 118/64 (Sitting, Left Arm, Standard)        Physical Exam ( M.  MD; 08/29/2019 12:52 PM)  General Mental Status-Alert. General Appearance-Consistent with stated age. Hydration-Well hydrated. Voice-Normal. Note: severe obesity, mainly truncal  Head and Neck Head-normocephalic, atraumatic with no lesions or palpable masses. Trachea-midline. Thyroid Gland Characteristics - normal size and consistency.  Eye Eyeball - Bilateral-Normal. Sclera/Conjunctiva - Bilateral-No scleral icterus.  ENMT Ears Pinna - Bilateral - no bony growth in lateral aspect of ear canal, no drainage observed. Nose and Sinuses External Inspection of the Nose - symmetric, no deformities observed.  Chest and Lung Exam Chest and lung exam reveals -quiet, even and easy respiratory effort with no use of accessory muscles and on auscultation, normal breath sounds, no adventitious sounds and normal vocal resonance. Inspection Chest Wall - Normal. Back - normal.  Breast - Did not examine.  Cardiovascular Cardiovascular examination reveals -normal heart sounds, regular rate and rhythm with no murmurs and normal pedal pulses bilaterally.  Abdomen Inspection Inspection of the abdomen reveals - No Hernias. Skin - Scar - Note: well healed trocar scars. Palpation/Percussion Palpation and Percussion of the abdomen reveal - Soft, Non Tender, No Rebound tenderness, No Rigidity (guarding) and No hepatosplenomegaly. Auscultation Auscultation of the abdomen reveals - Bowel sounds normal.  Peripheral Vascular Upper Extremity Palpation - Pulses  bilaterally normal.  Neurologic Neurologic evaluation reveals -alert and oriented x 3 with no impairment of recent or remote memory. Mental Status-Normal.  Neuropsychiatric   The patient's mood and affect are described as -normal. Judgment and Insight-insight is appropriate concerning matters relevant to self.  Musculoskeletal Normal Exam - Left-Upper Extremity Strength Normal and Lower Extremity Strength Normal. Normal Exam - Right-Upper Extremity Strength Normal and Lower Extremity Strength Normal.  Lymphatic Head & Neck  General Head & Neck Lymphatics: Bilateral - Description - Normal. Axillary - Did not examine. Femoral & Inguinal - Did not examine.    Assessment & Plan ( M.  MD; 08/29/2019 12:53 PM)  OBESITY, CLASS III, BMI 40-49.9 (MORBID OBESITY) (E66.01) Impression: The patient meets weight loss surgery criteria. I think the patient would be an acceptable candidate for Laparoscopic vertical sleeve gastrectomy.  We reviewed the patient's preoperative evaluation and work-up. I think she is stable for surgery. We will ask her neck surgeon if there is any precautions we need to take as well as to make sure she is cleared for surgery from their standpoint. She had a previous upper GI at outside hospital which suggested a possible small hiatal hernia. We rediscussed that we would test for one intraoperatively. If found to have 1 we would proceed with repair. We discussed what that would involve. We rediscussed the typical hospitalization as well as the typical recovery. All of her questions were asked and answered.  Current Plans Pt Education - EMW_preopbariatric  BLOOD GLUCOSE ELEVATED (R73.9)   GASTROESOPHAGEAL REFLUX DISEASE, ESOPHAGITIS PRESENCE NOT SPECIFIED (K21.9) Impression: We did discuss that sleeve gastrectomy could worsen her reflux. We discussed the incidence of reflux after sleeve gastrectomy and how it can be managed.   METABOLIC SYNDROME  (E88.81)   FATTY LIVER (K76.0)   CHRONIC MIGRAINE WITHOUT AURA WITHOUT STATUS MIGRAINOSUS, NOT INTRACTABLE (G43.709)   LOW HDL (UNDER 40) (E78.6) 

## 2019-09-21 NOTE — Patient Instructions (Addendum)
DUE TO COVID-19 ONLY ONE VISITOR IS ALLOWED TO COME WITH YOU AND STAY IN THE WAITING ROOM ONLY DURING PRE OP AND PROCEDURE DAY OF SURGERY. THE 1 VISITOR MAY VISIT WITH YOU AFTER SURGERY IN YOUR PRIVATE ROOM DURING VISITING HOURS ONLY!  YOU NEED TO HAVE A COVID 19 TEST ON_11/13/2020______ :50am______, THIS TEST MUST BE DONE BEFORE SURGERY, COME  [address], [place] , [address].  [facility]) ONCE YOUR COVID TEST IS COMPLETED, PLEASE BEGIN THE QUARANTINE INSTRUCTIONS AS OUTLINED IN YOUR HANDOUT.                Hannah Lynch   Your procedure is scheduled on: 09-27-2019    Report to [facility] Main  Entrance    Report to Admitting at 11:30 AM     Call this number if you have problems the morning of surgery [phone]    Remember: MORNING OF SURGERY DRINK:   DRINK 1 G2 drink BEFORE YOU LEAVE HOME, DRINK ALL OF THE  G2 DRINK AT ONE TIME.   NO SOLID FOOD AFTER 600 PM THE NIGHT BEFORE YOUR SURGERY. YOU MAY DRINK CLEAR FLUIDS. THE G2 DRINK YOU DRINK BEFORE YOU LEAVE HOME WILL BE THE LAST FLUIDS YOU DRINK BEFORE SURGERY. PLEASE CONSUME BY 10:30 AM  PAIN IS EXPECTED AFTER SURGERY AND WILL NOT BE COMPLETELY ELIMINATED. AMBULATION AND TYLENOL WILL HELP REDUCE INCISIONAL AND GAS PAIN. MOVEMENT IS KEY!  YOU ARE EXPECTED TO BE OUT OF BED WITHIN 4 HOURS OF ADMISSION TO YOUR PATIENT ROOM.  SITTING IN THE RECLINER THROUGHOUT THE DAY IS IMPORTANT FOR DRINKING FLUIDS AND MOVING GAS THROUGHOUT THE GI TRACT.  COMPRESSION STOCKINGS SHOULD BE WORN [facility] STAY UNLESS YOU ARE WALKING.   INCENTIVE SPIROMETER SHOULD BE USED EVERY HOUR WHILE AWAKE TO DECREASE POST-OPERATIVE COMPLICATIONS SUCH AS PNEUMONIA.  WHEN DISCHARGED HOME, IT IS IMPORTANT TO CONTINUE TO WALK EVERY HOUR AND USE THE INCENTIVE SPIROMETER EVERY HOUR.           CLEAR LIQUID DIET   Foods Allowed                                                                     Foods  Excluded  Coffee and tea, regular and decaf                             liquids that you cannot  Plain Jell-O any favor except red or purple             see through such as: Fruit ices (not with fruit pulp)                                     milk, soups, orange juice  Iced Popsicles                                    All solid food Carbonated beverages, regular and diet  Cranberry, grape and apple juices Sports drinks like Gatorade Lightly seasoned clear broth or consume(fat free) Sugar, honey syrup  Sample Menu Breakfast                                Lunch                                     Supper Cranberry juice                    Beef broth                            Chicken broth Jell-O                                     Grape juice                           Apple juice Coffee or tea                        Jell-O                                      Popsicle                                                Coffee or tea                        Coffee or tea  _____________________________________________________________________       Take these medicines the morning of surgery with A SIP OF WATER: Cetirizine (Zyrtec), Levothryoxine (Synthroid), Montelukast (Singular), Omeprazole (Prilosec), and Imitrex, prn. You may also bring and use your nasal spray as needed.   BRUSH YOUR TEETH MORNING OF SURGERY AND RINSE YOUR MOUTH OUT, NO CHEWING GUM CANDY OR MINTS.                                You may not have any metal on your body including hair pins and              piercings     Do not wear jewelry, make-up, lotions, powders or perfumes, deodorant             Do not wear nail polish on your fingernails.  Do not shave  48 hours prior to surgery.              Do not bring valuables to the hospital. [place].  Contacts, dentures or bridgework may not be worn into surgery.  Leave suitcase in the  car. After surgery it may be brought to your room.     Special Instructions: N/A  Please read over the following fact sheets you were given: _____________________________________________________________________             [facility] Before surgery, you can play an important role.  Because skin is not sterile, your skin needs to be as free of germs as possible.  You can reduce the number of germs on your skin by washing with CHG (chlorahexidine gluconate) soap before surgery.  CHG is an antiseptic cleaner which kills germs and bonds with the skin to continue killing germs even after washing. Please DO NOT use if you have an allergy to CHG or antibacterial soaps.  If your skin becomes reddened/irritated stop using the CHG and inform your nurse when you arrive at Short Stay. Do not shave (including legs and underarms) for at least 48 hours prior to the first CHG shower.  You may shave your face/neck. Please follow these instructions carefully:  1.  Shower with CHG Soap the night before surgery and the  morning of Surgery.  2.  If you choose to wash your hair, wash your hair first as usual with your  normal  shampoo.  3.  After you shampoo, rinse your hair and body thoroughly to remove the  shampoo.                            4.  Use CHG as you would any other liquid soap.  You can apply chg directly  to the skin and wash                       Gently with a scrungie or clean washcloth.  5.  Apply the CHG Soap to your body ONLY FROM THE NECK DOWN.   Do not use on face/ open                           Wound or open sores. Avoid contact with eyes, ears mouth and genitals (private parts).                       Wash face,  Genitals (private parts) with your normal soap.             6.  Wash thoroughly, paying special attention to the area where your surgery  will be performed.  7.  Thoroughly rinse your body with warm water from the neck down.  8.  DO NOT  shower/wash with your normal soap after using and rinsing off  the CHG Soap.                9.  Pat yourself dry with a clean towel.            10.  Wear clean pajamas.            11.  Place clean sheets on your bed the night of your first shower and do not  sleep with pets. Day of Surgery : Do not apply any lotions/deodorants the morning of surgery.  Please wear clean clothes to the hospital/surgery center.  FAILURE TO FOLLOW THESE INSTRUCTIONS MAY RESULT IN THE CANCELLATION OF YOUR SURGERY PATIENT SIGNATURE_________________________________  NURSE SIGNATURE__________________________________  ________________________________________________________________________   Rogelia MireIncentive Spirometer  An incentive spirometer is a tool that can help keep your lungs clear and active. This tool measures how well you are filling your lungs with each breath. Taking  long deep breaths may help reverse or decrease the chance of developing breathing (pulmonary) problems (especially infection) following:  A long period of time when you are unable to move or be active. BEFORE THE PROCEDURE   If the spirometer includes an indicator to show your best effort, your nurse or respiratory therapist will set it to a desired goal.  If possible, sit up straight or lean slightly forward. Try not to slouch.  Hold the incentive spirometer in an upright position. INSTRUCTIONS FOR USE  1. Sit on the edge of your bed if possible, or sit up as far as you can in bed or on a chair. 2. Hold the incentive spirometer in an upright position. 3. Breathe out normally. 4. Place the mouthpiece in your mouth and seal your lips tightly around it. 5. Breathe in slowly and as deeply as possible, raising the piston or the ball toward the top of the column. 6. Hold your breath for 3-5 seconds or for as long as possible. Allow the piston or ball to fall to the bottom of the column. 7. Remove the mouthpiece from your mouth and breathe out  normally. 8. Rest for a few seconds and repeat Steps 1 through 7 at least 10 times every 1-2 hours when you are awake. Take your time and take a few normal breaths between deep breaths. 9. The spirometer may include an indicator to show your best effort. Use the indicator as a goal to work toward during each repetition. 10. After each set of 10 deep breaths, practice coughing to be sure your lungs are clear. If you have an incision (the cut made at the time of surgery), support your incision when coughing by placing a pillow or rolled up towels firmly against it. Once you are able to get out of bed, walk around indoors and cough well. You may stop using the incentive spirometer when instructed by your caregiver.  RISKS AND COMPLICATIONS  Take your time so you do not get dizzy or light-headed.  If you are in pain, you may need to take or ask for pain medication before doing incentive spirometry. It is harder to take a deep breath if you are having pain. AFTER USE  Rest and breathe slowly and easily.  It can be helpful to keep track of a log of your progress. Your caregiver can provide you with a simple table to help with this. If you are using the spirometer at home, follow these instructions: SEEK MEDICAL CARE IF:   You are having difficultly using the spirometer.  You have trouble using the spirometer as often as instructed.  Your pain medication is not giving enough relief while using the spirometer.  You develop fever of 100.5 F (38.1 C) or higher. SEEK IMMEDIATE MEDICAL CARE IF:   You cough up bloody sputum that had not been present before.  You develop fever of 102 F (38.9 C) or greater.  You develop worsening pain at or near the incision site. MAKE SURE YOU:   Understand these instructions.  Will watch your condition.  Will get help right away if you are not doing well or get worse. Document Released: 03/09/2007 Document Revised: 01/19/2012 Document Reviewed:  05/10/2007 [facility] Patient Information 2014 [place], [place].   ________________________________________________________________________

## 2019-09-22 ENCOUNTER — Other Ambulatory Visit: Payer: Self-pay

## 2019-09-22 ENCOUNTER — Ambulatory Visit
Admission: EM | Admit: 2019-09-22 | Discharge: 2019-09-22 | Disposition: A | Discharge status: Home / Self Care | Payer: BC Managed Care – PPO | Attending: Emergency Medicine | Admitting: Emergency Medicine

## 2019-09-22 ENCOUNTER — Encounter (HOSPITAL_COMMUNITY): Payer: Self-pay

## 2019-09-22 ENCOUNTER — Ambulatory Visit (INDEPENDENT_AMBULATORY_CARE_PROVIDER_SITE_OTHER): Payer: BC Managed Care – PPO

## 2019-09-22 ENCOUNTER — Encounter (HOSPITAL_COMMUNITY)
Admission: RE | Admit: 2019-09-22 | Discharge: 2019-09-22 | Disposition: A | Discharge status: Home / Self Care | Payer: BC Managed Care – PPO | Source: Ambulatory Visit | Attending: General Surgery | Admitting: General Surgery

## 2019-09-22 DIAGNOSIS — M25532 Pain in left wrist: Secondary | ICD-10-CM | POA: Diagnosis not present

## 2019-09-22 DIAGNOSIS — M25562 Pain in left knee: Secondary | ICD-10-CM

## 2019-09-22 DIAGNOSIS — M79644 Pain in right finger(s): Secondary | ICD-10-CM | POA: Diagnosis not present

## 2019-09-22 DIAGNOSIS — Z01812 Encounter for preprocedural laboratory examination: Secondary | ICD-10-CM | POA: Insufficient documentation

## 2019-09-22 DIAGNOSIS — W19XXXA Unspecified fall, initial encounter: Secondary | ICD-10-CM

## 2019-09-22 HISTORY — DX: Irritable bowel syndrome, unspecified: K58.9

## 2019-09-22 LAB — COMPREHENSIVE METABOLIC PANEL
ALT: 55 U/L — ABNORMAL HIGH (ref 0–44)
AST: 40 U/L (ref 15–41)
Albumin: 4.3 g/dL (ref 3.5–5.0)
Alkaline Phosphatase: 82 U/L (ref 38–126)
Anion gap: 7 (ref 5–15)
BUN: 20 mg/dL (ref 6–20)
CO2: 25 mmol/L (ref 22–32)
Calcium: 9.4 mg/dL (ref 8.9–10.3)
Chloride: 103 mmol/L (ref 98–111)
Creatinine, Ser: 0.94 mg/dL (ref 0.44–1.00)
GFR calc Af Amer: >60 mL/min (ref >60)
GFR calc non Af Amer: >60 mL/min (ref >60)
Glucose, Bld: 93 mg/dL (ref 70–99)
Potassium: 4.3 mmol/L (ref 3.5–5.1)
Sodium: 135 mmol/L (ref 135–145)
Total Bilirubin: 0.7 mg/dL (ref 0.3–1.2)
Total Protein: 7.8 g/dL (ref 6.5–8.1)

## 2019-09-22 LAB — CBC WITH DIFFERENTIAL/PLATELET
Abs Immature Granulocytes: 0.04 10*3/uL (ref 0.00–0.07)
Basophils Absolute: 0 10*3/uL (ref 0.0–0.1)
Basophils Relative: 1 %
Eosinophils Absolute: 0.2 10*3/uL (ref 0.0–0.5)
Eosinophils Relative: 2 %
HCT: 41.9 % (ref 36.0–46.0)
Hemoglobin: 13.8 g/dL (ref 12.0–15.0)
Immature Granulocytes: 1 %
Lymphocytes Relative: 35 %
Lymphs Abs: 2.9 10*3/uL (ref 0.7–4.0)
MCH: 31.4 pg (ref 26.0–34.0)
MCHC: 32.9 g/dL (ref 30.0–36.0)
MCV: 95.4 fL (ref 80.0–100.0)
Monocytes Absolute: 0.5 10*3/uL (ref 0.1–1.0)
Monocytes Relative: 6 %
Neutro Abs: 4.6 10*3/uL (ref 1.7–7.7)
Neutrophils Relative %: 55 %
Platelets: 311 10*3/uL (ref 150–400)
RBC: 4.39 MIL/uL (ref 3.87–5.11)
RDW: 13 % (ref 11.5–15.5)
WBC: 8.2 10*3/uL (ref 4.0–10.5)
nRBC: 0 % (ref 0.0–0.2)

## 2019-09-22 LAB — ABO/RH: ABO/RH(D): O POS

## 2019-09-22 MED ORDER — NAPROXEN 500 MG PO TABS: 500.0000 mg | ORAL_TABLET | Freq: Two times a day (BID) | ORAL | 0 refills | Status: DC

## 2019-09-22 NOTE — ED Provider Notes (Signed)
[place]   [id number] 09/22/19 Arrival Time: 7106  CC: Fall  SUBJECTIVE: History from: patient. Hannah Lynch is a 48 y.o. female complains of left knee pain, left wrist pain, and right finger pain that began this morning.  Symptoms began after her left knee gave out and she fell backwards.  In the process she twisted her left knee and injured her left wrist and RT small finger.  Localizes the pain to the outside of left knee, outside of left wrist, and distal tip of RT small finger.  Describes the pain as intermittent and achy in character.  Left knee pain is the worst, and 7/10.  Has tried icing with relief.  Symptoms are made worse with walking on LT knee, and ROM with LT wrist and RT finger.  Reports meniscus repair on LT knee.  Complains of possible LT knee swelling, now improved.  Denies fever, chills, erythema, ecchymosis, effusion, weakness, numbness and tingling.  ROS: As per HPI.  All other pertinent ROS negative.     Past Medical History:  Diagnosis Date   Anxiety    Degenerative disc disease, lumbar    Degenerative disc disease, lumbar 2012   lumbar and cervical region   Depression    Family history of adverse reaction to anesthesia    pt's mother has hx. of post-op N/V   Headache    Hypothyroidism    IBS (irritable bowel syndrome)    Nasal septal deviation 11/2017   Nasal turbinate hypertrophy 11/2017   PONV (postoperative nausea and vomiting)    Snoring 11/2017   Past Surgical History:  Procedure Laterality Date   ABDOMINAL HYSTERECTOMY  2005   partial   ANTERIOR CERVICAL DECOMP/DISCECTOMY FUSION  03/25/2007   C4-5   CERVICAL FUSION  2013   CHOLECYSTECTOMY N/A 03/26/2018   Procedure: LAPAROSCOPIC CHOLECYSTECTOMY;  Surgeon: Aviva Signs, MD;  Location: [facility];  Service: General;  Laterality: N/A;   KNEE ARTHROSCOPY Right    LUMBAR LAMINECTOMY/DECOMPRESSION MICRODISCECTOMY  11/21/2004   L4-5   MAXIMUM ACCESS (MAS)POSTERIOR LUMBAR  INTERBODY FUSION (PLIF) 1 LEVEL N/A 09/25/2014   Procedure: Lumbar four-five MAXIMUM ACCESS (MAS) POSTERIOR LUMBAR INTERBODY FUSION (PLIF) 1 LEVEL;  Surgeon: Erline Levine, MD;  Location: [facility];  Service: Neurosurgery;  Laterality: N/A;   NASAL SEPTOPLASTY W/ TURBINOPLASTY N/A 12/07/2017   Procedure: NASAL SEPTOPLASTY WITH BILATERAL TURBINATE REDUCTION;  Surgeon: Melida Quitter, MD;  Location: [facility];  Service: ENT;  Laterality: N/A;   ORIF ANKLE FRACTURE Right    TONSILLECTOMY  06/2018   UPPER GI ENDOSCOPY     No Known Allergies Current Facility-Administered Medications on File Prior to Encounter  Medication Dose Route Frequency Provider Last Rate Last Dose   lactated ringers infusion    Continuous PRN Laretta Alstrom, CRNA       Current Outpatient Medications on File Prior to Encounter  Medication Sig Dispense Refill   acetaminophen (TYLENOL) 500 MG tablet Take 500 mg by mouth every 6 (six) hours as needed for mild pain or moderate pain.     amitriptyline (ELAVIL) 50 MG tablet Take 50 mg by mouth at bedtime.     cetirizine (ZYRTEC) 10 MG chewable tablet Chew 1 tablet (10 mg total) by mouth daily. 20 tablet 0   fluticasone (FLONASE) 50 MCG/ACT nasal spray Place 2 sprays into both nostrils daily. (Patient taking differently: Place 2 sprays into both nostrils daily as needed for allergies. ) 16 g 0   levothyroxine (SYNTHROID,  LEVOTHROID) 25 MCG tablet Take 25 mcg by mouth daily before breakfast.      lisinopril (ZESTRIL) 20 MG tablet Take 20 mg by mouth daily.     montelukast (SINGULAIR) 10 MG tablet Take 10 mg by mouth daily.     omeprazole (PRILOSEC) 20 MG capsule Take 20 mg by mouth daily.  3   ondansetron (ZOFRAN) 4 MG tablet Take 4-8 mg by mouth daily as needed for nausea or vomiting.     SUMAtriptan (IMITREX) 50 MG tablet Take 50 mg by mouth every 2 (two) hours as needed for migraine.      Social History   Socioeconomic History   Marital  status: Married    Spouse name: Not on file   Number of children: Not on file   Years of education: Not on file   Highest education level: Not on file  Occupational History   Not on file  Social Needs   Financial resource strain: Not on file   Food insecurity    Worry: Not on file    Inability: Not on file   Transportation needs    Medical: Not on file    Non-medical: Not on file  Tobacco Use   Smoking status: Never Smoker   Smokeless tobacco: Never Used  Substance and Sexual Activity   Alcohol use: No    Frequency: Never   Drug use: No   Sexual activity: Yes    Birth control/protection: None  Lifestyle   Physical activity    Days per week: Not on file    Minutes per session: Not on file   Stress: Not on file  Relationships   Social connections    Talks on phone: Not on file    Gets together: Not on file    Attends religious service: Not on file    Active member of club or organization: Not on file    Attends meetings of clubs or organizations: Not on file    Relationship status: Not on file   Intimate partner violence    Fear of current or ex partner: Not on file    Emotionally abused: Not on file    Physically abused: Not on file    Forced sexual activity: Not on file  Other Topics Concern   Not on file  Social History Narrative   Not on file   Family History  Problem Relation Age of Onset   Hypertension Mother    Diabetes Mother    Heart failure Father    Hypertension Father    Diabetes Father    Heart failure Other    Breast cancer Paternal Grandmother     OBJECTIVE:  Vitals:   09/22/19 1809  BP: 117/84  Pulse: (!) 115  Resp: 20  Temp: 97.9 F (36.6 C)  SpO2: 95%    General appearance: ALERT; in no acute distress.  Head: NCAT Eyes: EOMI grossly Lungs: Normal respiratory effort; CTAB over anterior chest CV: RRR; Radial pulses 2+ bilaterally. Cap refill < 2 seconds Musculoskeletal: LT wrist, RT finger, LT  knee Inspection: Small superficial cut to medial aspect of RT fifth finger; otherwise skin warm, dry, clear and intact without obvious erythema, effusion, or ecchymosis.  Palpation: TTP over lateral wrist, TTP over distal tip of RT fifth finger, TTP over lateral joint line ROM: FROM active and passive Strength: 5/5 elbow flexion, 5/5 elbow extension, 4+/5 wrist extension, 5/5 wrist flexion, 5/5 grip strength, 4+/5 knee flexion, 4+/5 knee extension Stability: Anterior/ posterior drawer  intact Skin: warm and dry Neurologic: Ambulates without difficulty; Sensation intact about the upper/ lower extremities Psychological: alert and cooperative; normal mood and affect  DIAGNOSTIC STUDIES:  Dg Wrist Complete Left  Result Date: 09/22/2019 CLINICAL DATA:  Larey SeatFell today with wrist pain EXAM: LEFT WRIST - COMPLETE 3+ VIEW COMPARISON:  None. FINDINGS: No acute or traumatic finding. 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 IMPRESSION: No traumatic finding. Electronically Signed   By: Paulina FusiMark  Shogry M.D.   On: 09/22/2019 18:36   Dg Knee Complete 4 Views Left  Result Date: 09/22/2019 CLINICAL DATA:  Left knee pain after fall. EXAM: LEFT KNEE - COMPLETE 4+ VIEW COMPARISON:  MRI left knee dated June 10, 2018. FINDINGS: No acute fracture or dislocation. No joint effusion. Unchanged mild medial compartment joint space narrowing. Bone mineralization is normal. Soft tissues are unremarkable. IMPRESSION: 1.  No acute osseous abnormality. 2. Unchanged mild medial compartment degenerative changes. Electronically Signed   By: Obie DredgeWilliam T Derry M.D.   On: 09/22/2019 18:49   Dg Finger Little Right  Result Date: 09/22/2019 CLINICAL DATA:  Post fall with right little finger pain. EXAM: RIGHT LITTLE FINGER 2+V COMPARISON:  None. FINDINGS: There is no evidence of fracture or dislocation. Minimal degenerative spurring at the distal interphalangeal joint. There is no evidence of arthropathy or other focal bone  abnormality. Soft tissues are unremarkable. IMPRESSION: No fracture or subluxation of the little finger. Electronically Signed   By: Narda RutherfordMelanie  Sanford M.D.   On: 09/22/2019 18:30     X-rays negative for bony abnormalities including fracture, or dislocation.  No soft tissue swelling.    I have reviewed the x-rays myself and the radiologist interpretation. I am in agreement with the radiologist interpretation.     ASSESSMENT & PLAN:  1. Acute pain of left knee   2. Left wrist pain   3. Finger pain, right   4. Fall, initial encounter    Meds ordered this encounter  Medications   naproxen (NAPROSYN) 500 MG tablet    Sig: Take 1 tablet (500 mg total) by mouth 2 (two) times daily.    Dispense:  30 tablet    Refill:  0    Order Specific Question:   Supervising Provider    Answer:   Eustace MooreELSON, YVONNE SUE [[id number]]   X-rays did not show fractures or dislocations All injuries appear muscular at this time Continue conservative management of rest, ice, and elevation Take naproxen as needed for pain relief (may cause abdominal discomfort, ulcers, and GI bleeds avoid taking with other NSAIDs) Follow up with orthopedist for recheck if symptoms do not improve in 1-2 weeks, you may need a MRI Return or go to the ER if you have any new or worsening symptoms (fever, chills, redness, swelling, bruising, deformity, weakness, numbness/ tingling, symptoms do not improve with medication, etc...)    Reviewed expectations re: course of current medical issues. Questions answered. Outlined signs and symptoms indicating need for more acute intervention. Patient verbalized understanding. After Visit Summary given.    Rennis HardingWurst, Khushboo Chuck, PA-C 09/22/19 1901

## 2019-09-22 NOTE — ED Triage Notes (Signed)
Pt present with c/ o left knee pain left wrist pain and right 1st finger pain after trip and fall this morning

## 2019-09-22 NOTE — Discharge Instructions (Signed)
X-rays did not show fractures or dislocations All injuries appear muscular at this time Continue conservative management of rest, ice, and elevation Take naproxen as needed for pain relief (may cause abdominal discomfort, ulcers, and GI bleeds avoid taking with other NSAIDs) Follow up with orthopedist for recheck if symptoms do not improve in 1-2 weeks, you may need a MRI Return or go to the ER if you have any new or worsening symptoms (fever, chills, redness, swelling, bruising, deformity, weakness, numbness/ tingling, symptoms do not improve with medication, etc...)

## 2019-09-23 ENCOUNTER — Other Ambulatory Visit (HOSPITAL_COMMUNITY)
Admission: RE | Admit: 2019-09-23 | Discharge: 2019-09-23 | Disposition: A | Discharge status: Home / Self Care | Payer: BC Managed Care – PPO | Source: Ambulatory Visit | Attending: General Surgery | Admitting: General Surgery

## 2019-09-23 DIAGNOSIS — Z01812 Encounter for preprocedural laboratory examination: Secondary | ICD-10-CM | POA: Diagnosis not present

## 2019-09-23 DIAGNOSIS — Z20828 Contact with and (suspected) exposure to other viral communicable diseases: Secondary | ICD-10-CM | POA: Diagnosis not present

## 2019-09-25 LAB — NOVEL CORONAVIRUS, NAA (HOSP ORDER, SEND-OUT TO REF LAB; TAT 18-24 HRS): SARS-CoV-2, NAA: NOT DETECTED

## 2019-09-26 MED ORDER — BUPIVACAINE LIPOSOME 1.3 % IJ SUSP
20.0000 mL | Freq: Once | INTRAMUSCULAR | Status: DC
  Filled 2019-09-26: qty 20

## 2019-09-26 NOTE — Anesthesia Preprocedure Evaluation (Addendum)
Anesthesia Evaluation  Patient identified by MRN, date of birth, ID band Patient awake    Reviewed: Allergy & Precautions, NPO status , Patient's Chart, lab work & pertinent test results  History of Anesthesia Complications (+) PONV  Airway Mallampati: II  TM Distance: >3 FB Neck ROM: Full    Dental no notable dental hx. (+) Teeth Intact, Dental Advisory Given   Pulmonary    Pulmonary exam normal breath sounds clear to auscultation       Cardiovascular Exercise Tolerance: Good Normal cardiovascular exam Rhythm:Regular Rate:Normal     Neuro/Psych  Headaches, PSYCHIATRIC DISORDERS Depression    GI/Hepatic Neg liver ROS, GERD  Medicated,  Endo/Other  Hypothyroidism Morbid obesity  Renal/GU K+ 4.3 Cr 0.94     Musculoskeletal  (+) Arthritis ,   Abdominal (+) + obese,   Peds  Hematology Hgb 13.8 Plt 311   Anesthesia Other Findings   Reproductive/Obstetrics negative OB ROS                            Anesthesia Physical Anesthesia Plan  ASA: II  Anesthesia Plan: General   Post-op Pain Management:    Induction: Intravenous  PONV Risk Score and Plan: 3 and Treatment may vary due to age or medical condition, Scopolamine patch - Pre-op, Promethazine and Dexamethasone  Airway Management Planned: Oral ETT  Additional Equipment: None  Intra-op Plan:   Post-operative Plan: Extubation in OR  Informed Consent: I have reviewed the patients History and Physical, chart, labs and discussed the procedure including the risks, benefits and alternatives for the proposed anesthesia with the patient or authorized representative who has indicated his/her understanding and acceptance.     Dental advisory given  Plan Discussed with: CRNA  Anesthesia Plan Comments: (GA w lidocaine infusion + Ketamine)       Anesthesia Quick Evaluation

## 2019-09-27 ENCOUNTER — Encounter (HOSPITAL_COMMUNITY): Payer: Self-pay

## 2019-09-27 ENCOUNTER — Encounter (HOSPITAL_COMMUNITY)
Admission: RE | Disposition: A | Discharge status: Home / Self Care | Payer: Self-pay | Source: Home / Self Care | Attending: General Surgery

## 2019-09-27 ENCOUNTER — Inpatient Hospital Stay (HOSPITAL_COMMUNITY): Payer: BC Managed Care – PPO | Admitting: Physician Assistant

## 2019-09-27 ENCOUNTER — Inpatient Hospital Stay (HOSPITAL_COMMUNITY): Payer: BC Managed Care – PPO | Admitting: Registered Nurse

## 2019-09-27 ENCOUNTER — Inpatient Hospital Stay (HOSPITAL_COMMUNITY)
Admission: RE | Admit: 2019-09-27 | Discharge: 2019-09-28 | DRG: 621 | Disposition: A | Discharge status: Home / Self Care | Payer: BC Managed Care – PPO | Attending: General Surgery | Admitting: General Surgery

## 2019-09-27 ENCOUNTER — Other Ambulatory Visit: Payer: Self-pay

## 2019-09-27 DIAGNOSIS — E8881 Metabolic syndrome: Secondary | ICD-10-CM | POA: Diagnosis present

## 2019-09-27 DIAGNOSIS — K449 Diaphragmatic hernia without obstruction or gangrene: Secondary | ICD-10-CM | POA: Diagnosis present

## 2019-09-27 DIAGNOSIS — Z79890 Hormone replacement therapy (postmenopausal): Secondary | ICD-10-CM | POA: Diagnosis not present

## 2019-09-27 DIAGNOSIS — M5136 Other intervertebral disc degeneration, lumbar region: Secondary | ICD-10-CM | POA: Diagnosis present

## 2019-09-27 DIAGNOSIS — K760 Fatty (change of) liver, not elsewhere classified: Secondary | ICD-10-CM | POA: Diagnosis present

## 2019-09-27 DIAGNOSIS — Z6839 Body mass index (BMI) 39.0-39.9, adult: Secondary | ICD-10-CM | POA: Diagnosis not present

## 2019-09-27 DIAGNOSIS — K219 Gastro-esophageal reflux disease without esophagitis: Secondary | ICD-10-CM | POA: Diagnosis present

## 2019-09-27 DIAGNOSIS — Z79899 Other long term (current) drug therapy: Secondary | ICD-10-CM | POA: Diagnosis not present

## 2019-09-27 DIAGNOSIS — Z20828 Contact with and (suspected) exposure to other viral communicable diseases: Secondary | ICD-10-CM | POA: Diagnosis present

## 2019-09-27 DIAGNOSIS — E039 Hypothyroidism, unspecified: Secondary | ICD-10-CM | POA: Diagnosis present

## 2019-09-27 DIAGNOSIS — E669 Obesity, unspecified: Secondary | ICD-10-CM | POA: Diagnosis present

## 2019-09-27 DIAGNOSIS — I10 Essential (primary) hypertension: Secondary | ICD-10-CM | POA: Diagnosis present

## 2019-09-27 DIAGNOSIS — Z9884 Bariatric surgery status: Secondary | ICD-10-CM

## 2019-09-27 DIAGNOSIS — F329 Major depressive disorder, single episode, unspecified: Secondary | ICD-10-CM | POA: Diagnosis present

## 2019-09-27 DIAGNOSIS — M48062 Spinal stenosis, lumbar region with neurogenic claudication: Secondary | ICD-10-CM | POA: Diagnosis present

## 2019-09-27 DIAGNOSIS — F419 Anxiety disorder, unspecified: Secondary | ICD-10-CM | POA: Diagnosis present

## 2019-09-27 DIAGNOSIS — Z90710 Acquired absence of both cervix and uterus: Secondary | ICD-10-CM

## 2019-09-27 HISTORY — PX: LAPAROSCOPIC GASTRIC SLEEVE RESECTION: SHX5895

## 2019-09-27 LAB — TYPE AND SCREEN
ABO/RH(D): O POS
Antibody Screen: NEGATIVE

## 2019-09-27 LAB — HEMOGLOBIN AND HEMATOCRIT, BLOOD
HCT: 35.8 % — ABNORMAL LOW (ref 36.0–46.0)
Hemoglobin: 11.9 g/dL — ABNORMAL LOW (ref 12.0–15.0)

## 2019-09-27 LAB — PREGNANCY, URINE: Preg Test, Ur: NEGATIVE

## 2019-09-27 SURGERY — GASTRECTOMY, SLEEVE, LAPAROSCOPIC
Anesthesia: General

## 2019-09-27 MED ORDER — KCL IN DEXTROSE-NACL 20-5-0.45 MEQ/L-%-% IV SOLN
INTRAVENOUS | Status: DC
  Administered 2019-09-27 – 2019-09-28 (×2): via INTRAVENOUS
  Filled 2019-09-27 (×2): qty 1000

## 2019-09-27 MED ORDER — ONDANSETRON HCL 4 MG/2ML IJ SOLN: 4.0000 mg | Freq: Once | INTRAMUSCULAR | Status: DC | PRN

## 2019-09-27 MED ORDER — PROMETHAZINE HCL 25 MG/ML IJ SOLN: 12.5000 mg | Freq: Four times a day (QID) | INTRAMUSCULAR | Status: DC | PRN

## 2019-09-27 MED ORDER — SUGAMMADEX SODIUM 500 MG/5ML IV SOLN
INTRAVENOUS | Status: DC | PRN
  Administered 2019-09-27: 500 mg via INTRAVENOUS

## 2019-09-27 MED ORDER — ONDANSETRON HCL 4 MG/2ML IJ SOLN
INTRAMUSCULAR | Status: DC | PRN
  Administered 2019-09-27: 4 mg via INTRAVENOUS

## 2019-09-27 MED ORDER — MIDAZOLAM HCL 2 MG/2ML IJ SOLN
INTRAMUSCULAR | Status: AC
  Filled 2019-09-27: qty 2

## 2019-09-27 MED ORDER — LIDOCAINE 2% (20 MG/ML) 5 ML SYRINGE
INTRAMUSCULAR | Status: DC | PRN
  Administered 2019-09-27: 100 mg via INTRAVENOUS

## 2019-09-27 MED ORDER — ACETAMINOPHEN 500 MG PO TABS
1000.0000 mg | ORAL_TABLET | Freq: Three times a day (TID) | ORAL | Status: DC
  Administered 2019-09-28 (×2): 1000 mg via ORAL
  Filled 2019-09-27 (×2): qty 2

## 2019-09-27 MED ORDER — ROCURONIUM BROMIDE 10 MG/ML (PF) SYRINGE
PREFILLED_SYRINGE | INTRAVENOUS | Status: AC
  Filled 2019-09-27: qty 10

## 2019-09-27 MED ORDER — CHLORHEXIDINE GLUCONATE 4 % EX LIQD: 60.0000 mL | Freq: Once | CUTANEOUS | Status: DC

## 2019-09-27 MED ORDER — BUPIVACAINE LIPOSOME 1.3 % IJ SUSP
INTRAMUSCULAR | Status: DC | PRN
  Administered 2019-09-27: 20 mL

## 2019-09-27 MED ORDER — FENTANYL CITRATE (PF) 250 MCG/5ML IJ SOLN
INTRAMUSCULAR | Status: AC
  Filled 2019-09-27: qty 5

## 2019-09-27 MED ORDER — ENOXAPARIN SODIUM 30 MG/0.3ML ~~LOC~~ SOLN
30.0000 mg | Freq: Two times a day (BID) | SUBCUTANEOUS | Status: DC
  Administered 2019-09-27 – 2019-09-28 (×2): 30 mg via SUBCUTANEOUS
  Filled 2019-09-27 (×2): qty 0.3

## 2019-09-27 MED ORDER — ONDANSETRON HCL 4 MG/2ML IJ SOLN
4.0000 mg | Freq: Four times a day (QID) | INTRAMUSCULAR | Status: DC | PRN
  Administered 2019-09-27: 4 mg via INTRAVENOUS
  Filled 2019-09-27: qty 2

## 2019-09-27 MED ORDER — SCOPOLAMINE 1 MG/3DAYS TD PT72
1.0000 | MEDICATED_PATCH | TRANSDERMAL | Status: DC
  Administered 2019-09-27: 1.5 mg via TRANSDERMAL
  Filled 2019-09-27: qty 1

## 2019-09-27 MED ORDER — LACTATED RINGERS IR SOLN
Status: DC | PRN
  Administered 2019-09-27: 1000 mL

## 2019-09-27 MED ORDER — FENTANYL CITRATE (PF) 100 MCG/2ML IJ SOLN
INTRAMUSCULAR | Status: AC
  Filled 2019-09-27: qty 4

## 2019-09-27 MED ORDER — SIMETHICONE 80 MG PO CHEW: 80.0000 mg | CHEWABLE_TABLET | Freq: Four times a day (QID) | ORAL | Status: DC | PRN

## 2019-09-27 MED ORDER — LACTATED RINGERS IV SOLN
INTRAVENOUS | Status: DC
  Administered 2019-09-27: 13:00:00 via INTRAVENOUS

## 2019-09-27 MED ORDER — FENTANYL CITRATE (PF) 250 MCG/5ML IJ SOLN
INTRAMUSCULAR | Status: DC | PRN
  Administered 2019-09-27: 75 ug via INTRAVENOUS

## 2019-09-27 MED ORDER — PANTOPRAZOLE SODIUM 40 MG IV SOLR
40.0000 mg | Freq: Every day | INTRAVENOUS | Status: DC
  Administered 2019-09-27: 40 mg via INTRAVENOUS
  Filled 2019-09-27: qty 40

## 2019-09-27 MED ORDER — MIDAZOLAM HCL 5 MG/5ML IJ SOLN
INTRAMUSCULAR | Status: DC | PRN
  Administered 2019-09-27: 2 mg via INTRAVENOUS

## 2019-09-27 MED ORDER — PHENYLEPHRINE 40 MCG/ML (10ML) SYRINGE FOR IV PUSH (FOR BLOOD PRESSURE SUPPORT)
PREFILLED_SYRINGE | INTRAVENOUS | Status: DC | PRN
  Administered 2019-09-27: 80 ug via INTRAVENOUS
  Administered 2019-09-27: 120 ug via INTRAVENOUS
  Administered 2019-09-27: 80 ug via INTRAVENOUS
  Administered 2019-09-27: 120 ug via INTRAVENOUS

## 2019-09-27 MED ORDER — MORPHINE SULFATE (PF) 2 MG/ML IV SOLN
1.0000 mg | INTRAVENOUS | Status: DC | PRN
  Administered 2019-09-27: 1 mg via INTRAVENOUS
  Filled 2019-09-27: qty 1

## 2019-09-27 MED ORDER — KETAMINE HCL 10 MG/ML IJ SOLN
INTRAMUSCULAR | Status: AC
  Filled 2019-09-27: qty 1

## 2019-09-27 MED ORDER — KETAMINE HCL 10 MG/ML IJ SOLN
INTRAMUSCULAR | Status: DC | PRN
  Administered 2019-09-27: 40 mg via INTRAVENOUS

## 2019-09-27 MED ORDER — OXYCODONE HCL 5 MG/5ML PO SOLN: 5.0000 mg | Freq: Once | ORAL | Status: DC | PRN

## 2019-09-27 MED ORDER — ALBUMIN HUMAN 5 % IV SOLN
INTRAVENOUS | Status: DC | PRN
  Administered 2019-09-27 (×2): via INTRAVENOUS

## 2019-09-27 MED ORDER — AMITRIPTYLINE HCL 50 MG PO TABS
50.0000 mg | ORAL_TABLET | Freq: Every day | ORAL | Status: DC
  Administered 2019-09-27: 50 mg via ORAL
  Filled 2019-09-27 (×2): qty 1

## 2019-09-27 MED ORDER — 0.9 % SODIUM CHLORIDE (POUR BTL) OPTIME
TOPICAL | Status: DC | PRN
  Administered 2019-09-27: 1000 mL

## 2019-09-27 MED ORDER — PHENYLEPHRINE HCL (PRESSORS) 10 MG/ML IV SOLN
INTRAVENOUS | Status: AC
  Filled 2019-09-27: qty 1

## 2019-09-27 MED ORDER — ALBUMIN HUMAN 5 % IV SOLN
INTRAVENOUS | Status: AC
  Filled 2019-09-27: qty 250

## 2019-09-27 MED ORDER — OXYCODONE HCL 5 MG/5ML PO SOLN
5.0000 mg | Freq: Four times a day (QID) | ORAL | Status: DC | PRN
  Administered 2019-09-28 (×2): 5 mg via ORAL
  Filled 2019-09-27 (×2): qty 5

## 2019-09-27 MED ORDER — LIDOCAINE 2% (20 MG/ML) 5 ML SYRINGE
INTRAMUSCULAR | Status: AC
  Filled 2019-09-27: qty 5

## 2019-09-27 MED ORDER — ACETAMINOPHEN 10 MG/ML IV SOLN: 1000.0000 mg | Freq: Once | INTRAVENOUS | Status: DC | PRN

## 2019-09-27 MED ORDER — GABAPENTIN 300 MG PO CAPS
300.0000 mg | ORAL_CAPSULE | ORAL | Status: AC
  Administered 2019-09-27: 300 mg via ORAL
  Filled 2019-09-27: qty 1

## 2019-09-27 MED ORDER — APREPITANT 40 MG PO CAPS
40.0000 mg | ORAL_CAPSULE | ORAL | Status: AC
  Administered 2019-09-27: 40 mg via ORAL
  Filled 2019-09-27: qty 1

## 2019-09-27 MED ORDER — ACETAMINOPHEN 160 MG/5ML PO SOLN: 1000.0000 mg | Freq: Three times a day (TID) | ORAL | Status: DC

## 2019-09-27 MED ORDER — ACETAMINOPHEN 500 MG PO TABS
1000.0000 mg | ORAL_TABLET | ORAL | Status: AC
  Administered 2019-09-27: 1000 mg via ORAL
  Filled 2019-09-27: qty 2

## 2019-09-27 MED ORDER — LIDOCAINE HCL 2 % IJ SOLN
INTRAMUSCULAR | Status: AC
  Filled 2019-09-27: qty 20

## 2019-09-27 MED ORDER — STERILE WATER FOR IRRIGATION IR SOLN
Status: DC | PRN
  Administered 2019-09-27: 1000 mL

## 2019-09-27 MED ORDER — AMLODIPINE BESYLATE 5 MG PO TABS
5.0000 mg | ORAL_TABLET | Freq: Every day | ORAL | Status: DC
  Administered 2019-09-27 – 2019-09-28 (×2): 5 mg via ORAL
  Filled 2019-09-27 (×2): qty 1

## 2019-09-27 MED ORDER — SUCCINYLCHOLINE CHLORIDE 200 MG/10ML IV SOSY
PREFILLED_SYRINGE | INTRAVENOUS | Status: DC | PRN
  Administered 2019-09-27: 100 mg via INTRAVENOUS

## 2019-09-27 MED ORDER — PHENYLEPHRINE HCL-NACL 10-0.9 MG/250ML-% IV SOLN
INTRAVENOUS | Status: DC | PRN
  Administered 2019-09-27: 50 ug/min via INTRAVENOUS

## 2019-09-27 MED ORDER — SODIUM CHLORIDE 0.9 % IV SOLN
2.0000 g | INTRAVENOUS | Status: AC
  Administered 2019-09-27: 2 g via INTRAVENOUS
  Filled 2019-09-27: qty 2

## 2019-09-27 MED ORDER — SODIUM CHLORIDE (PF) 0.9 % IJ SOLN
INTRAMUSCULAR | Status: AC
  Filled 2019-09-27: qty 50

## 2019-09-27 MED ORDER — DEXAMETHASONE SODIUM PHOSPHATE 10 MG/ML IJ SOLN
INTRAMUSCULAR | Status: AC
  Filled 2019-09-27: qty 1

## 2019-09-27 MED ORDER — ENALAPRILAT 1.25 MG/ML IV SOLN
1.2500 mg | Freq: Four times a day (QID) | INTRAVENOUS | Status: AC | PRN
  Filled 2019-09-27: qty 1

## 2019-09-27 MED ORDER — DEXAMETHASONE SODIUM PHOSPHATE 4 MG/ML IJ SOLN: 4.0000 mg | INTRAMUSCULAR | Status: DC

## 2019-09-27 MED ORDER — PHENYLEPHRINE 40 MCG/ML (10ML) SYRINGE FOR IV PUSH (FOR BLOOD PRESSURE SUPPORT)
PREFILLED_SYRINGE | INTRAVENOUS | Status: AC
  Filled 2019-09-27: qty 10

## 2019-09-27 MED ORDER — ROCURONIUM BROMIDE 10 MG/ML (PF) SYRINGE
PREFILLED_SYRINGE | INTRAVENOUS | Status: DC | PRN
  Administered 2019-09-27: 60 mg via INTRAVENOUS
  Administered 2019-09-27: 10 mg via INTRAVENOUS

## 2019-09-27 MED ORDER — LIDOCAINE 20MG/ML (2%) 15 ML SYRINGE OPTIME
INTRAMUSCULAR | Status: DC | PRN
  Administered 2019-09-27: 1.5 mg/kg/h via INTRAVENOUS

## 2019-09-27 MED ORDER — SUCCINYLCHOLINE CHLORIDE 200 MG/10ML IV SOSY
PREFILLED_SYRINGE | INTRAVENOUS | Status: AC
  Filled 2019-09-27: qty 10

## 2019-09-27 MED ORDER — LEVOTHYROXINE SODIUM 25 MCG PO TABS
25.0000 ug | ORAL_TABLET | Freq: Every day | ORAL | Status: DC
  Administered 2019-09-28: 25 ug via ORAL
  Filled 2019-09-27: qty 1

## 2019-09-27 MED ORDER — ENSURE MAX PROTEIN PO LIQD
2.0000 [oz_av] | ORAL | Status: DC
  Administered 2019-09-28 (×3): 2 [oz_av] via ORAL

## 2019-09-27 MED ORDER — FLUTICASONE PROPIONATE 50 MCG/ACT NA SUSP: 2.0000 | Freq: Every day | NASAL | Status: DC | PRN

## 2019-09-27 MED ORDER — OXYCODONE HCL 5 MG PO TABS: 5.0000 mg | ORAL_TABLET | Freq: Once | ORAL | Status: DC | PRN

## 2019-09-27 MED ORDER — PROPOFOL 10 MG/ML IV BOLUS
INTRAVENOUS | Status: DC | PRN
  Administered 2019-09-27: 130 mg via INTRAVENOUS

## 2019-09-27 MED ORDER — SODIUM CHLORIDE (PF) 0.9 % IJ SOLN
INTRAMUSCULAR | Status: DC | PRN
  Administered 2019-09-27: 50 mL

## 2019-09-27 MED ORDER — GABAPENTIN 100 MG PO CAPS
200.0000 mg | ORAL_CAPSULE | Freq: Two times a day (BID) | ORAL | Status: DC
  Administered 2019-09-28: 200 mg via ORAL
  Filled 2019-09-27: qty 2

## 2019-09-27 MED ORDER — DEXAMETHASONE SODIUM PHOSPHATE 10 MG/ML IJ SOLN
INTRAMUSCULAR | Status: DC | PRN
  Administered 2019-09-27: 10 mg via INTRAVENOUS

## 2019-09-27 MED ORDER — ONDANSETRON HCL 4 MG/2ML IJ SOLN
INTRAMUSCULAR | Status: AC
  Filled 2019-09-27: qty 2

## 2019-09-27 MED ORDER — LISINOPRIL 20 MG PO TABS
20.0000 mg | ORAL_TABLET | Freq: Every day | ORAL | Status: DC
  Administered 2019-09-28: 20 mg via ORAL
  Filled 2019-09-27: qty 1

## 2019-09-27 MED ORDER — DIPHENHYDRAMINE HCL 50 MG/ML IJ SOLN: 12.5000 mg | Freq: Three times a day (TID) | INTRAMUSCULAR | Status: DC | PRN

## 2019-09-27 MED ORDER — SUMATRIPTAN SUCCINATE 50 MG PO TABS
50.0000 mg | ORAL_TABLET | ORAL | Status: DC | PRN
  Filled 2019-09-27: qty 1

## 2019-09-27 MED ORDER — FENTANYL CITRATE (PF) 100 MCG/2ML IJ SOLN
25.0000 ug | INTRAMUSCULAR | Status: DC | PRN
  Administered 2019-09-27 (×2): 50 ug via INTRAVENOUS

## 2019-09-27 MED ORDER — HEPARIN SODIUM (PORCINE) 5000 UNIT/ML IJ SOLN
5000.0000 [IU] | INTRAMUSCULAR | Status: AC
  Administered 2019-09-27: 5000 [IU] via SUBCUTANEOUS
  Filled 2019-09-27: qty 1

## 2019-09-27 SURGICAL SUPPLY — 84 items
ADH SKN CLS APL DERMABOND .7 (GAUZE/BANDAGES/DRESSINGS) ×1
APL PRP STRL LF DISP 70% ISPRP (MISCELLANEOUS) ×2
APL SKNCLS STERI-STRIP NONHPOA (GAUZE/BANDAGES/DRESSINGS) ×1
APL SRG 32X5 SNPLK LF DISP (MISCELLANEOUS)
APL SWBSTK 6 STRL LF DISP (MISCELLANEOUS)
APPLICATOR COTTON TIP 6 STRL (MISCELLANEOUS) IMPLANT
APPLICATOR COTTON TIP 6IN STRL (MISCELLANEOUS)
APPLIER CLIP ROT 10 11.4 M/L (STAPLE)
APPLIER CLIP ROT 13.4 12 LRG (CLIP)
APR CLP LRG 13.4X12 ROT 20 MLT (CLIP)
APR CLP MED LRG 11.4X10 (STAPLE)
BENZOIN TINCTURE PRP APPL 2/3 (GAUZE/BANDAGES/DRESSINGS) ×2 IMPLANT
BLADE SURG SZ11 CARB STEEL (BLADE) ×2 IMPLANT
BNDG ADH 1X3 SHEER STRL LF (GAUZE/BANDAGES/DRESSINGS) ×12 IMPLANT
BNDG ADH THN 3X1 STRL LF (GAUZE/BANDAGES/DRESSINGS) ×6
CABLE HIGH FREQUENCY MONO STRZ (ELECTRODE) ×2 IMPLANT
CHLORAPREP W/TINT 26 (MISCELLANEOUS) ×4 IMPLANT
CLIP APPLIE ROT 10 11.4 M/L (STAPLE) IMPLANT
CLIP APPLIE ROT 13.4 12 LRG (CLIP) IMPLANT
COVER SURGICAL LIGHT HANDLE (MISCELLANEOUS) ×2 IMPLANT
COVER WAND RF STERILE (DRAPES) IMPLANT
DECANTER SPIKE VIAL GLASS SM (MISCELLANEOUS) ×2 IMPLANT
DERMABOND ADVANCED (GAUZE/BANDAGES/DRESSINGS) ×1
DERMABOND ADVANCED .7 DNX12 (GAUZE/BANDAGES/DRESSINGS) IMPLANT
DEVICE SUT QUICK LOAD TK 5 (STAPLE) ×1 IMPLANT
DEVICE SUT TI-KNOT TK 5X26 (MISCELLANEOUS) IMPLANT
DEVICE SUTURE ENDOST 10MM (ENDOMECHANICALS) ×1 IMPLANT
DISSECTOR BLUNT TIP ENDO 5MM (MISCELLANEOUS) IMPLANT
DRAPE UTILITY XL STRL (DRAPES) ×4 IMPLANT
ELECT L-HOOK LAP 45CM DISP (ELECTROSURGICAL)
ELECT PENCIL ROCKER SW 15FT (MISCELLANEOUS) IMPLANT
ELECT REM PT RETURN 15FT ADLT (MISCELLANEOUS) ×2 IMPLANT
ELECTRODE L-HOOK LAP 45CM DISP (ELECTROSURGICAL) IMPLANT
GAUZE SPONGE 2X2 8PLY STRL LF (GAUZE/BANDAGES/DRESSINGS) IMPLANT
GAUZE SPONGE 4X4 12PLY STRL (GAUZE/BANDAGES/DRESSINGS) IMPLANT
GLOVE BIO SURGEON STRL SZ7.5 (GLOVE) ×2 IMPLANT
GLOVE INDICATOR 8.0 STRL GRN (GLOVE) ×2 IMPLANT
GOWN STRL REUS W/TWL XL LVL3 (GOWN DISPOSABLE) ×6 IMPLANT
GRASPER SUT TROCAR 14GX15 (MISCELLANEOUS) ×2 IMPLANT
HOVERMATT SINGLE USE (MISCELLANEOUS) ×2 IMPLANT
KIT BASIN OR (CUSTOM PROCEDURE TRAY) ×2 IMPLANT
KIT TURNOVER KIT A (KITS) IMPLANT
MARKER SKIN DUAL TIP RULER LAB (MISCELLANEOUS) ×2 IMPLANT
NDL SPNL 22GX3.5 QUINCKE BK (NEEDLE) ×1 IMPLANT
NEEDLE SPNL 22GX3.5 QUINCKE BK (NEEDLE) ×2 IMPLANT
PACK UNIVERSAL I (CUSTOM PROCEDURE TRAY) ×2 IMPLANT
PENCIL SMOKE EVACUATOR (MISCELLANEOUS) IMPLANT
RELOAD STAPLE 60 3.6 BLU REG (STAPLE) ×1 IMPLANT
RELOAD STAPLE 60 3.8 GOLD REG (STAPLE) IMPLANT
RELOAD STAPLE 60 4.1 GRN THCK (STAPLE) ×1 IMPLANT
RELOAD STAPLE 60 BLK VRY/THCK (STAPLE) IMPLANT
RELOAD STAPLER 60MM BLK (STAPLE) IMPLANT
RELOAD STAPLER BLUE 60MM (STAPLE) ×3 IMPLANT
RELOAD STAPLER GOLD 60MM (STAPLE) ×1 IMPLANT
RELOAD STAPLER GREEN 60MM (STAPLE) ×1 IMPLANT
SCISSORS LAP 5X45 EPIX DISP (ENDOMECHANICALS) IMPLANT
SEALANT SURGICAL APPL DUAL CAN (MISCELLANEOUS) IMPLANT
SET IRRIG TUBING LAPAROSCOPIC (IRRIGATION / IRRIGATOR) ×2 IMPLANT
SET TUBE SMOKE EVAC HIGH FLOW (TUBING) ×2 IMPLANT
SHEARS HARMONIC ACE PLUS 45CM (MISCELLANEOUS) ×2 IMPLANT
SLEEVE GASTRECTOMY 40FR VISIGI (MISCELLANEOUS) ×2 IMPLANT
SLEEVE XCEL OPT CAN 5 100 (ENDOMECHANICALS) ×6 IMPLANT
SOL ANTI FOG 6CC (MISCELLANEOUS) ×1 IMPLANT
SOLUTION ANTI FOG 6CC (MISCELLANEOUS) ×1
SPONGE GAUZE 2X2 STER 10/PKG (GAUZE/BANDAGES/DRESSINGS)
SPONGE LAP 18X18 RF (DISPOSABLE) ×2 IMPLANT
STAPLER ECHELON BIOABSB 60 FLE (MISCELLANEOUS) ×8 IMPLANT
STAPLER ECHELON LONG 60 440 (INSTRUMENTS) ×2 IMPLANT
STAPLER RELOAD 60MM BLK (STAPLE)
STAPLER RELOAD BLUE 60MM (STAPLE) ×6
STAPLER RELOAD GOLD 60MM (STAPLE) ×2
STAPLER RELOAD GREEN 60MM (STAPLE) ×2
STRIP CLOSURE SKIN 1/2X4 (GAUZE/BANDAGES/DRESSINGS) ×2 IMPLANT
SUT MNCRL AB 4-0 PS2 18 (SUTURE) ×2 IMPLANT
SUT SURGIDAC NAB ES-9 0 48 120 (SUTURE) ×1 IMPLANT
SUT VICRYL 0 TIES 12 18 (SUTURE) ×2 IMPLANT
SYR 20ML LL LF (SYRINGE) ×2 IMPLANT
SYR 50ML LL SCALE MARK (SYRINGE) ×2 IMPLANT
TOWEL OR 17X26 10 PK STRL BLUE (TOWEL DISPOSABLE) ×2 IMPLANT
TOWEL OR NON WOVEN STRL DISP B (DISPOSABLE) ×2 IMPLANT
TROCAR BLADELESS 15MM (ENDOMECHANICALS) ×2 IMPLANT
TROCAR BLADELESS OPT 5 100 (ENDOMECHANICALS) ×2 IMPLANT
TUBING CONNECTING 10 (TUBING) ×4 IMPLANT
TUBING ENDO SMARTCAP (MISCELLANEOUS) ×2 IMPLANT

## 2019-09-27 NOTE — Discharge Instructions (Signed)
° ° ° °GASTRIC BYPASS/SLEEVE ° Home Care Instructions ° ° These instructions are to help you care for yourself when you go home. ° °Call: If you have any problems. °• Call [phone] and ask for the surgeon on call °• If you need immediate help, come to the ER at [facility].  °• Tell the ER staff that you are a new post-op gastric bypass or gastric sleeve patient °  °Signs and symptoms to report: • Severe vomiting or nausea °o If you cannot keep down clear liquids for longer than 1 day, call your surgeon  °• Abdominal pain that does not get better after taking your pain medication °• Fever over 100.4° F with chills °• Heart beating over 100 beats a minute °• Shortness of breath at rest °• Chest pain °•  Redness, swelling, drainage, or foul odor at incision (surgical) sites °•  If your incisions open or pull apart °• Swelling or pain in calf (lower leg) °• Diarrhea (Loose bowel movements that happen often), frequent watery, uncontrolled bowel movements °• Constipation, (no bowel movements for 3 days) if this happens: Pick one °o Milk of Magnesia, 2 tablespoons by mouth, 3 times a day for 2 days if needed °o Stop taking Milk of Magnesia once you have a bowel movement °o Call your doctor if constipation continues °Or °o Miralax  (instead of Milk of Magnesia) following the label instructions °o Stop taking Miralax once you have a bowel movement °o Call your doctor if constipation continues °• Anything you think is not normal °  °Normal side effects after surgery: • Unable to sleep at night or unable to focus °• Irritability or moody °• Being tearful (crying) or depressed °These are common complaints, possibly related to your anesthesia medications that put you to sleep, stress of surgery, and change in lifestyle.  This usually goes away a few weeks after surgery.  If these feelings continue, call your primary care doctor. °  °Wound Care: You may have surgical glue, steri-strips, or staples over your incisions after  surgery °• Surgical glue:  Looks like a clear film over your incisions and will wear off a little at a time °• Steri-strips: Strips of tape over your incisions. You may notice a yellowish color on the skin under the steri-strips. This is used to make the   steri-strips stick better. Do not pull the steri-strips off - let them fall off °• Staples: Staples may be removed before you leave the hospital °o If you go home with staples, call [facility], [phone] at for an appointment with your surgeon’s nurse to have staples removed 10 days after surgery. °• Showering: You may shower two (2) days after your surgery unless your surgeon tells you differently °o Wash gently around incisions with warm soapy water, rinse well, and gently pat dry  °o No tub baths until staples are removed, steri-strips fall off or glue is gone.  °  °Medications: • Medications should be liquid or crushed if larger than the size of a dime °• Extended release pills (medication that release a little bit at a time through the day) should NOT be crushed or cut. (examples include XL, ER, DR, SR) °• Depending on the size and number of medications you take, you may need to space (take a few throughout the day)/change the time you take your medications so that you do not over-fill your pouch (smaller stomach) °• Make sure you follow-up with your primary care doctor to   make medication changes needed during rapid weight loss and life-style changes °• If you have diabetes, follow up with the doctor that orders your diabetes medication(s) within one week after surgery and check your blood sugar regularly. °• Do not drive while taking prescription pain medication  °• It is ok to take Tylenol by the bottle instructions with your pain medicine or instead of your pain medicine as needed.  DO NOT TAKE NSAIDS (EXAMPLES OF NSAIDS:  IBUPROFREN/ NAPROXEN)  °Diet:                    First 2 Weeks ° You will see the dietician t about two (2) weeks  after your surgery. The dietician will increase the types of foods you can eat if you are handling liquids well: °• If you have severe vomiting or nausea and cannot keep down clear liquids lasting longer than 1 day, call your surgeon @ ([phone]) °Protein Shake °• Drink at least 2 ounces of shake 5-6 times per day °• Each serving of protein shakes (usually 8 - 12 ounces) should have: °o 15 grams of protein  °o And no more than 5 grams of carbohydrate  °• Goal for protein each day: °o Men = 80 grams per day °o Women = 60 grams per day °• Protein powder may be added to fluids such as non-fat milk or Lactaid milk or unsweetened Soy/Almond milk (limit to 35 grams added protein powder per serving) ° °Hydration °• Slowly increase the amount of water and other clear liquids as tolerated (See Acceptable Fluids) °• Slowly increase the amount of protein shake as tolerated  °•  Sip fluids slowly and throughout the day.  Do not use straws. °• May use sugar substitutes in small amounts (no more than 6 - 8 packets per day; i.e. Splenda) ° °Fluid Goal °• The first goal is to drink at least 8 ounces of protein shake/drink per day (or as directed by the nutritionist); some examples of protein shakes are Syntrax Nectar, Adkins Advantage, EAS Edge HP, and Unjury. See handout from pre-op Bariatric Education Class: °o Slowly increase the amount of protein shake you drink as tolerated °o You may find it easier to slowly sip shakes throughout the day °o It is important to get your proteins in first °• Your fluid goal is to drink 64 - 100 ounces of fluid daily °o It may take a few weeks to build up to this °• 32 oz (or more) should be clear liquids  °And  °• 32 oz (or more) should be full liquids (see below for examples) °• Liquids should not contain sugar, caffeine, or carbonation ° °Clear Liquids: °• Water or Sugar-free flavored water (i.e. Fruit H2O, Propel) °• Decaffeinated coffee or tea (sugar-free) °• Crystal Lite, Wyler’s Lite,  Minute Maid Lite °• Sugar-free Jell-O °• Bouillon or broth °• Sugar-free Popsicle:   *Less than 20 calories each; Limit 1 per day ° °Full Liquids: °Protein Shakes/Drinks + 2 choices per day of other full liquids °• Full liquids must be: °o No More Than 15 grams of Carbs per serving  °o No More Than 3 grams of Fat per serving °• Strained low-fat cream soup (except Cream of Potato or Tomato) °• Non-Fat milk °• Fat-free Lactaid Milk °• Unsweetened Soy Or Unsweetened Almond Milk °• Low Sugar yogurt (Dannon Lite & Fit, Greek yogurt; Oikos Triple Zero; Chobani Simply 100; Yoplait 100 calorie Greek - No Fruit on the Bottom) ° °  °Vitamins   and Minerals • Start 1 day after surgery unless otherwise directed by your surgeon °• 2 Chewable Bariatric Specific Multivitamin / Multimineral Supplement with iron (Example: Bariatric Advantage Multi EA) °• Chewable Calcium with Vitamin D-3 °(Example: 3 Chewable Calcium Plus 600 with Vitamin D-3) °o Take 500 mg three (3) times a day for a total of 1500 mg each day °o Do not take all 3 doses of calcium at one time as it may cause constipation, and you can only absorb 500 mg  at a time  °o Do not mix multivitamins containing iron with calcium supplements; take 2 hours apart °• Menstruating women and those with a history of anemia (a blood disease that causes weakness) may need extra iron °o Talk with your doctor to see if you need more iron °• Do not stop taking or change any vitamins or minerals until you talk to your dietitian or surgeon °• Your Dietitian and/or surgeon must approve all vitamin and mineral supplements °  °Activity and Exercise: Limit your physical activity as instructed by your doctor.  It is important to continue walking at home.  During this time, use these guidelines: °• Do not lift anything greater than ten (10) pounds for at least two (2) weeks °• Do not go back to work or drive until your surgeon says you can °• You may have sex when you feel comfortable  °o It is  VERY important for female patients to use a reliable birth control method; fertility often increases after surgery  °o All hormonal birth control will be ineffective for 30 days after surgery due to medications given during surgery a barrier method must be used. °o Do not get pregnant for at least 18 months °• Start exercising as soon as your doctor tells you that you can °o Make sure your doctor approves any physical activity °• Start with a simple walking program °• Walk 5-15 minutes each day, 7 days per week.  °• Slowly increase until you are walking 30-45 minutes per day °Consider joining our BELT program. [phone] or email belt@[employer].edu °  °Special Instructions Things to remember: °• Use your CPAP when sleeping if this applies to you ° °• [facility] has two free Bariatric Surgery Support Groups that meet monthly °o The 3rd Thursday of each month, 6 pm, [facility]  °o The 2nd Friday of each month, 11:45 am in the private dining room in the basement of [facility] °• It is very important to keep all follow up appointments with your surgeon, dietitian, primary care physician, and behavioral health practitioner °• Routine follow up schedule with your surgeon include appointments at 2-3 weeks, 6-8 weeks, 6 months, and 1 year at a minimum.  Your surgeon may request to see you more often.   °o After the first year, please follow up with your bariatric surgeon and dietitian at least once a year in order to maintain best weight loss results °[facility]: [phone] °[facility]: [phone] °Bariatric Nurse Coordinator: [phone] °  °   Reviewed and Endorsed  °by  Patient Education Committee, June, 2016 °Edits Approved: Aug, 2018 ° ° ° °

## 2019-09-27 NOTE — Anesthesia Procedure Notes (Signed)
Procedure Name: Intubation Date/Time: 09/27/2019 2:33 PM Performed by: Talbot Grumbling, CRNA Pre-anesthesia Checklist: Patient identified, Emergency Drugs available, Suction available and Patient being monitored Patient Re-evaluated:Patient Re-evaluated prior to induction Oxygen Delivery Method: Circle system utilized Preoxygenation: Pre-oxygenation with 100% oxygen Induction Type: IV induction Ventilation: Mask ventilation without difficulty Laryngoscope Size: Mac and 3 Grade View: Grade I Tube type: Oral Tube size: 7.5 mm Number of attempts: 1 Airway Equipment and Method: Stylet Placement Confirmation: ETT inserted through vocal cords under direct vision,  positive ETCO2 and breath sounds checked- equal and bilateral Secured at: 21 cm Tube secured with: Tape Dental Injury: Teeth and Oropharynx as per pre-operative assessment

## 2019-09-27 NOTE — Interval H&P Note (Signed)
History and Physical Interval Note:  09/27/2019 2:07 PM  Hannah Lynch  has presented today for surgery, with the diagnosis of Morbid Obesity.  The various methods of treatment have been discussed with the patient and family. After consideration of risks, benefits and other options for treatment, the patient has consented to  Procedure(s): LAPAROSCOPIC GASTRIC SLEEVE RESECTION, Upper Endo, Eras Pathway (N/A) as a surgical intervention.  The patient's history has been reviewed, patient examined, no change in status, stable for surgery.  I have reviewed the patient's chart and labs.  Questions were answered to the patient's satisfaction.     Greer Pickerel

## 2019-09-27 NOTE — Progress Notes (Signed)
MD on call notified of pts pulse being 122. Orders were given. MD ordered that pt could begin water at this time.  Rosie Fate

## 2019-09-27 NOTE — Progress Notes (Signed)
PHARMACY CONSULT FOR:  Risk Assessment for Post-Discharge VTE Following Bariatric Surgery  Post-Discharge VTE Risk Assessment: This patient's probability of 30-day post-discharge VTE is increased due to the factors marked:   Female    Age >/=60 years    BMI >/=50 kg/m2    CHF    Dyspnea at Rest    Paraplegia  X  Non-gastric-band surgery    Operation Time >/=3 hr    Return to OR     Length of Stay >/= 3 d      Hx of VTE   Hypercoagulable condition   Significant venous stasis   Predicted probability of 30-day post-discharge VTE: 0.16%  Other patient-specific factors to consider:  none  Recommendation for Discharge: No pharmacologic prophylaxis post-discharge  Hannah Lynch is a 48 y.o. female who underwent laparoscopic sleeve gastrectomy 09/27/2019   No Known Allergies  Patient Measurements: Height: 5\' 4"  (162.6 cm) Weight: 219 lb 6.4 oz (99.5 kg) IBW/kg (Calculated) : 54.7 Body mass index is 37.66 kg/m.  No results for input(s): WBC, HGB, HCT, PLT, APTT, CREATININE, LABCREA, CREATININE, CREAT24HRUR, MG, PHOS, ALBUMIN, PROT, ALBUMIN, AST, ALT, ALKPHOS, BILITOT, BILIDIR, IBILI in the last 72 hours. Estimated Creatinine Clearance: 84.8 mL/min (by C-G formula based on SCr of 0.94 mg/dL).    Past Medical History:  Diagnosis Date  . Anxiety   . Degenerative disc disease, lumbar   . Degenerative disc disease, lumbar 2012   lumbar and cervical region  . Depression   . Family history of adverse reaction to anesthesia    pt's mother has hx. of post-op N/V  . Headache   . Hypothyroidism   . IBS (irritable bowel syndrome)   . Nasal septal deviation 11/2017  . Nasal turbinate hypertrophy 11/2017  . PONV (postoperative nausea and vomiting)   . Snoring 11/2017     Medications Prior to Admission  Medication Sig Dispense Refill Last Dose  . acetaminophen (TYLENOL) 500 MG tablet Take 500 mg by mouth every 6 (six) hours as needed for mild pain or moderate pain.   Past Month at  Unknown time  . amitriptyline (ELAVIL) 50 MG tablet Take 50 mg by mouth at bedtime.   09/26/2019 at Unknown time  . cetirizine (ZYRTEC) 10 MG chewable tablet Chew 1 tablet (10 mg total) by mouth daily. 20 tablet 0   . levothyroxine (SYNTHROID, LEVOTHROID) 25 MCG tablet Take 25 mcg by mouth daily before breakfast.    09/27/2019 at 0830  . lisinopril (ZESTRIL) 20 MG tablet Take 20 mg by mouth daily.   09/27/2019 at 0830  . montelukast (SINGULAIR) 10 MG tablet Take 10 mg by mouth daily.   09/27/2019 at 0830  . omeprazole (PRILOSEC) 20 MG capsule Take 20 mg by mouth daily.  3 09/27/2019 at 0830  . fluticasone (FLONASE) 50 MCG/ACT nasal spray Place 2 sprays into both nostrils daily. (Patient taking differently: Place 2 sprays into both nostrils daily as needed for allergies. ) 16 g 0 More than a month at Unknown time  . naproxen (NAPROSYN) 500 MG tablet Take 1 tablet (500 mg total) by mouth 2 (two) times daily. 30 tablet 0   . ondansetron (ZOFRAN) 4 MG tablet Take 4-8 mg by mouth daily as needed for nausea or vomiting.   More than a month at Unknown time  . SUMAtriptan (IMITREX) 50 MG tablet Take 50 mg by mouth every 2 (two) hours as needed for migraine.    More than a month at Unknown time  Herby Abraham, Pharm.D [phone] 09/27/2019 4:17 PM

## 2019-09-27 NOTE — Op Note (Signed)
Preoperative diagnosis: laparoscopic sleeve gastrectomy  Postoperative diagnosis: Same   Procedure: Upper endoscopy   Surgeon: Luke Kinsinger, M.D.  Anesthesia: Gen.   Indications for procedure: This patient was undergoing a laparoscopic sleeve gastrectomy.   Description of procedure: The endoscopy was placed in the mouth and into the oropharynx and under endoscopic vision it was advanced to the esophagogastric junction. The pouch was insufflated and no bleeding or bubbles were seen. The GEJ was identified at 40cm from the teeth.  No bleeding or leaks were detected. The scope was withdrawn without difficulty.   Luke Kinsinger, M.D. General, Bariatric, & Minimally Invasive Surgery [facility]    

## 2019-09-27 NOTE — Transfer of Care (Signed)
Immediate Anesthesia Transfer of Care Note  Patient: Hannah Lynch  Procedure(s) Performed: LAPAROSCOPIC GASTRIC SLEEVE RESECTION, HIATAL HERNIA REPARI ;Upper Endo, Eras Pathway (N/A )  Patient Location: PACU  Anesthesia Type:General  Level of Consciousness: awake, alert , oriented and patient cooperative  Airway & Oxygen Therapy: Patient Spontanous Breathing and Patient connected to face mask oxygen  Post-op Assessment: Report given to RN, Post -op Vital signs reviewed and stable and Patient moving all extremities X 4  Post vital signs: stable  Last Vitals:  Vitals Value Taken Time  BP 115/71 09/27/19 1605  Temp    Pulse 95 09/27/19 1608  Resp 17 09/27/19 1608  SpO2 99 % 09/27/19 1608  Vitals shown include unvalidated device data.  Last Pain:  Vitals:   09/27/19 1219  TempSrc:   PainSc: 0-No pain         Complications: No apparent anesthesia complications

## 2019-09-27 NOTE — Op Note (Signed)
09/27/2019 Hannah Lynch [birth year] [id number]   PRE-OPERATIVE DIAGNOSIS:     Obesity BMI 37.66   Lumbar stenosis with neurogenic claudication   GERD (gastroesophageal reflux disease)   Essential hypertension   Fatty liver   Metabolic syndrome  POST-OPERATIVE DIAGNOSIS:  Same +sliding hiatal hernia.   PROCEDURE:  Procedure(s): LAPAROSCOPIC SLEEVE GASTRECTOMY WITH HIATAL HERNIA REPAIR UPPER GI ENDOSCOPY  SURGEON:  Surgeon(s): Atilano Ina, MD FACS FASMBS  ASSISTANTS: Feliciana Rossetti MD FACS  ANESTHESIA:   general  DRAINS: none   BOUGIE: 40 fr ViSiGi  LOCAL MEDICATIONS USED:   Exparel  EBL: minimal  SPECIMEN:  Source of Specimen:  Greater curvature of stomach  DISPOSITION OF SPECIMEN:  PATHOLOGY  COUNTS:  YES  INDICATION FOR PROCEDURE: This is a very pleasant 48 y.o.-year-old morbidly obese female who has had unsuccessful attempts for sustained weight loss. The patient presents today for a planned laparoscopic sleeve gastrectomy with upper endoscopy. We have discussed the risk and benefits of the procedure extensively preoperatively. Please see my separate notes.  PROCEDURE: After obtaining informed consent and receiving 5000 units of subcutaneous heparin, the patient was brought to the operating room at [facility] and placed supine on the operating room table. General endotracheal anesthesia was established. Sequential compression devices were placed. A orogastric tube was placed. The patient's abdomen was prepped and draped in the usual standard surgical fashion. The patient received preoperative IV antibiotic. A surgical timeout was performed. ERAS protocol used.   Access to the abdomen was achieved using a 5 mm 0 laparoscope thru a 5 mm trocar In the left upper Quadrant 2 fingerbreadths below the left subcostal margin using the Optiview technique. Pneumoperitoneum was smoothly established up to 15 mm of mercury. The laparoscope was advanced and the abdominal cavity  was surveilled. The patient was then placed in reverse Trendelenburg.   A 5 mm trocar was placed slightly above and to the left of the umbilicus under direct visualization.  The [facility] liver retractor was placed under the left lobe of the liver through a 5 mm trocar incision site in the subxiphoid position. A 5 mm trocar was placed in the lateral right upper quadrant along with a 15 mm trocar in the mid right abdomen. A final 5 mm trocar was placed in the lateral LUQ.  All under direct visualization after exparel had been infiltrated in bilateral lateral upper abdominal walls as a TAP block.  The stomach was inspected. It was completely decompressed and the orogastric tube was removed.  There was no small anterior dimple that was obviously visible. Her preop UGI showed a small sliding hiatal hernia.The gastrohepatic ligament was incised with harmonic scalpel. The right crus was identified. We identified the crossing fat along the right crus. The adipose tissue just above this area was incised with harmonic scalpel. I then bluntly dissected out this area and identified the left crus. There was evidence of a sliding hiatal hernia. I then mobilized the esophagus. The left and right crus were further mobilized with blunt dissection. I was then able to reapproximate the left and right crus with 0 Ethibond using an Endostitch suture device and securing it with a titanium tyknot.   We identified the pylorus and measured 6 cm proximal to the pylorus and identified an area of where we would start taking down the short gastric vessels. Harmonic scalpel was used to take down the short gastric vessels along the greater curvature of the stomach. We were able to enter the  lesser sac. We continued to march along the greater curvature of the stomach taking down the short gastrics. As we approached the gastrosplenic ligament we took care in this area not to injure the spleen. We were able to take down the entire  gastrosplenic ligament. We then mobilized the fundus away from the left crus of diaphragm. There were not any significant posterior gastric avascular attachments. This left the stomach completely mobilized. No vessels had been taken down along the lesser curvature of the stomach.  We then reidentified the pylorus. A 40Fr ViSiGi was then placed in the oropharynx and advanced down into the stomach and placed in the distal antrum and positioned along the lesser curvature. It was placed under suction which secured the 40Fr ViSiGi in place along the lesser curve. Then using the Ethicon echelon 60 mm stapler with a green load with Seamguard, I placed a stapler along the antrum approximately 5 cm from the pylorus. The stapler was angled so that there is ample room at the angularis incisura. I then fired the first staple load after inspecting it posteriorly to ensure adequate space both anteriorly and posteriorly. At this point I still was not completely past the angularis so with a gold load with Seamguard, I placed the stapler in position just inside the prior stapleline. We then rotated the stomach to insure that there was adequate anteriorly as well as posteriorly. The stapler was then fired.  At this point I started using 60 mm blue load staple cartridges with Seamguard. The echelon stapler was then repositioned with a 60 mm blue load with Seamguard and we continued to march up along the ViSiGi. My assistant was holding traction along the greater curvature stomach along the cauterized short gastric vessels ensuring that the stomach was symmetrically retracted. Prior to each firing of the staple, we rotated the stomach to ensure that there is adequate stomach left.  As we approached the fundus, I used 60 mm blue cartridge with Seamguard aiming  lateral to the GE junction after mobilizing some of the esophageal fat pad.  The sleeve was inspected. There is no evidence of cork screw. The staple line appeared hemostatic.  The CRNA inflated the ViSiGi to the green zone and the upper abdomen was flooded with saline. There were no bubbles. The sleeve was decompressed and the ViSiGi removed. My assistant scrubbed out and performed an upper endoscopy. The sleeve easily distended with air and the scope was easily advanced to the pylorus. There is no evidence of internal bleeding or cork screwing. There was no narrowing at the angularis. There is no evidence of bubbles. Please see his operative note for further details. The gastric sleeve was decompressed and the endoscope was removed.  The greater curvature the stomach was grasped with a laparoscopic grasper and removed from the 15 mm trocar site.  The liver retractor was removed. I then closed the 15 mm trocar site with 1 interrupted 0 Vicryl sutures through the fascia using the endoclose. The closure was viewed laparoscopically and it was airtight. Remaining Exparel was then infiltrated in the preperitoneal spaces around the trocar sites. Pneumoperitoneum was released. All trocar sites were closed with a 4-0 Monocryl in a subcuticular fashion followed by the application of benzoin, steri-strips, and bandaids. The patient was extubated and taken to the recovery room in stable condition. All needle, instrument, and sponge counts were correct x2. There are no immediate complications  (1) 60 mm green with Seamguard (1) 60 mm gold with seamguard (3)  60 mm blue with 2 seamguard  PLAN OF CARE: Admit to inpatient   PATIENT DISPOSITION:  PACU - hemodynamically stable.   Delay start of Pharmacological VTE agent (>24hrs) due to surgical blood loss or risk of bleeding:  no  Leighton Ruff. Redmond Pulling, MD, FACS FASMBS General, Bariatric, & Minimally Invasive Surgery [facility], [place]

## 2019-09-28 ENCOUNTER — Encounter (HOSPITAL_COMMUNITY): Payer: Self-pay | Admitting: General Surgery

## 2019-09-28 LAB — CBC WITH DIFFERENTIAL/PLATELET
Abs Immature Granulocytes: 0.07 10*3/uL (ref 0.00–0.07)
Basophils Absolute: 0 10*3/uL (ref 0.0–0.1)
Basophils Relative: 0 %
Eosinophils Absolute: 0 10*3/uL (ref 0.0–0.5)
Eosinophils Relative: 0 %
HCT: 40.5 % (ref 36.0–46.0)
Hemoglobin: 13.3 g/dL (ref 12.0–15.0)
Immature Granulocytes: 1 %
Lymphocytes Relative: 9 %
Lymphs Abs: 1.2 10*3/uL (ref 0.7–4.0)
MCH: 31.4 pg (ref 26.0–34.0)
MCHC: 32.8 g/dL (ref 30.0–36.0)
MCV: 95.7 fL (ref 80.0–100.0)
Monocytes Absolute: 0.2 10*3/uL (ref 0.1–1.0)
Monocytes Relative: 2 %
Neutro Abs: 10.8 10*3/uL — ABNORMAL HIGH (ref 1.7–7.7)
Neutrophils Relative %: 88 %
Platelets: 323 10*3/uL (ref 150–400)
RBC: 4.23 MIL/uL (ref 3.87–5.11)
RDW: 12.8 % (ref 11.5–15.5)
WBC: 12.2 10*3/uL — ABNORMAL HIGH (ref 4.0–10.5)
nRBC: 0 % (ref 0.0–0.2)

## 2019-09-28 LAB — COMPREHENSIVE METABOLIC PANEL
ALT: 57 U/L — ABNORMAL HIGH (ref 0–44)
AST: 41 U/L (ref 15–41)
Albumin: 4.1 g/dL (ref 3.5–5.0)
Alkaline Phosphatase: 75 U/L (ref 38–126)
Anion gap: 9 (ref 5–15)
BUN: 12 mg/dL (ref 6–20)
CO2: 22 mmol/L (ref 22–32)
Calcium: 9 mg/dL (ref 8.9–10.3)
Chloride: 102 mmol/L (ref 98–111)
Creatinine, Ser: 0.89 mg/dL (ref 0.44–1.00)
GFR calc Af Amer: >60 mL/min (ref >60)
GFR calc non Af Amer: >60 mL/min (ref >60)
Glucose, Bld: 176 mg/dL — ABNORMAL HIGH (ref 70–99)
Potassium: 4.5 mmol/L (ref 3.5–5.1)
Sodium: 133 mmol/L — ABNORMAL LOW (ref 135–145)
Total Bilirubin: 0.6 mg/dL (ref 0.3–1.2)
Total Protein: 7.8 g/dL (ref 6.5–8.1)

## 2019-09-28 MED ORDER — GABAPENTIN 100 MG PO CAPS: 200.0000 mg | ORAL_CAPSULE | Freq: Two times a day (BID) | ORAL | 0 refills | Status: DC

## 2019-09-28 MED ORDER — ACETAMINOPHEN 500 MG PO TABS: 1000.0000 mg | ORAL_TABLET | Freq: Three times a day (TID) | ORAL | 0 refills | Status: AC

## 2019-09-28 MED ORDER — ONDANSETRON 4 MG PO TBDP: 4.0000 mg | ORAL_TABLET | Freq: Four times a day (QID) | ORAL | 0 refills | Status: DC | PRN

## 2019-09-28 MED ORDER — TRAMADOL HCL 50 MG PO TABS: 50.0000 mg | ORAL_TABLET | Freq: Four times a day (QID) | ORAL | 0 refills | Status: DC | PRN

## 2019-09-28 NOTE — Progress Notes (Signed)
1 Day Post-Op   Subjective/Chief Complaint: Feels good. Water went well.  Getting ready to start shake walking   Objective: Vital signs in last 24 hours: Temp:  [97.6 F (36.4 C)-98.7 F (37.1 C)] 97.7 F (36.5 C) (11/18 0556) Pulse Rate:  [95-122] 108 (11/18 0556) Resp:  [12-23] 18 (11/18 0556) BP: (106-140)/(69-94) 120/78 (11/18 0556) SpO2:  [91 %-99 %] 93 % (11/18 0556) Weight:  [99.5 kg] 99.5 kg (11/17 1206)    Intake/Output from previous day: 11/17 0701 - 11/18 0700 In: 2747.2 [P.O.:180; I.V.:2067.2; IV Piggyback:500] Out: 2325 [Urine:2300; Blood:25] Intake/Output this shift: No intake/output data recorded.  Alert, nontoxic cta Reg Soft, approp TTP, incisions ok.  No edema  Lab Results:  Recent Labs    09/27/19 1617 09/28/19 0237  WBC  --  12.2*  HGB 11.9* 13.3  HCT 35.8* 40.5  PLT  --  323   BMET Recent Labs    09/28/19 0237  NA 133*  K 4.5  CL 102  CO2 22  GLUCOSE 176*  BUN 12  CREATININE 0.89  CALCIUM 9.0   PT/INR No results for input(s): LABPROT, INR in the last 72 hours. ABG No results for input(s): PHART, HCO3 in the last 72 hours.  Invalid input(s): PCO2, PO2  Studies/Results: No results found.  Anti-infectives: Anti-infectives (From admission, onward)   Start     Dose/Rate Route Frequency Ordered Stop   09/27/19 1200  cefoTEtan (CEFOTAN) 2 g in sodium chloride 0.9 % 100 mL IVPB     2 g 200 mL/hr over 30 Minutes Intravenous On call to O.R. 09/27/19 1151 09/27/19 1456      Assessment/Plan: s/p Procedure(s): LAPAROSCOPIC GASTRIC SLEEVE RESECTION, HIATAL HERNIA REPARI ;Upper Endo, Eras Pathway (N/A)  HR was elevated preop so don't think this is reliable indicator.  Wbc and temp ok.  Cont postop diet.  Start dc teaching.   Hannah Ruff. Redmond Pulling, Hannah Lynch, Hannah Lynch General, Bariatric, & Minimally Invasive Surgery [facility], [place]   LOS: 1 day    Greer Pickerel 09/28/2019

## 2019-09-28 NOTE — Progress Notes (Signed)
Patient alert and oriented, pain is controlled. Patient is tolerating fluids, advanced to protein shake today, patient is tolerating well.  Reviewed Gastric sleeve discharge instructions with patient and patient is able to articulate understanding.  Provided information on BELT program, Support Group and [facility] outpatient pharmacy. All questions answered, will continue to monitor.  Total fluid intake 600  Per dehydration protocol call back one week post op

## 2019-09-28 NOTE — Progress Notes (Signed)
Pt has completed her 12 oz of water successfully. Pt will start protein shake this am. Pt is ambulating, using IS routinely and pain is controlled. Hannah Lynch

## 2019-09-28 NOTE — Anesthesia Postprocedure Evaluation (Signed)
Anesthesia Post Note  Patient: Anieya Helman Longnecker  Procedure(s) Performed: LAPAROSCOPIC GASTRIC SLEEVE RESECTION, HIATAL HERNIA REPARI ;Upper Endo, Eras Pathway (N/A )     Patient location during evaluation: PACU Anesthesia Type: General Level of consciousness: awake and alert Pain management: pain level controlled Vital Signs Assessment: post-procedure vital signs reviewed and stable Respiratory status: spontaneous breathing, nonlabored ventilation, respiratory function stable and patient connected to nasal cannula oxygen Cardiovascular status: blood pressure returned to baseline and stable Postop Assessment: no apparent nausea or vomiting Anesthetic complications: no    Last Vitals:  Vitals:   09/27/19 2308 09/28/19 0155  BP:  106/69  Pulse: (!) 122 (!) 122  Resp: 16 20  Temp:  36.8 C  SpO2: 94% 91%    Last Pain:  Vitals:   09/28/19 0402  TempSrc:   PainSc: 7                  Barnet Glasgow

## 2019-09-28 NOTE — Progress Notes (Signed)
Pt was discharged home today. Instructions were reviewed with patient, and questions were answered. Pt was taken to main entrance via wheelchair by NT.  

## 2019-09-29 LAB — SURGICAL PATHOLOGY

## 2019-09-29 NOTE — Discharge Summary (Signed)
Physician Discharge Summary  Hannah Lynch SRP:[id number] DOB: 1971/05/13 DOA: 09/27/2019  PCP: Chesley Noon, MD  Admit date: 09/27/2019 Discharge date: 09/28/2019  Recommendations for Outpatient Follow-up:   Follow-up Information    Greer Pickerel, MD. Go on 10/21/2019.   Specialty: General Surgery Why: at 4 Contact information: [address] [place] [address] [phone]        Carlena Hurl, PA-C. Go on 11/17/2019.   Specialty: General Surgery Why: at [address] information: [place]  [place] [address] [phone]          Discharge Diagnoses:  Principal Problem:   Obesity (BMI 30-39.9) Active Problems:   Lumbar stenosis with neurogenic claudication   GERD (gastroesophageal reflux disease)   Essential hypertension   Fatty liver   Metabolic syndrome   Surgical Procedure: Laparoscopic Sleeve Gastrectomy, upper endoscopy  Discharge Condition: Good Disposition: Home  Diet recommendation: Postoperative sleeve gastrectomy diet (liquids only)  Filed Weights   09/27/19 1206  Weight: 99.5 kg     Hospital Course:  The patient was admitted for a planned laparoscopic sleeve gastrectomy. Please see operative note. Preoperatively the patient was given 5000 units of subcutaneous heparin for DVT prophylaxis. Postoperative prophylactic Lovenox dosing was started on the evening of postoperative day 0. ERAS protocol was used. On the evening of postoperative day 0, the patient was started on water and ice chips. On postoperative day 1 the patient had no fever or tachycardia and was tolerating water in their diet was gradually advanced throughout the day. The patient was ambulating without difficulty. Their vital signs are stable without fever or tachycardia. Their hemoglobin had remained stable.  The patient had received discharge instructions and counseling. They were deemed stable for discharge and had met discharge criteria   Discharge  Instructions  Discharge Instructions    Ambulate hourly while awake   Complete by: As directed    Call MD for:  difficulty breathing, headache or visual disturbances   Complete by: As directed    Call MD for:  persistant dizziness or light-headedness   Complete by: As directed    Call MD for:  persistant nausea and vomiting   Complete by: As directed    Call MD for:  redness, tenderness, or signs of infection (pain, swelling, redness, odor or green/yellow discharge around incision site)   Complete by: As directed    Call MD for:  severe uncontrolled pain   Complete by: As directed    Call MD for:  temperature >101 F   Complete by: As directed    Diet bariatric full liquid   Complete by: As directed    Discharge instructions   Complete by: As directed    See bariatric discharge instructions   Incentive spirometry   Complete by: As directed    Perform hourly while awake     Allergies as of 09/28/2019   No Known Allergies     Medication List    STOP taking these medications   naproxen 500 MG tablet Commonly known as: NAPROSYN   ondansetron 4 MG tablet Commonly known as: ZOFRAN     TAKE these medications   acetaminophen 500 MG tablet Commonly known as: TYLENOL Take 2 tablets (1,000 mg total) by mouth every 8 (eight) hours for 5 days. What changed:   how much to take  when to take this  reasons to take this   amitriptyline 50 MG tablet Commonly known as: ELAVIL Take 50 mg  by mouth at bedtime.   cetirizine 10 MG chewable tablet Commonly known as: ZYRTEC Chew 1 tablet (10 mg total) by mouth daily.   fluticasone 50 MCG/ACT nasal spray Commonly known as: FLONASE Place 2 sprays into both nostrils daily. What changed:   when to take this  reasons to take this   gabapentin 100 MG capsule Commonly known as: NEURONTIN Take 2 capsules (200 mg total) by mouth every 12 (twelve) hours.   levothyroxine 25 MCG tablet Commonly known as: SYNTHROID Take 25 mcg by  mouth daily before breakfast.   lisinopril 20 MG tablet Commonly known as: ZESTRIL Take 20 mg by mouth daily. Notes to patient: Monitor Blood Pressure Daily and keep a log for primary care physician.  You may need to make changes to your medications with rapid weight loss.     montelukast 10 MG tablet Commonly known as: SINGULAIR Take 10 mg by mouth daily.   omeprazole 20 MG capsule Commonly known as: PRILOSEC Take 20 mg by mouth daily.   ondansetron 4 MG disintegrating tablet Commonly known as: ZOFRAN-ODT Take 1 tablet (4 mg total) by mouth every 6 (six) hours as needed for nausea or vomiting.   SUMAtriptan 50 MG tablet Commonly known as: IMITREX Take 50 mg by mouth every 2 (two) hours as needed for migraine.   traMADol 50 MG tablet Commonly known as: ULTRAM Take 1 tablet (50 mg total) by mouth every 6 (six) hours as needed (pain).      Follow-up Information    Greer Pickerel, MD. Go on 10/21/2019.   Specialty: General Surgery Why: at 4 Contact information: [address] [place] [address] [phone]        Carlena Hurl, PA-C. Go on 11/17/2019.   Specialty: General Surgery Why: at 10 Contact information: [place] [address] [phone]            The results of significant diagnostics from this hospitalization (including imaging, microbiology, ancillary and laboratory) are listed below for reference.    Significant Diagnostic Studies: Dg Wrist Complete Left  Result Date: 09/22/2019 CLINICAL DATA:  [place] [address] today with wrist pain EXAM: LEFT WRIST - COMPLETE 3+ VIEW COMPARISON:  None. FINDINGS: No acute or traumatic finding. 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 IMPRESSION: No traumatic finding. Electronically Signed   By: Nelson Chimes M.D.   On: 09/22/2019 18:36   Dg Knee Complete 4 Views Left  Result Date: 09/22/2019 CLINICAL DATA:  Left knee pain after fall. EXAM: LEFT KNEE - COMPLETE 4+ VIEW  COMPARISON:  MRI left knee dated June 10, 2018. FINDINGS: No acute fracture or dislocation. No joint effusion. Unchanged mild medial compartment joint space narrowing. Bone mineralization is normal. Soft tissues are unremarkable. IMPRESSION: 1.  No acute osseous abnormality. 2. Unchanged mild medial compartment degenerative changes. Electronically Signed   By: Titus Dubin M.D.   On: 09/22/2019 18:49   Dg Finger Little Right  Result Date: 09/22/2019 CLINICAL DATA:  Post fall with right little finger pain. EXAM: RIGHT LITTLE FINGER 2+V COMPARISON:  None. FINDINGS: There is no evidence of fracture or dislocation. Minimal degenerative spurring at the distal interphalangeal joint. There is no evidence of arthropathy or other focal bone abnormality. Soft tissues are unremarkable. IMPRESSION: No fracture or subluxation of the little finger. Electronically Signed   By: Keith Rake M.D.   On: 09/22/2019 18:30    Labs: Basic Metabolic Panel: Recent Labs  Lab 09/28/19 0237  NA 133*  K 4.5  CL 102  CO2 22  GLUCOSE 176*  BUN 12  CREATININE 0.89  CALCIUM 9.0   Liver Function Tests: Recent Labs  Lab 09/28/19 0237  AST 41  ALT 57*  ALKPHOS 75  BILITOT 0.6  PROT 7.8  ALBUMIN 4.1    CBC: Recent Labs  Lab 09/27/19 1617 09/28/19 0237  WBC  --  12.2*  NEUTROABS  --  10.8*  HGB 11.9* 13.3  HCT 35.8* 40.5  MCV  --  95.7  PLT  --  323    CBG: No results for input(s): GLUCAP in the last 168 hours.  Principal Problem:   Obesity (BMI 30-39.9) Active Problems:   Lumbar stenosis with neurogenic claudication   GERD (gastroesophageal reflux disease)   Essential hypertension   Fatty liver   Metabolic syndrome   Time coordinating discharge: 15 min  Signed:  Gayland Curry, MD [facility], [place] 09/29/2019, 3:57 PM

## 2019-10-03 ENCOUNTER — Telehealth (HOSPITAL_COMMUNITY): Payer: Self-pay

## 2019-10-03 NOTE — Telephone Encounter (Signed)
Patient called to discuss post bariatric surgery follow up questions.  See below:   1.  Tell me about your pain and pain management?denies  2.  Let's talk about fluid intake.  How much total fluid are you taking in?64 ounces  3.  How much protein have you taken in the last 2 days?60  4.  Have you had nausea?  Tell me about when have experienced nausea and what you did to help?denies  5.  Has the frequency or color changed with your urine?light   6.  Tell me what your incisions look like?no problem  7.  Have you been passing gas? BM?had sat after medication  8.  If a problem or question were to arise who would you call?  Do you know contact numbers for [facility], [facility], and [facility]?aware of how to contact all services  9.  How has the walking going?walks freqently  10.  How are your vitamins and calcium going?  How are you taking them?mvi and calcium

## 2019-10-04 ENCOUNTER — Ambulatory Visit (HOSPITAL_COMMUNITY)
Admission: RE | Admit: 2019-10-04 | Discharge: 2019-10-04 | Disposition: A | Discharge status: Home / Self Care | Payer: BC Managed Care – PPO | Source: Ambulatory Visit | Attending: General Surgery | Admitting: General Surgery

## 2019-10-04 ENCOUNTER — Other Ambulatory Visit (HOSPITAL_COMMUNITY): Payer: Self-pay | Admitting: Surgery

## 2019-10-04 ENCOUNTER — Other Ambulatory Visit: Payer: Self-pay

## 2019-10-04 DIAGNOSIS — M7989 Other specified soft tissue disorders: Secondary | ICD-10-CM | POA: Diagnosis not present

## 2019-10-04 DIAGNOSIS — M79605 Pain in left leg: Secondary | ICD-10-CM | POA: Insufficient documentation

## 2019-10-04 NOTE — Progress Notes (Signed)
Left lower extremity venous duplex has been completed. Preliminary results can be found in [facility] Proc through chart review.  Results were given to [place] at Dr. Dois Davenport office.  10/04/19 1:35 PM Hannah Lynch RVT

## 2019-10-11 ENCOUNTER — Encounter: Payer: BC Managed Care – PPO | Attending: General Surgery | Admitting: Dietician

## 2019-10-11 ENCOUNTER — Other Ambulatory Visit: Payer: Self-pay

## 2019-10-11 ENCOUNTER — Encounter: Payer: Self-pay | Admitting: Dietician

## 2019-10-11 DIAGNOSIS — E669 Obesity, unspecified: Secondary | ICD-10-CM | POA: Diagnosis present

## 2019-10-11 NOTE — Progress Notes (Signed)
2 Week Post-Operative Nutrition Education Bariatric Nutrition Education  Start Time: 10:00am   End Time: 10:20am  Patient was seen on 10/11/2019 for post-operative nutrition education at [facility]   Surgery date: 09/27/2019 Surgery type: Sleeve  Start weight at [facility]: 231.3 lbs (date: 06/14/2019) Weight today: 209.9 lbs Weight change: -21 lbs (since previous visit 07/25/2019)  Body Composition Scale 10/11/2019  Weight (lbs) 209.9  BMI 36  Total Body Fat % -     Visceral Fat -  Fat-Free Mass % -     Total Body Water % -     Muscle-Mass (lbs) -  Body Fat Displacement ---         Torso  (lbs) -         Left Leg  (lbs) -         Right Leg  (lbs) -         Left Arm  (lbs) -         Right Arm  (lbs) -    Patient states she drinks ~100 oz of fluid daily. Fluids include protein shakes, water, Gatorade Zero, and broth. No issues or concerns reported other than heartburn, which is believed to be related to hernia repair that occurred at time of surgery. States this is getting better.    The following the learning objectives were met by the patient during this course:  Identifies Phase 3 (Soft, High Protein Foods) Dietary Goals and will begin from 2 weeks post-operatively to 2 months post-operatively  Identifies appropriate sources of fluids and proteins   States protein recommendations and appropriate sources post-operatively  Identifies the need for appropriate texture modifications, mastication, and bite sizes when consuming solids  Identifies appropriate multivitamin and calcium sources post-operatively  Describes the need for physical activity post-operatively and will follow MD recommendations  States when to call healthcare provider regarding medication questions or post-operative complications  Handouts given include:  Phase 3: Soft, High Protein Diet  Phase 3 Meal Ideas  Follow-Up Plan: Patient will follow-up at [facility] in 6 weeks for  2 month post-op nutrition visit for diet advancement per MD.

## 2019-10-12 ENCOUNTER — Ambulatory Visit (INDEPENDENT_AMBULATORY_CARE_PROVIDER_SITE_OTHER): Payer: BC Managed Care – PPO | Admitting: Orthopaedic Surgery

## 2019-10-12 ENCOUNTER — Encounter: Payer: Self-pay | Admitting: Orthopaedic Surgery

## 2019-10-12 VITALS — Ht 64.0 in | Wt 209.0 lb

## 2019-10-12 DIAGNOSIS — M25562 Pain in left knee: Secondary | ICD-10-CM | POA: Insufficient documentation

## 2019-10-12 DIAGNOSIS — G8929 Other chronic pain: Secondary | ICD-10-CM | POA: Diagnosis not present

## 2019-10-12 NOTE — Progress Notes (Signed)
Office Visit Note   Patient: Hannah Lynch           Date of Birth: [birth year]           MRN: [id number] Visit Date: 10/12/2019              Requested by: Eartha Inch, MD [address] [place],  [place] [address] PCP: Eartha Inch, MD   Assessment & Plan: Visit Diagnoses:  1. Chronic pain of left knee     Plan: Itzamar had another injury to her left knee recently prompting emergency room visit.  X-rays were negative.  She notes that she has been having trouble now for several weeks and is having difficulty bending stooping walking and sleeping.  Her pain is localized on the lateral aspect of her knee.  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  She was doing well until she had this injury several weeks ago.  Will order an MRI scan with a concern of a lateral meniscal tear  Follow-Up Instructions: Return After MRI left knee.   Orders:  Orders Placed This Encounter  Procedures  . MR Knee Left w/o contrast   No orders of the defined types were placed in this encounter.     Procedures: No procedures performed   Clinical Data: No additional findings.   Subjective: Chief Complaint  Patient presents with  . Left Knee - Pain    DOI 09/22/2019  Patient presents today with left knee pain. She fell on 09/22/2019 when her knee gave out. Her pain is located laterally, with swelling. Her patella feels unstable. She has been using a brace and ice, that seems to help some. She had x-rays taken at an urgent care on 09/22/2019.  These films were reviewed on the PACS system.  I did not see any acute change.  There may be slight decrease in the medial compartment  HPI  Review of Systems   Objective: Vital Signs: Ht 5\' 4"  (1.626 m)   Wt 209 lb (94.8 kg)   BMI 35.87 kg/m   Physical Exam  Ortho Exam awake alert and oriented x3.  Comfortable sitting.   Left knee exam was benign in terms of redness or erythema.  No effusion.  Diffuse lateral joint pain with some mild clicking.  No patella pain and no medial joint pain.  Does walk with a limp.  No instability.  Specialty Comments:  No specialty comments available.  Imaging: No results found.   PMFS History: Patient Active Problem List   Diagnosis Date Noted  . Pain in left knee 10/12/2019  . Obesity (BMI 30-39.9) 09/27/2019  . GERD (gastroesophageal reflux disease) 09/27/2019  . Essential hypertension 09/27/2019  . Fatty liver 09/27/2019  . Metabolic syndrome 09/27/2019  . Pain in right ankle and joints of right foot 04/13/2019  . Chronic cholecystitis   . Primary insomnia 06/25/2017  . Papanicolaou smear for cervical cancer screening 06/25/2017  . Bilateral hearing loss 06/25/2017  . Acute serous otitis media 06/25/2017  . Lumbar stenosis with neurogenic claudication 09/25/2014   Past Medical History:  Diagnosis Date  . Anxiety   . Degenerative disc disease, lumbar   . Degenerative disc disease, lumbar 2012   lumbar and cervical region  . Depression   . Family history of adverse reaction to anesthesia    pt's mother has hx. of  post-op N/V  . Headache   . Hypothyroidism   . IBS (irritable bowel syndrome)   . Nasal septal deviation 11/2017  . Nasal turbinate hypertrophy 11/2017  . PONV (postoperative nausea and vomiting)   . Snoring 11/2017    Family History  Problem Relation Age of Onset  . Hypertension Mother   . Diabetes Mother   . Heart failure Father   . Hypertension Father   . Diabetes Father   . Heart failure Other   . Breast cancer Paternal Grandmother     Past Surgical History:  Procedure Laterality Date  . ABDOMINAL HYSTERECTOMY  2005   partial  . ANTERIOR CERVICAL DECOMP/DISCECTOMY FUSION  03/25/2007   C4-5  . CERVICAL FUSION  2013  . CHOLECYSTECTOMY N/A 03/26/2018   Procedure: LAPAROSCOPIC CHOLECYSTECTOMY;  Surgeon: Aviva Signs, MD;  Location:  [facility];  Service: General;  Laterality: N/A;  . KNEE ARTHROSCOPY Right   . LAPAROSCOPIC GASTRIC SLEEVE RESECTION N/A 09/27/2019   Procedure: LAPAROSCOPIC GASTRIC SLEEVE RESECTION, HIATAL HERNIA REPARI ;Upper Endo, Eras Pathway;  Surgeon: Greer Pickerel, MD;  Location: [facility];  Service: General;  Laterality: N/A;  . LUMBAR LAMINECTOMY/DECOMPRESSION MICRODISCECTOMY  11/21/2004   L4-5  . MAXIMUM ACCESS (MAS)POSTERIOR LUMBAR INTERBODY FUSION (PLIF) 1 LEVEL N/A 09/25/2014   Procedure: Lumbar four-five MAXIMUM ACCESS (MAS) POSTERIOR LUMBAR INTERBODY FUSION (PLIF) 1 LEVEL;  Surgeon: Erline Levine, MD;  Location: [facility];  Service: Neurosurgery;  Laterality: N/A;  . NASAL SEPTOPLASTY W/ TURBINOPLASTY N/A 12/07/2017   Procedure: NASAL SEPTOPLASTY WITH BILATERAL TURBINATE REDUCTION;  Surgeon: Melida Quitter, MD;  Location: [facility];  Service: ENT;  Laterality: N/A;  . ORIF ANKLE FRACTURE Right   . TONSILLECTOMY  06/2018  . UPPER GI ENDOSCOPY     Social History   Occupational History  . Not on file  Tobacco Use  . Smoking status: Never Smoker  . Smokeless tobacco: Never Used  Substance and Sexual Activity  . Alcohol use: No    Frequency: Never  . Drug use: No  . Sexual activity: Yes    Birth control/protection: None

## 2019-10-19 ENCOUNTER — Ambulatory Visit: Payer: BC Managed Care – PPO | Admitting: Orthopaedic Surgery

## 2019-10-21 ENCOUNTER — Ambulatory Visit (HOSPITAL_COMMUNITY)
Admission: RE | Admit: 2019-10-21 | Discharge: 2019-10-21 | Disposition: A | Discharge status: Home / Self Care | Payer: BC Managed Care – PPO | Source: Ambulatory Visit | Attending: Orthopaedic Surgery | Admitting: Orthopaedic Surgery

## 2019-10-21 ENCOUNTER — Other Ambulatory Visit: Payer: Self-pay

## 2019-10-21 DIAGNOSIS — M25562 Pain in left knee: Secondary | ICD-10-CM | POA: Insufficient documentation

## 2019-10-21 DIAGNOSIS — G8929 Other chronic pain: Secondary | ICD-10-CM | POA: Insufficient documentation

## 2019-10-26 ENCOUNTER — Ambulatory Visit (INDEPENDENT_AMBULATORY_CARE_PROVIDER_SITE_OTHER): Payer: BC Managed Care – PPO | Admitting: Orthopaedic Surgery

## 2019-10-26 ENCOUNTER — Encounter: Payer: Self-pay | Admitting: Orthopaedic Surgery

## 2019-10-26 VITALS — Ht 64.0 in | Wt 209.0 lb

## 2019-10-26 DIAGNOSIS — G8929 Other chronic pain: Secondary | ICD-10-CM | POA: Diagnosis not present

## 2019-10-26 DIAGNOSIS — M25562 Pain in left knee: Secondary | ICD-10-CM

## 2019-10-26 NOTE — Progress Notes (Signed)
Office Visit Note   Patient: Hannah Lynch           Date of Birth: [birth year]           MRN: [id number] Visit Date: 10/26/2019              Requested by: Eartha Inch, MD [address] [place],  [place] [address] PCP: Eartha Inch, MD   Assessment & Plan: Visit Diagnoses:  1. Chronic pain of left knee     Plan: MRI scan demonstrates a large radial tear of the body of the lateral meniscus extending into the posterior horn.  ACL and PCL are intact.  There is partial thickness cartilage loss of the patellofemoral joint, medial lateral compartments with a small joint effusion.  Pain is localized to the lateral compartment after a fall over a month ago.  Have discussed the MRI scan findings and she like to proceed with arthroscopy.  Will schedule hopefully by the end of the year  Follow-Up Instructions: Return We will schedule left knee arthroscopy.   Orders:  No orders of the defined types were placed in this encounter.  No orders of the defined types were placed in this encounter.     Procedures: No procedures performed   Clinical Data: No additional findings.   Subjective: Chief Complaint  Patient presents with  . Left Knee - Follow-up    MRI results  Patient presents today for follow up on her left knee. She had an MRI on 10/21/2019 and is here to talk about her results and treatment plan.  HPI  Review of Systems   Objective: Vital Signs: Ht 5\' 4"  (1.626 m)   Wt 209 lb (94.8 kg)   BMI 35.87 kg/m   Physical Exam  Ortho Exam left knee without effusion.  No instability.  Does have local tenderness along the lateral compartment consistent with the MRI scan findings of a tear of the lateral meniscus.  Very minimal patellar crepitation but no pain. no pain medially.  No distal edema. Specialty Comments:  No specialty comments available.  Imaging: No results found.   PMFS History: Patient Active Problem List   Diagnosis Date Noted  . Pain in left  knee 10/12/2019  . Obesity (BMI 30-39.9) 09/27/2019  . GERD (gastroesophageal reflux disease) 09/27/2019  . Essential hypertension 09/27/2019  . Fatty liver 09/27/2019  . Metabolic syndrome 09/27/2019  . Pain in right ankle and joints of right foot 04/13/2019  . Chronic cholecystitis   . Primary insomnia 06/25/2017  . Papanicolaou smear for cervical cancer screening 06/25/2017  . Bilateral hearing loss 06/25/2017  . Acute serous otitis media 06/25/2017  . Lumbar stenosis with neurogenic claudication 09/25/2014   Past Medical History:  Diagnosis Date  . Anxiety   . Degenerative disc disease, lumbar   . Degenerative disc disease, lumbar 2012   lumbar and cervical region  . Depression   . Family history of adverse reaction to anesthesia    pt's mother has hx. of post-op N/V  . Headache   . Hypothyroidism   . IBS (irritable bowel syndrome)   . Nasal septal deviation 11/2017  . Nasal turbinate hypertrophy 11/2017  . PONV (postoperative nausea and vomiting)   . Snoring 11/2017    Family History  Problem Relation Age of Onset  . Hypertension Mother   . Diabetes Mother   . Heart failure Father   . Hypertension Father   . Diabetes Father   . Heart failure  Other   . Breast cancer Paternal Grandmother     Past Surgical History:  Procedure Laterality Date  . ABDOMINAL HYSTERECTOMY  2005   partial  . ANTERIOR CERVICAL DECOMP/DISCECTOMY FUSION  03/25/2007   C4-5  . CERVICAL FUSION  2013  . CHOLECYSTECTOMY N/A 03/26/2018   Procedure: LAPAROSCOPIC CHOLECYSTECTOMY;  Surgeon: Aviva Signs, MD;  Location: [facility];  Service: General;  Laterality: N/A;  . KNEE ARTHROSCOPY Right   . LAPAROSCOPIC GASTRIC SLEEVE RESECTION N/A 09/27/2019   Procedure: LAPAROSCOPIC GASTRIC SLEEVE RESECTION, HIATAL HERNIA REPARI ;Upper Endo, Eras Pathway;  Surgeon: Greer Pickerel, MD;  Location: [facility];  Service: General;  Laterality: N/A;  . LUMBAR LAMINECTOMY/DECOMPRESSION MICRODISCECTOMY  11/21/2004   L4-5    . MAXIMUM ACCESS (MAS)POSTERIOR LUMBAR INTERBODY FUSION (PLIF) 1 LEVEL N/A 09/25/2014   Procedure: Lumbar four-five MAXIMUM ACCESS (MAS) POSTERIOR LUMBAR INTERBODY FUSION (PLIF) 1 LEVEL;  Surgeon: Erline Levine, MD;  Location: [facility];  Service: Neurosurgery;  Laterality: N/A;  . NASAL SEPTOPLASTY W/ TURBINOPLASTY N/A 12/07/2017   Procedure: NASAL SEPTOPLASTY WITH BILATERAL TURBINATE REDUCTION;  Surgeon: Melida Quitter, MD;  Location: [facility];  Service: ENT;  Laterality: N/A;  . ORIF ANKLE FRACTURE Right   . TONSILLECTOMY  06/2018  . UPPER GI ENDOSCOPY     Social History   Occupational History  . Not on file  Tobacco Use  . Smoking status: Never Smoker  . Smokeless tobacco: Never Used  Substance and Sexual Activity  . Alcohol use: No  . Drug use: No  . Sexual activity: Yes    Birth control/protection: None

## 2019-11-10 ENCOUNTER — Telehealth: Payer: Self-pay

## 2019-11-10 ENCOUNTER — Other Ambulatory Visit: Payer: Self-pay | Admitting: Orthopedic Surgery

## 2019-11-10 ENCOUNTER — Other Ambulatory Visit: Payer: Self-pay | Admitting: Orthopaedic Surgery

## 2019-11-10 ENCOUNTER — Encounter: Payer: Self-pay | Admitting: Orthopaedic Surgery

## 2019-11-10 ENCOUNTER — Telehealth: Payer: Self-pay | Admitting: Orthopaedic Surgery

## 2019-11-10 DIAGNOSIS — M23252 Derangement of posterior horn of lateral meniscus due to old tear or injury, left knee: Secondary | ICD-10-CM

## 2019-11-10 DIAGNOSIS — M94262 Chondromalacia, left knee: Secondary | ICD-10-CM | POA: Diagnosis not present

## 2019-11-10 MED ORDER — OXYCODONE HCL 5 MG PO CAPS: 5.0000 mg | ORAL_CAPSULE | ORAL | 0 refills | Status: DC | PRN

## 2019-11-10 MED ORDER — OXYCODONE HCL 5 MG PO TABS: 5.0000 mg | ORAL_TABLET | ORAL | 0 refills | Status: DC | PRN

## 2019-11-10 MED ORDER — OXYCODONE-ACETAMINOPHEN 5-325 MG PO TABS: 1.0000 | ORAL_TABLET | ORAL | 0 refills | Status: DC | PRN

## 2019-11-10 NOTE — Telephone Encounter (Signed)
Sent via imprivata

## 2019-11-10 NOTE — Telephone Encounter (Signed)
Pt called back in regarding her prescription, the pharmacy called the pt and advised her that they are out of the oxycodone in capsules so they need the prescription to be changed to tablets.   [phone]

## 2019-11-10 NOTE — Telephone Encounter (Signed)
Please see below.

## 2019-11-10 NOTE — Telephone Encounter (Signed)
Shawn with [facility] called stating that they do not carry the capsules for Oxycodone 5mg  and if possible, could this Rx be sent in for Oxycodone 5mg  Tablets instead.  Cb# is [phone].  Please advise.  Thank you.

## 2019-11-10 NOTE — Telephone Encounter (Signed)
Ok for tablets

## 2019-11-10 NOTE — Telephone Encounter (Signed)
Called and [facility] for patient. I notified her that oxycodone has been sent in.

## 2019-11-10 NOTE — Telephone Encounter (Signed)
Please resubmit to pharmacy

## 2019-11-10 NOTE — Telephone Encounter (Signed)
Please call.

## 2019-11-10 NOTE — Telephone Encounter (Signed)
Sent in via imprivata

## 2019-11-10 NOTE — Telephone Encounter (Signed)
Pt called in said she had surgery with Dr.Whitfield today 11-10-2019 and the pharmacy called pt about her medication Oxycodone said they don't have that medication in stock at the moment, she needs [facility] [address] in [place].  [phone]

## 2019-11-15 DIAGNOSIS — M23207 Derangement of unspecified meniscus due to old tear or injury, left knee: Secondary | ICD-10-CM | POA: Insufficient documentation

## 2019-11-15 DIAGNOSIS — G43909 Migraine, unspecified, not intractable, without status migrainosus: Secondary | ICD-10-CM | POA: Insufficient documentation

## 2019-11-17 ENCOUNTER — Ambulatory Visit (INDEPENDENT_AMBULATORY_CARE_PROVIDER_SITE_OTHER): Payer: 59 | Admitting: Orthopaedic Surgery

## 2019-11-17 ENCOUNTER — Encounter: Payer: Self-pay | Admitting: Orthopaedic Surgery

## 2019-11-17 ENCOUNTER — Other Ambulatory Visit: Payer: Self-pay

## 2019-11-17 VITALS — Ht 64.0 in | Wt 209.0 lb

## 2019-11-17 DIAGNOSIS — G8929 Other chronic pain: Secondary | ICD-10-CM

## 2019-11-17 DIAGNOSIS — M25562 Pain in left knee: Secondary | ICD-10-CM

## 2019-11-17 NOTE — Progress Notes (Signed)
Office Visit Note   Patient: Hannah Lynch           Date of Birth: [birth year]           MRN: [id number] Visit Date: 11/17/2019              Requested by: Eartha Inch, MD [address] [place],  [place] [address] PCP: Eartha Inch, MD   Assessment & Plan: Visit Diagnoses:  1. Chronic pain of left knee     Plan: 1 week status post left knee arthroscopy with a partial lateral meniscectomy and debridement of chondromalacia.  Doing very well.  Not using any ambulatory aid.  No fever chills or calf pain.  Plan to see her back in a week in the [place] office and consider back to work at that time.  She is on her feet up to 6 hours a day at work and I do not think she is ready to do that yet  Follow-Up Instructions: Return in about 1 week (around 11/24/2019), or [place].   Orders:  No orders of the defined types were placed in this encounter.  No orders of the defined types were placed in this encounter.     Procedures: No procedures performed   Clinical Data: No additional findings.   Subjective: Chief Complaint  Patient presents with  . Left Knee - Follow-up    Left knee scope DOS 11/10/2019  Patient presents today for follow up on her left knee. She had a left knee arthroscopy on 11/10/2019, and is now one week out from surgery. Patient states that she is doing well. She said that it is sore, but not taking anything for pain.   HPI  Review of Systems  Constitutional: Negative for fatigue.  HENT: Negative for ear pain.   Eyes: Negative for pain.  Respiratory: Negative for shortness of breath.   Cardiovascular: Negative for leg swelling.  Gastrointestinal: Negative for constipation and diarrhea.  Endocrine: Negative for cold intolerance and heat intolerance.  Genitourinary: Negative for difficulty urinating.  Musculoskeletal: Negative for joint swelling.  Skin: Negative for rash.  Allergic/Immunologic: Negative for food allergies.  Neurological: Negative for  weakness.  Hematological: Does not bruise/bleed easily.  Psychiatric/Behavioral: Negative for sleep disturbance.     Objective: Vital Signs: Ht 5\' 4"  (1.626 m)   Wt 209 lb (94.8 kg)   BMI 35.87 kg/m   Physical Exam  Ortho Exam left knee arthroscopic portals healing without problem.  No effusion.  Full quick extension and over 100 degrees of flexion without instability.  No popliteal pain or mass.  No calf pain no distal edema.  Resolving ecchymosis around the arthroscopic portals but no drainage  Specialty Comments:  No specialty comments available.  Imaging: No results found.   PMFS History: Patient Active Problem List   Diagnosis Date Noted  . Pain in left knee 10/12/2019  . Obesity (BMI 30-39.9) 09/27/2019  . GERD (gastroesophageal reflux disease) 09/27/2019  . Essential hypertension 09/27/2019  . Fatty liver 09/27/2019  . Metabolic syndrome 09/27/2019  . Pain in right ankle and joints of right foot 04/13/2019  . Chronic cholecystitis   . Primary insomnia 06/25/2017  . Papanicolaou smear for cervical cancer screening 06/25/2017  . Bilateral hearing loss 06/25/2017  . Acute serous otitis media 06/25/2017  . Lumbar stenosis with neurogenic claudication 09/25/2014   Past Medical History:  Diagnosis Date  . Anxiety   . Degenerative disc disease, lumbar   . Degenerative disc  disease, lumbar 2012   lumbar and cervical region  . Depression   . Family history of adverse reaction to anesthesia    pt's mother has hx. of post-op N/V  . Headache   . Hypothyroidism   . IBS (irritable bowel syndrome)   . Nasal septal deviation 11/2017  . Nasal turbinate hypertrophy 11/2017  . PONV (postoperative nausea and vomiting)   . Snoring 11/2017    Family History  Problem Relation Age of Onset  . Hypertension Mother   . Diabetes Mother   . Heart failure Father   . Hypertension Father   . Diabetes Father   . Heart failure Other   . Breast cancer Paternal Grandmother       Past Surgical History:  Procedure Laterality Date  . ABDOMINAL HYSTERECTOMY  2005   partial  . ANTERIOR CERVICAL DECOMP/DISCECTOMY FUSION  03/25/2007   C4-5  . CERVICAL FUSION  2013  . CHOLECYSTECTOMY N/A 03/26/2018   Procedure: LAPAROSCOPIC CHOLECYSTECTOMY;  Surgeon: Aviva Signs, MD;  Location: [facility];  Service: General;  Laterality: N/A;  . KNEE ARTHROSCOPY Right   . LAPAROSCOPIC GASTRIC SLEEVE RESECTION N/A 09/27/2019   Procedure: LAPAROSCOPIC GASTRIC SLEEVE RESECTION, HIATAL HERNIA REPARI ;Upper Endo, Eras Pathway;  Surgeon: Greer Pickerel, MD;  Location[facility];  Service: General;  Laterality: N/A;  . LUMBAR LAMINECTOMY/DECOMPRESSION MICRODISCECTOMY  11/21/2004   L4-5  . MAXIMUM ACCESS (MAS)POSTERIOR LUMBAR INTERBODY FUSION (PLIF) 1 LEVEL N/A 09/25/2014   Procedure: Lumbar four-five MAXIMUM ACCESS (MAS) POSTERIOR LUMBAR INTERBODY FUSION (PLIF) 1 LEVEL;  Surgeon: Erline Levine, MD;  Location: [facility];  Service: Neurosurgery;  Laterality: N/A;  . NASAL SEPTOPLASTY W/ TURBINOPLASTY N/A 12/07/2017   Procedure: NASAL SEPTOPLASTY WITH BILATERAL TURBINATE REDUCTION;  Surgeon: Melida Quitter, MD;  Location: [facility];  Service: ENT;  Laterality: N/A;  . ORIF ANKLE FRACTURE Right   . TONSILLECTOMY  06/2018  . UPPER GI ENDOSCOPY     Social History   Occupational History  . Not on file  Tobacco Use  . Smoking status: Never Smoker  . Smokeless tobacco: Never Used  Substance and Sexual Activity  . Alcohol use: No  . Drug use: No  . Sexual activity: Yes    Birth control/protection: None

## 2019-11-22 ENCOUNTER — Encounter: Payer: 59 | Attending: General Surgery | Admitting: Dietician

## 2019-11-22 ENCOUNTER — Other Ambulatory Visit: Payer: Self-pay

## 2019-11-22 ENCOUNTER — Encounter: Payer: Self-pay | Admitting: Dietician

## 2019-11-22 DIAGNOSIS — E669 Obesity, unspecified: Secondary | ICD-10-CM | POA: Diagnosis not present

## 2019-11-22 NOTE — Progress Notes (Signed)
Bariatric Nutrition Follow-Up Visit Medical Nutrition Therapy  Appt Start Time: 8:20am   End Time: 8:50am  2 Months Post-Operative Sleeve Surgery Surgery Date: 10/07/2019  Pt's Expectations of Surgery/ Goals: to be healthier; victories thus far include coming off of blood pressure medication, good thyroid panel, and having more energy   NUTRITION ASSESSMENT  Anthropometrics  Start weight at [facility]: 231.3 lbs (date: 06/14/2019) Today's weight: 197.8 lbs  Body Composition Scale 10/11/2019 11/22/2019  Weight  lbs 209.9 197.8  BMI 36 33.8  Total Body Fat  % - 39.2     Visceral Fat - 11  Fat-Free Mass  % - 60.7     Total Body Water  % - 44.8     Muscle-Mass  lbs - 31  Body Fat Displacement --- ---         Torso  lbs - 48         Left Leg  lbs - 9.6         Right Leg  lbs - 9.6         Left Arm  lbs - 4.8         Right Arm  lbs - 4.8   Lifestyle & Dietary Hx Patient states she is tolerating foods/fluids well. Exceeding daily fluid goal and is well hydrated. Meeting daily protein goal and eating only protein foods/beverages. Sticks to 2-3 ounce portions of protein foods. Protein sources include primarily chicken and shrimp. No longer experiencing heartburn, and no other issues reported.    24-Hr Dietary Recall First Meal: protein shake  Snack: -  Second Meal: 2-3 oz protein   Snack: -  Third Meal: 2-3 oz protein  Snack: -  Beverages: water, protein shakes, sugar-free flavored water, milk   Estimated daily fluid intake: 80+ oz Estimated daily protein intake: 60 g Supplements: [employer] bariatric MVI, calcium  Current average weekly physical activity: walking, just ordered recumbent bike    Post-Op Goals/ Signs/ Symptoms Using straws: no Drinking while eating: no Chewing/swallowing difficulties: no Changes in vision: no Changes to mood/headaches: no Hair loss/changes to skin/nails: no Difficulty focusing/concentrating: no Sweating: no Dizziness/lightheadedness:  no Palpitations: no  Carbonated/caffeinated beverages: no N/V/D/C/Gas: no Abdominal pain: no Dumping syndrome: no   NUTRITION DIAGNOSIS  Overweight/obesity ([place]-3.3) related to past poor dietary habits and physical inactivity as evidenced by completed bariatric surgery and following dietary guidelines for continued weight loss and healthy nutrition status.   NUTRITION INTERVENTION Nutrition counseling (C-1) and education (E-2) to facilitate bariatric surgery goals, including: . Diet advancement to the next phase (phase 4) now including non-starchy vegetables  . The importance of consuming adequate calories as well as certain nutrients daily due to the body's need for essential vitamins, minerals, and fats . The importance of daily physical activity and to reach a goal of at least 150 minutes of moderate to vigorous physical activity weekly (or as directed by their physician) due to benefits such as increased musculature and improved lab values  Handouts Provided Include   Phase 4: Protein + Non-Starchy Vegetables   Learning Style & Readiness for Change Teaching method utilized: Visual & Auditory  Demonstrated degree of understanding via: Teach Back  Barriers to learning/adherence to lifestyle change: None Identified    MONITORING & EVALUATION Dietary intake, weekly physical activity, body weight, and goals in 4 months.  Next Steps Patient is to follow-up in 4 months for 6 month post-op follow-up.

## 2019-11-22 NOTE — Patient Instructions (Signed)

## 2019-11-23 ENCOUNTER — Ambulatory Visit (INDEPENDENT_AMBULATORY_CARE_PROVIDER_SITE_OTHER): Payer: 59 | Admitting: Orthopaedic Surgery

## 2019-11-23 ENCOUNTER — Encounter: Payer: Self-pay | Admitting: Orthopaedic Surgery

## 2019-11-23 VITALS — Ht 64.0 in | Wt 197.0 lb

## 2019-11-23 DIAGNOSIS — G8929 Other chronic pain: Secondary | ICD-10-CM

## 2019-11-23 DIAGNOSIS — M25562 Pain in left knee: Secondary | ICD-10-CM

## 2019-11-23 NOTE — Progress Notes (Signed)
Office Visit Note   Patient: Hannah Lynch           Date of Birth: 1971/04/11           MRN: [id number] Visit Date: 11/23/2019              Requested by: Eartha Inch, MD [address] [place],  [place] [address] PCP: Eartha Inch, MD   Assessment & Plan: Visit Diagnoses:  1. Chronic pain of left knee     Plan: 2-week status post left knee arthroscopy with partial lateral meniscectomy.  Doing extremely well without any symptoms and very happy with the results.  Okay to return to work.  We will plan to see her back as needed.  I do expect that she will have some trouble on a chronic basis over time but presently doing very well  Follow-Up Instructions: Return if symptoms worsen or fail to improve.   Orders:  No orders of the defined types were placed in this encounter.  No orders of the defined types were placed in this encounter.     Procedures: No procedures performed   Clinical Data: No additional findings.   Subjective: Chief Complaint  Patient presents with  . Left Knee - Follow-up    Left knee scope DOS 11/10/2019  Patient presents today for follow up on her left knee. She had a left knee arthroscopy on 11/10/2019. Patient states that she is doing well. She has no complaints except that the pain in her knee occasionally wakes her at night. Not taking anything for pain.  HPI  Review of Systems   Objective: Vital Signs: Ht 5\' 4"  (1.626 m)   Wt 197 lb (89.4 kg)   BMI 33.81 kg/m   Physical Exam  Ortho Exam left knee was not hot red warm and swollen.  No limp.  Arthroscopic portals healing without problem.  No calf pain.  No popliteal pain.  No distal edema.  Painless range of motion of the knee with full extension and flexion comparable to right knee.  No lateral joint pain  Specialty Comments:  No specialty comments available.  Imaging: No results found.   PMFS History: Patient Active Problem List   Diagnosis Date Noted  . Pain in left  knee 10/12/2019  . Obesity (BMI 30-39.9) 09/27/2019  . GERD (gastroesophageal reflux disease) 09/27/2019  . Essential hypertension 09/27/2019  . Fatty liver 09/27/2019  . Metabolic syndrome 09/27/2019  . Pain in right ankle and joints of right foot 04/13/2019  . Chronic cholecystitis   . Primary insomnia 06/25/2017  . Papanicolaou smear for cervical cancer screening 06/25/2017  . Bilateral hearing loss 06/25/2017  . Acute serous otitis media 06/25/2017  . Lumbar stenosis with neurogenic claudication 09/25/2014   Past Medical History:  Diagnosis Date  . Anxiety   . Degenerative disc disease, lumbar   . Degenerative disc disease, lumbar 2012   lumbar and cervical region  . Depression   . Family history of adverse reaction to anesthesia    pt's mother has hx. of post-op N/V  . Headache   . Hypothyroidism   . IBS (irritable bowel syndrome)   . Nasal septal deviation 11/2017  . Nasal turbinate hypertrophy 11/2017  . PONV (postoperative nausea and vomiting)   . Snoring 11/2017    Family History  Problem Relation Age of Onset  . Hypertension Mother   . Diabetes Mother   . Heart failure Father   . Hypertension Father   .  Diabetes Father   . Heart failure Other   . Breast cancer Paternal Grandmother     Past Surgical History:  Procedure Laterality Date  . ABDOMINAL HYSTERECTOMY  2005   partial  . ANTERIOR CERVICAL DECOMP/DISCECTOMY FUSION  03/25/2007   C4-5  . CERVICAL FUSION  2013  . CHOLECYSTECTOMY N/A 03/26/2018   Procedure: LAPAROSCOPIC CHOLECYSTECTOMY;  Surgeon: Aviva Signs, MD;  Location: [facility];  Service: General;  Laterality: N/A;  . KNEE ARTHROSCOPY Right   . LAPAROSCOPIC GASTRIC SLEEVE RESECTION N/A 09/27/2019   Procedure: LAPAROSCOPIC GASTRIC SLEEVE RESECTION, HIATAL HERNIA REPARI ;Upper Endo, Eras Pathway;  Surgeon: Greer Pickerel, MD;  Location: [facility];  Service: General;  Laterality: N/A;  . LUMBAR LAMINECTOMY/DECOMPRESSION MICRODISCECTOMY  11/21/2004   L4-5    . MAXIMUM ACCESS (MAS)POSTERIOR LUMBAR INTERBODY FUSION (PLIF) 1 LEVEL N/A 09/25/2014   Procedure: Lumbar four-five MAXIMUM ACCESS (MAS) POSTERIOR LUMBAR INTERBODY FUSION (PLIF) 1 LEVEL;  Surgeon: Erline Levine, MD;  Location: [facility];  Service: Neurosurgery;  Laterality: N/A;  . NASAL SEPTOPLASTY W/ TURBINOPLASTY N/A 12/07/2017   Procedure: NASAL SEPTOPLASTY WITH BILATERAL TURBINATE REDUCTION;  Surgeon: Melida Quitter, MD;  Location: [facility];  Service: ENT;  Laterality: N/A;  . ORIF ANKLE FRACTURE Right   . TONSILLECTOMY  06/2018  . UPPER GI ENDOSCOPY     Social History   Occupational History  . Not on file  Tobacco Use  . Smoking status: Never Smoker  . Smokeless tobacco: Never Used  Substance and Sexual Activity  . Alcohol use: No  . Drug use: No  . Sexual activity: Yes    Birth control/protection: None

## 2019-12-16 ENCOUNTER — Other Ambulatory Visit: Payer: Self-pay | Admitting: Family Medicine

## 2019-12-16 DIAGNOSIS — Z1231 Encounter for screening mammogram for malignant neoplasm of breast: Secondary | ICD-10-CM

## 2020-01-25 ENCOUNTER — Ambulatory Visit
Admission: RE | Admit: 2020-01-25 | Discharge: 2020-01-25 | Disposition: A | Discharge status: Home / Self Care | Payer: 59 | Source: Ambulatory Visit | Attending: Family Medicine | Admitting: Family Medicine

## 2020-01-25 ENCOUNTER — Other Ambulatory Visit: Payer: Self-pay

## 2020-01-25 DIAGNOSIS — Z1231 Encounter for screening mammogram for malignant neoplasm of breast: Secondary | ICD-10-CM

## 2020-04-17 ENCOUNTER — Encounter: Payer: 59 | Attending: General Surgery | Admitting: Skilled Nursing Facility1

## 2020-04-17 ENCOUNTER — Other Ambulatory Visit: Payer: Self-pay

## 2020-04-17 DIAGNOSIS — E669 Obesity, unspecified: Secondary | ICD-10-CM | POA: Insufficient documentation

## 2020-04-18 NOTE — Progress Notes (Signed)
Follow-up visit:  Post-Operative sleeve Surgery  Medical Nutrition Therapy:  Appt start time: 6:00pm end time:  7:00pm  Primary concerns today: Post-operative Bariatric Surgery Nutrition Management 6 Month Post-Op Class  Surgery date: 09/27/19 Surgery type: sleeve Start weight at [facility]: 231.3 Weight today: 192.4   Body Composition Scale Declined  Weight  lbs   Total Body Fat  %      Visceral Fat   Fat-Free Mass  %      Total Body Water  %      Muscle-Mass  lbs   BMI   Body Fat Displacement ---        Torso  lbs         Left Leg  lbs         Right Leg  lbs         Left Arm  lbs         Right Arm  lbs      Information Reviewed/ Discussed During Appointment: -Review of composition scale numbers -Fluid requirements (64-100 ounces) -Protein requirements (60-80g) -Strategies for tolerating diet -Advancement of diet to include Starchy vegetables -Barriers to inclusion of new foods -Inclusion of appropriate multivitamin and calcium supplements  -Exercise recommendations   Fluid intake: adequate   Medications: See List Supplementation: appropriate   Using straws: no Drinking while eating: no Having you been chewing well: yes Chewing/swallowing difficulties: no Changes in vision: no Changes to mood/headaches: no Hair loss/Cahnges to skin/Changes to nails: no Any difficulty focusing or concentrating: no Sweating: no Dizziness/Lightheaded: no Palpitations: no  Carbonated beverages: no N/V/D/C/GAS: no Abdominal Pain: no Dumping syndrome: no  Recent physical activity:  ADL's  Progress Towards Goal(s):  In Progress  Handouts given during visit include:  Phase V diet Progression   Goals Sheet  The Benefits of Exercise are endless.....  Support Group Topics  Pt Chosen Goals: Nutrition Goals:  I will drink 64 ounces of any sugar free, carbonation free option 7 days a week by (specific date) _09_______  Mental Health and Well Being: I will say 2 nice things  to and about myself 7 days a week by (specific date) _____09_________   Teaching Method Utilized:  Visual Auditory Hands on  Demonstrated degree of understanding via:  Teach Back   Monitoring/Evaluation:  Dietary intake, exercise, and body weight. Follow up in 3 months for 9 month post-op visit.

## 2020-04-27 ENCOUNTER — Other Ambulatory Visit: Payer: Self-pay

## 2020-04-27 ENCOUNTER — Ambulatory Visit
Admission: EM | Admit: 2020-04-27 | Discharge: 2020-04-27 | Disposition: A | Discharge status: Home / Self Care | Payer: 59 | Attending: Emergency Medicine | Admitting: Emergency Medicine

## 2020-04-27 ENCOUNTER — Encounter: Payer: Self-pay | Admitting: Emergency Medicine

## 2020-04-27 ENCOUNTER — Ambulatory Visit (INDEPENDENT_AMBULATORY_CARE_PROVIDER_SITE_OTHER): Payer: 59

## 2020-04-27 DIAGNOSIS — M79671 Pain in right foot: Secondary | ICD-10-CM

## 2020-04-27 NOTE — ED Provider Notes (Addendum)
[facility]    CSN: [id number] Arrival date & time: 04/27/20  1715      History   Chief Complaint Chief Complaint  Patient presents with  . Foot Pain    HPI Hannah Lynch is a 49 y.o. female.   Who presents to the urgent care with a complaint of right foot pain for the past 2 days.  Denies any precipitating event.  Localized pain to the right foot under the great toe.  Described the pain as constant achy.  Has tried OTC medication without relief.  Symptoms are made worse with range of motion.  Denies any symptoms in the past.  Denies chills, fever, nausea, vomiting, diarrhea.     The history is provided by the patient. No language interpreter was used.  Foot Pain    Past Medical History:  Diagnosis Date  . Anxiety   . Degenerative disc disease, lumbar   . Degenerative disc disease, lumbar 2012   lumbar and cervical region  . Depression   . Family history of adverse reaction to anesthesia    pt's mother has hx. of post-op N/V  . Headache   . Hypothyroidism   . IBS (irritable bowel syndrome)   . Nasal septal deviation 11/2017  . Nasal turbinate hypertrophy 11/2017  . PONV (postoperative nausea and vomiting)   . Snoring 11/2017    Patient Active Problem List   Diagnosis Date Noted  . Pain in left knee 10/12/2019  . Obesity (BMI 30-39.9) 09/27/2019  . GERD (gastroesophageal reflux disease) 09/27/2019  . Essential hypertension 09/27/2019  . Fatty liver 09/27/2019  . Metabolic syndrome 83/66/2947  . Pain in right ankle and joints of right foot 04/13/2019  . Chronic cholecystitis   . Primary insomnia 06/25/2017  . Papanicolaou smear for cervical cancer screening 06/25/2017  . Bilateral hearing loss 06/25/2017  . Acute serous otitis media 06/25/2017  . Lumbar stenosis with neurogenic claudication 09/25/2014    Past Surgical History:  Procedure Laterality Date  . ABDOMINAL HYSTERECTOMY  2005   partial  . ANTERIOR CERVICAL DECOMP/DISCECTOMY FUSION   03/25/2007   C4-5  . CERVICAL FUSION  2013  . CHOLECYSTECTOMY N/A 03/26/2018   Procedure: LAPAROSCOPIC CHOLECYSTECTOMY;  Surgeon: Aviva Signs, MD;  Location: [facility];  Service: General;  Laterality: N/A;  . KNEE ARTHROSCOPY Right   . LAPAROSCOPIC GASTRIC SLEEVE RESECTION N/A 09/27/2019   Procedure: LAPAROSCOPIC GASTRIC SLEEVE RESECTION, HIATAL HERNIA REPARI ;Upper Endo, Eras Pathway;  Surgeon: Greer Pickerel, MD;  Location: [facility];  Service: General;  Laterality: N/A;  . LUMBAR LAMINECTOMY/DECOMPRESSION MICRODISCECTOMY  11/21/2004   L4-5  . MAXIMUM ACCESS (MAS)POSTERIOR LUMBAR INTERBODY FUSION (PLIF) 1 LEVEL N/A 09/25/2014   Procedure: Lumbar four-five MAXIMUM ACCESS (MAS) POSTERIOR LUMBAR INTERBODY FUSION (PLIF) 1 LEVEL;  Surgeon: Erline Levine, MD;  Location: [facility];  Service: Neurosurgery;  Laterality: N/A;  . NASAL SEPTOPLASTY W/ TURBINOPLASTY N/A 12/07/2017   Procedure: NASAL SEPTOPLASTY WITH BILATERAL TURBINATE REDUCTION;  Surgeon: Melida Quitter, MD;  Location: [facility];  Service: ENT;  Laterality: N/A;  . ORIF ANKLE FRACTURE Right   . TONSILLECTOMY  06/2018  . UPPER GI ENDOSCOPY      OB History   No obstetric history on file.      Home Medications    Prior to Admission medications   Medication Sig Start Date End Date Taking? Authorizing Provider  amitriptyline (ELAVIL) 50 MG tablet Take 50 mg by mouth at bedtime.    [provider]  cetirizine (ZYRTEC) 10 MG chewable tablet Chew 1 tablet (10 mg total) by mouth daily. 05/13/19   Wurst, [place], PA-C  fluticasone (FLONASE) 50 MCG/ACT nasal spray Place 2 sprays into both nostrils daily. 05/13/19   Wurst, [place], PA-C  gabapentin (NEURONTIN) 100 MG capsule Take 2 capsules (200 mg total) by mouth every 12 (twelve) hours. 09/28/19   Gaynelle Adu, MD  levothyroxine (SYNTHROID, LEVOTHROID) 25 MCG tablet Take 25 mcg by mouth daily before breakfast.  01/23/17 09/15/20  [provider]  lisinopril  (ZESTRIL) 20 MG tablet Take 20 mg by mouth daily. 07/05/19   [provider]  montelukast (SINGULAIR) 10 MG tablet Take 10 mg by mouth daily. 09/06/19   [provider]  omeprazole (PRILOSEC) 20 MG capsule Take 20 mg by mouth daily. 09/08/18   [provider]  ondansetron (ZOFRAN-ODT) 4 MG disintegrating tablet Take 1 tablet (4 mg total) by mouth every 6 (six) hours as needed for nausea or vomiting. 09/28/19   Gaynelle Adu, MD  oxycodone (OXY-IR) 5 MG capsule Take 1-2 capsules (5-10 mg total) by mouth every 4 (four) hours as needed. 11/10/19   Valeria Batman, MD  oxyCODONE (ROXICODONE) 5 MG immediate release tablet Take 1 tablet (5 mg total) by mouth every 4 (four) hours as needed for moderate pain or severe pain. 11/10/19   Jetty Peeks, PA-C  oxyCODONE-acetaminophen (PERCOCET/ROXICET) 5-325 MG tablet Take 1-2 tablets by mouth every 4 (four) hours as needed for severe pain. 11/10/19   Valeria Batman, MD  SUMAtriptan (IMITREX) 50 MG tablet Take 50 mg by mouth every 2 (two) hours as needed for migraine.  10/14/17   [provider]  traMADol (ULTRAM) 50 MG tablet Take 1 tablet (50 mg total) by mouth every 6 (six) hours as needed (pain). 09/28/19   Gaynelle Adu, MD    Family History Family History  Problem Relation Age of Onset  . Hypertension Mother   . Diabetes Mother   . Heart failure Father   . Hypertension Father   . Diabetes Father   . Heart failure Other   . Breast cancer Paternal Grandmother     Social History Social History   Tobacco Use  . Smoking status: Never Smoker  . Smokeless tobacco: Never Used  Vaping Use  . Vaping Use: Never used  Substance Use Topics  . Alcohol use: No  . Drug use: No     Allergies   Patient has no known allergies.   Review of Systems Review of Systems  Constitutional: Negative.   Respiratory: Negative.   Cardiovascular: Negative.   Musculoskeletal: Positive for arthralgias.  All other  systems reviewed and are negative.    Physical Exam Triage Vital Signs ED Triage Vitals  Enc Vitals Group     BP 04/27/20 1730 123/89     Pulse Rate 04/27/20 1730 (!) 106     Resp 04/27/20 1730 17     Temp 04/27/20 1730 98 F (36.7 C)     Temp Source 04/27/20 1730 Oral     SpO2 04/27/20 1730 97 %     Weight 04/27/20 1728 188 lb (85.3 kg)     Height 04/27/20 1728 5\' 4"  (1.626 m)     Head Circumference --      Peak Flow --      Pain Score 04/27/20 1728 4     Pain Loc --      Pain Edu? --      Excl. in  GC? --    No data found.  Updated Vital Signs BP 123/89 (BP Location: Right Arm)   Pulse (!) 106   Temp 98 F (36.7 C) (Oral)   Resp 17   Ht 5\' 4"  (1.626 m)   Wt 188 lb (85.3 kg)   SpO2 97%   BMI 32.27 kg/m   Visual Acuity Right Eye Distance:   Left Eye Distance:   Bilateral Distance:    Right Eye Near:   Left Eye Near:    Bilateral Near:     Physical Exam Vitals and nursing note reviewed.  Constitutional:      General: She is not in acute distress.    Appearance: Normal appearance. She is normal weight. She is not ill-appearing, toxic-appearing or diaphoretic.  Cardiovascular:     Rate and Rhythm: Normal rate and regular rhythm.     Pulses: Normal pulses.     Heart sounds: Normal heart sounds. No murmur heard.  No friction rub. No gallop.   Pulmonary:     Effort: Pulmonary effort is normal. No respiratory distress.     Breath sounds: Normal breath sounds. No stridor. No wheezing, rhonchi or rales.  Chest:     Chest wall: No tenderness.  Musculoskeletal:        General: Tenderness present.     Right foot: Tenderness present.     Left foot: Normal.     Comments: Patient is able to ambulate and bear weight with pain.  The right foot is without any obvious asymmetry or deformity when compared to the left foot.  There is no surface trauma, ecchymosis, warmth, or lesion present.  Able to flex/extend, invert/evert with pain.  Neurovascular status intact.    Neurological:     Mental Status: She is alert.      UC Treatments / Results  Labs (all labs ordered are listed, but only abnormal results are displayed) Labs Reviewed - No data to display  EKG   Radiology DG Foot Complete Right  Result Date: 04/27/2020 CLINICAL DATA:  Foot pain EXAM: RIGHT FOOT COMPLETE - 3+ VIEW COMPARISON:  None. FINDINGS: No fracture or malalignment. Mild degenerative change at the first MTP joint. Incompletely visualized surgical hardware in the distal tibia and fibula. Moderate plantar calcaneal spur IMPRESSION: 1. No acute osseous abnormality 2. 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   On: 04/27/2020 18:32    Procedures Procedures (including critical care time)  Medications Ordered in UC Medications - No data to display  Initial Impression / Assessment and Plan / UC Course  I have reviewed the triage vital signs and the nursing notes.  Pertinent labs & imaging results that were available during my care of the patient were reviewed by me and considered in my medical decision making (see chart for details).   Patient is stable at discharge.  Right foot x-ray is negative for bony abnormality including fracture or dislocation.  I have reviewed the x-ray myself and the radiologist interpretation.  I am in agreement with the radiologist interpretation.  Advised to use OTC Tylenol/ibuprofen as needed for pain.  To follow-up with PCP.  Final Clinical Impressions(s) / UC Diagnoses   Final diagnoses:  Right foot pain     Discharge Instructions     May use OTC Tylenol/ibuprofen as needed for pain Follow-up with PCP Return or go to ED for worsening symptoms    ED Prescriptions    None  PDMP not reviewed this encounter.   Note: This document was prepared using Dragon voice recognition software and may include unintentional dictation errors.     Durward Parcel, FNP 04/27/20 [phone]

## 2020-04-27 NOTE — ED Triage Notes (Signed)
Pain and a knot to the side of RT foot under great toe x 2 days ago. Denies any injury

## 2020-04-27 NOTE — Discharge Instructions (Addendum)
May use OTC Tylenol/ibuprofen as needed for pain Follow-up with PCP Return or go to ED for worsening symptoms

## 2020-07-15 IMAGING — DX DG CHEST 2V
2 series · 2 of 2 positions shown · non-contrast
Comparison: June 30, 2017

CLINICAL DATA: Chest pain and shortness of breath.  Cough.

EXAM:
CHEST - 2 VIEW

[chest pa]
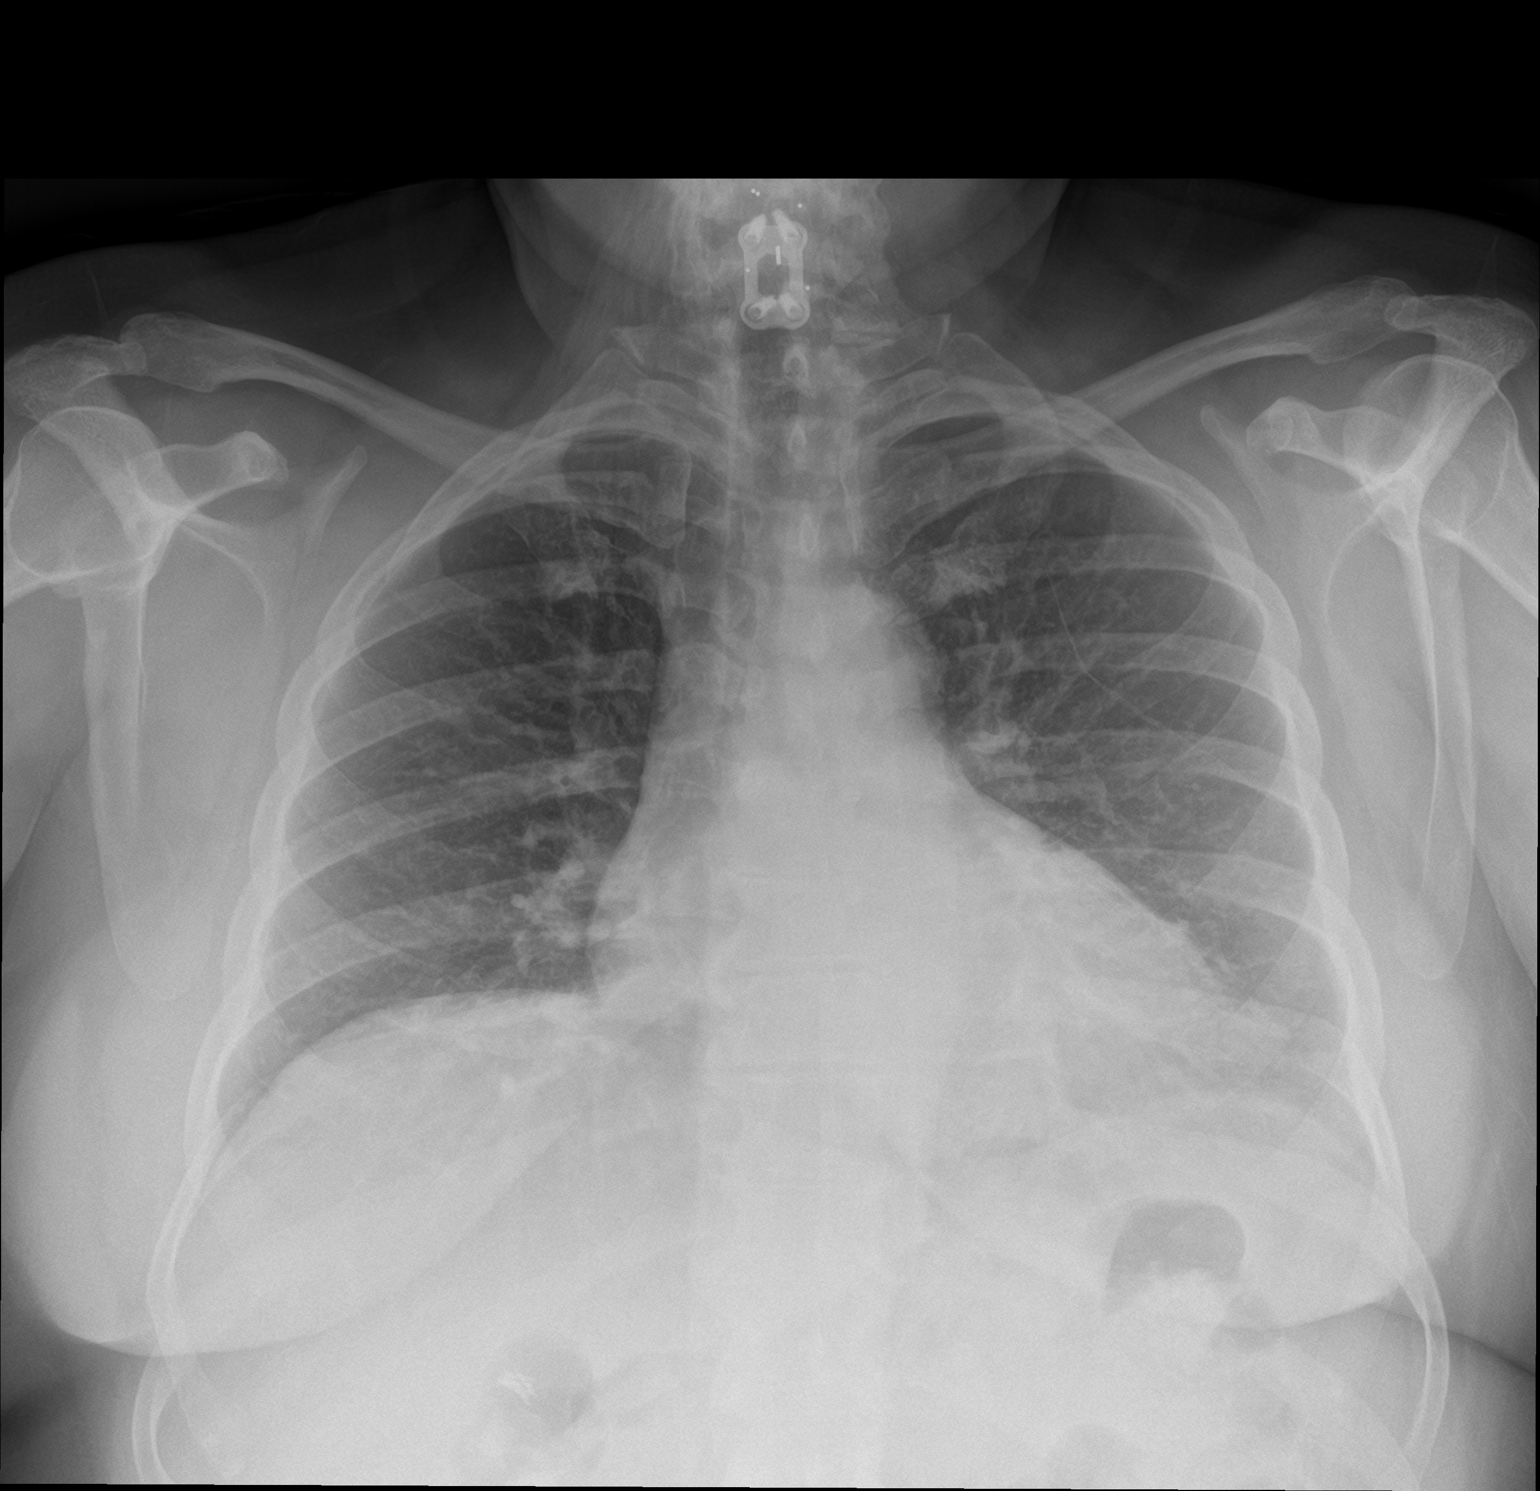

[chest lat]
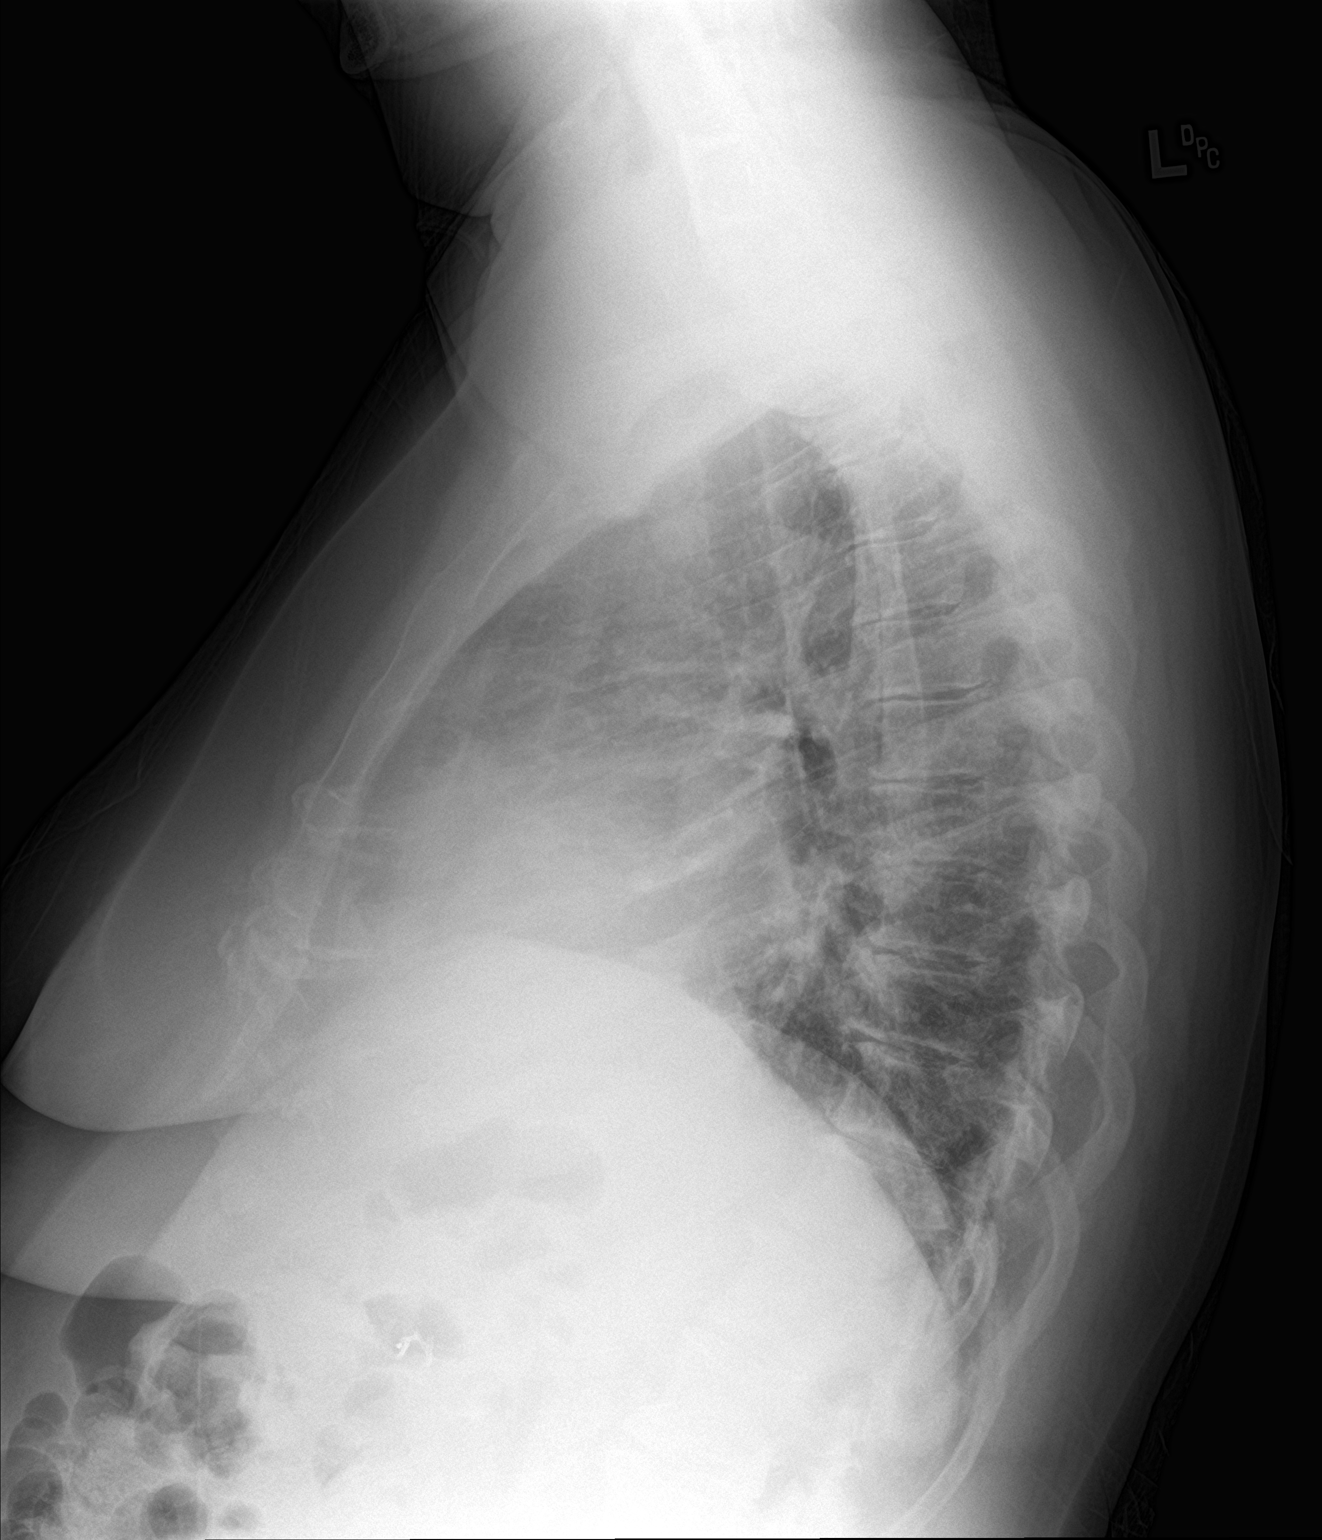

[2 of 2 positions shown; findings below may reference images not displayed]

FINDINGS: There is slight atelectasis in the left base. The lungs elsewhere
are clear. Heart size is upper normal with pulmonary vascularity
normal. No adenopathy. There is postoperative change in the lower
cervical region. There is degenerative change in the thoracic spine.
No pneumothorax.
IMPRESSION: Slight left base atelectasis. No edema or consolidation. Stable
cardiac silhouette.

## 2020-07-18 ENCOUNTER — Other Ambulatory Visit: Payer: Self-pay

## 2020-07-18 ENCOUNTER — Encounter: Payer: 59 | Attending: General Surgery | Admitting: Skilled Nursing Facility1

## 2020-07-18 DIAGNOSIS — E669 Obesity, unspecified: Secondary | ICD-10-CM | POA: Insufficient documentation

## 2020-07-18 NOTE — Progress Notes (Signed)
Follow-up visit:  Post-Operative sleeve Surgery   Primary concerns today: Post-operative Bariatric Surgery Nutrition Management   Surgery date: 09/27/19 Surgery type: sleeve Start weight at [facility]: 231.3 Weight today: 192.4   Pt states she was put back on blood pressure medicine and takes her blood pressure every other day stating she does see her cardiologist coming up soon. Pt states she really does not eat non starchy vegetables but is wiling to try new ones. Pt states since having surgery she avoids adding butter to her foods.   24 hr recall:  Breakfast: egg + grits  Snack: wheat thins Lunch: canned soup  Snack: fruit Dinner: mashed potatoes, baked chicken or pork chop  Beverages: 2 % milk, 62-68oz water, diet juice   Medications: See List Supplementation: multi and calcium  Physical activity: walking and exercising bike: 5 days a week  Using straws: no Drinking while eating: no Having you been chewing well: yes Chewing/swallowing difficulties: no Changes in vision: no Changes to mood/headaches: yes: once a week Hair loss/Cahnges to skin/Changes to nails: no Any difficulty focusing or concentrating: no Sweating: no Dizziness/Lightheaded: some (stating they are vertigo) Palpitations: no  Carbonated beverages: no N/V/D/C/GAS: no Abdominal Pain: no Dumping syndrome: no  Recent physical activity:  ADL's  Progress Towards Goal(s):  In Progress   Pt Chosen Goals:  Aim to eat non starchy vegetables 2 times a day 7 days a week    Teaching Method Utilized:  Visual Auditory Hands on  Demonstrated degree of understanding via:  Teach Back   Monitoring/Evaluation:  Dietary intake, exercise, and body weight.

## 2020-08-30 ENCOUNTER — Encounter: Payer: Self-pay | Admitting: Orthopaedic Surgery

## 2020-08-30 ENCOUNTER — Ambulatory Visit (INDEPENDENT_AMBULATORY_CARE_PROVIDER_SITE_OTHER): Payer: 59 | Admitting: Orthopaedic Surgery

## 2020-08-30 ENCOUNTER — Ambulatory Visit (INDEPENDENT_AMBULATORY_CARE_PROVIDER_SITE_OTHER): Payer: 59

## 2020-08-30 ENCOUNTER — Other Ambulatory Visit: Payer: Self-pay

## 2020-08-30 VITALS — Ht 64.0 in | Wt 192.0 lb

## 2020-08-30 DIAGNOSIS — M25552 Pain in left hip: Secondary | ICD-10-CM | POA: Diagnosis not present

## 2020-08-30 DIAGNOSIS — M19071 Primary osteoarthritis, right ankle and foot: Secondary | ICD-10-CM

## 2020-08-30 DIAGNOSIS — M7062 Trochanteric bursitis, left hip: Secondary | ICD-10-CM

## 2020-08-30 HISTORY — DX: Trochanteric bursitis, left hip: M70.62

## 2020-08-30 MED ORDER — LIDOCAINE HCL 1 % IJ SOLN
2.0000 mL | INTRAMUSCULAR | Status: AC | PRN
  Administered 2020-08-30: 2 mL

## 2020-08-30 MED ORDER — BUPIVACAINE HCL 0.25 % IJ SOLN
2.0000 mL | INTRAMUSCULAR | Status: AC | PRN
  Administered 2020-08-30: 2 mL via INTRA_ARTICULAR

## 2020-08-30 NOTE — Progress Notes (Signed)
Office Visit Note   Patient: Hannah Lynch           Date of Birth: [birth year]           MRN: [id number] Visit Date: 08/30/2020              Requested by: Eartha Inch, MD [address] [place],  [place] [address] PCP: Eartha Inch, MD   Assessment & Plan: Visit Diagnoses:  1. Pain in left hip   2. Trochanteric bursitis, left hip   3. 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  Tolerated the procedure well.  She had marked relief in her pain to palpation after the injection.  Able to get off the table very quickly. #2: We have discussed the use of Voltaren gel for her foot that of the MTP joint.  She is not interested in any cortisone injection with that today or any surgical intervention.  Follow-Up Instructions: Return if symptoms worsen or fail to improve.   Orders:  Orders Placed This Encounter  Procedures  . Large Joint Inj: L greater trochanter  . XR HIP UNILAT W OR W/O PELVIS 2-3 VIEWS LEFT   No orders of the defined types were placed in this encounter.     Procedures: Large Joint Inj: L greater trochanter on 08/30/2020 2:30 PM Indications: pain and diagnostic evaluation Details: 22 G 3.5 in needle, lateral approach  Arthrogram: No  Medications: 2 mL lidocaine 1 %; 2 mL bupivacaine 0.25 % Outcome: tolerated well, no immediate complications Procedure, treatment alternatives, risks and benefits explained, specific risks discussed. Immediately prior to procedure a time out was called to verify the correct patient, procedure, equipment, support staff and site/side marked as required. Patient was prepped and draped in the usual sterile fashion.       Clinical Data: No additional findings.   Subjective: Chief Complaint  Patient presents with  . Left Hip - Pain  . Right Foot - Pain   HPI Patient presents today for her left hip and right foot.  She said that her left hip has been hurting for two weeks. Her pain is worse upon going up stairs. The pain is located laterally and only occurs with weightbearing. No groin, buttock, or back pain. She is also having pain in her right foot. It has been hurting for two months. Her pain is located in great toe and at the base of her toe. Wearing sneakers makes the pain worse. She is not taking anything for pain.    Review of Systems  Constitutional: Negative for fatigue.  HENT: Negative for ear pain.   Eyes: Negative for pain.  Respiratory: Negative for shortness of breath.   Cardiovascular: Negative for leg swelling.  Gastrointestinal: Negative for constipation and diarrhea.  Endocrine: Negative for cold intolerance and heat intolerance.  Genitourinary: Negative for difficulty urinating.  Musculoskeletal: Negative for joint swelling.  Skin: Negative for rash.  Allergic/Immunologic: Negative for food allergies.  Neurological: Negative for weakness.  Hematological: Does not bruise/bleed easily.  Psychiatric/Behavioral: Negative for sleep disturbance.     Objective: Vital Signs: Ht 5\' 4"  (1.626 m)   Wt 192 lb (87.1 kg)   BMI 32.96 kg/m   Physical Exam Constitutional:      Appearance: Normal appearance. She is well-developed.  HENT:     Head: Normocephalic.     Nose: Nose  normal.  Eyes:     Pupils: Pupils are equal, round, and reactive to light.  Pulmonary:     Effort: Pulmonary effort is normal.  Skin:    General: Skin is warm and dry.  Neurological:     Mental Status: She is alert and oriented to person, place, and time.  Psychiatric:        Mood and Affect: Mood normal.        Behavior: Behavior normal.        Thought Content: Thought content normal.        Judgment: Judgment normal.     Ortho Exam  Left hip today reveals tenderness to palpation over the greater trochanteric region.  No groin pain elicited.  She is got good motion of the hip without pain in the groin.   Negative straight leg raising.  In regards to her right great toe she does have tenderness to palpation along the MTP joint.  She has about 1520 degrees of dorsiflexion about 10 degrees of plantarflexion at the MTP.  There is crepitance with range of motion noted.  Specialty Comments:  No specialty comments available.  Imaging: XR HIP UNILAT W OR W/O PELVIS 2-3 VIEWS LEFT  Result Date: 08/30/2020 2 view x-ray of the left hip reveals some calcification along the greater trochanteric region.  She does have some internal fixation of the lower lumbar spine.  She is maintaining a good superior joint space.    PMFS History: Current Outpatient Medications  Medication Sig Dispense Refill  . amitriptyline (ELAVIL) 50 MG tablet Take 50 mg by mouth at bedtime.    . cetirizine (ZYRTEC) 10 MG chewable tablet Chew 1 tablet (10 mg total) by mouth daily. 20 tablet 0  . fluticasone (FLONASE) 50 MCG/ACT nasal spray Place 2 sprays into both nostrils daily. 16 g 0  . gabapentin (NEURONTIN) 100 MG capsule Take 2 capsules (200 mg total) by mouth every 12 (twelve) hours. 20 capsule 0  . levothyroxine (SYNTHROID, LEVOTHROID) 25 MCG tablet Take 25 mcg by mouth daily before breakfast.     . lisinopril (ZESTRIL) 20 MG tablet Take 20 mg by mouth daily.    . montelukast (SINGULAIR) 10 MG tablet Take 10 mg by mouth daily.    Marland Kitchen omeprazole (PRILOSEC) 20 MG capsule Take 20 mg by mouth daily.  3  . ondansetron (ZOFRAN-ODT) 4 MG disintegrating tablet Take 1 tablet (4 mg total) by mouth every 6 (six) hours as needed for nausea or vomiting. 20 tablet 0  . oxycodone (OXY-IR) 5 MG capsule Take 1-2 capsules (5-10 mg total) by mouth every 4 (four) hours as needed. 30 capsule 0  . oxyCODONE (ROXICODONE) 5 MG immediate release tablet Take 1 tablet (5 mg total) by mouth every 4 (four) hours as needed for moderate pain or severe pain. 35 tablet 0  . oxyCODONE-acetaminophen (PERCOCET/ROXICET) 5-325 MG tablet Take 1-2 tablets by  mouth every 4 (four) hours as needed for severe pain. 30 tablet 0  . SUMAtriptan (IMITREX) 50 MG tablet Take 50 mg by mouth every 2 (two) hours as needed for migraine.     . traMADol (ULTRAM) 50 MG tablet Take 1 tablet (50 mg total) by mouth every 6 (six) hours as needed (pain). 10 tablet 0   No current facility-administered medications for this visit.   Facility-Administered Medications Ordered in Other Visits  Medication Dose Route Frequency Provider Last Rate Last Admin  . lactated ringers infusion    Continuous PRN Jeani Hawking,  CRNA   Continued from Pre-op at 03/26/18 1215    Patient Active Problem List   Diagnosis Date Noted  . Trochanteric bursitis, left hip 08/30/2020  . 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 Pain in left knee 10/12/2019  . Obesity (BMI 30-39.9) 09/27/2019  . GERD (gastroesophageal reflux disease) 09/27/2019  . Essential hypertension 09/27/2019  . Fatty liver 09/27/2019  . Metabolic syndrome 09/27/2019  . Pain in right ankle and joints of right foot 04/13/2019  . Chronic cholecystitis   . Primary insomnia 06/25/2017  . Papanicolaou smear for cervical cancer screening 06/25/2017  . Bilateral hearing loss 06/25/2017  . Acute serous otitis media 06/25/2017  . Lumbar stenosis with neurogenic claudication 09/25/2014   Past Medical History:  Diagnosis Date  . Anxiety   . Degenerative disc disease, lumbar   . Degenerative disc disease, lumbar 2012   lumbar and cervical region  . Depression   . Family history of adverse reaction to anesthesia    pt's mother has hx. of post-op N/V  . Headache   . Hypothyroidism   . IBS (irritable bowel syndrome)   . Nasal septal deviation 11/2017  . Nasal turbinate hypertrophy 11/2017  . PONV (postoperative nausea and vomiting)   . Snoring 11/2017    Family History  Problem Relation Age of Onset  . Hypertension Mother   . Diabetes Mother   . Heart failure Father   . Hypertension  Father   . Diabetes Father   . Heart failure Other   . Breast cancer Paternal Grandmother     Past Surgical History:  Procedure Laterality Date  . ABDOMINAL HYSTERECTOMY  2005   partial  . ANTERIOR CERVICAL DECOMP/DISCECTOMY FUSION  03/25/2007   C4-5  . CERVICAL FUSION  2013  . CHOLECYSTECTOMY N/A 03/26/2018   Procedure: LAPAROSCOPIC CHOLECYSTECTOMY;  Surgeon: Franky Macho, MD;  Location: [facility];  Service: General;  Laterality: N/A;  . KNEE ARTHROSCOPY Right   . LAPAROSCOPIC GASTRIC SLEEVE RESECTION N/A 09/27/2019   Procedure: LAPAROSCOPIC GASTRIC SLEEVE RESECTION, HIATAL HERNIA REPARI ;Upper Endo, Eras Pathway;  Surgeon: Gaynelle Adu, MD;  Location: [facility];  Service: General;  Laterality: N/A;  . LUMBAR LAMINECTOMY/DECOMPRESSION MICRODISCECTOMY  11/21/2004   L4-5  . MAXIMUM ACCESS (MAS)POSTERIOR LUMBAR INTERBODY FUSION (PLIF) 1 LEVEL N/A 09/25/2014   Procedure: Lumbar four-five MAXIMUM ACCESS (MAS) POSTERIOR LUMBAR INTERBODY FUSION (PLIF) 1 LEVEL;  Surgeon: Maeola Harman, MD;  Location: [facility];  Service: Neurosurgery;  Laterality: N/A;  . NASAL SEPTOPLASTY W/ TURBINOPLASTY N/A 12/07/2017   Procedure: NASAL SEPTOPLASTY WITH BILATERAL TURBINATE REDUCTION;  Surgeon: Christia Reading, MD;  Location: [facility];  Service: ENT;  Laterality: N/A;  . ORIF ANKLE FRACTURE Right   . TONSILLECTOMY  06/2018  . UPPER GI ENDOSCOPY     Social History   Occupational History  . Not on file  Tobacco Use  . Smoking status: Never Smoker  . Smokeless tobacco: Never Used  Vaping Use  . Vaping Use: Never used  Substance and Sexual Activity  . Alcohol use: No  . Drug use: No  . Sexual activity: Yes    Birth control/protection: None

## 2020-09-03 ENCOUNTER — Encounter: Payer: Self-pay | Admitting: Orthopaedic Surgery

## 2020-09-03 MED ORDER — METHYLPREDNISOLONE 4 MG PO TBPK: ORAL_TABLET | ORAL | 0 refills | Status: DC

## 2020-09-03 NOTE — Telephone Encounter (Signed)
Please send in a medrol dosepack

## 2020-09-17 ENCOUNTER — Other Ambulatory Visit: Payer: Self-pay

## 2020-09-17 DIAGNOSIS — G8929 Other chronic pain: Secondary | ICD-10-CM

## 2020-09-17 DIAGNOSIS — M545 Low back pain, unspecified: Secondary | ICD-10-CM

## 2020-09-17 NOTE — Telephone Encounter (Signed)
MRI L-S spine with MARS technique-has instrumentation at L4-L5 with RLE pain and DJD at L5-S1. Can have tramadol 50 mg #30 1 tab po bid prn if she wants-thanks

## 2020-09-24 ENCOUNTER — Encounter: Payer: 59 | Attending: General Surgery | Admitting: Skilled Nursing Facility1

## 2020-09-24 ENCOUNTER — Other Ambulatory Visit: Payer: Self-pay

## 2020-09-24 DIAGNOSIS — E669 Obesity, unspecified: Secondary | ICD-10-CM | POA: Diagnosis not present

## 2020-09-24 NOTE — Progress Notes (Signed)
Follow-up visit:  Post-Operative sleeve Surgery   Primary concerns today: Post-operative Bariatric Surgery Nutrition Management   Surgery date: 09/27/19 Surgery type: sleeve Start weight at [facility] 231.3 Weight today: 198.5   Pt states she was put back on blood pressure medicine and takes her blood pressure every other day stating she does see her cardiologist coming up soon. Pt states she really does not eat non starchy vegetables but is wiling to try new ones. Pt states since having surgery she avoids adding butter to her foods.   Pt states she was started on a medication to lower her heart rate which was 121. Pt states she did try eating some broccoli which went okay. Pt states she checks her blood pressure 2 times a day every day.   24 hr recall:  Breakfast: 2 egg + grits  Snack: wheat thins or half banana or peanuts  Lunch: canned soup + salad: lettuce, tomato cucumber, cheese, fat free ranch Snack: fruit or peanut butter + wheat thins Dinner: mashed potatoes, baked chicken or pork chop or shrimp   Beverages: 2 % milk, 62-68oz water, diet juice, coffee   Medications: See List Supplementation: multi and calcium  Physical activity: walking and exercising bike: 5 days a week  Using straws: no Drinking while eating: no Having you been chewing well: yes Chewing/swallowing difficulties: no Changes in vision: no Changes to mood/headaches: yes: once a week Hair loss/Cahnges to skin/Changes to nails: no Any difficulty focusing or concentrating: no Sweating: no Dizziness/Lightheaded: some (stating they are vertigo) Palpitations: no  Carbonated beverages: no N/V/D/C/GAS: no Abdominal Pain: no Dumping syndrome: no  Recent physical activity:  Walking 5 days a week  Progress Towards Goal(s):  In Progress  Encouraged patient to honor their body's internal hunger and fullness cues.  Throughout the day, check in mentally and rate hunger. Stop eating when satisfied not full  regardless of how much food is left on the plate.  Get more if still hungry 20-30 minutes later.  The key is to honor satisfaction so throughout the meal, rate fullness factor and stop when comfortably satisfied not physically full. The key is to honor hunger and fullness without any feelings of guilt or shame.  Pay attention to what the internal cues are, rather than any external factors. This will enhance the confidence you have in listening to your own body and following those internal cues enabling you to increase how often you eat when you are hungry not out of appetite and stop when you are satisfied not full.  Encouraged pt to continue to eat balanced meals inclusive of non starchy vegetables 2 times a day 7 days a week Encouraged pt to choose lean protein sources: limiting beef, pork, sausage, hotdogs, and lunch meat Encourage pt to choose healthy fats such as plant based limiting animal fats Encouraged pt to continue to drink a minium 64 fluid ounces with half being plain water to satisfy proper hydration    Pt Chosen Goals: Aim to eat non starchy vegetables 2 times a day 7 days a week Continue to maintain this changes even with changes in life Please reach out with any concerns or questions   Teaching Method Utilized:  Visual Auditory Hands on  Demonstrated degree of understanding via:  Teach Back   Monitoring/Evaluation:  Dietary intake, exercise, and body weight.

## 2020-10-01 ENCOUNTER — Ambulatory Visit (INDEPENDENT_AMBULATORY_CARE_PROVIDER_SITE_OTHER): Payer: 59 | Admitting: Orthopaedic Surgery

## 2020-10-01 ENCOUNTER — Encounter: Payer: Self-pay | Admitting: Orthopaedic Surgery

## 2020-10-01 ENCOUNTER — Other Ambulatory Visit: Payer: Self-pay

## 2020-10-01 VITALS — Ht 64.0 in | Wt 198.0 lb

## 2020-10-01 DIAGNOSIS — M48062 Spinal stenosis, lumbar region with neurogenic claudication: Secondary | ICD-10-CM | POA: Diagnosis not present

## 2020-10-01 DIAGNOSIS — M545 Low back pain, unspecified: Secondary | ICD-10-CM

## 2020-10-01 DIAGNOSIS — G8929 Other chronic pain: Secondary | ICD-10-CM

## 2020-10-01 NOTE — Progress Notes (Signed)
Office Visit Note   Patient: Hannah Lynch           Date of Birth: [birth year]           MRN: [id number] Visit Date: 10/01/2020              Requested by: Eartha Inch, MD [address] [place],  [place] [address] PCP: Eartha Inch, MD   Assessment & Plan: Visit Diagnoses:  1. Chronic low back pain, unspecified back pain laterality, unspecified whether sciatica present   2. Lumbar stenosis with neurogenic claudication     Plan: Hannah Lynch had an MRI scan of her lumbar spine revealing facet joint changes at L1-2, L2-3 and L3-4.  She has had a prior fusion at L4-5.  At L5-S1 there was disc desiccation with a posterior disc protrusion more prominent on the left.  There was mild spinal canal narrowing and moderate left lateral recess narrowing.  She is symptomatic on the left side.  The scan was not significantly changed from most scan she had in May 2020.  Long discussion regarding all of the above.  I think it is worth having Dr. Alvester Morin evaluate her for consideration of an epidural steroid injection.  She is experiencing some claudication and I would not be surprised if it is related to the L5-S1 level.  She is tried over-the-counter medicines and even prednisone.  Previously injected the greater trochanter on the left without much relief  Follow-Up Instructions: Return in about 6 weeks (around 11/12/2020).   Orders:  Orders Placed This Encounter  Procedures  . Ambulatory referral to Physical Medicine Rehab   No orders of the defined types were placed in this encounter.     Procedures: No procedures performed   Clinical Data: No additional findings.   Subjective: Chief Complaint  Patient presents with  . Lower Back - Follow-up    MRI review  Patient presents today for follow up on her lower back. She had an MRI done at [facility] on 09/24/2020. No changes since her last visit.  HPI  Review of Systems   Objective: Vital Signs: Ht 5\' 4"  (1.626 m)   Wt 198 lb  (89.8 kg)   BMI 33.99 kg/m   Physical Exam Constitutional:      Appearance: She is well-developed.  Eyes:     Pupils: Pupils are equal, round, and reactive to light.  Pulmonary:     Effort: Pulmonary effort is normal.  Skin:    General: Skin is warm and dry.  Neurological:     Mental Status: She is alert and oriented to person, place, and time.  Psychiatric:        Behavior: Behavior normal.     Ortho Exam awake alert and oriented x3.  Comfortable sitting.  Straight leg raise was positive for some hamstring tightness on the right and on the left.  Motor exam intact.  Good sensibility.  Painless range in both hips and knees.  Some mild percussible tenderness lumbar spine  Specialty Comments:  No specialty comments available.  Imaging: No results found.   PMFS History: Patient Active Problem List   Diagnosis Date Noted  . Trochanteric bursitis, left hip 08/30/2020  . 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 Pain in left knee 10/12/2019  . Obesity (BMI 30-39.9) 09/27/2019  . GERD (gastroesophageal reflux disease) 09/27/2019  . Essential hypertension 09/27/2019  . Fatty liver 09/27/2019  . Metabolic syndrome 09/27/2019  .  Pain in right ankle and joints of right foot 04/13/2019  . Chronic cholecystitis   . Primary insomnia 06/25/2017  . Papanicolaou smear for cervical cancer screening 06/25/2017  . Bilateral hearing loss 06/25/2017  . Acute serous otitis media 06/25/2017  . Lumbar stenosis with neurogenic claudication 09/25/2014   Past Medical History:  Diagnosis Date  . Anxiety   . Degenerative disc disease, lumbar   . Degenerative disc disease, lumbar 2012   lumbar and cervical region  . Depression   . Family history of adverse reaction to anesthesia    pt's mother has hx. of post-op N/V  . Headache   . Hypothyroidism   . IBS (irritable bowel syndrome)   . Nasal septal deviation 11/2017  . Nasal turbinate hypertrophy 11/2017  .  PONV (postoperative nausea and vomiting)   . Snoring 11/2017    Family History  Problem Relation Age of Onset  . Hypertension Mother   . Diabetes Mother   . Heart failure Father   . Hypertension Father   . Diabetes Father   . Heart failure Other   . Breast cancer Paternal Grandmother     Past Surgical History:  Procedure Laterality Date  . ABDOMINAL HYSTERECTOMY  2005   partial  . ANTERIOR CERVICAL DECOMP/DISCECTOMY FUSION  03/25/2007   C4-5  . CERVICAL FUSION  2013  . CHOLECYSTECTOMY N/A 03/26/2018   Procedure: LAPAROSCOPIC CHOLECYSTECTOMY;  Surgeon: Franky Macho, MD;  Location: [facility];  Service: General;  Laterality: N/A;  . KNEE ARTHROSCOPY Right   . LAPAROSCOPIC GASTRIC SLEEVE RESECTION N/A 09/27/2019   Procedure: LAPAROSCOPIC GASTRIC SLEEVE RESECTION, HIATAL HERNIA REPARI ;Upper Endo, Eras Pathway;  Surgeon: Gaynelle Adu, MD;  Location: [facility];  Service: General;  Laterality: N/A;  . LUMBAR LAMINECTOMY/DECOMPRESSION MICRODISCECTOMY  11/21/2004   L4-5  . MAXIMUM ACCESS (MAS)POSTERIOR LUMBAR INTERBODY FUSION (PLIF) 1 LEVEL N/A 09/25/2014   Procedure: Lumbar four-five MAXIMUM ACCESS (MAS) POSTERIOR LUMBAR INTERBODY FUSION (PLIF) 1 LEVEL;  Surgeon: Maeola Harman, MD;  Location: [facility];  Service: Neurosurgery;  Laterality: N/A;  . NASAL SEPTOPLASTY W/ TURBINOPLASTY N/A 12/07/2017   Procedure: NASAL SEPTOPLASTY WITH BILATERAL TURBINATE REDUCTION;  Surgeon: Christia Reading, MD;  Location: [facility];  Service: ENT;  Laterality: N/A;  . ORIF ANKLE FRACTURE Right   . TONSILLECTOMY  06/2018  . UPPER GI ENDOSCOPY     Social History   Occupational History  . Not on file  Tobacco Use  . Smoking status: Never Smoker  . Smokeless tobacco: Never Used  Vaping Use  . Vaping Use: Never used  Substance and Sexual Activity  . Alcohol use: No  . Drug use: No  . Sexual activity: Yes    Birth control/protection: None

## 2020-10-07 ENCOUNTER — Other Ambulatory Visit: Payer: 59

## 2020-10-16 DIAGNOSIS — Z9889 Other specified postprocedural states: Secondary | ICD-10-CM | POA: Insufficient documentation

## 2020-10-17 ENCOUNTER — Ambulatory Visit (INDEPENDENT_AMBULATORY_CARE_PROVIDER_SITE_OTHER): Payer: 59 | Admitting: Orthopaedic Surgery

## 2020-10-17 ENCOUNTER — Encounter: Payer: Self-pay | Admitting: Orthopaedic Surgery

## 2020-10-17 ENCOUNTER — Ambulatory Visit (INDEPENDENT_AMBULATORY_CARE_PROVIDER_SITE_OTHER): Payer: 59

## 2020-10-17 ENCOUNTER — Other Ambulatory Visit: Payer: Self-pay

## 2020-10-17 VITALS — Ht 64.0 in | Wt 198.0 lb

## 2020-10-17 DIAGNOSIS — M25562 Pain in left knee: Secondary | ICD-10-CM

## 2020-10-17 NOTE — Progress Notes (Signed)
Office Visit Note   Patient: Hannah Lynch           Date of Birth: 1971-02-06           MRN: [id number] Visit Date: 10/17/2020              Requested by: Eartha Inch, MD [address] [place],  [place] [address] PCP: Eartha Inch, MD   Assessment & Plan: Visit Diagnoses:  1. Acute pain of left knee     Plan: Mrs. Novick fell about 5 weeks ago at her apartment complex.  She slipped with her right leg and then fell directly on the anterior aspect of her left knee.  She had a little scab that has healed but's had had an exacerbation of her left knee pain.  She has had 2 prior knee arthroscopies the last of which was in December 2020.  She had considerable chondromalacia of the patellofemoral joint and to some extent grade 2 changes in the medial lateral compartment.  I think she has exacerbated the arthritis.  There is no effusion.  We have talked about using NSAIDs and Voltaren gel and exercising.  We will plan to see her back as a as needed basis.  All questions were answered  Follow-Up Instructions: Return if symptoms worsen or fail to improve.   Orders:  Orders Placed This Encounter  Procedures  . XR KNEE 3 VIEW LEFT   No orders of the defined types were placed in this encounter.     Procedures: No procedures performed   Clinical Data: No additional findings.   Subjective: Chief Complaint  Patient presents with  . Left Knee - Pain  Patient presents today for her left knee. She fell on 09/11/2020 and landed directed on her knee. She said that she has not had it evaluated anywhere before today. She continues to have pain inferior to her patella and also laterally. She has pain when trying to sleep and also with flexion.   HPI  Review of Systems   Objective: Vital Signs: Ht 5\' 4"  (1.626 m)   Wt 198 lb (89.8 kg)   BMI 33.99 kg/m   Physical Exam Constitutional:      Appearance: She is well-developed.  Eyes:     Pupils: Pupils are equal, round, and  reactive to light.  Pulmonary:     Effort: Pulmonary effort is normal.  Skin:    General: Skin is warm and dry.  Neurological:     Mental Status: She is alert and oriented to person, place, and time.  Psychiatric:        Behavior: Behavior normal.     Ortho Exam awake alert and oriented x3.  Comfortable sitting.  Left knee without effusion.  No particular areas of discomfort except about the patellofemoral joint where there was crepitation and mild pain with compression.  No effusion.  No evidence of instability.  No popliteal pain or mass.  No calf pain or distal edema. Specialty Comments:  No specialty comments available.  Imaging: XR KNEE 3 VIEW LEFT  Result Date: 10/17/2020 Films of the left knee were obtained in several projections.  There were no acute changes or evidence of ectopic calcification.  Mild degenerative changes the patellofemoral joint and possibly slight narrowing of the medial lateral compartments but no evidence of malalignment.  Patient's had prior arthroscopy and MRI scan demonstrating some degenerative changes in all 3 compartments.    PMFS History: Patient Active Problem List  Diagnosis Date Noted  . Trochanteric bursitis, left hip 08/30/2020  . 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 Pain in left knee 10/12/2019  . Obesity (BMI 30-39.9) 09/27/2019  . GERD (gastroesophageal reflux disease) 09/27/2019  . Essential hypertension 09/27/2019  . Fatty liver 09/27/2019  . Metabolic syndrome 09/27/2019  . Pain in right ankle and joints of right foot 04/13/2019  . Chronic cholecystitis   . Primary insomnia 06/25/2017  . Papanicolaou smear for cervical cancer screening 06/25/2017  . Bilateral hearing loss 06/25/2017  . Acute serous otitis media 06/25/2017  . Lumbar stenosis with neurogenic claudication 09/25/2014   Past Medical History:  Diagnosis Date  . Anxiety   . Degenerative disc disease, lumbar   . Degenerative disc  disease, lumbar 2012   lumbar and cervical region  . Depression   . Family history of adverse reaction to anesthesia    pt's mother has hx. of post-op N/V  . Headache   . Hypothyroidism   . IBS (irritable bowel syndrome)   . Nasal septal deviation 11/2017  . Nasal turbinate hypertrophy 11/2017  . PONV (postoperative nausea and vomiting)   . Snoring 11/2017    Family History  Problem Relation Age of Onset  . Hypertension Mother   . Diabetes Mother   . Heart failure Father   . Hypertension Father   . Diabetes Father   . Heart failure Other   . Breast cancer Paternal Grandmother     Past Surgical History:  Procedure Laterality Date  . ABDOMINAL HYSTERECTOMY  2005   partial  . ANTERIOR CERVICAL DECOMP/DISCECTOMY FUSION  03/25/2007   C4-5  . CERVICAL FUSION  2013  . CHOLECYSTECTOMY N/A 03/26/2018   Procedure: LAPAROSCOPIC CHOLECYSTECTOMY;  Surgeon: Franky Macho, MD;  Location: [facility];  Service: General;  Laterality: N/A;  . KNEE ARTHROSCOPY Right   . LAPAROSCOPIC GASTRIC SLEEVE RESECTION N/A 09/27/2019   Procedure: LAPAROSCOPIC GASTRIC SLEEVE RESECTION, HIATAL HERNIA REPARI ;Upper Endo, Eras Pathway;  Surgeon: Gaynelle Adu, MD;  Location: [facility];  Service: General;  Laterality: N/A;  . LUMBAR LAMINECTOMY/DECOMPRESSION MICRODISCECTOMY  11/21/2004   L4-5  . MAXIMUM ACCESS (MAS)POSTERIOR LUMBAR INTERBODY FUSION (PLIF) 1 LEVEL N/A 09/25/2014   Procedure: Lumbar four-five MAXIMUM ACCESS (MAS) POSTERIOR LUMBAR INTERBODY FUSION (PLIF) 1 LEVEL;  Surgeon: Maeola Harman, MD;  Location: [facility];  Service: Neurosurgery;  Laterality: N/A;  . NASAL SEPTOPLASTY W/ TURBINOPLASTY N/A 12/07/2017   Procedure: NASAL SEPTOPLASTY WITH BILATERAL TURBINATE REDUCTION;  Surgeon: Christia Reading, MD;  Location: [facility];  Service: ENT;  Laterality: N/A;  . ORIF ANKLE FRACTURE Right   . TONSILLECTOMY  06/2018  . UPPER GI ENDOSCOPY     Social History   Occupational History  . Not on  file  Tobacco Use  . Smoking status: Never Smoker  . Smokeless tobacco: Never Used  Vaping Use  . Vaping Use: Never used  Substance and Sexual Activity  . Alcohol use: No  . Drug use: No  . Sexual activity: Yes    Birth control/protection: None

## 2020-10-30 ENCOUNTER — Encounter: Payer: Self-pay | Admitting: Physical Medicine and Rehabilitation

## 2020-10-30 ENCOUNTER — Other Ambulatory Visit: Payer: Self-pay

## 2020-10-30 ENCOUNTER — Ambulatory Visit: Payer: Self-pay

## 2020-10-30 ENCOUNTER — Ambulatory Visit (INDEPENDENT_AMBULATORY_CARE_PROVIDER_SITE_OTHER): Payer: 59 | Admitting: Physical Medicine and Rehabilitation

## 2020-10-30 VITALS — BP 104/77 | HR 98

## 2020-10-30 DIAGNOSIS — M5416 Radiculopathy, lumbar region: Secondary | ICD-10-CM | POA: Diagnosis not present

## 2020-10-30 DIAGNOSIS — M961 Postlaminectomy syndrome, not elsewhere classified: Secondary | ICD-10-CM

## 2020-10-30 MED ORDER — METHYLPREDNISOLONE ACETATE 80 MG/ML IJ SUSP
80.0000 mg | Freq: Once | INTRAMUSCULAR | Status: AC
  Administered 2020-10-30: 80 mg

## 2020-10-30 NOTE — Progress Notes (Signed)
Pt state lower back pain that travels down her left leg. Pt state walking, standing and climbing stairs makes the pain. Pt state laying down or sitting helps ease the pain.  Numeric Pain Rating Scale and Functional Assessment Average Pain 5   In the last MONTH (on 0-10 scale) has pain interfered with the following?  1. General activity like being  able to carry out your everyday physical activities such as walking, climbing stairs, carrying groceries, or moving a chair?  Rating(8)   +Driver, -BT, -Dye Allergies.

## 2020-10-30 NOTE — Progress Notes (Signed)
Hannah Lynch - 49 y.o. female MRN [id number]  Date of birth: 1971-08-09  Office Visit Note: Visit Date: 10/30/2020 PCP: Eartha Inch, MD Referred by: Eartha Inch, MD  Subjective: Chief Complaint  Patient presents with  . Lower Back - Pain  . Left Leg - Pain   HPI:  Hannah Lynch is a 49 y.o. female who comes in today at the request of Dr. Norlene Campbell for planned Left S1-2 Lumbar epidural steroid injection with fluoroscopic guidance.  The patient has failed conservative care including home exercise, medications, time and activity modification.  This injection will be diagnostic and hopefully therapeutic.  Please see requesting physician notes for further details and justification.  MRI reviewed with images and spine model.  MRI reviewed in the note below.    ROS Otherwise per HPI.  Assessment & Plan: Visit Diagnoses:    ICD-10-CM   1. Lumbar radiculopathy  M54.16 XR C-ARM NO REPORT    Epidural Steroid injection    methylPREDNISolone acetate (DEPO-MEDROL) injection 80 mg  2. 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   Meds & Orders:  Meds ordered this encounter  Medications  . methylPREDNISolone acetate (DEPO-MEDROL) injection 80 mg    Orders Placed This Encounter  Procedures  . XR C-ARM NO REPORT  . Epidural Steroid injection    Follow-up: Return if symptoms worsen or fail to improve.   Procedures: No procedures performed  S1 Lumbosacral Transforaminal Epidural Steroid Injection - Sub-Pedicular Approach with Fluoroscopic Guidance   Patient: Hannah Lynch      Date of Birth: 1971-04-13 MRN: [id number] PCP: Eartha Inch, MD      Visit Date: 10/30/2020   Universal Protocol:    Date/Time: 12/21/211:24 PM  Consent Given By: the patient  Position:  PRONE  Additional Comments: Vital signs were monitored before and after the procedure. Patient was prepped and draped in the usual sterile fashion. The correct  patient, procedure, and site was verified.   Injection Procedure Details:  Procedure Site One Meds Administered:  Meds ordered this encounter  Medications  . methylPREDNISolone acetate (DEPO-MEDROL) injection 80 mg    Laterality: Left  Location/Site:  S1 Foramen   Needle size: 22 ga.  Needle type: Spinal  Needle Placement: Transforaminal  Findings:   -Comments: Excellent flow of contrast along the nerve, nerve root and into the epidural space.  Epidurogram: Contrast epidurogram showed no nerve root cut off or restricted flow pattern.  Procedure Details: After squaring off the sacral end-plate to get a true AP view, the C-arm was positioned so that the best possible view of the S1 foramen was visualized. The soft tissues overlying this structure were infiltrated with 2-3 ml. of 1% Lidocaine without Epinephrine.    The spinal needle was inserted toward the target using a "trajectory" view along the fluoroscope beam.  Under AP and lateral visualization, the needle was advanced so it did not puncture dura. Biplanar projections were used to confirm position. Aspiration was confirmed to be negative for CSF and/or blood. A 1-2 ml. volume of Isovue-250 was injected and flow of contrast was noted at each level. Radiographs were obtained for documentation purposes.   After attaining the desired flow of contrast documented above, a 0.5 to 1.0 ml test dose of 0.25% Marcaine was injected into each respective transforaminal space.  The patient was observed for 90 seconds post injection.  After no sensory deficits were reported, and normal lower  extremity motor function was noted,   the above injectate was administered so that equal amounts of the injectate were placed at each foramen (level) into the transforaminal epidural space.   Additional Comments:  The patient tolerated the procedure well Dressing: Band-Aid with 2 x 2 sterile gauze    Post-procedure details: Patient was observed  during the procedure. Post-procedure instructions were reviewed.  Patient left the clinic in stable condition.     Clinical History: STUDY: MRI of the lumbar spine without intravenous contrast performed on 09/24/2020 10:14 AM.   COMPARISON: 03/30/2019.   TECHNIQUE: Multiplanar, multisequence MR imaging obtained through the lumbar spine without contrast on 09/24/2020 10:14 AM.   CONTRAST: None.   FINDINGS:  # Osseous structures: Prior posterior decompression with hardware fixation that includes bilateral pedicle screws at L4 and L5. Intervertebral spacer noted at L4-L5. Visualized vertebral body heights and marrow signal unremarkable. No evidence of an acute fracture.  # Alignment:There is a mild curvature to the LEFT.  # Conus medullaris/cauda equina: Spinal cord signal is normal and terminates at L1. The cauda equina is unremarkable.   # Lower thoracic spine: Disc desiccation and a small disc bulge at T11-T12 effacing the ventral thecal sac. Foramen are patent.   # T12-L1: Mild facet arthrosis. No significant disc herniation, spinal canal or neuroforaminal compromise.  # L1-L2: Disc desiccation with loss of disc height anda disc bulge. There is facet arthropathy. This results in mild spinal canal narrowing. Foramen are patent. This is unchanged.  # L2-L3: Facet arthrosis and ligamentum flavum redundancy. Small disc bulge. This results in moderate spinal canal narrowing and mild lateral recess narrowing. Mild RIGHT neuroforaminal stenosis. The LEFT neuroforamen is patent. This is unchanged.  # L3-L4: Disc desiccation with mild loss of disc height and a small disc bulge. There is a small central disc protrusion. Bilateral facet hypertrophy and ligamentum flavum redundancy appreciated. This results in mild spinal canal narrowing. Foramen are patent. This is unchanged.  # L4-L5: Postsurgical changes. Spinal canal and foramen are patent. This is unchanged.  # L5-S1: Disc desiccation  with a posterior disc protrusion that is more prominent on the LEFT. Posterior marginal osteophyte more prominent on the LEFT. There is facet arthropathy. Mild spinal canal narrowing and moderate LEFT lateral recess narrowing. Mild bilateral neuroforaminal stenosis, more moderate on the LEFT. This is unchanged.   # Paraspinal tissues: Unremarkable.   # Additional comments: None.    IMPRESSION:  1. No significant interval change.  2. Prior anterior and posterior fusion at L4-L5 without any significant spinal canal and neuroforaminal stenosis at this level.  3. Persistent degenerative disc disease and facet arthrosis as described above. Again this is most notable for moderate spinal canal narrowing at L2-L3; moderate LEFT lateral recess and neuroforaminal stenosis at L5-S1.   Electronically Signed by: Ardyth Man Specimen Collected: 09/24/20 11:47 AM     Objective:  VS:  HT:    WT:   BMI:     BP:104/77  HR:98bpm  TEMP: ( )  RESP:  Physical Exam Constitutional:      General: She is not in acute distress.    Appearance: Normal appearance. She is obese. She is not ill-appearing.  HENT:     Head: Normocephalic and atraumatic.     Right Ear: External ear normal.     Left Ear: External ear normal.  Eyes:     Extraocular Movements: Extraocular movements intact.  Cardiovascular:     Rate and Rhythm: Normal rate.  Pulses: Normal pulses.  Musculoskeletal:     Right lower leg: No edema.     Left lower leg: No edema.     Comments: Patient has good distal strength with no pain over the greater trochanters.  No clonus or focal weakness.  Skin:    Findings: No erythema, lesion or rash.  Neurological:     General: No focal deficit present.     Mental Status: She is alert and oriented to person, place, and time.     Sensory: No sensory deficit.     Motor: No weakness or abnormal muscle tone.     Coordination: Coordination normal.  Psychiatric:        Mood and Affect: Mood  normal.        Behavior: Behavior normal.      Imaging: No results found.

## 2020-10-30 NOTE — Procedures (Signed)
S1 Lumbosacral Transforaminal Epidural Steroid Injection - Sub-Pedicular Approach with Fluoroscopic Guidance   Patient: Hannah Lynch      Date of Birth: [birth year] MRN: [id number] PCP: Eartha Inch, MD      Visit Date: 10/30/2020   Universal Protocol:    Date/Time: 12/21/211:24 PM  Consent Given By: the patient  Position:  PRONE  Additional Comments: Vital signs were monitored before and after the procedure. Patient was prepped and draped in the usual sterile fashion. The correct patient, procedure, and site was verified.   Injection Procedure Details:  Procedure Site One Meds Administered:  Meds ordered this encounter  Medications  . methylPREDNISolone acetate (DEPO-MEDROL) injection 80 mg    Laterality: Left  Location/Site:  S1 Foramen   Needle size: 22 ga.  Needle type: Spinal  Needle Placement: Transforaminal  Findings:   -Comments: Excellent flow of contrast along the nerve, nerve root and into the epidural space.  Epidurogram: Contrast epidurogram showed no nerve root cut off or restricted flow pattern.  Procedure Details: After squaring off the sacral end-plate to get a true AP view, the C-arm was positioned so that the best possible view of the S1 foramen was visualized. The soft tissues overlying this structure were infiltrated with 2-3 ml. of 1% Lidocaine without Epinephrine.    The spinal needle was inserted toward the target using a "trajectory" view along the fluoroscope beam.  Under AP and lateral visualization, the needle was advanced so it did not puncture dura. Biplanar projections were used to confirm position. Aspiration was confirmed to be negative for CSF and/or blood. A 1-2 ml. volume of Isovue-250 was injected and flow of contrast was noted at each level. Radiographs were obtained for documentation purposes.   After attaining the desired flow of contrast documented above, a 0.5 to 1.0 ml test dose of 0.25% Marcaine was injected into each  respective transforaminal space.  The patient was observed for 90 seconds post injection.  After no sensory deficits were reported, and normal lower extremity motor function was noted,   the above injectate was administered so that equal amounts of the injectate were placed at each foramen (level) into the transforaminal epidural space.   Additional Comments:  The patient tolerated the procedure well Dressing: Band-Aid with 2 x 2 sterile gauze    Post-procedure details: Patient was observed during the procedure. Post-procedure instructions were reviewed.  Patient left the clinic in stable condition.

## 2020-11-13 ENCOUNTER — Ambulatory Visit (INDEPENDENT_AMBULATORY_CARE_PROVIDER_SITE_OTHER): Payer: 59 | Admitting: Orthopaedic Surgery

## 2020-11-13 ENCOUNTER — Encounter: Payer: Self-pay | Admitting: Orthopaedic Surgery

## 2020-11-13 ENCOUNTER — Other Ambulatory Visit: Payer: Self-pay

## 2020-11-13 VITALS — Ht 64.0 in | Wt 198.0 lb

## 2020-11-13 DIAGNOSIS — M25562 Pain in left knee: Secondary | ICD-10-CM | POA: Diagnosis not present

## 2020-11-13 MED ORDER — BUPIVACAINE HCL 0.25 % IJ SOLN
2.0000 mL | INTRAMUSCULAR | Status: AC | PRN
  Administered 2020-11-13: 2 mL via INTRA_ARTICULAR

## 2020-11-13 NOTE — Progress Notes (Signed)
Office Visit Note   Patient: Hannah Lynch           Date of Birth: February 09, 1971           MRN: [id number] Visit Date: 11/13/2020              Requested by: Eartha Inch, MD [address] [place],  [place] [address] PCP: Eartha Inch, MD   Assessment & Plan: Visit Diagnoses:  1. Acute pain of left knee     Plan: Ms. Dowson has had persistent pain along the lateral and posterior aspect of her left knee after her fall recently.  I think she has had an exacerbation of her pre-existing arthritis noted at the time of the prior arthroscopies.  I am going to inject the lateral compartment with Marcaine and betamethasone and monitor her response  Follow-Up Instructions: Return if symptoms worsen or fail to improve.   Orders:  Orders Placed This Encounter  Procedures  . Large Joint Inj: L knee   No orders of the defined types were placed in this encounter.     Procedures: Large Joint Inj: L knee on 11/13/2020 2:55 PM Indications: pain and diagnostic evaluation Details: 25 G 1.5 in needle, anteromedial approach  Arthrogram: No  Medications: 2 mL bupivacaine 0.25 %  2 mL betamethasone injected with Marcaine into the left knee Procedure, treatment alternatives, risks and benefits explained, specific risks discussed. Consent was given by the patient. Patient was prepped and draped in the usual sterile fashion.       Clinical Data: No additional findings.   Subjective: Chief Complaint  Patient presents with  . Left Knee - Pain  Patient presents today for left knee pain. She was here last month for her knee pain. She said that it is not improving. Her pain is located laterally and posteriorly. No swelling. She said that the pain keeps her awake at night. She has tried Tylenol for pain, but that does not help.   HPI  Review of Systems   Objective: Vital Signs: Ht 5\' 4"  (1.626 m)   Wt 198 lb (89.8 kg)   BMI 33.99 kg/m   Physical Exam Constitutional:       Appearance: She is well-developed and well-nourished.  HENT:     Mouth/Throat:     Mouth: Oropharynx is clear and moist.  Eyes:     Extraocular Movements: EOM normal.     Pupils: Pupils are equal, round, and reactive to light.  Pulmonary:     Effort: Pulmonary effort is normal.  Skin:    General: Skin is warm and dry.  Neurological:     Mental Status: She is alert and oriented to person, place, and time.  Psychiatric:        Mood and Affect: Mood and affect normal.        Behavior: Behavior normal.     Ortho Exam left knee was not hot red warm or swollen.  No obvious effusion.  Did have some anterior lateral knee pain without crepitation.  Some patella crepitation but minimal discomfort.  No pain easily.  Full extension of flexed over 100 degrees without instability.  No popliteal pain or mass. Specialty Comments:  No specialty comments available.  Imaging: No results found.   PMFS History: Patient Active Problem List   Diagnosis Date Noted  . Trochanteric bursitis, left hip 08/30/2020  . 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 Pain in left  knee 10/12/2019  . Obesity (BMI 30-39.9) 09/27/2019  . GERD (gastroesophageal reflux disease) 09/27/2019  . Essential hypertension 09/27/2019  . Fatty liver 09/27/2019  . Metabolic syndrome 09/27/2019  . Pain in right ankle and joints of right foot 04/13/2019  . Chronic cholecystitis   . Primary insomnia 06/25/2017  . Papanicolaou smear for cervical cancer screening 06/25/2017  . Bilateral hearing loss 06/25/2017  . Acute serous otitis media 06/25/2017  . Lumbar stenosis with neurogenic claudication 09/25/2014   Past Medical History:  Diagnosis Date  . Anxiety   . Degenerative disc disease, lumbar   . Degenerative disc disease, lumbar 2012   lumbar and cervical region  . Depression   . Family history of adverse reaction to anesthesia    pt's mother has hx. of post-op N/V  . Headache   .  Hypothyroidism   . IBS (irritable bowel syndrome)   . Nasal septal deviation 11/2017  . Nasal turbinate hypertrophy 11/2017  . PONV (postoperative nausea and vomiting)   . Snoring 11/2017    Family History  Problem Relation Age of Onset  . Hypertension Mother   . Diabetes Mother   . Heart failure Father   . Hypertension Father   . Diabetes Father   . Heart failure Other   . Breast cancer Paternal Grandmother     Past Surgical History:  Procedure Laterality Date  . ABDOMINAL HYSTERECTOMY  2005   partial  . ANTERIOR CERVICAL DECOMP/DISCECTOMY FUSION  03/25/2007   C4-5  . CERVICAL FUSION  2013  . CHOLECYSTECTOMY N/A 03/26/2018   Procedure: LAPAROSCOPIC CHOLECYSTECTOMY;  Surgeon: Franky Macho, MD;  Location: [facility];  Service: General;  Laterality: N/A;  . KNEE ARTHROSCOPY Right   . LAPAROSCOPIC GASTRIC SLEEVE RESECTION N/A 09/27/2019   Procedure: LAPAROSCOPIC GASTRIC SLEEVE RESECTION, HIATAL HERNIA REPARI ;Upper Endo, Eras Pathway;  Surgeon: Gaynelle Adu, MD;  Location: [facility];  Service: General;  Laterality: N/A;  . LUMBAR LAMINECTOMY/DECOMPRESSION MICRODISCECTOMY  11/21/2004   L4-5  . MAXIMUM ACCESS (MAS)POSTERIOR LUMBAR INTERBODY FUSION (PLIF) 1 LEVEL N/A 09/25/2014   Procedure: Lumbar four-five MAXIMUM ACCESS (MAS) POSTERIOR LUMBAR INTERBODY FUSION (PLIF) 1 LEVEL;  Surgeon: Maeola Harman, MD;  Location: [facility];  Service: Neurosurgery;  Laterality: N/A;  . NASAL SEPTOPLASTY W/ TURBINOPLASTY N/A 12/07/2017   Procedure: NASAL SEPTOPLASTY WITH BILATERAL TURBINATE REDUCTION;  Surgeon: Christia Reading, MD;  Location: [facility];  Service: ENT;  Laterality: N/A;  . ORIF ANKLE FRACTURE Right   . TONSILLECTOMY  06/2018  . UPPER GI ENDOSCOPY     Social History   Occupational History  . Not on file  Tobacco Use  . Smoking status: Never Smoker  . Smokeless tobacco: Never Used  Vaping Use  . Vaping Use: Never used  Substance and Sexual Activity  . Alcohol use: No   . Drug use: No  . Sexual activity: Yes    Birth control/protection: None

## 2020-11-19 ENCOUNTER — Encounter: Payer: Self-pay | Admitting: Physical Medicine and Rehabilitation

## 2020-12-12 ENCOUNTER — Telehealth: Payer: Self-pay | Admitting: Physical Medicine and Rehabilitation

## 2020-12-12 NOTE — Telephone Encounter (Signed)
Patient called returning call from [facility]. Please call patient at [phone].

## 2020-12-12 NOTE — Telephone Encounter (Signed)
Spoke with patient she need to reschedule her appointment with Dr Alvester Morin

## 2020-12-12 NOTE — Telephone Encounter (Signed)
Left message #1 to reschedule lumbar ESI and to schedule an office visit/ consult on a different day to discuss neck pain per referral from [facility].

## 2020-12-13 NOTE — Telephone Encounter (Signed)
Lumbar ESI rescheduled and OV scheduled for neck pain.

## 2020-12-13 NOTE — Telephone Encounter (Signed)
See previous message

## 2020-12-17 ENCOUNTER — Ambulatory Visit: Payer: 59 | Admitting: Physical Medicine and Rehabilitation

## 2020-12-25 ENCOUNTER — Other Ambulatory Visit: Payer: Self-pay

## 2020-12-25 ENCOUNTER — Ambulatory Visit (INDEPENDENT_AMBULATORY_CARE_PROVIDER_SITE_OTHER): Payer: 59 | Admitting: Physical Medicine and Rehabilitation

## 2020-12-25 ENCOUNTER — Encounter: Payer: Self-pay | Admitting: Physical Medicine and Rehabilitation

## 2020-12-25 ENCOUNTER — Ambulatory Visit: Payer: Self-pay

## 2020-12-25 VITALS — BP 110/82 | HR 96

## 2020-12-25 DIAGNOSIS — M5416 Radiculopathy, lumbar region: Secondary | ICD-10-CM

## 2020-12-25 MED ORDER — DEXAMETHASONE SODIUM PHOSPHATE 10 MG/ML IJ SOLN
15.0000 mg | Freq: Once | INTRAMUSCULAR | Status: DC
Start: 2020-12-25 — End: 2020-12-25

## 2020-12-25 NOTE — Progress Notes (Signed)
Hannah Lynch - 50 y.o. female MRN [id number]  Date of birth: 01-25-1971  Office Visit Note: Visit Date: 12/25/2020 PCP: Hannah Inch, MD Referred by: Hannah Inch, MD  Subjective: Chief Complaint  Patient presents with  . Lower Back - Pain   HPI:  Hannah Lynch is a 50 y.o. female who comes in today for planned repeat Left S1-2 Lumbar epidural steroid injection with fluoroscopic guidance.  The patient has failed conservative care including home exercise, medications, time and activity modification.  This injection will be diagnostic and hopefully therapeutic.  Please see requesting physician notes for further details and justification. Patient received more than 50% pain relief from prior injection.  She in fact reports almost 100% relief for 2 weeks and the symptoms returned.  She did bring MRI CD today we did review that together.  There is clearly broad disc protrusion more left paracentral at L5-S1 below her fusion.  Referring: Dr. Norlene Lynch    ROS Otherwise per HPI.  Assessment & Plan: Visit Diagnoses:    ICD-10-CM   1. Lumbar radiculopathy  M54.16 XR C-ARM NO REPORT    Epidural Steroid injection    DISCONTINUED: dexamethasone (DECADRON) injection 15 mg    Plan: No additional findings.   Meds & Orders:  Meds ordered this encounter  Medications  . DISCONTD: dexamethasone (DECADRON) injection 15 mg    Orders Placed This Encounter  Procedures  . XR C-ARM NO REPORT  . Epidural Steroid injection    Follow-up: Return if symptoms worsen or fail to improve.   Procedures: No procedures performed  S1 Lumbosacral Transforaminal Epidural Steroid Injection - Sub-Pedicular Approach with Fluoroscopic Guidance   Patient: Hannah Lynch      Date of Birth: December 09, 1970 MRN: [id number] PCP: Hannah Inch, MD      Visit Date: 12/25/2020   Universal Protocol:    Date/Time: 02/15/224:37 PM  Consent Given By: the patient  Position:  PRONE  Additional  Comments: Vital signs were monitored before and after the procedure. Patient was prepped and draped in the usual sterile fashion. The correct patient, procedure, and site was verified.   Injection Procedure Details:  Procedure Site One Meds Administered:  Meds ordered this encounter  Medications  . DISCONTD: dexamethasone (DECADRON) injection 15 mg    Laterality: Left  Location/Site:  S1 Foramen   Needle size: 22 ga.  Needle type: Spinal  Needle Placement: Transforaminal  Findings:   -Comments: Excellent flow of contrast along the nerve, nerve root and into the epidural space.  Epidurogram: Contrast epidurogram showed no nerve root cut off or restricted flow pattern.  Procedure Details: After squaring off the sacral end-plate to get a true AP view, the C-arm was positioned so that the best possible view of the S1 foramen was visualized. The soft tissues overlying this structure were infiltrated with 2-3 ml. of 1% Lidocaine without Epinephrine.    The spinal needle was inserted toward the target using a "trajectory" view along the fluoroscope beam.  Under AP and lateral visualization, the needle was advanced so it did not puncture dura. Biplanar projections were used to confirm position. Aspiration was confirmed to be negative for CSF and/or blood. A 1-2 ml. volume of Isovue-250 was injected and flow of contrast was noted at each level. Radiographs were obtained for documentation purposes.   After attaining the desired flow of contrast documented above, a 0.5 to 1.0 ml test dose of 0.25% Marcaine was injected into each respective  transforaminal space.  The patient was observed for 90 seconds post injection.  After no sensory deficits were reported, and normal lower extremity motor function was noted,   the above injectate was administered so that equal amounts of the injectate were placed at each foramen (level) into the transforaminal epidural space.   Additional Comments:   The patient tolerated the procedure well Dressing: Band-Aid with 2 x 2 sterile gauze    Post-procedure details: Patient was observed during the procedure. Post-procedure instructions were reviewed.  Patient left the clinic in stable condition.     Clinical History: STUDY: MRI of the lumbar spine without intravenous contrast performed on 09/24/2020 10:14 AM.   COMPARISON: 03/30/2019.   TECHNIQUE: Multiplanar, multisequence MR imaging obtained through the lumbar spine without contrast on 09/24/2020 10:14 AM.   CONTRAST: None.   FINDINGS:  # Osseous structures: Prior posterior decompression with hardware fixation that includes bilateral pedicle screws at L4 and L5. Intervertebral spacer noted at L4-L5. Visualized vertebral body heights and marrow signal unremarkable. No evidence of an acute fracture.  # Alignment:There is a mild curvature to the LEFT.  # Conus medullaris/cauda equina: Spinal cord signal is normal and terminates at L1. The cauda equina is unremarkable.   # Lower thoracic spine: Disc desiccation and a small disc bulge at T11-T12 effacing the ventral thecal sac. Foramen are patent.   # T12-L1: Mild facet arthrosis. No significant disc herniation, spinal canal or neuroforaminal compromise.  # L1-L2: Disc desiccation with loss of disc height anda disc bulge. There is facet arthropathy. This results in mild spinal canal narrowing. Foramen are patent. This is unchanged.  # L2-L3: Facet arthrosis and ligamentum flavum redundancy. Small disc bulge. This results in moderate spinal canal narrowing and mild lateral recess narrowing. Mild RIGHT neuroforaminal stenosis. The LEFT neuroforamen is patent. This is unchanged.  # L3-L4: Disc desiccation with mild loss of disc height and a small disc bulge. There is a small central disc protrusion. Bilateral facet hypertrophy and ligamentum flavum redundancy appreciated. This results in mild spinal canal narrowing. Foramen are  patent. This is unchanged.  # L4-L5: Postsurgical changes. Spinal canal and foramen are patent. This is unchanged.  # L5-S1: Disc desiccation with a posterior disc protrusion that is more prominent on the LEFT. Posterior marginal osteophyte more prominent on the LEFT. There is facet arthropathy. Mild spinal canal narrowing and moderate LEFT lateral recess narrowing. Mild bilateral neuroforaminal stenosis, more moderate on the LEFT. This is unchanged.   # Paraspinal tissues: Unremarkable.   # Additional comments: None.    IMPRESSION:  1. No significant interval change.  2. Prior anterior and posterior fusion at L4-L5 without any significant spinal canal and neuroforaminal stenosis at this level.  3. Persistent degenerative disc disease and facet arthrosis as described above. Again this is most notable for moderate spinal canal narrowing at L2-L3; moderate LEFT lateral recess and neuroforaminal stenosis at L5-S1.   Electronically Signed by: Ardyth Man Specimen Collected: 09/24/20 11:47 AM     Objective:  VS:  HT:    WT:   BMI:     BP:110/82  HR:96bpm  TEMP: ( )  RESP:  Physical Exam Vitals and nursing note reviewed.  Constitutional:      General: She is not in acute distress.    Appearance: Normal appearance. She is obese. She is not ill-appearing.  HENT:     Head: Normocephalic and atraumatic.     Right Ear: External ear normal.  Left Ear: External ear normal.  Eyes:     Extraocular Movements: Extraocular movements intact.  Cardiovascular:     Rate and Rhythm: Normal rate.     Pulses: Normal pulses.  Pulmonary:     Effort: Pulmonary effort is normal. No respiratory distress.  Abdominal:     General: There is no distension.     Palpations: Abdomen is soft.  Musculoskeletal:        General: Tenderness present.     Cervical back: Neck supple.     Right lower leg: No edema.     Left lower leg: No edema.     Comments: Patient has good distal strength with no  pain over the greater trochanters.  No clonus or focal weakness.  Skin:    Findings: No erythema, lesion or rash.  Neurological:     General: No focal deficit present.     Mental Status: She is alert and oriented to person, place, and time.     Sensory: No sensory deficit.     Motor: No weakness or abnormal muscle tone.     Coordination: Coordination normal.  Psychiatric:        Mood and Affect: Mood normal.        Behavior: Behavior normal.      Imaging: No results found.

## 2020-12-25 NOTE — Procedures (Signed)
S1 Lumbosacral Transforaminal Epidural Steroid Injection - Sub-Pedicular Approach with Fluoroscopic Guidance   Patient: Hannah Lynch      Date of Birth: 1971-01-28 MRN: [id number] PCP: Eartha Inch, MD      Visit Date: 12/25/2020   Universal Protocol:    Date/Time: 02/15/224:37 PM  Consent Given By: the patient  Position:  PRONE  Additional Comments: Vital signs were monitored before and after the procedure. Patient was prepped and draped in the usual sterile fashion. The correct patient, procedure, and site was verified.   Injection Procedure Details:  Procedure Site One Meds Administered:  Meds ordered this encounter  Medications  . DISCONTD: dexamethasone (DECADRON) injection 15 mg    Laterality: Left  Location/Site:  S1 Foramen   Needle size: 22 ga.  Needle type: Spinal  Needle Placement: Transforaminal  Findings:   -Comments: Excellent flow of contrast along the nerve, nerve root and into the epidural space.  Epidurogram: Contrast epidurogram showed no nerve root cut off or restricted flow pattern.  Procedure Details: After squaring off the sacral end-plate to get a true AP view, the C-arm was positioned so that the best possible view of the S1 foramen was visualized. The soft tissues overlying this structure were infiltrated with 2-3 ml. of 1% Lidocaine without Epinephrine.    The spinal needle was inserted toward the target using a "trajectory" view along the fluoroscope beam.  Under AP and lateral visualization, the needle was advanced so it did not puncture dura. Biplanar projections were used to confirm position. Aspiration was confirmed to be negative for CSF and/or blood. A 1-2 ml. volume of Isovue-250 was injected and flow of contrast was noted at each level. Radiographs were obtained for documentation purposes.   After attaining the desired flow of contrast documented above, a 0.5 to 1.0 ml test dose of 0.25% Marcaine was injected into each  respective transforaminal space.  The patient was observed for 90 seconds post injection.  After no sensory deficits were reported, and normal lower extremity motor function was noted,   the above injectate was administered so that equal amounts of the injectate were placed at each foramen (level) into the transforaminal epidural space.   Additional Comments:  The patient tolerated the procedure well Dressing: Band-Aid with 2 x 2 sterile gauze    Post-procedure details: Patient was observed during the procedure. Post-procedure instructions were reviewed.  Patient left the clinic in stable condition.

## 2020-12-25 NOTE — Patient Instructions (Signed)

## 2020-12-25 NOTE — Progress Notes (Signed)
Numeric Pain Rating Scale and Functional Assessment Average Pain 7   In the last MONTH (on 0-10 scale) has pain interfered with the following?  1. General activity like being  able to carry out your everyday physical activities such as walking, climbing stairs, carrying groceries, or moving a chair?  Rating(10)   +Driver, -BT, -Dye Allergies.  Pain in Left hip and down the posterior thigh to the knee

## 2021-01-01 ENCOUNTER — Ambulatory Visit (INDEPENDENT_AMBULATORY_CARE_PROVIDER_SITE_OTHER): Payer: 59

## 2021-01-01 ENCOUNTER — Other Ambulatory Visit: Payer: Self-pay

## 2021-01-01 ENCOUNTER — Ambulatory Visit (INDEPENDENT_AMBULATORY_CARE_PROVIDER_SITE_OTHER): Payer: 59 | Admitting: Physical Medicine and Rehabilitation

## 2021-01-01 ENCOUNTER — Telehealth: Payer: Self-pay | Admitting: Physical Medicine and Rehabilitation

## 2021-01-01 ENCOUNTER — Encounter: Payer: Self-pay | Admitting: Physical Medicine and Rehabilitation

## 2021-01-01 VITALS — BP 119/82 | HR 77

## 2021-01-01 DIAGNOSIS — M961 Postlaminectomy syndrome, not elsewhere classified: Secondary | ICD-10-CM | POA: Diagnosis not present

## 2021-01-01 DIAGNOSIS — M7918 Myalgia, other site: Secondary | ICD-10-CM

## 2021-01-01 DIAGNOSIS — M542 Cervicalgia: Secondary | ICD-10-CM

## 2021-01-01 DIAGNOSIS — G4486 Cervicogenic headache: Secondary | ICD-10-CM | POA: Diagnosis not present

## 2021-01-01 DIAGNOSIS — R202 Paresthesia of skin: Secondary | ICD-10-CM

## 2021-01-01 NOTE — Progress Notes (Signed)
Headaches. "arms give out." Numbness and tingling in arms. History of 3 neck surgeries. Symptoms now are similar to symptoms prior to surgeries.  Numeric Pain Rating Scale and Functional Assessment Average Pain 7 Pain Right Now 4 My pain is constant, sharp and tingling Pain is worse with: some activites and certain wyas she moves her head or arms Pain improves with: nothing   In the last MONTH (on 0-10 scale) has pain interfered with the following?  1. General activity like being  able to carry out your everyday physical activities such as walking, climbing stairs, carrying groceries, or moving a chair?  Rating(6)  2. Relation with others like being able to carry out your usual social activities and roles such as  activities at home, at work and in your community. Rating(6)  3. Enjoyment of life such that you have  been bothered by emotional problems such as feeling anxious, depressed or irritable?  Rating(10)

## 2021-01-01 NOTE — Telephone Encounter (Signed)
Notes sent and PENDING

## 2021-01-01 NOTE — Telephone Encounter (Signed)
Needs authorization and scheduling for C7-T1 IL. No blood thinners. Can be scheduled as a 15 minute planned injection on a Thursday afternoon when authorized.

## 2021-01-01 NOTE — Progress Notes (Signed)
Hannah Lynch - 50 y.o. female MRN [id number]  Date of birth: Mar 30, 1971  Office Visit Note: Visit Date: 01/01/2021 PCP: Eartha Inch, MD Referred by: Eartha Inch, MD  Subjective: Chief Complaint  Patient presents with  . Neck - Pain   HPI: Hannah Lynch is a 50 y.o. female who comes in today  For evaluation management of chronic worsening severe neck pain with bilateral shoulder and arm pain with paresthesia and a feeling of weakness or tiredness.  Her case is very complicated with 2 prior cervical surgeries.  The first surgery was by Dr. Venetia Maxon at [facility] some years ago and this was an ACDF of C4-5 and C5-6.  Subsequently in July and August 2020 the patient began seeing Dr. Petra Kuba at [place].  She had updated MRI completed at that time showing problems at C3-4 but with continued C5-6 moderate stenosis.  These notes are fully reviewed and are reviewed in the chart.  Several pages of notes reviewed including notes by Dr. Laurian Brim who gave her an epidural injection at the time.  Per those reports the patient was having symptoms more in the upper shoulders but she did get some paresthesia in the arms right more than left but bilateral.  She had had physical therapy through Dr. Laurian Brim who is a pain physician anesthesiologist.  She had 75% improved symptoms with physical therapy and epidural injection but it was very short-lived.  She went on in September to have ACDF at C3-4 by Dr. Petra Kuba.  According to the notes and the patient she had good relief for about a year.  She reports current symptoms are very similar to what she was having at that point.  She reports despite having had physical therapy and multiple surgery she has not had dry needling or trigger point treatment or evaluation for that.  She does not know if she has had electrodiagnostic studies of the arms.  I originally saw her through Dr. Norlene Campbell in our office for more lumbar  spine issues.  She has had injection by me with decent relief.  She has not had updated imaging of the cervical spine since the surgery.  There is a fluoroscopic survey of the neck status post the surgery in 2020.  Cervical spine films were completed today and reviewed below.  She uses mainly over-the-counter medications and heat and ice to change of activity for pain relief.  She describes average pain is 7 out of 10 which is constant sharp and tingling worse with some activities.  She gets a lot of tiredness in the arms if she raises her arms and tries to use them for anything above the shoulder level.  The tingling numbness issue is really nondermatomal and global.  Her biggest complaint is pain around the neck itself and trapezius area.  She also endorses headache that is more of a frontotemporal headache bandlike headache.  No history of migraines.     Review of Systems  Musculoskeletal: Positive for joint pain and neck pain.  Neurological: Positive for tingling and weakness.  All other systems reviewed and are negative.  Otherwise per HPI.  Assessment & Plan: Visit Diagnoses:    ICD-10-CM   1. Myofascial pain syndrome  M79.18 Ambulatory referral to Physical Therapy  2. Cervicalgia  M54.2 XR Cervical Spine 2 or 3 views    Ambulatory referral to Physical Therapy    CANCELED: Ambulatory referral to Physical Therapy  3. Post laminectomy syndrome  M96.1 XR Cervical Spine 2 or 3 views    Ambulatory referral to Physical Therapy    CANCELED: Ambulatory referral to Physical Therapy  4. Cervicogenic headache  G44.86 XR Cervical Spine 2 or 3 views    Ambulatory referral to Physical Therapy    CANCELED: Ambulatory referral to Physical Therapy  5. Paresthesia of skin  R20.2      Plan: Findings:  1.  Neck pain with bilateral shoulder pain with some referral with nondermatomal global paresthesia type sensation.  She is status post 3 cervical surgeries with continued symptoms.  No history of  fibromyalgia but she does have lumbar issues as well with history of surgery and epidural injections there.  Epidural injection of the cervical spine in the past gave her temporary relief prior to the last surgery in 2020.  MRIs from that time do show continued C5-6 moderate stenosis and this was not addressed with the more current surgery.  I think her pain is multifactorial I think part of it based on exam is myofascial pain with trigger points that are obviously reproducing some of her pain.  These may be reproducing her headache as well as shown below.  I think the first step is cervical epidural injection to see how much relief she gets diagnostically.  This would be a C7-T1 interlaminar epidural steroid injection with fluoroscopic guidance.  Injections are done in a comprehensive approach with access to biopsychosocial counseling if needed and physical therapy program as noted.  Depending on that would consider MRI of the cervical spine.  She can also follow-up with Dr. Petra Kuba who performed the surgery.    In the same regard I want to get her started with physical therapy here just for a short course of manual treatment and dry needling and trigger point treatment.  I think both issues are ongoing.  We had a long discussion today about myofascial pain syndrome and trigger points and fibromyalgia.  I do not get a sense from everything that fibromyalgia is involved but we cannot rule that out at this point.  2.  Shoulder and arm weakness or not nerve related based on exam I think this may be due to fatigue from musculature and myofascial pain syndrome.  See above for treatment plan.  3.  Headaches seem to be tension type headaches and can be cervicogenic from trigger points.  I try to reassure her that is not directly related to her cervical spine prior surgery although it can be indirectly related.  Depending on relief with treatment may consider referral to Dr. Naomie Dean for headache  management.    Meds & Orders: No orders of the defined types were placed in this encounter.   Orders Placed This Encounter  Procedures  . XR Cervical Spine 2 or 3 views  . Ambulatory referral to Physical Therapy    Follow-up: Return for Cervical epidural.   Procedures: No procedures performed      Clinical History: Acute Interface, Incoming Rad Results - 08/03/2019 1:52 PM EDT  INDICATION: Spinal fusion   COMPARISON: 05/20/2019   TECHNIQUE: Fluoroscopy time 0.8 seconds. 4 spot films.   FINDINGS: C3-C4 is initially localized anteriorly and subsequent C3-C4 fusionperformed. Prior C4-C6 fusion noted.    IMPRESSION: C3-C4 spinal fusion.   Electronically Signed by: Bryna Colander ----- 06/02/2019 MRI Cspine IMPRESSION:  1. Prior ACDF C4-C5 and C5-C6.  2. Circumferential bulges at C3-C4 and C6-C7. 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 (Note that, by my measurements, in retrospect, there was stenosis at these levels on the study of 09/21/2017, and not significantly changed on today's study.)  3. Mild to moderate foraminal stenosisin the upper cervical spine, from uncovertebral spurs..   Electronically Signed by: Neal Dy   STUDY: MRI of the lumbar spine without intravenous contrast performed on 09/24/2020 10:14 AM.   COMPARISON: 03/30/2019.   FINDINGS:  # Osseous structures: Prior posterior decompression with hardware fixation that includes bilateral pedicle screws at L4 and L5. Intervertebral spacer noted at L4-L5. Visualized vertebral body heights and marrow signal unremarkable. No evidence of an acute fracture.  # Alignment:There is a mild curvature to the LEFT.  # Conus medullaris/cauda equina: Spinal cord signal is normal and terminates at L1. The cauda equina is unremarkable.   #  Lower thoracic spine: Disc desiccation and a small disc bulge at T11-T12 effacing the ventral thecal sac. Foramen are patent.   # T12-L1: Mild facet arthrosis. No significant disc herniation, spinal canal or neuroforaminal compromise.  # L1-L2: Disc desiccation with loss of disc height anda disc bulge. There is facet arthropathy. This results in mild spinal canal narrowing. Foramen are patent. This is unchanged.  # L2-L3: Facet arthrosis and ligamentum flavum redundancy. Small disc bulge. This results in moderate spinal canal narrowing and mild lateral recess narrowing. Mild RIGHT neuroforaminal stenosis. The LEFT neuroforamen is patent. This is unchanged.  # L3-L4: Disc desiccation with mild loss of disc height and a small disc bulge. There is a small central disc protrusion. Bilateral facet hypertrophy and ligamentum flavum redundancy appreciated. This results in mild spinal canal narrowing. Foramen are patent. This is unchanged.  # L4-L5: Postsurgical changes. Spinal canal and foramen are patent. This is unchanged.  # L5-S1: Disc desiccation with a posterior disc protrusion that is more prominent on the LEFT. Posterior marginal osteophyte more prominent on the LEFT. There is facet arthropathy. Mild spinal canal narrowing and moderate LEFT lateral recess narrowing. Mild bilateral neuroforaminal stenosis, more moderate on the LEFT. This is unchanged.   # Paraspinal tissues: Unremarkable.   # Additional comments: None.    IMPRESSION:  1. No significant interval change.  2. Prior anterior and posterior fusion at L4-L5 without any significant spinal canal and neuroforaminal stenosis at this level.  3. Persistent degenerative disc disease and facet arthrosis as described above. Again this is most notable for moderate spinal canal narrowing at L2-L3; moderate LEFT lateral recess and neuroforaminal stenosis at L5-S1.   Electronically Signed by: Ardyth Man Specimen Collected: 09/24/20  11:47 AM   She reports that she has never smoked. She has never used smokeless tobacco. No results for input(s): HGBA1C, LABURIC in the last 8760 hours.  Objective:  VS:  HT:    WT:   BMI:     BP:119/82  HR:77bpm  TEMP: ( )  RESP:  Physical Exam Vitals and nursing note reviewed.  Constitutional:      General: She is not in acute distress.    Appearance: Normal appearance. She is obese. She is not ill-appearing.  HENT:     Head: Normocephalic and atraumatic.     Right Ear: External ear normal.     Left Ear: External ear normal.  Eyes:     Extraocular Movements: Extraocular movements intact.  Cardiovascular:     Rate and Rhythm: Normal rate.  Pulses: Normal pulses.  Musculoskeletal:     Cervical back: Tenderness present. No rigidity.     Right lower leg: No edema.     Left lower leg: No edema.     Comments: Patient has good strength in the upper extremities including 5 out of 5 strength in wrist extension long finger flexion and APB.  There is no atrophy of the hands intrinsically.  There is a negative Hoffmann's test.  She has intact sensation to light touch in all peripheral nerve and dermatomal distributions.  She has normal reflexes.  She has focal trigger points in the levator scapula trapezius and rhomboid very consistent with her pain pattern to some degree.  She has mild shoulder impingement on the left compared to the right.   Lymphadenopathy:     Cervical: No cervical adenopathy.  Skin:    Findings: No erythema, lesion or rash.  Neurological:     General: No focal deficit present.     Mental Status: She is alert and oriented to person, place, and time.     Sensory: No sensory deficit.     Motor: No weakness or abnormal muscle tone.     Coordination: Coordination normal.  Psychiatric:        Mood and Affect: Mood normal.        Behavior: Behavior normal.     Ortho Exam  Imaging: XR Cervical Spine 2 or 3 views  Result Date: 01/02/2021 AP and lateral  cervical spine shows prior ACDF at C4-5 and C5-6 and with some type of interbody fusion at C3-4.  There is good lordosis without listhesis.  There is degenerative disc changes with anterior osteophytes at C6-7 at adjacent level.   Past Medical/Family/Surgical/Social History: Medications & Allergies reviewed per EMR, new medications updated. Patient Active Problem List   Diagnosis Date Noted  . Trochanteric bursitis, left hip 08/30/2020  . 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 Pain in left knee 10/12/2019  . Obesity (BMI 30-39.9) 09/27/2019  . GERD (gastroesophageal reflux disease) 09/27/2019  . Essential hypertension 09/27/2019  . Fatty liver 09/27/2019  . Metabolic syndrome 09/27/2019  . Pain in right ankle and joints of right foot 04/13/2019  . Chronic cholecystitis   . Primary insomnia 06/25/2017  . Papanicolaou smear for cervical cancer screening 06/25/2017  . Bilateral hearing loss 06/25/2017  . Acute serous otitis media 06/25/2017  . Lumbar stenosis with neurogenic claudication 09/25/2014   Past Medical History:  Diagnosis Date  . Anxiety   . Degenerative disc disease, lumbar   . Degenerative disc disease, lumbar 2012   lumbar and cervical region  . Depression   . Family history of adverse reaction to anesthesia    pt's mother has hx. of post-op N/V  . Headache   . Hypothyroidism   . IBS (irritable bowel syndrome)   . Nasal septal deviation 11/2017  . Nasal turbinate hypertrophy 11/2017  . PONV (postoperative nausea and vomiting)   . Snoring 11/2017   Family History  Problem Relation Age of Onset  . Hypertension Mother   . Diabetes Mother   . Heart failure Father   . Hypertension Father   . Diabetes Father   . Heart failure Other   . Breast cancer Paternal Grandmother    Past Surgical History:  Procedure Laterality Date  . ABDOMINAL HYSTERECTOMY  2005   partial  . ANTERIOR CERVICAL DECOMP/DISCECTOMY FUSION  03/25/2007   C4-5   . CERVICAL FUSION  2013  . CHOLECYSTECTOMY N/A 03/26/2018   Procedure: LAPAROSCOPIC CHOLECYSTECTOMY;  Surgeon: Franky MachoJenkins, Mark, MD;  Location: [facility];  Service: General;  Laterality: N/A;  . KNEE ARTHROSCOPY Right   . LAPAROSCOPIC GASTRIC SLEEVE RESECTION N/A 09/27/2019   Procedure: LAPAROSCOPIC GASTRIC SLEEVE RESECTION, HIATAL HERNIA REPARI ;Upper Endo, Eras Pathway;  Surgeon: Gaynelle AduWilson, Eric, MD;  Location: [facility];  Service: General;  Laterality: N/A;  . LUMBAR LAMINECTOMY/DECOMPRESSION MICRODISCECTOMY  11/21/2004   L4-5  . MAXIMUM ACCESS (MAS)POSTERIOR LUMBAR INTERBODY FUSION (PLIF) 1 LEVEL N/A 09/25/2014   Procedure: Lumbar four-five MAXIMUM ACCESS (MAS) POSTERIOR LUMBAR INTERBODY FUSION (PLIF) 1 LEVEL;  Surgeon: Maeola HarmanJoseph Stern, MD;  Location: [facility];  Service: Neurosurgery;  Laterality: N/A;  . NASAL SEPTOPLASTY W/ TURBINOPLASTY N/A 12/07/2017   Procedure: NASAL SEPTOPLASTY WITH BILATERAL TURBINATE REDUCTION;  Surgeon: Christia ReadingBates, Dwight, MD;  Location:  SURGERY CENTER;  Service: ENT;  Laterality: N/A;  . ORIF ANKLE FRACTURE Right   . TONSILLECTOMY  06/2018  . UPPER GI ENDOSCOPY     Social History   Occupational History  . Not on file  Tobacco Use  . Smoking status: Never Smoker  . Smokeless tobacco: Never Used  Vaping Use  . Vaping Use: Never used  Substance and Sexual Activity  . Alcohol use: No  . Drug use: No  . Sexual activity: Yes    Birth control/protection: None

## 2021-01-03 NOTE — Telephone Encounter (Signed)
Pt insurance still PENDING. 

## 2021-01-07 ENCOUNTER — Telehealth: Payer: Self-pay | Admitting: Physical Medicine and Rehabilitation

## 2021-01-07 NOTE — Telephone Encounter (Signed)
Pt insurance was denied, pt paperwork on your desk.

## 2021-01-07 NOTE — Telephone Encounter (Signed)
Called pt and LVM #1 

## 2021-01-07 NOTE — Telephone Encounter (Signed)
Let her know it was denied due to not having had recent PT - I sent in referral to PT at our last OV. After she does some PT we can OV and try to get injection approval then.

## 2021-01-07 NOTE — Telephone Encounter (Signed)
Patient returned Shena's call. Please call patient at [phone].

## 2021-01-09 ENCOUNTER — Other Ambulatory Visit: Payer: Self-pay | Admitting: Physical Medicine and Rehabilitation

## 2021-01-09 DIAGNOSIS — M542 Cervicalgia: Secondary | ICD-10-CM

## 2021-01-09 DIAGNOSIS — M7918 Myalgia, other site: Secondary | ICD-10-CM

## 2021-01-09 NOTE — Telephone Encounter (Signed)
Called pt and LVM #2 

## 2021-01-09 NOTE — Progress Notes (Signed)
See prior notes. Specifically want PT for myofascial release and dry needling Levator, trap, infraspinatus

## 2021-01-10 ENCOUNTER — Ambulatory Visit: Payer: 59 | Admitting: Orthopaedic Surgery

## 2021-01-15 ENCOUNTER — Ambulatory Visit: Payer: 59 | Admitting: Orthopaedic Surgery

## 2021-01-22 ENCOUNTER — Ambulatory Visit: Payer: 59 | Admitting: Rehabilitative and Restorative Service Providers"

## 2021-01-22 ENCOUNTER — Encounter: Payer: Self-pay | Admitting: Rehabilitative and Restorative Service Providers"

## 2021-01-22 ENCOUNTER — Encounter: Payer: Self-pay | Admitting: Orthopaedic Surgery

## 2021-01-22 ENCOUNTER — Ambulatory Visit (INDEPENDENT_AMBULATORY_CARE_PROVIDER_SITE_OTHER): Payer: 59 | Admitting: Orthopaedic Surgery

## 2021-01-22 ENCOUNTER — Other Ambulatory Visit: Payer: Self-pay

## 2021-01-22 VITALS — Ht 64.0 in | Wt 198.0 lb

## 2021-01-22 DIAGNOSIS — M542 Cervicalgia: Secondary | ICD-10-CM | POA: Diagnosis not present

## 2021-01-22 DIAGNOSIS — M25562 Pain in left knee: Secondary | ICD-10-CM | POA: Diagnosis not present

## 2021-01-22 DIAGNOSIS — M79601 Pain in right arm: Secondary | ICD-10-CM

## 2021-01-22 DIAGNOSIS — M79602 Pain in left arm: Secondary | ICD-10-CM

## 2021-01-22 DIAGNOSIS — G8929 Other chronic pain: Secondary | ICD-10-CM | POA: Diagnosis not present

## 2021-01-22 DIAGNOSIS — R293 Abnormal posture: Secondary | ICD-10-CM

## 2021-01-22 DIAGNOSIS — M6281 Muscle weakness (generalized): Secondary | ICD-10-CM

## 2021-01-22 NOTE — Progress Notes (Signed)
Office Visit Note   Patient: Hannah Lynch           Date of Birth: 1971-08-25           MRN: [id number] Visit Date: 01/22/2021              Requested by: Eartha Inch, MD [address] [place],  [place] [address] PCP: Eartha Inch, MD   Assessment & Plan: Visit Diagnoses:  1. Chronic pain of left knee     Plan: Recurrent pain left knee probably related to chronic osteoarthritis.  Ms. Hypes has had 2 prior knee arthroscopies the last of which was in December 2020 with a tear of the lateral meniscus.  She had an exacerbation of her left knee pain last year when she fell.  There were no acute changes she notes not having much of a response to cortisone.  She is having pain along the medial lateral compartments.  I truly believe her problem is related to the arthritis.  We talked about exercises and weight loss.  I will repeat the MRI scan to be sure this no other abnormality  Follow-Up Instructions: No follow-ups on file.   Orders:  No orders of the defined types were placed in this encounter.  No orders of the defined types were placed in this encounter.     Procedures: No procedures performed   Clinical Data: No additional findings.   Subjective: Chief Complaint  Patient presents with  . Left Knee - Follow-up  Patient presents today for her left knee. She was here two months ago and received a cortisone injection. She states that the injection did not help. She feels like she is worse since her last visit. She is taking toradol as needed.   HPI  Review of Systems   Objective: Vital Signs: Ht 5\' 4"  (1.626 m)   Wt 198 lb (89.8 kg)   BMI 33.99 kg/m   Physical Exam Constitutional:      Appearance: She is well-developed.  Eyes:     Pupils: Pupils are equal, round, and reactive to light.  Pulmonary:     Effort: Pulmonary effort is normal.  Skin:    General: Skin is warm and dry.  Neurological:     Mental Status: She is alert and oriented to  person, place, and time.  Psychiatric:        Behavior: Behavior normal.     Ortho Exam awake alert and oriented x3.  Comfortable sitting no effusion left knee.  Having pain beneath the patella as well as medial lateral compartment.  No instability.  Full quick extension and slight hyperextension flexed over 100 degrees.  No popliteal pain.  No calf pain.  No pain with range of motion of her hip.  Straight leg raise negative Specialty Comments:  No specialty comments available.  Imaging: No results found.   PMFS History: Patient Active Problem List   Diagnosis Date Noted  . Trochanteric bursitis, left hip 08/30/2020  . 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 Pain in left knee 10/12/2019  . Obesity (BMI 30-39.9) 09/27/2019  . GERD (gastroesophageal reflux disease) 09/27/2019  . Essential hypertension 09/27/2019  . Fatty liver 09/27/2019  . Metabolic syndrome 09/27/2019  . Pain in right ankle and joints of right foot 04/13/2019  . Chronic cholecystitis   . Primary insomnia 06/25/2017  . Papanicolaou smear for cervical cancer screening 06/25/2017  . Bilateral hearing loss 06/25/2017  .  Acute serous otitis media 06/25/2017  . Lumbar stenosis with neurogenic claudication 09/25/2014   Past Medical History:  Diagnosis Date  . Anxiety   . Degenerative disc disease, lumbar   . Degenerative disc disease, lumbar 2012   lumbar and cervical region  . Depression   . Family history of adverse reaction to anesthesia    pt's mother has hx. of post-op N/V  . Headache   . Hypothyroidism   . IBS (irritable bowel syndrome)   . Nasal septal deviation 11/2017  . Nasal turbinate hypertrophy 11/2017  . PONV (postoperative nausea and vomiting)   . Snoring 11/2017    Family History  Problem Relation Age of Onset  . Hypertension Mother   . Diabetes Mother   . Heart failure Father   . Hypertension Father   . Diabetes Father   . Heart failure Other   . Breast  cancer Paternal Grandmother     Past Surgical History:  Procedure Laterality Date  . ABDOMINAL HYSTERECTOMY  2005   partial  . ANTERIOR CERVICAL DECOMP/DISCECTOMY FUSION  03/25/2007   C4-5  . CERVICAL FUSION  2013  . CHOLECYSTECTOMY N/A 03/26/2018   Procedure: LAPAROSCOPIC CHOLECYSTECTOMY;  Surgeon: Franky Macho, MD;  Location: [facility];  Service: General;  Laterality: N/A;  . KNEE ARTHROSCOPY Right   . LAPAROSCOPIC GASTRIC SLEEVE RESECTION N/A 09/27/2019   Procedure: LAPAROSCOPIC GASTRIC SLEEVE RESECTION, HIATAL HERNIA REPARI ;Upper Endo, Eras Pathway;  Surgeon: Gaynelle Adu, MD;  Location: [facility];  Service: General;  Laterality: N/A;  . LUMBAR LAMINECTOMY/DECOMPRESSION MICRODISCECTOMY  11/21/2004   L4-5  . MAXIMUM ACCESS (MAS)POSTERIOR LUMBAR INTERBODY FUSION (PLIF) 1 LEVEL N/A 09/25/2014   Procedure: Lumbar four-five MAXIMUM ACCESS (MAS) POSTERIOR LUMBAR INTERBODY FUSION (PLIF) 1 LEVEL;  Surgeon: Maeola Harman, MD;  Location: [facility];  Service: Neurosurgery;  Laterality: N/A;  . NASAL SEPTOPLASTY W/ TURBINOPLASTY N/A 12/07/2017   Procedure: NASAL SEPTOPLASTY WITH BILATERAL TURBINATE REDUCTION;  Surgeon: Christia Reading, MD;  Location: [facility];  Service: ENT;  Laterality: N/A;  . ORIF ANKLE FRACTURE Right   . TONSILLECTOMY  06/2018  . UPPER GI ENDOSCOPY     Social History   Occupational History  . Not on file  Tobacco Use  . Smoking status: Never Smoker  . Smokeless tobacco: Never Used  Vaping Use  . Vaping Use: Never used  Substance and Sexual Activity  . Alcohol use: No  . Drug use: No  . Sexual activity: Yes    Birth control/protection: None

## 2021-01-22 NOTE — Addendum Note (Signed)
Addended by: Wendi Maya on: 01/22/2021 02:22 PM   Modules accepted: Orders

## 2021-01-22 NOTE — Therapy (Signed)
[facility] Physical Therapy [address] [place], [place], [address] Phone: [phone]   Fax:  [phone]  Physical Therapy Evaluation  Patient Details  Name: Hannah Lynch MRN: [id number] Date of Birth: 03-22-1971 Referring Provider (PT): Dr. Alvester Morin   Encounter Date: 01/22/2021   PT End of Session - 01/22/21 1237    Visit Number 1    Number of Visits 12    Date for PT Re-Evaluation 03/19/21    PT Start Time 1300    PT Stop Time 1335    PT Time Calculation (min) 35 min    Activity Tolerance Patient tolerated treatment well    Behavior During Therapy [facility] for tasks assessed/performed           Past Medical History:  Diagnosis Date  . Anxiety   . Degenerative disc disease, lumbar   . Degenerative disc disease, lumbar 2012   lumbar and cervical region  . Depression   . Family history of adverse reaction to anesthesia    pt's mother has hx. of post-op N/V  . Headache   . Hypothyroidism   . IBS (irritable bowel syndrome)   . Nasal septal deviation 11/2017  . Nasal turbinate hypertrophy 11/2017  . PONV (postoperative nausea and vomiting)   . Snoring 11/2017    Past Surgical History:  Procedure Laterality Date  . ABDOMINAL HYSTERECTOMY  2005   partial  . ANTERIOR CERVICAL DECOMP/DISCECTOMY FUSION  03/25/2007   C4-5  . CERVICAL FUSION  2013  . CHOLECYSTECTOMY N/A 03/26/2018   Procedure: LAPAROSCOPIC CHOLECYSTECTOMY;  Surgeon: Franky Macho, MD;  Location: [facility];  Service: General;  Laterality: N/A;  . KNEE ARTHROSCOPY Right   . LAPAROSCOPIC GASTRIC SLEEVE RESECTION N/A 09/27/2019   Procedure: LAPAROSCOPIC GASTRIC SLEEVE RESECTION, HIATAL HERNIA REPARI ;Upper Endo, Eras Pathway;  Surgeon: Gaynelle Adu, MD;  Location: [facility];  Service: General;  Laterality: N/A;  . LUMBAR LAMINECTOMY/DECOMPRESSION MICRODISCECTOMY  11/21/2004   L4-5  . MAXIMUM ACCESS (MAS)POSTERIOR LUMBAR INTERBODY FUSION (PLIF) 1 LEVEL N/A 09/25/2014   Procedure: Lumbar  four-five MAXIMUM ACCESS (MAS) POSTERIOR LUMBAR INTERBODY FUSION (PLIF) 1 LEVEL;  Surgeon: Maeola Harman, MD;  [facility];  Service: Neurosurgery;  Laterality: N/A;  . NASAL SEPTOPLASTY W/ TURBINOPLASTY N/A 12/07/2017   Procedure: NASAL SEPTOPLASTY WITH BILATERAL TURBINATE REDUCTION;  Surgeon: Christia Reading, MD;  Location: [facility];  Service: ENT;  Laterality: N/A;  . ORIF ANKLE FRACTURE Right   . TONSILLECTOMY  06/2018  . UPPER GI ENDOSCOPY      There were no vitals filed for this visit.    Subjective Assessment - 01/22/21 1247    Subjective Pt. stated having complaints of neck pain and also feeling complaints into arms. Numbness and tingling in both hands in glove like fashion up to elbow. More constant symptoms more recently in last 1-2 months.    Pertinent History Cervical pain, bilateral shoulder pain c paresthesia.  Chronic, 2 previous cervical surgery ACDF C4-C5, C5-C6.  C3-C4.  History of physical therapy.     IBS, hypothyroidism, depression, DDD lumbar. Trouble sleeping indicated, maybe 4 hours usually.    Limitations Lifting;Other (comment)   stiffness after sitting   Patient Stated Goals Reduce pain    Currently in Pain? Yes    Pain Score 2    pain at worst8/10   Pain Location Neck    Pain Orientation Left;Right    Pain Descriptors / Indicators Tingling;Numbness;Sharp    Pain Type Chronic pain  Pain Radiating Towards bilateral shoulder, UE    Pain Onset More than a month ago    Pain Frequency Constant    Aggravating Factors  working (lift/carry), tightness c movement in neck/head, long distance driving/riding    Pain Relieving Factors keeping arms close to avoid dropping items.    Effect of Pain on Daily Activities LIfting/carrying, sleeping difficulty.              [facility] PT Assessment - 01/22/21 0001      Assessment   Medical Diagnosis Cervical pain, myofascial syndrome    Referring Provider (PT) Dr. Alvester MorinNewton    Onset Date/Surgical Date  11/24/20    Hand Dominance Right      Precautions   Precaution Comments history of fusions from C3 to C6      Restrictions   Weight Bearing Restrictions No      Balance Screen   Has the patient fallen in the past 6 months Yes    How many times? 1    Has the patient had a decrease in activity level because of a fear of falling?  No    Is the patient reluctant to leave their home because of a fear of falling?  No      Home [employer] manag[employer]   Living Environment Private residence      Prior Function   Level of Independence Independent    Vocation Requirements Part time Conservation officer, naturecashier, stock at times.  Unsure of weight lift requirement.    Leisure crafting, walking      Observation/Other Assessments   Focus on Therapeutic Outcomes (FOTO)  intake 42%, outcome expected 54%      Sensation   Light Touch Impaired Detail    Light Touch Impaired Details Impaired RUE   C5, C6, C7 light touch diminished in dermatome check     Posture/Postural Control   Posture/Postural Control Postural limitations    Postural Limitations Rounded Shoulders;Forward head   reduced cervical lordosis     ROM / Strength   AROM / PROM / Strength Strength;PROM;AROM      AROM   Overall AROM Comments WFL elevation bilateral UE    AROM Assessment Site Cervical;Shoulder    Right/Left Shoulder Left;Right    Cervical Extension 35 with no change    Cervical - Right Side Bend 35 deg inferior cervical central pain    Cervical - Right Rotation 50 c Lt arm increase    Cervical - Left Rotation 40 c Rt arm increase      Strength   Overall Strength Comments Myotome check equal bilateral    Strength Assessment Site Shoulder;Elbow    Right/Left Shoulder Right;Left    Right Shoulder Flexion 4+/5    Right Shoulder ABduction 5/5    Right Shoulder Internal Rotation 5/5    Right Shoulder External Rotation 5/5    Left Shoulder Flexion 4+/5    Left Shoulder ABduction 5/5    Left Shoulder Internal Rotation 5/5    Left Shoulder  External Rotation 5/5    Right/Left Elbow Right;Left    Right Elbow Flexion 5/5    Right Elbow Extension 5/5    Left Elbow Flexion 5/5    Left Elbow Extension 5/5      Palpation   Palpation comment TrP c concordant symptoms in bilateral upper trap, levator scap, cervical paraspinals, infraspinatus                      Objective measurements completed on examination:  See above findings.       [facility] Adult PT Treatment/Exercise - 01/22/21 0001      Self-Care   Self-Care Other Self-Care Comments    Other Self-Care Comments  After manual intervention post care education including stretching/HEP use, heat/ice.  Handout provided.      Exercises   Exercises Other Exercises;Neck    Other Exercises  HEP review c cues for techniques, visual demonstration, handout provided.  Trial set of each exercise : supine cervical deep neck flexor hold 5sec, seated retraction 5 sec hold, cross arm stretch 15 sec, upper trap stretch 15 sec      Manual Therapy   Manual therapy comments compression to trigger points Rt infraspinatus            Trigger Point Dry Needling - 01/22/21 0001    Consent Given? Yes    Education Handout Provided Yes    Muscles Treated Upper Quadrant Infraspinatus   Rt   Infraspinatus Response Twitch response elicited                PT Education - 01/22/21 1237    Education Details HEP, POC , DN    Person(s) Educated Patient    Methods Explanation;Demonstration;Verbal cues;Handout    Comprehension Returned demonstration;Verbalized understanding            PT Short Term Goals - 01/22/21 1237      PT SHORT TERM GOAL #1   Title Patient will demonstrate independent use of home exercise program to maintain progress from in clinic treatments.    Time 3    Period Weeks    Status New    Target Date 02/12/21             PT Long Term Goals - 01/22/21 1237      PT LONG TERM GOAL #1   Title Patient will demonstrate/report pain at worst less than  or equal to 2/10 to facilitate minimal limitation in daily activity secondary to pain symptoms.    Time 8    Period Weeks    Status New    Target Date 03/19/21      PT LONG TERM GOAL #2   Title Patient will demonstrate independent use of home exercise program to facilitate ability to maintain/progress functional gains from skilled physical therapy services.    Time 8    Period Weeks    Status New    Target Date 03/19/21      PT LONG TERM GOAL #3   Title Pt. will demonstrate cervical AROM WFL s symptoms increase to facilitate daily movement at PLOF.    Time 8    Period Weeks    Status New    Target Date 03/19/21      PT LONG TERM GOAL #4   Title FOTO outcome > = 54% to indicated reduced disability due to condition.    Time 8    Period Weeks    Status New    Target Date 03/19/21      PT LONG TERM GOAL #5   Title Pt. will demonstrate/report ability to sleep, perform work at [employer] s limitation.    Time 8    Period Weeks    Status New    Target Date 03/19/21      Additional Long Term Goals   Additional Long Term Goals Yes      PT LONG TERM GOAL #6   Title Pt. will demonstrate UE MMT 5/5 throughout s symptoms for lift/carry.  Time 8    Period Weeks    Status New    Target Date 03/19/21                  Plan - 01/22/21 1238    Clinical Impression Statement Patient is a 50 y.o. female who comes to clinic with complaints of cervical pain with mobility, strength and movement coordination deficits that impair their ability to perform usual daily and recreational functional activities without increase difficulty/symptoms at this time.  Patient to benefit from skilled PT services to address impairments and limitations to improve to previous level of function without restriction secondary to condition.    Personal Factors and Comorbidities Past/Current Experience;Comorbidity 3+    Comorbidities IBS, hypothyroidism, depression, DDD lumbar, multiple cervical fusion surgery     Examination-Activity Limitations Sleep;Bend;Carry;Lift;Sit    Examination-Participation Restrictions Community Activity;Driving;Occupation;Yard Work    Conservation officer, historic buildings Evolving/Moderate complexity    Clinical Decision Making Moderate    Rehab Potential --   fair to good   PT Frequency --   1-2x/week   PT Duration 8 weeks    PT Treatment/Interventions ADLs/Self Care Home Management;Cryotherapy;Electrical Stimulation;Therapeutic activities;Iontophoresis 4mg /ml Dexamethasone;Moist Heat;Balance training;Therapeutic exercise;Functional mobility training;DME Instruction;Ultrasound;Neuromuscular re-education;Patient/family education;Passive range of motion;Dry needling;Taping;Manual techniques    PT Next Visit Plan Review existing HEP, DN, manual for upper trap, levator scap, infraspinatus bilateral.  Postural strengthening/endurance.    PT Home Exercise Plan [facility]    Consulted and Agree with Plan of Care Patient           Patient will benefit from skilled therapeutic intervention in order to improve the following deficits and impairments:  Pain,Increased fascial restricitons,Increased muscle spasms,Decreased strength,Decreased activity tolerance,Improper body mechanics,Impaired perceived functional ability,Impaired flexibility,Postural dysfunction,Decreased range of motion,Hypomobility,Decreased endurance,Decreased mobility  Visit Diagnosis: Cervicalgia  Pain in left arm  Pain in right arm  Abnormal posture  Muscle weakness (generalized)     Problem List Patient Active Problem List   Diagnosis Date Noted  . Trochanteric bursitis, left hip 08/30/2020  . 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 Pain in left knee 10/12/2019  . Obesity (BMI 30-39.9) 09/27/2019  . GERD (gastroesophageal reflux disease) 09/27/2019  . Essential hypertension 09/27/2019  . Fatty liver 09/27/2019  . Metabolic syndrome 09/27/2019  . Pain in right ankle  and joints of right foot 04/13/2019  . Chronic cholecystitis   . Primary insomnia 06/25/2017  . Papanicolaou smear for cervical cancer screening 06/25/2017  . Bilateral hearing loss 06/25/2017  . Acute serous otitis media 06/25/2017  . Lumbar stenosis with neurogenic claudication 09/25/2014    09/27/2014, PT, DPT, OCS, ATC 01/22/21  1:55 PM    [facility] Physical Therapy [address] [place], [place], [place] Phone: [phone]   Fax:  [phone]  Name: Hannah Lynch MRN: Guido Sander Date of Birth: [birth year]

## 2021-01-22 NOTE — Patient Instructions (Signed)
Access Code: [facility] URL: [email or web address] Date: 01/22/2021 Prepared by: Chyrel Masson  Exercises Standing Shoulder Posterior Capsule Stretch - 2 x daily - 7 x weekly - 1 sets - 5 reps - 15-30 hold Seated Upper Trapezius Stretch - 2 x daily - 7 x weekly - 1 sets - 5 reps - 15 hold Seated Scapular Retraction - 2 x daily - 7 x weekly - 2 sets - 10 reps - 5 hold Supine Deep Neck Flexor Training - 2 x daily - 7 x weekly - 1 sets - 10 reps - 5 hold

## 2021-01-23 ENCOUNTER — Other Ambulatory Visit: Payer: Self-pay | Admitting: Family Medicine

## 2021-01-23 DIAGNOSIS — Z1231 Encounter for screening mammogram for malignant neoplasm of breast: Secondary | ICD-10-CM

## 2021-01-25 ENCOUNTER — Encounter: Payer: Self-pay | Admitting: Physical Medicine and Rehabilitation

## 2021-01-29 ENCOUNTER — Ambulatory Visit: Payer: 59 | Admitting: Rehabilitative and Restorative Service Providers"

## 2021-01-29 ENCOUNTER — Other Ambulatory Visit: Payer: Self-pay

## 2021-01-29 ENCOUNTER — Encounter: Payer: Self-pay | Admitting: Rehabilitative and Restorative Service Providers"

## 2021-01-29 DIAGNOSIS — M6281 Muscle weakness (generalized): Secondary | ICD-10-CM

## 2021-01-29 DIAGNOSIS — M79602 Pain in left arm: Secondary | ICD-10-CM | POA: Diagnosis not present

## 2021-01-29 DIAGNOSIS — M79601 Pain in right arm: Secondary | ICD-10-CM | POA: Diagnosis not present

## 2021-01-29 DIAGNOSIS — R293 Abnormal posture: Secondary | ICD-10-CM

## 2021-01-29 DIAGNOSIS — M542 Cervicalgia: Secondary | ICD-10-CM

## 2021-01-29 NOTE — Patient Instructions (Signed)
Access Code: [facility] URL: [email or web address] Date: 01/29/2021 Prepared by: Chyrel Masson  Exercises Standing Shoulder Posterior Capsule Stretch - 2 x daily - 7 x weekly - 1 sets - 5 reps - 15-30 hold Seated Upper Trapezius Stretch - 2 x daily - 7 x weekly - 1 sets - 5 reps - 15 hold Seated Scapular Retraction - 2 x daily - 7 x weekly - 2 sets - 10 reps - 5 hold Supine Deep Neck Flexor Training - 2 x daily - 7 x weekly - 1 sets - 10 reps - 5 hold Standing Shoulder Row with Anchored Resistance - 2 x daily - 7 x weekly - 10 reps - 3 sets Shoulder Extension with Resistance - 2 x daily - 7 x weekly - 10 reps - 3 sets Shoulder External Rotation with Anchored Resistance - 2 x daily - 7 x weekly - 10 reps - 3 sets

## 2021-01-29 NOTE — Therapy (Signed)
[facility] Physical Therapy [address] [place], [place], [address] Phone: [phone]   Fax:  [phone]  Physical Therapy Treatment  Patient Details  Name: Hannah Lynch MRN: [id number] Date of Birth: 07/16/1971 Referring Provider (PT): Dr. Alvester Morin   Encounter Date: 01/29/2021   PT End of Session - 01/29/21 1342    Visit Number 2    Number of Visits 12    Date for PT Re-Evaluation 03/19/21    PT Start Time 1336    PT Stop Time 1415    PT Time Calculation (min) 39 min    Activity Tolerance Patient tolerated treatment well    Behavior During Therapy [facility] for tasks assessed/performed           Past Medical History:  Diagnosis Date  . Anxiety   . Degenerative disc disease, lumbar   . Degenerative disc disease, lumbar 2012   lumbar and cervical region  . Depression   . Family history of adverse reaction to anesthesia    pt's mother has hx. of post-op N/V  . Headache   . Hypothyroidism   . IBS (irritable bowel syndrome)   . Nasal septal deviation 11/2017  . Nasal turbinate hypertrophy 11/2017  . PONV (postoperative nausea and vomiting)   . Snoring 11/2017    Past Surgical History:  Procedure Laterality Date  . ABDOMINAL HYSTERECTOMY  2005   partial  . ANTERIOR CERVICAL DECOMP/DISCECTOMY FUSION  03/25/2007   C4-5  . CERVICAL FUSION  2013  . CHOLECYSTECTOMY N/A 03/26/2018   Procedure: LAPAROSCOPIC CHOLECYSTECTOMY;  Surgeon: Franky Macho, MD;  Location: [facility];  Service: General;  Laterality: N/A;  . KNEE ARTHROSCOPY Right   . LAPAROSCOPIC GASTRIC SLEEVE RESECTION N/A 09/27/2019   Procedure: LAPAROSCOPIC GASTRIC SLEEVE RESECTION, HIATAL HERNIA REPARI ;Upper Endo, Eras Pathway;  Surgeon: Gaynelle Adu, MD;  Location: [facility];  Service: General;  Laterality: N/A;  . LUMBAR LAMINECTOMY/DECOMPRESSION MICRODISCECTOMY  11/21/2004   L4-5  . MAXIMUM ACCESS (MAS)POSTERIOR LUMBAR INTERBODY FUSION (PLIF) 1 LEVEL N/A 09/25/2014   Procedure: Lumbar  four-five MAXIMUM ACCESS (MAS) POSTERIOR LUMBAR INTERBODY FUSION (PLIF) 1 LEVEL;  Surgeon: Maeola Harman, MD;  [facility];  Service: Neurosurgery;  Laterality: N/A;  . NASAL SEPTOPLASTY W/ TURBINOPLASTY N/A 12/07/2017   Procedure: NASAL SEPTOPLASTY WITH BILATERAL TURBINATE REDUCTION;  Surgeon: Christia Reading, MD;  Location: [facility];  Service: ENT;  Laterality: N/A;  . ORIF ANKLE FRACTURE Right   . TONSILLECTOMY  06/2018  . UPPER GI ENDOSCOPY      There were no vitals filed for this visit.   Subjective Assessment - 01/29/21 1340    Subjective Pt. indicated 3/10 or so at worse in Rt arm in last few days, 7/10 at worst in Lt arm.  Was sore in shoulder for a night but then better.  Used exercises to help.    Pertinent History Cervical pain, bilateral shoulder pain c paresthesia.  Chronic, 2 previous cervical surgery ACDF C4-C5, C5-C6.  C3-C4.  History of physical therapy.     IBS, hypothyroidism, depression, DDD lumbar. Trouble sleeping indicated, maybe 4 hours usually.    Limitations Lifting;Other (comment)   stiffness after sitting   Patient Stated Goals Reduce pain    Currently in Pain? Yes    Pain Location Neck    Pain Orientation Left;Right    Pain Radiating Towards Rt and Lt arm (no symptoms upon arrival today)    Pain Onset More than a month ago    Aggravating  Factors  work.    Pain Relieving Factors treatment helped Rt arm from last visit.                             [facility] Adult PT Treatment/Exercise - 01/29/21 0001      Neck Exercises: Machines for Strengthening   UBE (Upper Arm Bike) Lvl 2.0 3 mins fwd/back each way      Neck Exercises: Theraband   Shoulder Extension Green   3 x 10   Rows Green   3 x 10   Shoulder External Rotation Green;20 reps   bilateral     Neck Exercises: Seated   Other Seated Exercise cross arm stretch 15 sec x 3 bilateral      Manual Therapy   Manual therapy comments compression to trigger points Rt, Lt   infraspinatus            Trigger Point Dry Needling - 01/29/21 0001    Consent Given? Yes    Education Handout Provided Previously provided    Muscles Treated Upper Quadrant Infraspinatus   bilateral   Infraspinatus Response Twitch response elicited                PT Education - 01/29/21 1403    Education Details HEP progression.    Person(s) Educated Patient    Methods Explanation;Demonstration;Verbal cues;Handout    Comprehension Returned demonstration;Verbalized understanding            PT Short Term Goals - 01/29/21 1359      PT SHORT TERM GOAL #1   Title Patient will demonstrate independent use of home exercise program to maintain progress from in clinic treatments.    Time 3    Period Weeks    Status On-going    Target Date 02/12/21             PT Long Term Goals - 01/22/21 1237      PT LONG TERM GOAL #1   Title Patient will demonstrate/report pain at worst less than or equal to 2/10 to facilitate minimal limitation in daily activity secondary to pain symptoms.    Time 8    Period Weeks    Status New    Target Date 03/19/21      PT LONG TERM GOAL #2   Title Patient will demonstrate independent use of home exercise program to facilitate ability to maintain/progress functional gains from skilled physical therapy services.    Time 8    Period Weeks    Status New    Target Date 03/19/21      PT LONG TERM GOAL #3   Title Pt. will demonstrate cervical AROM WFL s symptoms increase to facilitate daily movement at PLOF.    Time 8    Period Weeks    Status New    Target Date 03/19/21      PT LONG TERM GOAL #4   Title FOTO outcome > = 54% to indicated reduced disability due to condition.    Time 8    Period Weeks    Status New    Target Date 03/19/21      PT LONG TERM GOAL #5   Title Pt. will demonstrate/report ability to sleep, perform work at [employer] s limitation.    Time 8    Period Weeks    Status New    Target Date 03/19/21      Additional  Long Term Goals  Additional Long Term Goals Yes      PT LONG TERM GOAL #6   Title Pt. will demonstrate UE MMT 5/5 throughout s symptoms for lift/carry.    Time 8    Period Weeks    Status New    Target Date 03/19/21                 Plan - 01/29/21 1354    Clinical Impression Statement Positive improvement in Rt UE post manual intervention from evaluation visit. Additional scapular and rotator cuff strengthening intervention added to HEP c good tolerance.  Continued manual intervention/skilled PT services indicated at this time to progress.    Personal Factors and Comorbidities Past/Current Experience;Comorbidity 3+    Comorbidities IBS, hypothyroidism, depression, DDD lumbar, multiple cervical fusion surgery    Examination-Activity Limitations Sleep;Bend;Carry;Lift;Sit    Examination-Participation Restrictions Community Activity;Driving;Occupation;Yard Work    Stability/Clinical Decision Making Evolving/Moderate complexity    Rehab Potential --   fair to good   PT Frequency --   1-2x/week   PT Duration 8 weeks    PT Treatment/Interventions ADLs/Self Care Home Management;Cryotherapy;Electrical Stimulation;Therapeutic activities;Iontophoresis 4mg /ml Dexamethasone;Moist Heat;Balance training;Therapeutic exercise;Functional mobility training;DME Instruction;Ultrasound;Neuromuscular re-education;Patient/family education;Passive range of motion;Dry needling;Taping;Manual techniques    PT Next Visit Plan DN, manual for upper trap, levator scap, infraspinatus bilateral.  Postural strengthening/endurance.    PT Home Exercise Plan [facility]    Consulted and Agree with Plan of Care Patient           Patient will benefit from skilled therapeutic intervention in order to improve the following deficits and impairments:  Pain,Increased fascial restricitons,Increased muscle spasms,Decreased strength,Decreased activity tolerance,Improper body mechanics,Impaired perceived functional  ability,Impaired flexibility,Postural dysfunction,Decreased range of motion,Hypomobility,Decreased endurance,Decreased mobility  Visit Diagnosis: Cervicalgia  Pain in left arm  Pain in right arm  Abnormal posture  Muscle weakness (generalized)     Problem List Patient Active Problem List   Diagnosis Date Noted  . Trochanteric bursitis, left hip 08/30/2020  . 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 Pain in left knee 10/12/2019  . Obesity (BMI 30-39.9) 09/27/2019  . GERD (gastroesophageal reflux disease) 09/27/2019  . Essential hypertension 09/27/2019  . Fatty liver 09/27/2019  . Metabolic syndrome 09/27/2019  . Pain in right ankle and joints of right foot 04/13/2019  . Chronic cholecystitis   . Primary insomnia 06/25/2017  . Papanicolaou smear for cervical cancer screening 06/25/2017  . Bilateral hearing loss 06/25/2017  . Acute serous otitis media 06/25/2017  . Lumbar stenosis with neurogenic claudication 09/25/2014    09/27/2014, PT, DPT, OCS, ATC 01/29/21  3:07 PM    [facility] Physical Therapy [address] [place], [place], [place] Phone: [phone]   Fax:  [phone]  Name: Rogene Meth MRN: Guido Sander Date of Birth: 1971-08-11

## 2021-02-07 ENCOUNTER — Encounter: Payer: 59 | Admitting: Rehabilitative and Restorative Service Providers"

## 2021-02-08 ENCOUNTER — Encounter: Payer: Self-pay | Admitting: Physical Therapy

## 2021-02-08 ENCOUNTER — Other Ambulatory Visit: Payer: Self-pay

## 2021-02-08 ENCOUNTER — Ambulatory Visit (INDEPENDENT_AMBULATORY_CARE_PROVIDER_SITE_OTHER): Payer: 59 | Admitting: Physical Therapy

## 2021-02-08 DIAGNOSIS — M79602 Pain in left arm: Secondary | ICD-10-CM

## 2021-02-08 DIAGNOSIS — M542 Cervicalgia: Secondary | ICD-10-CM | POA: Diagnosis not present

## 2021-02-08 DIAGNOSIS — R293 Abnormal posture: Secondary | ICD-10-CM

## 2021-02-08 DIAGNOSIS — M6281 Muscle weakness (generalized): Secondary | ICD-10-CM

## 2021-02-08 DIAGNOSIS — M79601 Pain in right arm: Secondary | ICD-10-CM | POA: Diagnosis not present

## 2021-02-08 NOTE — Therapy (Addendum)
[facility] Physical Therapy [address] [place], [place], [address] Phone: [phone]   Fax:  [phone]  Physical Therapy Treatment/Discharge   Patient Details  Name: Hannah Lynch MRN: [id number] Date of Birth: 1971/06/20 Referring Provider (PT): Dr. Ernestina Patches   Encounter Date: 02/08/2021   PT End of Session - 02/08/21 0910    Visit Number 3    Number of Visits 12    Date for PT Re-Evaluation 03/19/21    PT Start Time 0828    PT Stop Time 0909    PT Time Calculation (min) 41 min    Activity Tolerance Patient tolerated treatment well    Behavior During Therapy [facility] for tasks assessed/performed           Past Medical History:  Diagnosis Date  . Anxiety   . Degenerative disc disease, lumbar   . Degenerative disc disease, lumbar 2012   lumbar and cervical region  . Depression   . Family history of adverse reaction to anesthesia    pt's mother has hx. of post-op N/V  . Headache   . Hypothyroidism   . IBS (irritable bowel syndrome)   . Nasal septal deviation 11/2017  . Nasal turbinate hypertrophy 11/2017  . PONV (postoperative nausea and vomiting)   . Snoring 11/2017    Past Surgical History:  Procedure Laterality Date  . ABDOMINAL HYSTERECTOMY  2005   partial  . ANTERIOR CERVICAL DECOMP/DISCECTOMY FUSION  03/25/2007   C4-5  . CERVICAL FUSION  2013  . CHOLECYSTECTOMY N/A 03/26/2018   Procedure: LAPAROSCOPIC CHOLECYSTECTOMY;  Surgeon: Aviva Signs, MD;  Location: [facility];  Service: General;  Laterality: N/A;  . KNEE ARTHROSCOPY Right   . LAPAROSCOPIC GASTRIC SLEEVE RESECTION N/A 09/27/2019   Procedure: LAPAROSCOPIC GASTRIC SLEEVE RESECTION, HIATAL HERNIA REPARI ;Upper Endo, Eras Pathway;  Surgeon: Greer Pickerel, MD;  Location: [facility];  Service: General;  Laterality: N/A;  . LUMBAR LAMINECTOMY/DECOMPRESSION MICRODISCECTOMY  11/21/2004   L4-5  . MAXIMUM ACCESS (MAS)POSTERIOR LUMBAR INTERBODY FUSION (PLIF) 1 LEVEL N/A 09/25/2014   Procedure:  Lumbar four-five MAXIMUM ACCESS (MAS) POSTERIOR LUMBAR INTERBODY FUSION (PLIF) 1 LEVEL;  Surgeon: Erline Levine, MD;  Location: [place] [facility];  Service: Neurosurgery;  Laterality: N/A;  . NASAL SEPTOPLASTY W/ TURBINOPLASTY N/A 12/07/2017   Procedure: NASAL SEPTOPLASTY WITH BILATERAL TURBINATE REDUCTION;  Surgeon: Melida Quitter, MD;  Location: [place];  Service: ENT;  Laterality: N/A;  . ORIF ANKLE FRACTURE Right   . TONSILLECTOMY  06/2018  . UPPER GI ENDOSCOPY      There were no vitals filed for this visit.   Subjective Assessment - 02/08/21 0827    Subjective did not feel like DN was beneficial last visit.  Reports pain about 4/10    Pertinent History Cervical pain, bilateral shoulder pain c paresthesia.  Chronic, 2 previous cervical surgery ACDF C4-C5, C5-C6.  C3-C4.  History of physical therapy.     IBS, hypothyroidism, depression, DDD lumbar. Trouble sleeping indicated, maybe 4 hours usually.    Limitations Lifting;Other (comment)   stiffness after sitting   Patient Stated Goals Reduce pain    Currently in Pain? Yes    Pain Score 4     Pain Location Neck    Pain Orientation Right;Left    Pain Descriptors / Indicators Tingling;Numbness;Sharp    Pain Type Chronic pain    Pain Onset More than a month ago    Pain Frequency Constant    Aggravating Factors  working    Pain Relieving  Factors "not really"                             [facility] Adult PT Treatment/Exercise - 02/08/21 0829      Neck Exercises: Machines for Strengthening   UBE (Upper Arm Bike) Lvl 2.0 3 mins fwd/back each way      Neck Exercises: Theraband   Shoulder Extension Green   3 x 10   Rows Green   3 x 10   Shoulder External Rotation Green   3x10     Manual Therapy   Manual therapy comments compression to bil UT and LS      Neck Exercises: Stretches   Upper Trapezius Stretch Right;Left;3 reps;30 seconds    Levator Stretch Right;Left;3 reps;30 seconds    Other Neck Stretches  cross body stretch 3x10 sec hold bil            Trigger Point Dry Needling - 02/08/21 0001    Consent Given? Yes    Education Handout Provided Previously provided    Muscles Treated Head and Neck Upper trapezius;Levator scapulae    Upper Trapezius Response Twitch reponse elicited    Levator Scapulae Response Twitch response elicited                  PT Short Term Goals - 01/29/21 1359      PT SHORT TERM GOAL #1   Title Patient will demonstrate independent use of home exercise program to maintain progress from in clinic treatments.    Time 3    Period Weeks    Status On-going    Target Date 02/12/21             PT Long Term Goals - 01/22/21 1237      PT LONG TERM GOAL #1   Title Patient will demonstrate/report pain at worst less than or equal to 2/10 to facilitate minimal limitation in daily activity secondary to pain symptoms.    Time 8    Period Weeks    Status New    Target Date 03/19/21      PT LONG TERM GOAL #2   Title Patient will demonstrate independent use of home exercise program to facilitate ability to maintain/progress functional gains from skilled physical therapy services.    Time 8    Period Weeks    Status New    Target Date 03/19/21      PT LONG TERM GOAL #3   Title Pt. will demonstrate cervical AROM WFL s symptoms increase to facilitate daily movement at PLOF.    Time 8    Period Weeks    Status New    Target Date 03/19/21      PT LONG TERM GOAL #4   Title FOTO outcome > = 54% to indicated reduced disability due to condition.    Time 8    Period Weeks    Status New    Target Date 03/19/21      PT LONG TERM GOAL #5   Title Pt. will demonstrate/report ability to sleep, perform work at [employer] s limitation.    Time 8    Period Weeks    Status New    Target Date 03/19/21      Additional Long Term Goals   Additional Long Term Goals Yes      PT LONG TERM GOAL #6   Title Pt. will demonstrate UE MMT 5/5 throughout s symptoms for  lift/carry.  Time 8    Period Weeks    Status New    Target Date 03/19/21                 Plan - 02/08/21 0911    Clinical Impression Statement Pt reported no change in symptoms following previous session, and she did want to try DN to upper traps and levator scapulae muscles today.  Positive response to manual therapy and DN today to other muscle groups.  Will continue to benefit from PT to maximize function.    Personal Factors and Comorbidities Past/Current Experience;Comorbidity 3+    Comorbidities IBS, hypothyroidism, depression, DDD lumbar, multiple cervical fusion surgery    Examination-Activity Limitations Sleep;Bend;Carry;Lift;Sit    Examination-Participation Restrictions Community Activity;Driving;Occupation;Yard Work    Stability/Clinical Decision Making Evolving/Moderate complexity    Rehab Potential --   fair to good   PT Frequency --   1-2x/week   PT Duration 8 weeks    PT Treatment/Interventions ADLs/Self Care Home Management;Cryotherapy;Electrical Stimulation;Therapeutic activities;Iontophoresis 4mg /ml Dexamethasone;Moist Heat;Balance training;Therapeutic exercise;Functional mobility training;DME Instruction;Ultrasound;Neuromuscular re-education;Patient/family education;Passive range of motion;Dry needling;Taping;Manual techniques    PT Next Visit Plan DN, manual for upper trap, levator scap, infraspinatus bilateral.  Postural strengthening/endurance.  see how DN went - consider other modalities PRN    PT Home Exercise Plan [place] and Agree with Plan of Care Patient           Patient will benefit from skilled therapeutic intervention in order to improve the following deficits and impairments:  Pain,Increased fascial restricitons,Increased muscle spasms,Decreased strength,Decreased activity tolerance,Improper body mechanics,Impaired perceived functional ability,Impaired flexibility,Postural dysfunction,Decreased range of motion,Hypomobility,Decreased  endurance,Decreased mobility  Visit Diagnosis: Cervicalgia  Pain in left arm  Pain in right arm  Abnormal posture  Muscle weakness (generalized)     Problem List Patient Active Problem List   Diagnosis Date Noted  . Trochanteric bursitis, left hip 08/30/2020  . 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 Pain in left knee 10/12/2019  . Obesity (BMI 30-39.9) 09/27/2019  . GERD (gastroesophageal reflux disease) 09/27/2019  . Essential hypertension 09/27/2019  . Fatty liver 09/27/2019  . Metabolic syndrome 27/01/5008  . Pain in right ankle and joints of right foot 04/13/2019  . Chronic cholecystitis   . Primary insomnia 06/25/2017  . Papanicolaou smear for cervical cancer screening 06/25/2017  . Bilateral hearing loss 06/25/2017  . Acute serous otitis media 06/25/2017  . Lumbar stenosis with neurogenic claudication 09/25/2014      Laureen Abrahams, PT, DPT 02/08/21 9:13 AM   PHYSICAL THERAPY DISCHARGE SUMMARY  Visits from Start of Care: 3  Current functional level related to goals / functional outcomes: See note   Remaining deficits: See note   Education / Equipment: HEP Plan: Patient agrees to discharge.  Patient goals were partially met. Patient is being discharged due to not returning since the last visit.  ?????     Scot Jun, PT, DPT, OCS, ATC 04/09/21  11:35 AM      [facility] Physical Therapy [address] [place], [place], [address] Phone: [phone]   Fax:  [phone]  Name: Laurencia Roma MRN: [id number] Date of Birth: 12/31/1970

## 2021-02-09 ENCOUNTER — Ambulatory Visit
Admission: RE | Admit: 2021-02-09 | Discharge: 2021-02-09 | Disposition: A | Discharge status: Home / Self Care | Payer: 59 | Source: Ambulatory Visit | Attending: Orthopaedic Surgery | Admitting: Orthopaedic Surgery

## 2021-02-09 DIAGNOSIS — G8929 Other chronic pain: Secondary | ICD-10-CM

## 2021-02-12 ENCOUNTER — Encounter: Payer: 59 | Admitting: Rehabilitative and Restorative Service Providers"

## 2021-02-12 ENCOUNTER — Telehealth: Payer: Self-pay | Admitting: Rehabilitative and Restorative Service Providers"

## 2021-02-12 NOTE — Telephone Encounter (Signed)
Called about no show.  She said she got called into work and forgot to call us.  Confirmed 4/8 8:45 AM appointment.

## 2021-02-15 ENCOUNTER — Encounter: Payer: 59 | Admitting: Rehabilitative and Restorative Service Providers"

## 2021-02-19 ENCOUNTER — Ambulatory Visit (INDEPENDENT_AMBULATORY_CARE_PROVIDER_SITE_OTHER): Payer: 59 | Admitting: Orthopaedic Surgery

## 2021-02-19 ENCOUNTER — Encounter: Payer: Self-pay | Admitting: Orthopaedic Surgery

## 2021-02-19 ENCOUNTER — Other Ambulatory Visit: Payer: Self-pay

## 2021-02-19 ENCOUNTER — Encounter: Payer: 59 | Admitting: Rehabilitative and Restorative Service Providers"

## 2021-02-19 DIAGNOSIS — G8929 Other chronic pain: Secondary | ICD-10-CM

## 2021-02-19 DIAGNOSIS — M25562 Pain in left knee: Secondary | ICD-10-CM

## 2021-02-19 NOTE — Progress Notes (Signed)
Office Visit Note   Patient: Hannah Lynch           Date of Birth: 01-May-1971           MRN: [id number] Visit Date: 02/19/2021              Requested by: Eartha Inch, MD [id number] [place],  [place] [address] PCP: Eartha Inch, MD   Assessment & Plan: Visit Diagnoses:  1. Chronic pain of left knee     Plan: MRI scan of left knee demonstrated a diminutive residual lateral meniscal tissue compatible with prior meniscectomy.  No evidence of recurrent tear or tear of the medial meniscus.  The patellofemoral joint noted primarily moderate degenerative chondral thinning along the patella facets with more striking chondral thinning in the femoral trochlear groove and moderate degenerative chondral thinning and medially as well as laterally.  There is a small Baker's cyst and a small effusion with mild synovitis.  There was some chronically thickened appearance of the extensor mechanism and inferior part of the medial patellar retinaculum of unknown significance.  Presently Hannah Lynch is doing well and really does not have much pain.  She was relieved to know that she did not have any other meniscal tear or anything loose in her knee.  We have had a long discussion regarding her knee and the fact that it is most likely related to the arthritis.  We talked about different treatment options over time.  Follow-Up Instructions: Return if symptoms worsen or fail to improve.   Orders:  No orders of the defined types were placed in this encounter.  No orders of the defined types were placed in this encounter.     Procedures: No procedures performed   Clinical Data: No additional findings.   Subjective: Chief Complaint  Patient presents with  . Left Knee - Pain  No change in symptoms.  Presently doing well in terms of her pain in the left knee.  Not having any limp and minimal discomfort.  She has taking either Advil and/or Tylenol in the past  HPI  Review of  Systems   Objective: Vital Signs: There were no vitals taken for this visit.  Physical Exam Constitutional:      Appearance: She is well-developed.  Eyes:     Pupils: Pupils are equal, round, and reactive to light.  Pulmonary:     Effort: Pulmonary effort is normal.  Skin:    General: Skin is warm and dry.  Neurological:     Mental Status: She is alert and oriented to person, place, and time.  Psychiatric:        Behavior: Behavior normal.     Ortho Exam left knee was not hot red or warm.  No effusion.  No significant medial lateral joint pain.  Some mild patella crepitation but no particular pain with compression no popliteal mass.  No calf pain.  Range of motion 0 to about 105 without instability  Specialty Comments:  No specialty comments available.  Imaging: No results found.   PMFS History: Patient Active Problem List   Diagnosis Date Noted  . Trochanteric bursitis, left hip 08/30/2020  . 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 Pain in left knee 10/12/2019  . Obesity (BMI 30-39.9) 09/27/2019  . GERD (gastroesophageal reflux disease) 09/27/2019  . Essential hypertension 09/27/2019  . Fatty liver 09/27/2019  . Metabolic syndrome 09/27/2019  . Pain in right ankle and  joints of right foot 04/13/2019  . Chronic cholecystitis   . Primary insomnia 06/25/2017  . Papanicolaou smear for cervical cancer screening 06/25/2017  . Bilateral hearing loss 06/25/2017  . Acute serous otitis media 06/25/2017  . Lumbar stenosis with neurogenic claudication 09/25/2014   Past Medical History:  Diagnosis Date  . Anxiety   . Degenerative disc disease, lumbar   . Degenerative disc disease, lumbar 2012   lumbar and cervical region  . Depression   . Family history of adverse reaction to anesthesia    pt's mother has hx. of post-op N/V  . Headache   . Hypothyroidism   . IBS (irritable bowel syndrome)   . Nasal septal deviation 11/2017  . Nasal  turbinate hypertrophy 11/2017  . PONV (postoperative nausea and vomiting)   . Snoring 11/2017    Family History  Problem Relation Age of Onset  . Hypertension Mother   . Diabetes Mother   . Heart failure Father   . Hypertension Father   . Diabetes Father   . Heart failure Other   . Breast cancer Paternal Grandmother     Past Surgical History:  Procedure Laterality Date  . ABDOMINAL HYSTERECTOMY  2005   partial  . ANTERIOR CERVICAL DECOMP/DISCECTOMY FUSION  03/25/2007   C4-5  . CERVICAL FUSION  2013  . CHOLECYSTECTOMY N/A 03/26/2018   Procedure: LAPAROSCOPIC CHOLECYSTECTOMY;  Surgeon: Franky Macho, MD;  Location: [facility];  Service: General;  Laterality: N/A;  . KNEE ARTHROSCOPY Right   . LAPAROSCOPIC GASTRIC SLEEVE RESECTION N/A 09/27/2019   Procedure: LAPAROSCOPIC GASTRIC SLEEVE RESECTION, HIATAL HERNIA REPARI ;Upper Endo, Eras Pathway;  Surgeon: Gaynelle Adu, MD;  Location: [facility];  Service: General;  Laterality: N/A;  . LUMBAR LAMINECTOMY/DECOMPRESSION MICRODISCECTOMY  11/21/2004   L4-5  . MAXIMUM ACCESS (MAS)POSTERIOR LUMBAR INTERBODY FUSION (PLIF) 1 LEVEL N/A 09/25/2014   Procedure: Lumbar four-five MAXIMUM ACCESS (MAS) POSTERIOR LUMBAR INTERBODY FUSION (PLIF) 1 LEVEL;  Surgeon: Maeola Harman, MD;  Location: [facility];  Service: Neurosurgery;  Laterality: N/A;  . NASAL SEPTOPLASTY W/ TURBINOPLASTY N/A 12/07/2017   Procedure: NASAL SEPTOPLASTY WITH BILATERAL TURBINATE REDUCTION;  Surgeon: Christia Reading, MD;  Location: [facility];  Service: ENT;  Laterality: N/A;  . ORIF ANKLE FRACTURE Right   . TONSILLECTOMY  06/2018  . UPPER GI ENDOSCOPY     Social History   Occupational History  . Not on file  Tobacco Use  . Smoking status: Never Smoker  . Smokeless tobacco: Never Used  Vaping Use  . Vaping Use: Never used  Substance and Sexual Activity  . Alcohol use: No  . Drug use: No  . Sexual activity: Yes    Birth control/protection: None     Valeria Batman, MD   Note - This record has been created using [employer].  Chart creation errors have been sought, but may not always  have been located. Such creation errors do not reflect on  the standard of medical care.

## 2021-02-21 ENCOUNTER — Encounter: Payer: 59 | Admitting: Rehabilitative and Restorative Service Providers"

## 2021-02-26 ENCOUNTER — Encounter: Payer: 59 | Admitting: Rehabilitative and Restorative Service Providers"

## 2021-02-28 ENCOUNTER — Encounter: Payer: 59 | Admitting: Rehabilitative and Restorative Service Providers"

## 2021-03-05 ENCOUNTER — Encounter: Payer: 59 | Admitting: Physical Therapy

## 2021-03-07 ENCOUNTER — Encounter: Payer: 59 | Admitting: Physical Therapy

## 2021-03-12 ENCOUNTER — Encounter: Payer: 59 | Admitting: Rehabilitative and Restorative Service Providers"

## 2021-03-14 ENCOUNTER — Encounter: Payer: 59 | Admitting: Rehabilitative and Restorative Service Providers"

## 2021-03-18 ENCOUNTER — Other Ambulatory Visit: Payer: Self-pay

## 2021-03-18 ENCOUNTER — Ambulatory Visit
Admission: RE | Admit: 2021-03-18 | Discharge: 2021-03-18 | Disposition: A | Discharge status: Home / Self Care | Payer: 59 | Source: Ambulatory Visit | Attending: Family Medicine | Admitting: Family Medicine

## 2021-03-18 DIAGNOSIS — Z1231 Encounter for screening mammogram for malignant neoplasm of breast: Secondary | ICD-10-CM

## 2021-03-19 ENCOUNTER — Encounter: Payer: 59 | Admitting: Rehabilitative and Restorative Service Providers"

## 2021-03-21 ENCOUNTER — Encounter: Payer: 59 | Admitting: Rehabilitative and Restorative Service Providers"

## 2021-03-26 ENCOUNTER — Encounter: Payer: 59 | Admitting: Rehabilitative and Restorative Service Providers"

## 2021-03-28 ENCOUNTER — Encounter: Payer: 59 | Admitting: Rehabilitative and Restorative Service Providers"

## 2021-04-01 ENCOUNTER — Other Ambulatory Visit: Payer: Self-pay

## 2021-04-01 ENCOUNTER — Ambulatory Visit
Admission: EM | Admit: 2021-04-01 | Discharge: 2021-04-01 | Disposition: A | Discharge status: Home / Self Care | Payer: 59 | Attending: Family Medicine | Admitting: Family Medicine

## 2021-04-01 ENCOUNTER — Encounter: Payer: Self-pay | Admitting: Emergency Medicine

## 2021-04-01 DIAGNOSIS — K047 Periapical abscess without sinus: Secondary | ICD-10-CM

## 2021-04-01 DIAGNOSIS — R6884 Jaw pain: Secondary | ICD-10-CM | POA: Diagnosis not present

## 2021-04-01 MED ORDER — HYDROCODONE-ACETAMINOPHEN 5-325 MG PO TABS: 1.0000 | ORAL_TABLET | Freq: Four times a day (QID) | ORAL | 0 refills | Status: AC | PRN

## 2021-04-01 MED ORDER — CLINDAMYCIN HCL 150 MG PO CAPS: 150.0000 mg | ORAL_CAPSULE | Freq: Four times a day (QID) | ORAL | 0 refills | Status: DC

## 2021-04-01 MED ORDER — KETOROLAC TROMETHAMINE 30 MG/ML IJ SOLN
30.0000 mg | Freq: Once | INTRAMUSCULAR | Status: AC
  Administered 2021-04-01: 30 mg via INTRAMUSCULAR

## 2021-04-01 NOTE — ED Triage Notes (Signed)
Pt said went to get her teeth pulled on Thursday and now she is having severe pain and swelling in her bottom right jaw. Pt is on antibiotics already.

## 2021-04-01 NOTE — Discharge Instructions (Addendum)
You have received a toradol injection in the office for pain today. Do not take ibuprofen for 6 hours after the injection today.  I have sent in Clindamycin for you to take 4 times per day for 7 days  Take ibuprofen as needed for pain.    Take the prescribed Norco as needed for severe pain; do not drive, operate machinery, or drink alcohol with this medication as it may cause drowsiness.  Follow up with this office or with primary care if symptoms are persisting.  Follow up in the ER for high fever, trouble swallowing, trouble breathing, other concerning symptoms.

## 2021-04-05 NOTE — ED Provider Notes (Signed)
[facility]    CSN: [id number] Arrival date & time: 04/01/21  1608      History   Chief Complaint Chief Complaint  Patient presents with  . Dental Pain    HPI Hannah Lynch is a 50 y.o. female.   Reports that she has had 2 teeth pulled last week. Reports that she is having pain, swelling, bruising from the area. Also reports thick white/yellow discharge from the area where the back tooth was pulled. Has been taking Advil with minimal relief. Denies previous symptoms. Pain is aggravated with speaking and eating. Denies chills, body aches, abdominal pain, nausea, vomiting, diarrhea, rash, fever, other symptoms.   ROS per HPI  The history is provided by the patient.  Dental Pain   Past Medical History:  Diagnosis Date  . Anxiety   . Degenerative disc disease, lumbar   . Degenerative disc disease, lumbar 2012   lumbar and cervical region  . Depression   . Family history of adverse reaction to anesthesia    pt's mother has hx. of post-op N/V  . Headache   . Hypothyroidism   . IBS (irritable bowel syndrome)   . Nasal septal deviation 11/2017  . Nasal turbinate hypertrophy 11/2017  . PONV (postoperative nausea and vomiting)   . Snoring 11/2017    Patient Active Problem List   Diagnosis Date Noted  . Trochanteric bursitis, left hip 08/30/2020  . 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 Pain in left knee 10/12/2019  . Obesity (BMI 30-39.9) 09/27/2019  . GERD (gastroesophageal reflux disease) 09/27/2019  . Essential hypertension 09/27/2019  . Fatty liver 09/27/2019  . Metabolic syndrome 09/27/2019  . Pain in right ankle and joints of right foot 04/13/2019  . Chronic cholecystitis   . Primary insomnia 06/25/2017  . Papanicolaou smear for cervical cancer screening 06/25/2017  . Bilateral hearing loss 06/25/2017  . Acute serous otitis media 06/25/2017  . Lumbar stenosis with neurogenic claudication 09/25/2014    Past  Surgical History:  Procedure Laterality Date  . ABDOMINAL HYSTERECTOMY  2005   partial  . ANTERIOR CERVICAL DECOMP/DISCECTOMY FUSION  03/25/2007   C4-5  . CERVICAL FUSION  2013  . CHOLECYSTECTOMY N/A 03/26/2018   Procedure: LAPAROSCOPIC CHOLECYSTECTOMY;  Surgeon: Franky Macho, MD;  Location: [facility];  Service: General;  Laterality: N/A;  . KNEE ARTHROSCOPY Right   . LAPAROSCOPIC GASTRIC SLEEVE RESECTION N/A 09/27/2019   Procedure: LAPAROSCOPIC GASTRIC SLEEVE RESECTION, HIATAL HERNIA REPARI ;Upper Endo, Eras Pathway;  Surgeon: Gaynelle Adu, MD;  Location: [facility];  Service: General;  Laterality: N/A;  . LUMBAR LAMINECTOMY/DECOMPRESSION MICRODISCECTOMY  11/21/2004   L4-5  . MAXIMUM ACCESS (MAS)POSTERIOR LUMBAR INTERBODY FUSION (PLIF) 1 LEVEL N/A 09/25/2014   Procedure: Lumbar four-five MAXIMUM ACCESS (MAS) POSTERIOR LUMBAR INTERBODY FUSION (PLIF) 1 LEVEL;  Surgeon: Maeola Harman, MD;  Location: [facility];  Service: Neurosurgery;  Laterality: N/A;  . NASAL SEPTOPLASTY W/ TURBINOPLASTY N/A 12/07/2017   Procedure: NASAL SEPTOPLASTY WITH BILATERAL TURBINATE REDUCTION;  Surgeon: Christia Reading, MD;  Location: [facility];  Service: ENT;  Laterality: N/A;  . ORIF ANKLE FRACTURE Right   . TONSILLECTOMY  06/2018  . UPPER GI ENDOSCOPY      OB History   No obstetric history on file.      Home Medications    Prior to Admission medications   Medication Sig Start Date End Date Taking? Authorizing Provider  clindamycin (CLEOCIN) 150 MG capsule Take 1  capsule (150 mg total) by mouth every 6 (six) hours. 04/01/21  Yes Moshe Cipro, NP  cetirizine (ZYRTEC) 10 MG chewable tablet Chew 1 tablet (10 mg total) by mouth daily. 05/13/19   Wurst, [place], PA-C  fluticasone (FLONASE) 50 MCG/ACT nasal spray Place 2 sprays into both nostrils daily. 05/13/19   Wurst, [place], PA-C  montelukast (SINGULAIR) 10 MG tablet Take 10 mg by mouth daily. 09/06/19   [provider]  omeprazole  (PRILOSEC) 20 MG capsule Take 20 mg by mouth daily. 09/08/18   [provider]  SUMAtriptan (IMITREX) 50 MG tablet Take 50 mg by mouth every 2 (two) hours as needed for migraine.  10/14/17   [provider]    Family History Family History  Problem Relation Age of Onset  . Hypertension Mother   . Diabetes Mother   . Heart failure Father   . Hypertension Father   . Diabetes Father   . Heart failure Other   . Breast cancer Paternal Grandmother     Social History Social History   Tobacco Use  . Smoking status: Never Smoker  . Smokeless tobacco: Never Used  Vaping Use  . Vaping Use: Never used  Substance Use Topics  . Alcohol use: No  . Drug use: No     Allergies   Patient has no known allergies.   Review of Systems Review of Systems   Physical Exam Triage Vital Signs ED Triage Vitals  Enc Vitals Group     BP 04/01/21 1723 (!) 134/92     Pulse Rate 04/01/21 1723 94     Resp 04/01/21 1723 17     Temp 04/01/21 1723 98.1 F (36.7 C)     Temp Source 04/01/21 1723 Oral     SpO2 04/01/21 1723 98 %     Weight --      Height --      Head Circumference --      Peak Flow --      Pain Score 04/01/21 1734 10     Pain Loc --      Pain Edu? --      Excl. in [place]? --    No data found.  Updated Vital Signs BP (!) 134/92 (BP Location: Right Arm)   Pulse 94   Temp 98.1 F (36.7 C) (Oral)   Resp 17   SpO2 98%   Visual Acuity Right Eye Distance:   Left Eye Distance:   Bilateral Distance:    Right Eye Near:   Left Eye Near:    Bilateral Near:     Physical Exam Vitals and nursing note reviewed.  Constitutional:      General: She is not in acute distress.    Appearance: Normal appearance. She is well-developed.  HENT:     Head: Normocephalic.     Comments: Bruising and swelling to right lower jaw    Right Ear: Tympanic membrane, ear canal and external ear normal.     Left Ear: Tympanic membrane, ear canal and external ear normal.     Nose:  Nose normal.     Mouth/Throat:     Mouth: Mucous membranes are moist.     Dentition: Abnormal dentition. Dental tenderness and gingival swelling present.      Comments: Teeth #30, #31 absent, drainage from the area of #31 Eyes:     Extraocular Movements: Extraocular movements intact.     Conjunctiva/sclera: Conjunctivae normal.     Pupils: Pupils are equal, round, and reactive  to light.  Cardiovascular:     Rate and Rhythm: Normal rate and regular rhythm.  Pulmonary:     Effort: Pulmonary effort is normal. No respiratory distress.  Musculoskeletal:        General: Normal range of motion.     Cervical back: Normal range of motion and neck supple.  Lymphadenopathy:     Cervical: Cervical adenopathy present.  Skin:    General: Skin is warm and dry.     Capillary Refill: Capillary refill takes less than 2 seconds.  Neurological:     General: No focal deficit present.     Mental Status: She is alert and oriented to person, place, and time.  Psychiatric:        Mood and Affect: Mood normal.        Behavior: Behavior normal.        Thought Content: Thought content normal.      UC Treatments / Results  Labs (all labs ordered are listed, but only abnormal results are displayed) Labs Reviewed - No data to display  EKG   Radiology No results found.  Procedures Procedures (including critical care time)  Medications Ordered in UC Medications  ketorolac (TORADOL) 30 MG/ML injection 30 mg (30 mg Intramuscular Given 04/01/21 1812)    Initial Impression / Assessment and Plan / UC Course  I have reviewed the triage vital signs and the nursing notes.  Pertinent labs & imaging results that were available during my care of the patient were reviewed by me and considered in my medical decision making (see chart for details).    Jaw Pain Dental Infection  Toradol 30mg  IM in office today Clindamycin 150mg  QID x 7 days prescribed Take as directed and to completion May take  ibuprofen after 6 hours after injection Prescribed Norco for breakthrough pain Take as directed Follow up with dentistry as directed Follow up with this office or with primary care if symptoms are persisting.  Follow up in the ER for high fever, trouble swallowing, trouble breathing, other concerning symptoms.   Final Clinical Impressions(s) / UC Diagnoses   Final diagnoses:  Jaw pain  Dental infection     Discharge Instructions     You have received a toradol injection in the office for pain today. Do not take ibuprofen for 6 hours after the injection today.  I have sent in Clindamycin for you to take 4 times per day for 7 days  Take ibuprofen as needed for pain.    Take the prescribed Norco as needed for severe pain; do not drive, operate machinery, or drink alcohol with this medication as it may cause drowsiness.  Follow up with this office or with primary care if symptoms are persisting.  Follow up in the ER for high fever, trouble swallowing, trouble breathing, other concerning symptoms.     ED Prescriptions    Medication Sig Dispense Auth. Provider   clindamycin (CLEOCIN) 150 MG capsule Take 1 capsule (150 mg total) by mouth every 6 (six) hours. 28 capsule , NP   HYDROcodone-acetaminophen (NORCO/VICODIN) 5-325 MG tablet Take 1 tablet by mouth every 6 (six) hours as needed for up to 3 days for moderate pain or severe pain. 10 tablet June, NP     I have reviewed the PDMP during this encounter.   Moshe Cipro, NP 04/05/21 1928

## 2021-05-02 DIAGNOSIS — M5416 Radiculopathy, lumbar region: Secondary | ICD-10-CM | POA: Insufficient documentation

## 2021-07-09 IMAGING — DX DG WRIST COMPLETE 3+V*L*
3 series · 3 of 3 positions shown · non-contrast
Comparison: None.

CLINICAL DATA: Fell today with wrist pain

EXAM:
LEFT WRIST - COMPLETE 3+ VIEW

[wrist pa]
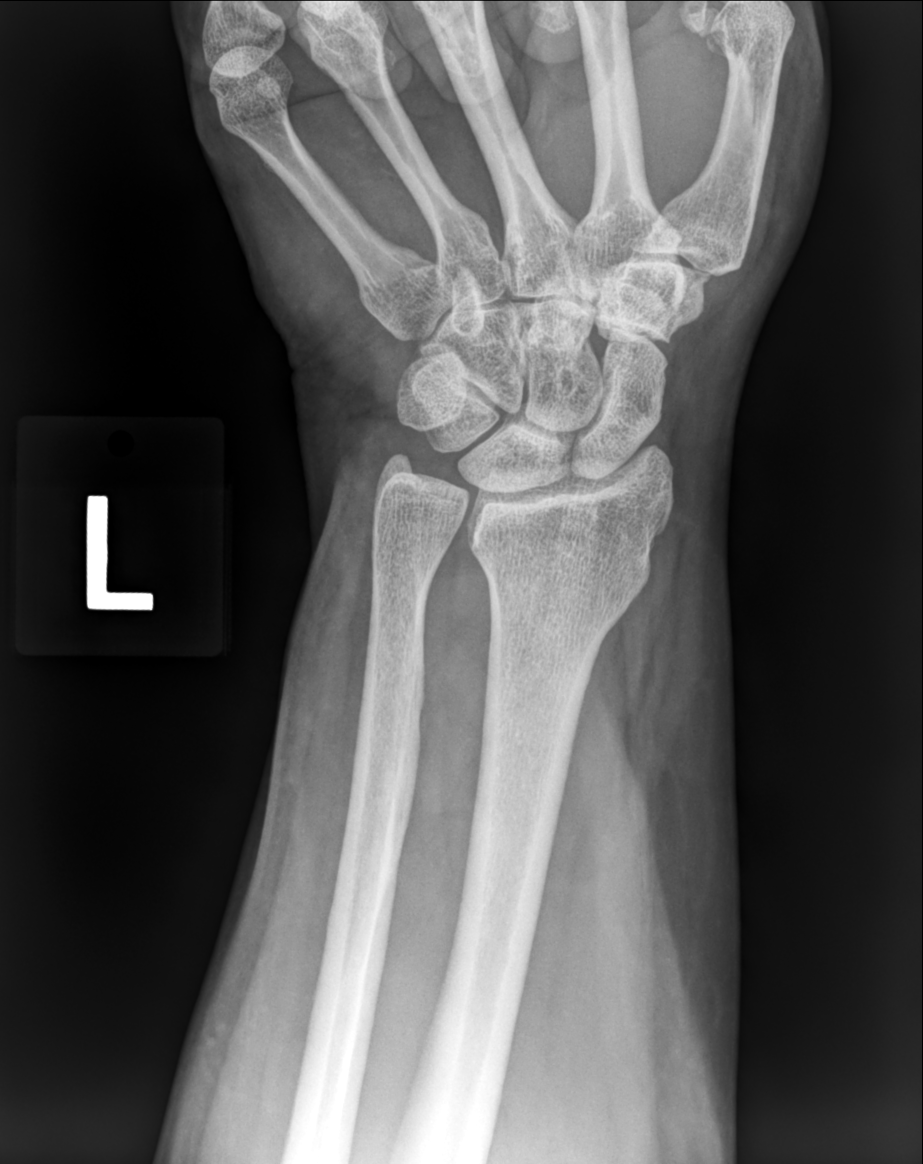

[wrist mlo]
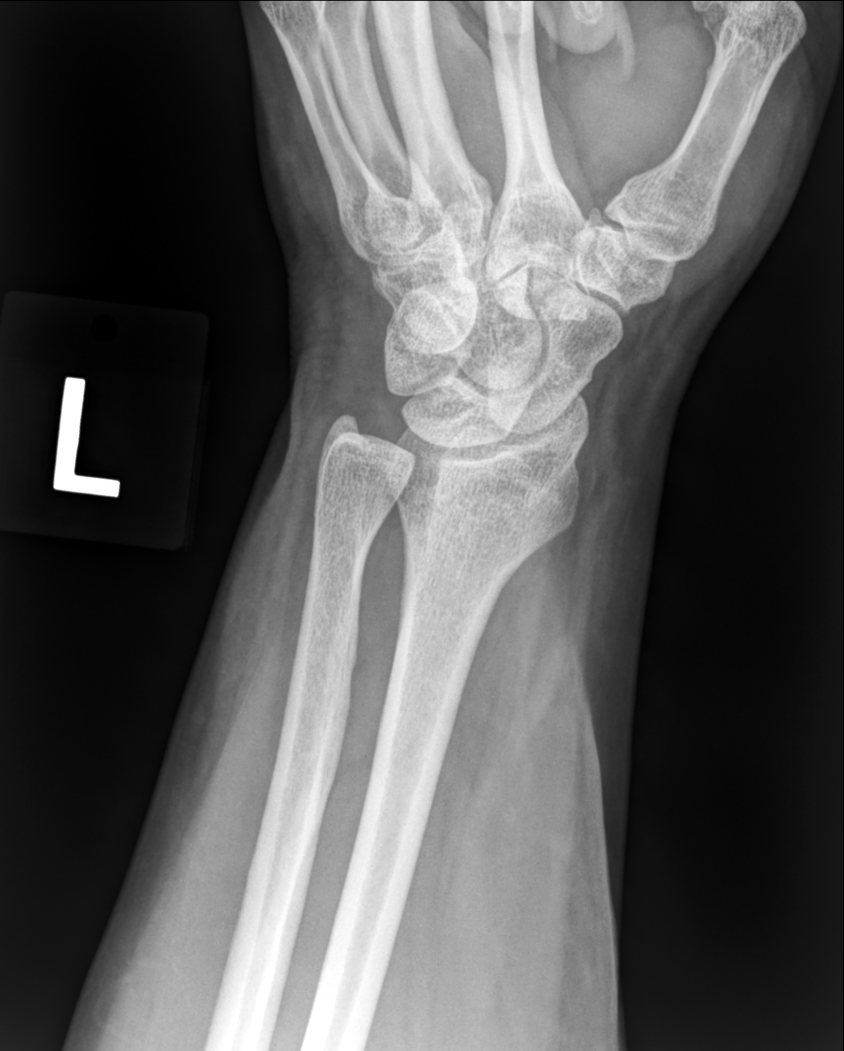

[wrist lat]
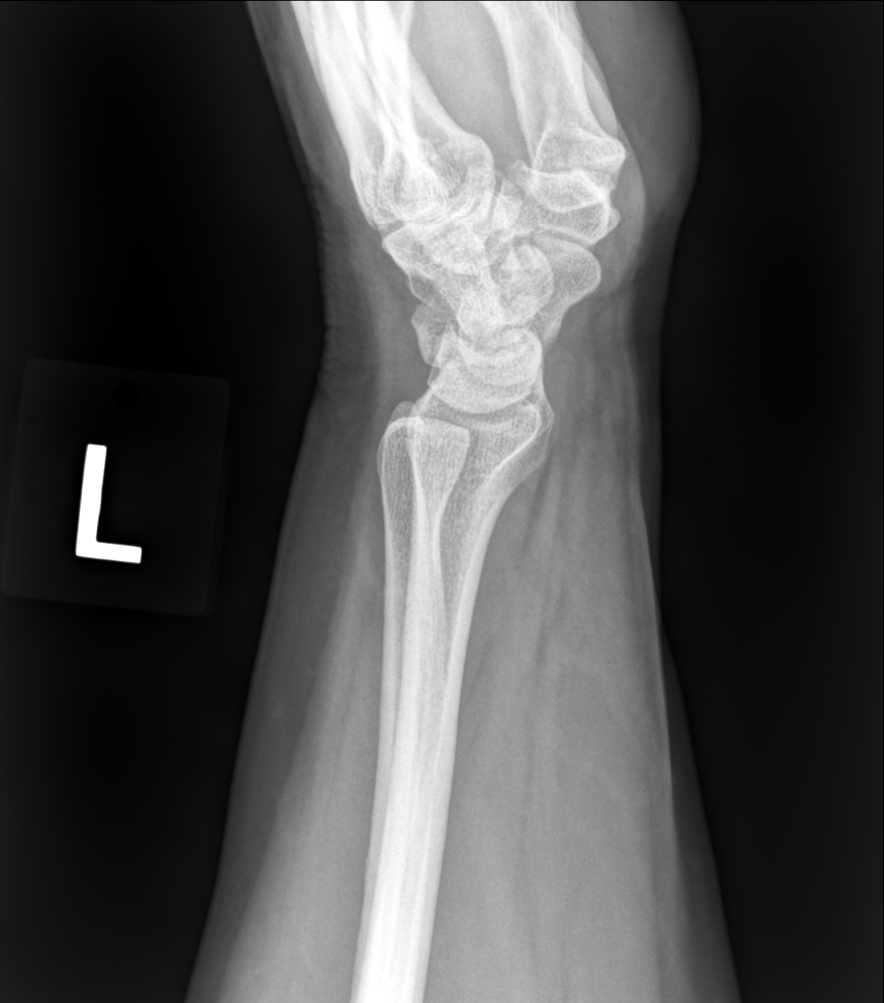

[3 of 3 positions shown; findings below may reference images not displayed]

FINDINGS: No acute or traumatic finding. Mild osteoarthritis at the first
carpometacarpal region.
IMPRESSION: No traumatic finding.

## 2021-08-27 ENCOUNTER — Ambulatory Visit (HOSPITAL_COMMUNITY): Payer: 59 | Admitting: Physical Therapy

## 2021-09-12 ENCOUNTER — Other Ambulatory Visit: Payer: Self-pay | Admitting: Orthopaedic Surgery

## 2021-09-12 DIAGNOSIS — M545 Low back pain, unspecified: Secondary | ICD-10-CM

## 2021-09-12 DIAGNOSIS — G8929 Other chronic pain: Secondary | ICD-10-CM

## 2021-09-16 ENCOUNTER — Ambulatory Visit (HOSPITAL_COMMUNITY): Payer: 59 | Attending: Neurosurgery | Admitting: Physical Therapy

## 2021-10-02 ENCOUNTER — Ambulatory Visit (HOSPITAL_COMMUNITY)
Admission: EM | Admit: 2021-10-02 | Discharge: 2021-10-02 | Disposition: A | Discharge status: Home / Self Care | Payer: 59 | Attending: Family Medicine | Admitting: Family Medicine

## 2021-10-02 ENCOUNTER — Other Ambulatory Visit: Payer: Self-pay

## 2021-10-02 ENCOUNTER — Encounter (HOSPITAL_COMMUNITY): Payer: Self-pay

## 2021-10-02 DIAGNOSIS — N390 Urinary tract infection, site not specified: Secondary | ICD-10-CM | POA: Diagnosis present

## 2021-10-02 LAB — POCT URINALYSIS DIPSTICK, ED / UC
Bilirubin Urine: NEGATIVE
Glucose, UA: NEGATIVE mg/dL
Ketones, ur: NEGATIVE mg/dL
Nitrite: NEGATIVE
Protein, ur: 100 mg/dL — AB
Specific Gravity, Urine: >=1.03 (ref 1.005–1.030)
Urobilinogen, UA: 0.2 mg/dL (ref 0.0–1.0)
pH: 5 (ref 5.0–8.0)

## 2021-10-02 MED ORDER — SULFAMETHOXAZOLE-TRIMETHOPRIM 800-160 MG PO TABS: 1.0000 | ORAL_TABLET | Freq: Two times a day (BID) | ORAL | 0 refills | Status: DC

## 2021-10-02 NOTE — ED Triage Notes (Signed)
Pt c/o urinary frequency/urgency and unable to empty bladder since yesterday. Denies burning or pain on urination.

## 2021-10-02 NOTE — ED Provider Notes (Signed)
[facility]    CSN: [id number] Arrival date & time: 10/02/21  1337      History   Chief Complaint Chief Complaint  Patient presents with   Urinary Tract Infection    HPI Hannah Lynch is a 50 y.o. female.   Presenting today with 1 day history of urinary urgency, frequency, hesitancy.  Denies fever, chills, nausea, vomiting, low back pain, vaginal symptoms, hematuria.  So far not trying anything over-the-counter for symptoms.  No known history of urinary tract infections in the past.   Past Medical History:  Diagnosis Date   Anxiety    Degenerative disc disease, lumbar    Degenerative disc disease, lumbar 2012   lumbar and cervical region   Depression    Family history of adverse reaction to anesthesia    pt's mother has hx. 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 08/30/2020   Pain in left knee 10/12/2019   Obesity (BMI 30-39.9) 09/27/2019   GERD (gastroesophageal reflux disease) 09/27/2019   Essential hypertension 09/27/2019   Fatty liver 09/27/2019   Metabolic syndrome 09/27/2019   Pain in right ankle and joints of right foot 04/13/2019   Chronic cholecystitis    Primary insomnia 06/25/2017   Papanicolaou smear for cervical cancer screening 06/25/2017   Bilateral hearing loss 06/25/2017   Acute serous otitis media 06/25/2017   Lumbar stenosis with neurogenic claudication 09/25/2014    Past Surgical History:  Procedure Laterality Date   ABDOMINAL HYSTERECTOMY  2005   partial   ANTERIOR CERVICAL DECOMP/DISCECTOMY FUSION  03/25/2007   C4-5   CERVICAL FUSION  2013   CHOLECYSTECTOMY N/A 03/26/2018   Procedure:  LAPAROSCOPIC CHOLECYSTECTOMY;  Surgeon: Franky Macho, MD;  Location: [facility];  Service: General;  Laterality: N/A;   KNEE ARTHROSCOPY Right    LAPAROSCOPIC GASTRIC SLEEVE RESECTION N/A 09/27/2019   Procedure: LAPAROSCOPIC GASTRIC SLEEVE RESECTION, HIATAL HERNIA REPARI ;Upper Endo, Eras Pathway;  Surgeon: Gaynelle Adu, MD;  Location: [facility];  Service: General;  Laterality: N/A;   LUMBAR LAMINECTOMY/DECOMPRESSION MICRODISCECTOMY  11/21/2004   L4-5   MAXIMUM ACCESS (MAS)POSTERIOR LUMBAR INTERBODY FUSION (PLIF) 1 LEVEL N/A 09/25/2014   Procedure: Lumbar four-five MAXIMUM ACCESS (MAS) POSTERIOR LUMBAR INTERBODY FUSION (PLIF) 1 LEVEL;  Surgeon: Maeola Harman, MD;  Location: [facility];  Service: Neurosurgery;  Laterality: N/A;   NASAL SEPTOPLASTY W/ TURBINOPLASTY N/A 12/07/2017   Procedure: NASAL SEPTOPLASTY WITH BILATERAL TURBINATE REDUCTION;  Surgeon: Christia Reading, MD;  Location: [facility];  Service: ENT;  Laterality: N/A;   ORIF ANKLE FRACTURE Right    TONSILLECTOMY  06/2018   UPPER GI ENDOSCOPY      OB History   No obstetric history on file.      Home Medications    Prior to Admission medications   Medication Sig Start Date End Date Taking? Authorizing Provider  sulfamethoxazole-trimethoprim (BACTRIM DS) 800-160 MG tablet Take 1 tablet by mouth 2 (two) times daily. 10/02/21  Yes Particia Nearing, PA-C  amLODipine (NORVASC) 2.5 MG tablet Take 2.5 mg by mouth daily. 08/27/21   [provider]  cyclobenzaprine (FLEXERIL) 10  MG tablet Take 10 mg by mouth every 8 (eight) hours as needed. 08/01/21   [provider]  montelukast (SINGULAIR) 10 MG tablet Take 10 mg by mouth daily. 09/06/19   [provider]  omeprazole (PRILOSEC) 20 MG capsule Take 20 mg by mouth daily. 09/08/18   [provider]  pregabalin (LYRICA) 75 MG capsule Take by mouth. 07/07/21   [provider]  SUMAtriptan (IMITREX) 50 MG tablet Take 50 mg by mouth  every 2 (two) hours as needed for migraine.  10/14/17   [provider]  traZODone (DESYREL) 150 MG tablet Take 150 mg by mouth at bedtime. 08/06/21   [provider]    Family History Family History  Problem Relation Age of Onset   Hypertension Mother    Diabetes Mother    Heart failure Father    Hypertension Father    Diabetes Father    Heart failure Other    Breast cancer Paternal Grandmother     Social History Social History   Tobacco Use   Smoking status: Never   Smokeless tobacco: Never  Vaping Use   Vaping Use: Never used  Substance Use Topics   Alcohol use: No   Drug use: No     Allergies   Patient has no known allergies.   Review of Systems Review of Systems Per HPI  Physical Exam Triage Vital Signs ED Triage Vitals  Enc Vitals Group     BP 10/02/21 1512 97/76     Pulse Rate 10/02/21 1512 93     Resp 10/02/21 1512 18     Temp 10/02/21 1512 98.5 F (36.9 C)     Temp Source 10/02/21 1512 Oral     SpO2 10/02/21 1512 100 %     Weight --      Height --      Head Circumference --      Peak Flow --      Pain Score 10/02/21 1420 4     Pain Loc --      Pain Edu? --      Excl. in [place]? --    No data found.  Updated Vital Signs BP 97/76 (BP Location: Right Arm)   Pulse 93   Temp 98.5 F (36.9 C) (Oral)   Resp 18   SpO2 100%   Visual Acuity Right Eye Distance:   Left Eye Distance:   Bilateral Distance:    Right Eye Near:   Left Eye Near:    Bilateral Near:     Physical Exam Vitals and nursing note reviewed.  Constitutional:      Appearance: Normal appearance. She is not ill-appearing.  HENT:     Head: Atraumatic.  Eyes:     Extraocular Movements: Extraocular movements intact.     Conjunctiva/sclera: Conjunctivae normal.  Cardiovascular:     Rate and Rhythm: Normal rate and regular rhythm.     Heart sounds: Normal heart sounds.  Pulmonary:     Effort: Pulmonary effort is normal.     Breath sounds: Normal breath  sounds.  Abdominal:     General: Bowel sounds are normal. There is no distension.     Palpations: Abdomen is soft.     Tenderness: There is no abdominal tenderness. There is no right CVA tenderness, left CVA tenderness or guarding.  Musculoskeletal:        General: Normal range of motion.     Cervical back: Normal range of motion and neck supple.  Skin:  General: Skin is warm and dry.  Neurological:     Mental Status: She is alert and oriented to person, place, and time.  Psychiatric:        Mood and Affect: Mood normal.        Thought Content: Thought content normal.        Judgment: Judgment normal.     UC Treatments / Results  Labs (all labs ordered are listed, but only abnormal results are displayed) Labs Reviewed  POCT URINALYSIS DIPSTICK, ED / UC - Abnormal; Notable for the following components:      Result Value   Hgb urine dipstick SMALL (*)    Protein, ur 100 (*)    Leukocytes,Ua MODERATE (*)    All other components within normal limits  URINE CULTURE    EKG   Radiology No results found.  Procedures Procedures (including critical care time)  Medications Ordered in UC Medications - No data to display  Initial Impression / Assessment and Plan / UC Course  I have reviewed the triage vital signs and the nursing notes.  Pertinent labs & imaging results that were available during my care of the patient were reviewed by me and considered in my medical decision making (see chart for details).     Urinalysis today showing 3+ leukocytes, urine culture pending we will treat with Bactrim given symptoms and urinalysis findings while awaiting culture.  Discussed to increase fluid intake, Azo as needed for symptom relief.  Adjust as needed based on urine culture results.  Final Clinical Impressions(s) / UC Diagnoses   Final diagnoses:  Acute lower UTI   Discharge Instructions   None    ED Prescriptions     Medication Sig Dispense Auth. Provider    sulfamethoxazole-trimethoprim (BACTRIM DS) 800-160 MG tablet Take 1 tablet by mouth 2 (two) times daily. 6 tablet Volney American, [place]      PDMP not reviewed this encounter.   Volney American, [place] 10/02/21 1704

## 2021-10-04 LAB — URINE CULTURE: Culture: 20000 — AB

## 2021-10-28 ENCOUNTER — Ambulatory Visit
Admission: EM | Admit: 2021-10-28 | Discharge: 2021-10-28 | Disposition: A | Discharge status: Home / Self Care | Payer: 59 | Attending: Urgent Care | Admitting: Urgent Care

## 2021-10-28 ENCOUNTER — Other Ambulatory Visit: Payer: Self-pay

## 2021-10-28 DIAGNOSIS — M545 Low back pain, unspecified: Secondary | ICD-10-CM | POA: Diagnosis not present

## 2021-10-28 DIAGNOSIS — Z9889 Other specified postprocedural states: Secondary | ICD-10-CM | POA: Diagnosis not present

## 2021-10-28 MED ORDER — PREDNISONE 20 MG PO TABS: ORAL_TABLET | ORAL | 0 refills | Status: DC

## 2021-10-28 MED ORDER — KETOROLAC TROMETHAMINE 60 MG/2ML IM SOLN: 60.0000 mg | Freq: Once | INTRAMUSCULAR | Status: DC

## 2021-10-28 MED ORDER — TIZANIDINE HCL 4 MG PO TABS: 4.0000 mg | ORAL_TABLET | Freq: Every day | ORAL | 0 refills | Status: DC

## 2021-10-28 NOTE — ED Provider Notes (Signed)
[facility]   MRN: [id number] DOB: [birth year]  Subjective:   Hannah Lynch is a 50 y.o. female presenting for an acute onset of moderate to severe left-sided low back pain.  Patient woke up with symptoms and progressively worsened throughout the day.  She did do a long car ride yesterday from [place].  She has a history of degenerative disc disease, lumbar radiculopathy that required surgery.  Since then she has episodes of back pain flareups that respond well to Toradol and if not steroids.  No weakness, numbness or tingling, shooting pains, fall, trauma, weakness, numbness or tingling, changes to bowel or urinary habits.  No current facility-administered medications for this encounter.  Current Outpatient Medications:    amLODipine (NORVASC) 2.5 MG tablet, Take 2.5 mg by mouth daily., Disp: , Rfl:    cyclobenzaprine (FLEXERIL) 10 MG tablet, Take 10 mg by mouth every 8 (eight) hours as needed., Disp: , Rfl:    montelukast (SINGULAIR) 10 MG tablet, Take 10 mg by mouth daily., Disp: , Rfl:    omeprazole (PRILOSEC) 20 MG capsule, Take 20 mg by mouth daily., Disp: , Rfl: 3   pregabalin (LYRICA) 75 MG capsule, Take by mouth., Disp: , Rfl:    sulfamethoxazole-trimethoprim (BACTRIM DS) 800-160 MG tablet, Take 1 tablet by mouth 2 (two) times daily., Disp: 6 tablet, Rfl: 0   SUMAtriptan (IMITREX) 50 MG tablet, Take 50 mg by mouth every 2 (two) hours as needed for migraine. , Disp: , Rfl:    traZODone (DESYREL) 150 MG tablet, Take 150 mg by mouth at bedtime., Disp: , Rfl:   Facility-Administered Medications Ordered in Other Encounters:    lactated ringers infusion, , , Continuous PRN, Allyson Sabal, Tabatha J, CRNA, Continued from Pre-op at 03/26/18 1215   No Known Allergies  Past Medical History:  Diagnosis Date   Anxiety    Degenerative disc disease, lumbar    Degenerative disc disease, lumbar 2012   lumbar and cervical region   Depression    Family history of adverse reaction  to anesthesia    pt's mother has hx. of post-op N/V   Headache    Hypothyroidism    IBS (irritable bowel syndrome)    Nasal septal deviation 11/2017   Nasal turbinate hypertrophy 11/2017   PONV (postoperative nausea and vomiting)    Snoring 11/2017     Past Surgical History:  Procedure Laterality Date   ABDOMINAL HYSTERECTOMY  2005   partial   ANTERIOR CERVICAL DECOMP/DISCECTOMY FUSION  03/25/2007   C4-5   CERVICAL FUSION  2013   CHOLECYSTECTOMY N/A 03/26/2018   Procedure: LAPAROSCOPIC CHOLECYSTECTOMY;  Surgeon: Franky Macho, MD;  Location: [facility];  Service: General;  Laterality: N/A;   KNEE ARTHROSCOPY Right    LAPAROSCOPIC GASTRIC SLEEVE RESECTION N/A 09/27/2019   Procedure: LAPAROSCOPIC GASTRIC SLEEVE RESECTION, HIATAL HERNIA REPARI ;Upper Endo, Eras Pathway;  Surgeon: Gaynelle Adu, MD;  Location: [facility];  Service: General;  Laterality: N/A;   LUMBAR LAMINECTOMY/DECOMPRESSION MICRODISCECTOMY  11/21/2004   L4-5   MAXIMUM ACCESS (MAS)POSTERIOR LUMBAR INTERBODY FUSION (PLIF) 1 LEVEL N/A 09/25/2014   Procedure: Lumbar four-five MAXIMUM ACCESS (MAS) POSTERIOR LUMBAR INTERBODY FUSION (PLIF) 1 LEVEL;  Surgeon: Maeola Harman, MD;  Location: [facility];  Service: Neurosurgery;  Laterality: N/A;   NASAL SEPTOPLASTY W/ TURBINOPLASTY N/A 12/07/2017   Procedure: NASAL SEPTOPLASTY WITH BILATERAL TURBINATE REDUCTION;  Surgeon: Christia Reading, MD;  Location: [facility];  Service: ENT;  Laterality: N/A;   ORIF ANKLE FRACTURE Right  TONSILLECTOMY  06/2018   UPPER GI ENDOSCOPY      Family History  Problem Relation Age of Onset   Hypertension Mother    Diabetes Mother    Heart failure Father    Hypertension Father    Diabetes Father    Heart failure Other    Breast cancer Paternal Grandmother     Social History   Tobacco Use   Smoking status: Never   Smokeless tobacco: Never  Vaping Use   Vaping Use: Never used  Substance Use Topics   Alcohol use: No   Drug use:  Never    ROS   Objective:   Vitals: BP 109/77 (BP Location: Right Arm)    Pulse 87    Temp 97.8 F (36.6 C) (Oral)    Resp 16    SpO2 98%   Physical Exam Constitutional:      General: She is not in acute distress.    Appearance: Normal appearance. She is well-developed. She is not ill-appearing, toxic-appearing or diaphoretic.  HENT:     Head: Normocephalic and atraumatic.     Nose: Nose normal.     Mouth/Throat:     Mouth: Mucous membranes are moist.     Pharynx: Oropharynx is clear.  Eyes:     General: No scleral icterus.       Right eye: No discharge.        Left eye: No discharge.     Extraocular Movements: Extraocular movements intact.     Pupils: Pupils are equal, round, and reactive to light.  Cardiovascular:     Rate and Rhythm: Normal rate.  Pulmonary:     Effort: Pulmonary effort is normal.  Musculoskeletal:     Comments: Full range of motion throughout.  Strength 5/5 for lower extremities.  Patient ambulates without any assistance at expected pace.  No ecchymosis, swelling, lacerations or abrasions.  Patient does have paraspinal muscle tenderness along the left lower back excluding the midline.    Skin:    General: Skin is warm and dry.  Neurological:     General: No focal deficit present.     Mental Status: She is alert and oriented to person, place, and time.     Motor: No weakness.     Coordination: Coordination normal.     Gait: Gait normal.     Deep Tendon Reflexes: Reflexes normal.  Psychiatric:        Mood and Affect: Mood normal.        Behavior: Behavior normal.        Thought Content: Thought content normal.    Assessment and Plan :   PDMP not reviewed this encounter.  1. Acute left-sided low back pain without sciatica   2. History of back surgery    IM Toradol in clinic, tizanidine as an outpatient.  Provided patient with a prescription for prednisone in the event that she continues to have symptoms.  Otherwise, she plans on not filling  the prednisone. Counseled patient on potential for adverse effects with medications prescribed/recommended today, ER and return-to-clinic precautions discussed, patient verbalized understanding.    Wallis Bamberg, PA-C 10/28/21 1727

## 2021-10-28 NOTE — ED Triage Notes (Signed)
Pt reports lower back pain since this morning.

## 2021-11-15 ENCOUNTER — Other Ambulatory Visit (HOSPITAL_COMMUNITY): Payer: Self-pay

## 2021-11-20 ENCOUNTER — Ambulatory Visit
Admission: EM | Admit: 2021-11-20 | Discharge: 2021-11-20 | Disposition: A | Discharge status: Home / Self Care | Payer: 59

## 2021-11-20 DIAGNOSIS — K529 Noninfective gastroenteritis and colitis, unspecified: Secondary | ICD-10-CM | POA: Diagnosis not present

## 2021-11-20 DIAGNOSIS — R197 Diarrhea, unspecified: Secondary | ICD-10-CM

## 2021-11-20 MED ORDER — LOPERAMIDE HCL 2 MG PO CAPS: 2.0000 mg | ORAL_CAPSULE | Freq: Two times a day (BID) | ORAL | 0 refills | Status: DC | PRN

## 2021-11-20 MED ORDER — ONDANSETRON 8 MG PO TBDP
8.0000 mg | ORAL_TABLET | Freq: Once | ORAL | Status: AC
  Administered 2021-11-20: 8 mg via ORAL

## 2021-11-20 NOTE — ED Provider Notes (Signed)
[facility]   MRN: [id number] DOB: 07-Sep-1971  Subjective:   Hannah Lynch is a 51 y.o. female presenting for 3-day history of acute onset persistent profuse diarrhea.  Symptoms started after she ate takeout on something.  States that normally she does not need any take, but feels like this is the primary source of her symptoms.  No sick contacts to her knowledge.  However, she did find out that there were multiple people that got sick from eating the same place.  No fevers, nausea, vomiting, bloody stools.  No history of ulcerative colitis, Crohn disease or GI disorders.  No recent hospitalizations, long distance travel, antibiotic use.  No current facility-administered medications for this encounter.  Current Outpatient Medications:    amLODipine (NORVASC) 2.5 MG tablet, Take 2.5 mg by mouth daily., Disp: , Rfl:    cyclobenzaprine (FLEXERIL) 10 MG tablet, Take 10 mg by mouth every 8 (eight) hours as needed., Disp: , Rfl:    montelukast (SINGULAIR) 10 MG tablet, Take 10 mg by mouth daily., Disp: , Rfl:    omeprazole (PRILOSEC) 20 MG capsule, Take 20 mg by mouth daily., Disp: , Rfl: 3   predniSONE (DELTASONE) 20 MG tablet, Take 2 tablets daily with breakfast., Disp: 10 tablet, Rfl: 0   pregabalin (LYRICA) 75 MG capsule, Take by mouth., Disp: , Rfl:    sulfamethoxazole-trimethoprim (BACTRIM DS) 800-160 MG tablet, Take 1 tablet by mouth 2 (two) times daily., Disp: 6 tablet, Rfl: 0   SUMAtriptan (IMITREX) 50 MG tablet, Take 50 mg by mouth every 2 (two) hours as needed for migraine. , Disp: , Rfl:    tiZANidine (ZANAFLEX) 4 MG tablet, Take 1 tablet (4 mg total) by mouth at bedtime., Disp: 30 tablet, Rfl: 0   topiramate (TOPAMAX) 25 MG tablet, TAKE 1 TABLET BY MOUTH DAILY FOR 1 WEEK, THEN 1 TABLET TWICE DAILY FOR 1 WEEK, THEN 1 TABLET IN THE MORNING AND 2 TABLETS IN THE EVENING FOR 1 WEEK, THEN 2 TABLETS IN THE MORNING AND 2 TABLETS IN THE EVENING THEREAFTER., Disp: , Rfl:     traZODone (DESYREL) 150 MG tablet, Take 150 mg by mouth at bedtime., Disp: , Rfl:   Facility-Administered Medications Ordered in Other Encounters:    lactated ringers infusion, , , Continuous PRN, Gwenlyn Found, Tabatha J, CRNA, Continued from Pre-op at 03/26/18 1215   No Known Allergies  Past Medical History:  Diagnosis Date   Anxiety    Degenerative disc disease, lumbar    Degenerative disc disease, lumbar 2012   lumbar and cervical region   Depression    Family history of adverse reaction to anesthesia    pt's mother has hx. of post-op N/V   Headache    Hypothyroidism    IBS (irritable bowel syndrome)    Nasal septal deviation 11/2017   Nasal turbinate hypertrophy 11/2017   PONV (postoperative nausea and vomiting)    Snoring 11/2017     Past Surgical History:  Procedure Laterality Date   ABDOMINAL HYSTERECTOMY  2005   partial   ANTERIOR CERVICAL DECOMP/DISCECTOMY FUSION  03/25/2007   C4-5   CERVICAL FUSION  2013   CHOLECYSTECTOMY N/A 03/26/2018   Procedure: LAPAROSCOPIC CHOLECYSTECTOMY;  Surgeon: Aviva Signs, MD;  Location: [facility];  Service: General;  Laterality: N/A;   KNEE ARTHROSCOPY Right    LAPAROSCOPIC GASTRIC SLEEVE RESECTION N/A 09/27/2019   Procedure: LAPAROSCOPIC GASTRIC SLEEVE RESECTION, HIATAL HERNIA REPARI ;Upper Endo, Eras Pathway;  Surgeon: Greer Pickerel, MD;  Location: [facility];  Service: General;  Laterality: N/A;   LUMBAR LAMINECTOMY/DECOMPRESSION MICRODISCECTOMY  11/21/2004   L4-5   MAXIMUM ACCESS (MAS)POSTERIOR LUMBAR INTERBODY FUSION (PLIF) 1 LEVEL N/A 09/25/2014   Procedure: Lumbar four-five MAXIMUM ACCESS (MAS) POSTERIOR LUMBAR INTERBODY FUSION (PLIF) 1 LEVEL;  Surgeon: Erline Levine, MD;  Location: [facility];  Service: Neurosurgery;  Laterality: N/A;   NASAL SEPTOPLASTY W/ TURBINOPLASTY N/A 12/07/2017   Procedure: NASAL SEPTOPLASTY WITH BILATERAL TURBINATE REDUCTION;  Surgeon: Melida Quitter, MD;  Location: [facility];  Service: ENT;   Laterality: N/A;   ORIF ANKLE FRACTURE Right    TONSILLECTOMY  06/2018   UPPER GI ENDOSCOPY      Family History  Problem Relation Age of Onset   Hypertension Mother    Diabetes Mother    Heart failure Father    Hypertension Father    Diabetes Father    Heart failure Other    Breast cancer Paternal Grandmother     Social History   Tobacco Use   Smoking status: Never   Smokeless tobacco: Never  Vaping Use   Vaping Use: Never used  Substance Use Topics   Alcohol use: No   Drug use: Never    ROS   Objective:   Vitals: BP 114/82 (BP Location: Right Arm)    Pulse 94    Temp 98.2 F (36.8 C) (Oral)    Resp 18    SpO2 97%   Physical Exam Constitutional:      General: She is not in acute distress.    Appearance: Normal appearance. She is well-developed. She is not ill-appearing, toxic-appearing or diaphoretic.  HENT:     Head: Normocephalic and atraumatic.     Nose: Nose normal.     Mouth/Throat:     Mouth: Mucous membranes are moist.  Eyes:     Extraocular Movements: Extraocular movements intact.     Conjunctiva/sclera: Conjunctivae normal.  Cardiovascular:     Rate and Rhythm: Normal rate and regular rhythm.     Pulses: Normal pulses.     Heart sounds: Normal heart sounds. No murmur heard.   No friction rub. No gallop.  Pulmonary:     Effort: Pulmonary effort is normal. No respiratory distress.     Breath sounds: Normal breath sounds. No stridor. No wheezing, rhonchi or rales.  Abdominal:     General: There is no distension.     Palpations: Abdomen is soft. There is no mass.     Tenderness: There is no abdominal tenderness. There is no right CVA tenderness, left CVA tenderness, guarding or rebound.     Comments: Hyperactive bowel sounds.  Skin:    General: Skin is warm and dry.     Findings: No rash.  Neurological:     Mental Status: She is alert and oriented to person, place, and time.  Psychiatric:        Mood and Affect: Mood normal.        Behavior:  Behavior normal.        Thought Content: Thought content normal.      Assessment and Plan :   PDMP not reviewed this encounter.  1. Colitis   2. Diarrhea, unspecified type    Will manage for suspected viral colitis with supportive care.  Recommended patient hydrate well, eat light meals and maintain electrolytes.  Will use Zofran and Imodium for nausea, vomiting and diarrhea.  No signs of an acute abdomen.  Low suspicion for severe infectious colitis.  Counseled patient on  potential for adverse effects with medications prescribed/recommended today, ER and return-to-clinic precautions discussed, patient verbalized understanding.    Jaynee Eagles, [place] 11/20/21 [phone]

## 2021-11-20 NOTE — ED Triage Notes (Signed)
Patient states she has diarrhea since Monday morning.  Patient states she took Pepto for symptoms but no relief  Patient states she had a Covid Test and it was negative

## 2021-11-20 NOTE — Discharge Instructions (Addendum)

## 2021-11-22 ENCOUNTER — Other Ambulatory Visit (HOSPITAL_COMMUNITY): Payer: Self-pay

## 2021-11-22 MED ORDER — CYCLOBENZAPRINE HCL 10 MG PO TABS
ORAL_TABLET | ORAL | 0 refills | Status: DC
  Filled 2021-11-22 – 2021-11-23 (×2): qty 60, 20d supply, fill #0

## 2021-11-22 MED ORDER — MONTELUKAST SODIUM 10 MG PO TABS
ORAL_TABLET | ORAL | 2 refills | Status: DC
  Filled 2021-11-22 – 2021-11-23 (×2): qty 90, 90d supply, fill #0

## 2021-11-22 MED ORDER — LEVOTHYROXINE SODIUM 25 MCG PO TABS
25.0000 ug | ORAL_TABLET | Freq: Every day | ORAL | 2 refills | Status: DC
  Filled 2021-11-22 – 2021-11-23 (×2): qty 90, 90d supply, fill #0
  Filled 2022-02-06: qty 90, 90d supply, fill #1
  Filled 2022-05-16: qty 90, 90d supply, fill #2

## 2021-11-23 ENCOUNTER — Other Ambulatory Visit (HOSPITAL_COMMUNITY): Payer: Self-pay

## 2021-12-12 ENCOUNTER — Other Ambulatory Visit (HOSPITAL_COMMUNITY): Payer: Self-pay

## 2021-12-12 MED ORDER — TOPIRAMATE 25 MG PO TABS
ORAL_TABLET | ORAL | 1 refills | Status: DC
  Filled 2021-12-12: qty 120, 30d supply, fill #0
  Filled 2022-01-08: qty 120, 30d supply, fill #1

## 2021-12-19 ENCOUNTER — Ambulatory Visit
Admission: EM | Admit: 2021-12-19 | Discharge: 2021-12-19 | Disposition: A | Discharge status: Home / Self Care | Payer: 59 | Attending: Urgent Care | Admitting: Urgent Care

## 2021-12-19 ENCOUNTER — Other Ambulatory Visit: Payer: Self-pay

## 2021-12-19 DIAGNOSIS — I10 Essential (primary) hypertension: Secondary | ICD-10-CM

## 2021-12-19 DIAGNOSIS — R0982 Postnasal drip: Secondary | ICD-10-CM

## 2021-12-19 DIAGNOSIS — R052 Subacute cough: Secondary | ICD-10-CM

## 2021-12-19 DIAGNOSIS — B349 Viral infection, unspecified: Secondary | ICD-10-CM | POA: Diagnosis not present

## 2021-12-19 MED ORDER — PROMETHAZINE-DM 6.25-15 MG/5ML PO SYRP: 5.0000 mL | ORAL_SOLUTION | Freq: Every evening | ORAL | 0 refills | Status: DC | PRN

## 2021-12-19 MED ORDER — PSEUDOEPHEDRINE HCL 30 MG PO TABS: 30.0000 mg | ORAL_TABLET | Freq: Three times a day (TID) | ORAL | 0 refills | Status: DC | PRN

## 2021-12-19 MED ORDER — BENZONATATE 100 MG PO CAPS: 100.0000 mg | ORAL_CAPSULE | Freq: Three times a day (TID) | ORAL | 0 refills | Status: DC | PRN

## 2021-12-19 MED ORDER — CETIRIZINE HCL 10 MG PO TABS: 10.0000 mg | ORAL_TABLET | Freq: Every day | ORAL | 0 refills | Status: DC

## 2021-12-19 NOTE — ED Triage Notes (Signed)
Pt reports scratchy throat chills coughing and sneezing x days.

## 2021-12-19 NOTE — ED Provider Notes (Signed)
[facility]   MRN: [id number] DOB: [birth year]  Subjective:   Hannah Lynch is a 51 y.o. female presenting for 2-3 day history of acute onset sneezing, malaise, chills, scratchy throat, bilateral ear fullness. Has a history of nasal septal deviation. Is not opposed to a COVID test. No chest pain, fever, shob. No smoking. No history of asthma.   No current facility-administered medications for this encounter.  Current Outpatient Medications:    amLODipine (NORVASC) 2.5 MG tablet, Take 2.5 mg by mouth daily., Disp: , Rfl:    cyclobenzaprine (FLEXERIL) 10 MG tablet, Take 10 mg by mouth every 8 (eight) hours as needed., Disp: , Rfl:    cyclobenzaprine (FLEXERIL) 10 MG tablet, TAKE 1 TABLET BY MOUTH EVERY 8 HOURS AS NEEDED FOR MUSCLE SPASM, Disp: 60 tablet, Rfl: 0   levothyroxine (SYNTHROID) 25 MCG tablet, Take 1 tablet by mouth once daily, Disp: 90 tablet, Rfl: 2   loperamide (IMODIUM) 2 MG capsule, Take 1 capsule (2 mg total) by mouth 2 (two) times daily as needed for diarrhea or loose stools., Disp: 14 capsule, Rfl: 0   montelukast (SINGULAIR) 10 MG tablet, Take 10 mg by mouth daily., Disp: , Rfl:    montelukast (SINGULAIR) 10 MG tablet, Take one tablet (10 mg dose) by mouth daily., Disp: 90 tablet, Rfl: 2   omeprazole (PRILOSEC) 20 MG capsule, Take 20 mg by mouth daily., Disp: , Rfl: 3   predniSONE (DELTASONE) 20 MG tablet, Take 2 tablets daily with breakfast., Disp: 10 tablet, Rfl: 0   pregabalin (LYRICA) 75 MG capsule, Take by mouth., Disp: , Rfl:    sulfamethoxazole-trimethoprim (BACTRIM DS) 800-160 MG tablet, Take 1 tablet by mouth 2 (two) times daily., Disp: 6 tablet, Rfl: 0   SUMAtriptan (IMITREX) 50 MG tablet, Take 50 mg by mouth every 2 (two) hours as needed for migraine. , Disp: , Rfl:    tiZANidine (ZANAFLEX) 4 MG tablet, Take 1 tablet (4 mg total) by mouth at bedtime., Disp: 30 tablet, Rfl: 0   topiramate (TOPAMAX) 25 MG tablet, TAKE 1 TABLET BY MOUTH DAILY FOR 1  WEEK, THEN 1 TABLET TWICE DAILY FOR 1 WEEK, THEN 1 TABLET IN THE MORNING AND 2 TABLETS IN THE EVENING FOR 1 WEEK, THEN 2 TABLETS IN THE MORNING AND 2 TABLETS IN THE EVENING THEREAFTER., Disp: , Rfl:    topiramate (TOPAMAX) 25 MG tablet, Take 1 tablet daily x 1 week, then 1 tablet twice daily x 1 week, then one tablet in the am and 2 tablets in pm x 1 week, then 2 tablets in the am and 2 tablets in the pm thereafter., Disp: 120 tablet, Rfl: 1   traZODone (DESYREL) 150 MG tablet, Take 150 mg by mouth at bedtime., Disp: , Rfl:   Facility-Administered Medications Ordered in Other Encounters:    lactated ringers infusion, , , Continuous PRN, Allyson Sabal, Tabatha J, CRNA, Continued from Pre-op at 03/26/18 1215   No Known Allergies  Past Medical History:  Diagnosis Date   Anxiety    Degenerative disc disease, lumbar    Degenerative disc disease, lumbar 2012   lumbar and cervical region   Depression    Family history of adverse reaction to anesthesia    pt's mother has hx. of post-op N/V   Headache    Hypothyroidism    IBS (irritable bowel syndrome)    Nasal septal deviation 11/2017   Nasal turbinate hypertrophy 11/2017   PONV (postoperative nausea and vomiting)    Snoring 11/2017  Past Surgical History:  Procedure Laterality Date   ABDOMINAL HYSTERECTOMY  2005   partial   ANTERIOR CERVICAL DECOMP/DISCECTOMY FUSION  03/25/2007   C4-5   CERVICAL FUSION  2013   CHOLECYSTECTOMY N/A 03/26/2018   Procedure: LAPAROSCOPIC CHOLECYSTECTOMY;  Surgeon: Franky Macho, MD;  Location: [facility];  Service: General;  Laterality: N/A;   KNEE ARTHROSCOPY Right    LAPAROSCOPIC GASTRIC SLEEVE RESECTION N/A 09/27/2019   Procedure: LAPAROSCOPIC GASTRIC SLEEVE RESECTION, HIATAL HERNIA REPARI ;Upper Endo, Eras Pathway;  Surgeon: Gaynelle Adu, MD;  Location: [facility];  Service: General;  Laterality: N/A;   LUMBAR LAMINECTOMY/DECOMPRESSION MICRODISCECTOMY  11/21/2004   L4-5   MAXIMUM ACCESS (MAS)POSTERIOR LUMBAR  INTERBODY FUSION (PLIF) 1 LEVEL N/A 09/25/2014   Procedure: Lumbar four-five MAXIMUM ACCESS (MAS) POSTERIOR LUMBAR INTERBODY FUSION (PLIF) 1 LEVEL;  Surgeon: Maeola Harman, MD;  Location: [facility];  Service: Neurosurgery;  Laterality: N/A;   NASAL SEPTOPLASTY W/ TURBINOPLASTY N/A 12/07/2017   Procedure: NASAL SEPTOPLASTY WITH BILATERAL TURBINATE REDUCTION;  Surgeon: Christia Reading, MD;  Location: [facility];  Service: ENT;  Laterality: N/A;   ORIF ANKLE FRACTURE Right    TONSILLECTOMY  06/2018   UPPER GI ENDOSCOPY      Family History  Problem Relation Age of Onset   Hypertension Mother    Diabetes Mother    Heart failure Father    Hypertension Father    Diabetes Father    Heart failure Other    Breast cancer Paternal Grandmother     Social History   Tobacco Use   Smoking status: Never   Smokeless tobacco: Never  Vaping Use   Vaping Use: Never used  Substance Use Topics   Alcohol use: No   Drug use: Never    ROS   Objective:   Vitals: BP (!) 126/91 (BP Location: Right Arm)    Pulse 85    Temp 97.7 F (36.5 C) (Oral)    Resp 18    SpO2 99%   Physical Exam Constitutional:      General: She is not in acute distress.    Appearance: Normal appearance. She is well-developed and normal weight. She is not ill-appearing, toxic-appearing or diaphoretic.  HENT:     Head: Normocephalic and atraumatic.     Right Ear: Ear canal and external ear normal. No drainage or tenderness. No middle ear effusion. There is no impacted cerumen. Tympanic membrane is not erythematous.     Left Ear: Ear canal and external ear normal. No drainage or tenderness.  No middle ear effusion. There is no impacted cerumen. Tympanic membrane is not erythematous.     Ears:     Comments: TMs opacified bilaterally.    Nose: Nose normal. No congestion or rhinorrhea.     Mouth/Throat:     Mouth: Mucous membranes are moist. No oral lesions.     Pharynx: No pharyngeal swelling, oropharyngeal  exudate, posterior oropharyngeal erythema or uvula swelling.     Tonsils: No tonsillar exudate or tonsillar abscesses.  Eyes:     General: No scleral icterus.       Right eye: No discharge.        Left eye: No discharge.     Extraocular Movements: Extraocular movements intact.     Right eye: Normal extraocular motion.     Left eye: Normal extraocular motion.     Conjunctiva/sclera: Conjunctivae normal.  Cardiovascular:     Rate and Rhythm: Normal rate.     Heart sounds: No  murmur heard.   No friction rub. No gallop.  Pulmonary:     Effort: Pulmonary effort is normal. No respiratory distress.     Breath sounds: No stridor. No wheezing, rhonchi or rales.  Chest:     Chest wall: No tenderness.  Musculoskeletal:     Cervical back: Normal range of motion and neck supple.  Lymphadenopathy:     Cervical: No cervical adenopathy.  Skin:    General: Skin is warm and dry.  Neurological:     General: No focal deficit present.     Mental Status: She is alert and oriented to person, place, and time.  Psychiatric:        Mood and Affect: Mood normal.        Behavior: Behavior normal.    Assessment and Plan :   PDMP not reviewed this encounter.  1. Acute viral syndrome   2. Subacute cough   3. Post-nasal drainage   4. Essential hypertension    Deferred imaging given clear cardiopulmonary exam, hemodynamically stable vital signs. Will manage for viral illness such as viral URI, viral syndrome, viral rhinitis, COVID-19. Recommended supportive care. Offered scripts for symptomatic relief. Testing is pending. Counseled patient on potential for adverse effects with medications prescribed/recommended today, ER and return-to-clinic precautions discussed, patient verbalized understanding.     Wallis Bamberg, [place] 12/19/21 1727

## 2021-12-20 LAB — NOVEL CORONAVIRUS, NAA: SARS-CoV-2, NAA: NOT DETECTED

## 2022-01-08 ENCOUNTER — Other Ambulatory Visit (HOSPITAL_COMMUNITY): Payer: Self-pay

## 2022-01-25 ENCOUNTER — Other Ambulatory Visit (HOSPITAL_COMMUNITY): Payer: Self-pay

## 2022-01-27 ENCOUNTER — Other Ambulatory Visit (HOSPITAL_COMMUNITY): Payer: Self-pay

## 2022-01-27 MED ORDER — HYOSCYAMINE SULFATE 0.125 MG PO TBDP
ORAL_TABLET | ORAL | 1 refills | Status: DC
  Filled 2022-01-27: qty 120, 30d supply, fill #0

## 2022-01-27 MED ORDER — CYCLOBENZAPRINE HCL 10 MG PO TABS
ORAL_TABLET | ORAL | 0 refills | Status: DC
  Filled 2022-01-27: qty 60, 20d supply, fill #0

## 2022-01-28 ENCOUNTER — Other Ambulatory Visit (HOSPITAL_COMMUNITY): Payer: Self-pay

## 2022-01-29 ENCOUNTER — Other Ambulatory Visit (HOSPITAL_COMMUNITY): Payer: Self-pay

## 2022-02-05 ENCOUNTER — Ambulatory Visit (INDEPENDENT_AMBULATORY_CARE_PROVIDER_SITE_OTHER): Payer: 59

## 2022-02-05 ENCOUNTER — Ambulatory Visit (INDEPENDENT_AMBULATORY_CARE_PROVIDER_SITE_OTHER): Payer: 59 | Admitting: Orthopaedic Surgery

## 2022-02-05 ENCOUNTER — Encounter: Payer: Self-pay | Admitting: Orthopaedic Surgery

## 2022-02-05 ENCOUNTER — Other Ambulatory Visit: Payer: Self-pay

## 2022-02-05 VITALS — Ht 64.0 in | Wt 170.0 lb

## 2022-02-05 DIAGNOSIS — M25562 Pain in left knee: Secondary | ICD-10-CM

## 2022-02-05 DIAGNOSIS — G8929 Other chronic pain: Secondary | ICD-10-CM

## 2022-02-05 MED ORDER — LIDOCAINE HCL 1 % IJ SOLN
2.0000 mL | INTRAMUSCULAR | Status: AC | PRN
  Administered 2022-02-05: 2 mL

## 2022-02-05 MED ORDER — METHYLPREDNISOLONE ACETATE 40 MG/ML IJ SUSP
80.0000 mg | INTRAMUSCULAR | Status: AC | PRN
  Administered 2022-02-05: 80 mg via INTRA_ARTICULAR

## 2022-02-05 MED ORDER — BUPIVACAINE HCL 0.25 % IJ SOLN
2.0000 mL | INTRAMUSCULAR | Status: AC | PRN
  Administered 2022-02-05: 2 mL

## 2022-02-05 NOTE — Progress Notes (Signed)
? ?Office Visit Note ?  ?Patient: Hannah Lynch           ?Date of Birth: 06/19/1971           ?MRN: [id number] ?Visit Date: 02/05/2022 ?             ?Requested by: Eartha InchBadger, Michael C, MD ?[address] ?[place] [id number] ?PCP: Eartha InchBadger, Michael C, MD ? ? ?Assessment & Plan: ?Visit Diagnoses:  ?1. Chronic pain of left knee   ? ? ?Plan: Hannah Lynch has been evaluated multiple occasions in the past for the problems referable to her right knee.  She has had prior arthroscopy several years ago with some degenerative changes noted both by arthroscopy and by MRI scan prior to the surgery.  She had a recent fall and notes that she has had an exacerbation of her knee pain.  I did see her about a year ago and injected the knee with cortisone and made a difference.  Films today revealed minimal change from those performed in 2021 with minimal degenerative changes.  Knee exam was relatively benign.  I suspect her present pain is related to the arthritis and will inject the knee with cortisone and monitor response ? ?Follow-Up Instructions: Return if symptoms worsen or fail to improve.  ? ?Orders:  ?Orders Placed This Encounter  ?Procedures  ?? XR KNEE 3 VIEW LEFT  ? ?No orders of the defined types were placed in this encounter. ? ? ? ? Procedures: ?Large Joint Inj: L knee on 02/05/2022 9:44 AM ?Indications: pain and diagnostic evaluation ?Details: 25 G 1.5 in needle, anteromedial approach ? ?Arthrogram: No ? ?Medications: 2 mL lidocaine 1 %; 80 mg methylPREDNISolone acetate 40 MG/ML; 2 mL bupivacaine 0.25 % ?Procedure, treatment alternatives, risks and benefits explained, specific risks discussed. Consent was given by the patient. Patient was prepped and draped in the usual sterile fashion.  ? ? ? ? ?Clinical Data: ?No additional findings. ? ? ?Subjective: ?Chief Complaint  ?Patient presents with  ?? Left Knee - Pain  ?Patient presents today for recurrent left knee pain. She states that she fell at the end of February and  not sure how she fell. She has been having increased pain laterally since the fall. It has been just over a year since her last left knee cortisone injection. She is not taking anything for pain.  ?Has lost about 35 pounds over the last several months.  She is being evaluated by the gastroenterologist ? ?HPI ? ?Review of Systems ? ? ?Objective: ?Vital Signs: Ht 5\' 4"  (1.626 m)   Wt 170 lb (77.1 kg)   BMI 29.18 kg/m?  ? ?Physical Exam ?Constitutional:   ?   Appearance: She is well-developed.  ?Eyes:  ?   Pupils: Pupils are equal, round, and reactive to light.  ?Pulmonary:  ?   Effort: Pulmonary effort is normal.  ?Skin: ?   General: Skin is warm and dry.  ?Neurological:  ?   Mental Status: She is alert and oriented to person, place, and time.  ?Psychiatric:     ?   Behavior: Behavior normal.  ? ? ?Ortho Exam left was not hot red warm or swollen.  No effusion.  Minimal medial joint pain anteriorly.  No pain posteriorly or pain laterally.  No patella pain with compression.  Full extension and flex well over 105 degrees without instability.  No popliteal pain.  No calf pain.  No pain with range of motion of right  or left hip ? ?Specialty Comments:  ?No specialty comments available. ? ?Imaging: ?XR KNEE 3 VIEW LEFT ? ?Result Date: 02/05/2022 ?Films of the left knee were obtained in 3 projections standing and compared to films performed of the same knee in 2021.  Minimal change.  There is very small osteophyte formation in the lateral compartment at the patellofemoral joint but no significant narrowing.  There is a little bit of a lateral patella tilt.  No ectopic calcification or acute change.  Films are consistent with very minimal changes of osteoarthritis  ? ? ?PMFS History: ?Patient Active Problem List  ? Diagnosis Date Noted  ?? Trochanteric bursitis, left hip 08/30/2020  ?? 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 Pain in left knee 10/12/2019  ?? Obesity (BMI 30-39.9) 09/27/2019  ??  GERD (gastroesophageal reflux disease) 09/27/2019  ?? Essential hypertension 09/27/2019  ?? Fatty liver 09/27/2019  ?? Metabolic syndrome 09/27/2019  ?? Pain in right ankle and joints of right foot 04/13/2019  ?? Chronic cholecystitis   ?? Primary insomnia 06/25/2017  ?? Papanicolaou smear for cervical cancer screening 06/25/2017  ?? Bilateral hearing loss 06/25/2017  ?? Acute serous otitis media 06/25/2017  ?? Lumbar stenosis with neurogenic claudication 09/25/2014  ? ?Past Medical History:  ?Diagnosis Date  ?? Anxiety   ?? Degenerative disc disease, lumbar   ?? Degenerative disc disease, lumbar 2012  ? lumbar and cervical region  ?? Depression   ?? Family history of adverse reaction to anesthesia   ? pt's mother has hx. of post-op N/V  ?? Headache   ?? Hypothyroidism   ?? IBS (irritable bowel syndrome)   ?? Nasal septal deviation 11/2017  ?? Nasal turbinate hypertrophy 11/2017  ?? PONV (postoperative nausea and vomiting)   ?? Snoring 11/2017  ?  ?Family History  ?Problem Relation Age of Onset  ?? Hypertension Mother   ?? Diabetes Mother   ?? Heart failure Father   ?? Hypertension Father   ?? Diabetes Father   ?? Heart failure Other   ?? Breast cancer Paternal Grandmother   ?  ?Past Surgical History:  ?Procedure Laterality Date  ?? ABDOMINAL HYSTERECTOMY  2005  ? partial  ?? ANTERIOR CERVICAL DECOMP/DISCECTOMY FUSION  03/25/2007  ? C4-5  ?? CERVICAL FUSION  2013  ?? CHOLECYSTECTOMY N/A 03/26/2018  ? Procedure: LAPAROSCOPIC CHOLECYSTECTOMY;  Surgeon: Franky Macho, MD;  Location: [facility];  Service: General;  Laterality: N/A;  ?? KNEE ARTHROSCOPY Right   ?? LAPAROSCOPIC GASTRIC SLEEVE RESECTION N/A 09/27/2019  ? Procedure: LAPAROSCOPIC GASTRIC SLEEVE RESECTION, HIATAL HERNIA REPARI ;Upper Endo, Eras Pathway;  Surgeon: Gaynelle Adu, MD;  Location: [facility];  Service: General;  Laterality: N/A;  ?? LUMBAR LAMINECTOMY/DECOMPRESSION MICRODISCECTOMY  11/21/2004  ? L4-5  ?? MAXIMUM ACCESS (MAS)POSTERIOR LUMBAR INTERBODY FUSION  (PLIF) 1 LEVEL N/A 09/25/2014  ? Procedure: Lumbar four-five MAXIMUM ACCESS (MAS) POSTERIOR LUMBAR INTERBODY FUSION (PLIF) 1 LEVEL;  Surgeon: Maeola Harman, MD;  Location: [facility];  Service: Neurosurgery;  Laterality: N/A;  ?? NASAL SEPTOPLASTY W/ TURBINOPLASTY N/A 12/07/2017  ? Procedure: NASAL SEPTOPLASTY WITH BILATERAL TURBINATE REDUCTION;  Surgeon: Christia Reading, MD;  Location: [facility];  Service: ENT;  Laterality: N/A;  ?? ORIF ANKLE FRACTURE Right   ?? TONSILLECTOMY  06/2018  ?? UPPER GI ENDOSCOPY    ? ?Social History  ? ?Occupational History  ?? Not on file  ?Tobacco Use  ?? Smoking status: Never  ?? Smokeless tobacco: Never  ?Vaping  Use  ?? Vaping Use: Never used  ?Substance and Sexual Activity  ?? Alcohol use: No  ?? Drug use: Never  ?? Sexual activity: Yes  ?  Birth control/protection: None  ? ? ? ? ? ? ?

## 2022-02-06 ENCOUNTER — Other Ambulatory Visit (HOSPITAL_COMMUNITY): Payer: Self-pay

## 2022-02-06 MED ORDER — TOPIRAMATE 25 MG PO TABS
ORAL_TABLET | ORAL | 1 refills | Status: DC
  Filled 2022-02-06: qty 120, 30d supply, fill #0
  Filled 2022-03-15: qty 120, 30d supply, fill #1

## 2022-02-06 MED ORDER — METOPROLOL SUCCINATE ER 25 MG PO TB24
ORAL_TABLET | ORAL | 3 refills | Status: DC
  Filled 2022-02-06: qty 90, 90d supply, fill #0
  Filled 2022-05-16: qty 90, 90d supply, fill #1
  Filled 2022-08-13: qty 90, 90d supply, fill #2
  Filled 2022-11-22: qty 90, 90d supply, fill #3

## 2022-02-06 MED ORDER — PANTOPRAZOLE SODIUM 40 MG PO TBEC
DELAYED_RELEASE_TABLET | ORAL | 3 refills | Status: DC
  Filled 2022-02-06: qty 90, 90d supply, fill #0
  Filled 2022-05-16: qty 90, 90d supply, fill #1

## 2022-02-22 ENCOUNTER — Telehealth: Payer: Self-pay | Admitting: Urgent Care

## 2022-02-22 MED ORDER — FLUTICASONE PROPIONATE 50 MCG/ACT NA SUSP: 2.0000 | Freq: Every day | NASAL | 12 refills | Status: DC

## 2022-02-22 MED ORDER — CETIRIZINE HCL 10 MG PO TABS: 10.0000 mg | ORAL_TABLET | Freq: Every day | ORAL | 0 refills | Status: DC

## 2022-02-22 MED ORDER — PSEUDOEPHEDRINE HCL 60 MG PO TABS: 60.0000 mg | ORAL_TABLET | Freq: Three times a day (TID) | ORAL | 0 refills | Status: DC | PRN

## 2022-02-22 MED ORDER — AMOXICILLIN 875 MG PO TABS: 875.0000 mg | ORAL_TABLET | Freq: Two times a day (BID) | ORAL | 0 refills | Status: DC

## 2022-02-22 NOTE — Telephone Encounter (Signed)
Patient experiencing 2-day history of severe right ear pain.  Has a history of allergies.  Needs to have her Zyrtec, Flonase, pseudoephedrine refilled.  Recommended amoxicillin to help with otitis media of the right ear. ?

## 2022-02-27 ENCOUNTER — Other Ambulatory Visit: Payer: Self-pay

## 2022-02-27 ENCOUNTER — Telehealth: Payer: Self-pay | Admitting: Urgent Care

## 2022-02-27 ENCOUNTER — Encounter: Payer: Self-pay | Admitting: Orthopaedic Surgery

## 2022-02-27 DIAGNOSIS — G8929 Other chronic pain: Secondary | ICD-10-CM

## 2022-02-27 MED ORDER — PREDNISONE 20 MG PO TABS: ORAL_TABLET | ORAL | 0 refills | Status: DC

## 2022-02-27 NOTE — Telephone Encounter (Signed)
Patient continues to have ear pain and no signs of cerumen impaction. She is on Augmentin, allergy medications. Will use a round of prednisone to help with suspected eustachian tube dysfunction.  ?

## 2022-02-28 ENCOUNTER — Other Ambulatory Visit (HOSPITAL_COMMUNITY): Payer: Self-pay

## 2022-02-28 MED ORDER — TRAZODONE HCL 150 MG PO TABS
ORAL_TABLET | ORAL | 1 refills | Status: DC
  Filled 2022-02-28: qty 90, 90d supply, fill #0
  Filled 2022-05-16: qty 90, 90d supply, fill #1

## 2022-02-28 MED ORDER — MONTELUKAST SODIUM 10 MG PO TABS
ORAL_TABLET | ORAL | 2 refills | Status: DC
  Filled 2022-02-28: qty 90, 90d supply, fill #0
  Filled 2022-06-08: qty 90, 90d supply, fill #1
  Filled 2022-09-02: qty 90, 90d supply, fill #2

## 2022-03-05 ENCOUNTER — Encounter: Payer: Self-pay | Admitting: Obstetrics and Gynecology

## 2022-03-05 ENCOUNTER — Other Ambulatory Visit (HOSPITAL_COMMUNITY)
Admission: RE | Admit: 2022-03-05 | Discharge: 2022-03-05 | Disposition: A | Discharge status: Home / Self Care | Payer: 59 | Source: Ambulatory Visit | Attending: Women's Health | Admitting: Women's Health

## 2022-03-05 ENCOUNTER — Ambulatory Visit
Admission: RE | Admit: 2022-03-05 | Discharge: 2022-03-05 | Disposition: A | Discharge status: Home / Self Care | Payer: 59 | Source: Ambulatory Visit | Attending: Orthopaedic Surgery | Admitting: Orthopaedic Surgery

## 2022-03-05 ENCOUNTER — Ambulatory Visit (INDEPENDENT_AMBULATORY_CARE_PROVIDER_SITE_OTHER): Payer: 59 | Admitting: Obstetrics and Gynecology

## 2022-03-05 VITALS — BP 100/73 | HR 87 | Ht 63.0 in | Wt 170.6 lb

## 2022-03-05 DIAGNOSIS — Z113 Encounter for screening for infections with a predominantly sexual mode of transmission: Secondary | ICD-10-CM | POA: Diagnosis present

## 2022-03-05 DIAGNOSIS — Z1231 Encounter for screening mammogram for malignant neoplasm of breast: Secondary | ICD-10-CM | POA: Diagnosis not present

## 2022-03-05 DIAGNOSIS — N941 Unspecified dyspareunia: Secondary | ICD-10-CM

## 2022-03-05 DIAGNOSIS — G8929 Other chronic pain: Secondary | ICD-10-CM

## 2022-03-05 DIAGNOSIS — N898 Other specified noninflammatory disorders of vagina: Secondary | ICD-10-CM

## 2022-03-05 DIAGNOSIS — B3731 Acute candidiasis of vulva and vagina: Secondary | ICD-10-CM

## 2022-03-05 DIAGNOSIS — B9689 Other specified bacterial agents as the cause of diseases classified elsewhere: Secondary | ICD-10-CM

## 2022-03-05 DIAGNOSIS — N760 Acute vaginitis: Secondary | ICD-10-CM

## 2022-03-05 NOTE — Progress Notes (Signed)
? ?GYNECOLOGY OFFICE VISIT NOTE ? ?History:  ? Hannah Lynch is a 51 y.o. G2P0 here today for dyspareunia and vaginal itching.  ? ?She has had pain with intercourse throughout the intercourse for the last 3-4 weeks. She does note she caught her husband cheating so she would like to have STI screening including blood work. She denies vaginal discharge.  ? ?She does have some vaginal itching for the last 2 days. She has done two rounds of ABX the most recent was 10 days for an ear infection.  ? ?She denies vaginal bleeding. She had a TAH in her 30s for endometriosis. She does not have her cervix. She does have her ovaries.  ? ?She denies symptoms of hot flashes.  ? ? ?  ?Past Medical History:  ?Diagnosis Date  ? Anxiety   ? Degenerative disc disease, lumbar   ? Degenerative disc disease, lumbar 2012  ? lumbar and cervical region  ? Depression   ? Endometriosis   ? Family history of adverse reaction to anesthesia   ? pt's mother has hx. of post-op N/V  ? Headache   ? Hypertension   ? Hypothyroidism   ? IBS (irritable bowel syndrome)   ? Nasal septal deviation 11/2017  ? Nasal turbinate hypertrophy 11/2017  ? PONV (postoperative nausea and vomiting)   ? Snoring 11/2017  ? ? ?Past Surgical History:  ?Procedure Laterality Date  ? ABDOMINAL HYSTERECTOMY  2005  ? partial - TAH, no cervix, has both ovaries; done for endometriosis  ? ANTERIOR CERVICAL DECOMP/DISCECTOMY FUSION  03/25/2007  ? C4-5  ? CERVICAL FUSION  2013  ? CHOLECYSTECTOMY N/A 03/26/2018  ? Procedure: LAPAROSCOPIC CHOLECYSTECTOMY;  Surgeon: Franky Macho, MD;  Location: [facility];  Service: General;  Laterality: N/A;  ? KNEE ARTHROSCOPY Right   ? LAPAROSCOPIC GASTRIC SLEEVE RESECTION N/A 09/27/2019  ? Procedure: LAPAROSCOPIC GASTRIC SLEEVE RESECTION, HIATAL HERNIA REPARI ;Upper Endo, Eras Pathway;  Surgeon: Gaynelle Adu, MD;  Location: [facility];  Service: General;  Laterality: N/A;  ? LUMBAR LAMINECTOMY/DECOMPRESSION MICRODISCECTOMY  11/21/2004  ? L4-5  ?  MAXIMUM ACCESS (MAS)POSTERIOR LUMBAR INTERBODY FUSION (PLIF) 1 LEVEL N/A 09/25/2014  ? Procedure: Lumbar four-five MAXIMUM ACCESS (MAS) POSTERIOR LUMBAR INTERBODY FUSION (PLIF) 1 LEVEL;  Surgeon: Maeola Harman, MD;  Location: [facility];  Service: Neurosurgery;  Laterality: N/A;  ? NASAL SEPTOPLASTY W/ TURBINOPLASTY N/A 12/07/2017  ? Procedure: NASAL SEPTOPLASTY WITH BILATERAL TURBINATE REDUCTION;  Surgeon: Christia Reading, MD;  Location:  [facility];  Service: ENT;  Laterality: N/A;  ? ORIF ANKLE FRACTURE Right   ? TONSILLECTOMY  06/2018  ? UPPER GI ENDOSCOPY    ? ? ?The following portions of the patient's history were reviewed and updated as appropriate: allergies, current medications, past family history, past medical history, past social history, past surgical history and problem list.  ? ?Health Maintenance:   ?Normal mammogram on 03/2021.  ? ?Review of Systems:  ?Pertinent items noted in HPI and remainder of comprehensive ROS otherwise negative. ? ?Physical Exam:  ?BP 100/73 (BP Location: Left Arm, Patient Position: Sitting, Cuff Size: Normal)   Pulse 87   Ht 5\' 3"  (1.6 m)   Wt 170 lb 9.6 oz (77.4 kg)   BMI 30.22 kg/m?  ?CONSTITUTIONAL: Well-developed, well-nourished female in no acute distress.  ?HEENT:  Normocephalic, atraumatic. External right and left ear normal. No scleral icterus.  ?NECK: Normal range of motion, supple, no masses noted on observation ?SKIN: No rash noted. Not diaphoretic. No erythema.  No pallor. ?MUSCULOSKELETAL: Normal range of motion. No edema noted. ?NEUROLOGIC: Alert and oriented to person, place, and time. Normal muscle tone coordination. No cranial nerve deficit noted. ?PSYCHIATRIC: Normal mood and affect. Normal behavior. Normal judgment and thought content. ? ?CARDIOVASCULAR: Normal heart rate noted ?RESPIRATORY: Effort and breath sounds normal, no problems with respiration noted ?ABDOMEN: No masses noted. No other overt distention noted.   ? ?PELVIC: Normal  appearing external genitalia; normal urethral meatus; normal appearing vaginal mucosa.  No abnormal discharge noted.  Cervix and uterus surgically absent.  no other palpable masses, no uterine or adnexal tenderness. Performed in the presence of a chaperone ? ?Labs and Imaging ?No results found for this or any previous visit (from the past 168 hour(s)). ?XR KNEE 3 VIEW LEFT ? ?Result Date: 02/05/2022 ?Films of the left knee were obtained in 3 projections standing and compared to films performed of the same knee in 2021.  Minimal change.  There is very small osteophyte formation in the lateral compartment at the patellofemoral joint but no significant narrowing.  There is a little bit of a lateral patella tilt.  No ectopic calcification or acute change.  Films are consistent with very minimal changes of osteoarthritis   ?Assessment and Plan:  ? 1. Screening for STD (sexually transmitted disease) ?- Cervicovaginal ancillary only ?- Urine Culture ?- Urinalysis, Routine w reflex microscopic ?- FSH ?- Hepatitis B surface antigen ?- Hepatitis C antibody ?- HIV Antibody (routine testing w rflx) ?- RPR ? ?2. Vaginal itching ?- Cervicovaginal ancillary only ? ?3. Breast cancer screening by mammogram ?- MM 3D SCREEN BREAST BILATERAL; Future ? ?4. Dyspareunia in female ?- Discussed common causes and causes based on her exam. She does have some obturator tenderness especially on the right. She has bladder tenderness the most distinctly. We discussed checking for UTI, pelvic US to rule out adnexal pathology as exam is not reliable. Pelvic floor pain is mild so unlikely the direct cause. Certainly concerning for interstitial cystitis. Discussed diagnosis of exclusion so we will check for other causes first but will give information just in case. Could also be dryness related to menopause - checking FSH. Lastly could be vaginitis - panel checked.  ?- Urine Culture ?- Urinalysis, Routine w reflex microscopic ?- FSH ?- Hepatitis B  surface antigen ?- Hepatitis C antibody ?- HIV Antibody (routine testing w rflx) ?- RPR ?- US PELVIC COMPLETE WITH TRANSVAGINAL; Future ? ? ?Routine preventative health maintenance measures emphasized. ?Please refer to After Visit Summary for other counseling recommendations.  ? ?Return if symptoms worsen or fail to improve. ? ?Milas Hock, MD, FACOG ?Obstetrician Heritage manager, Faculty Practice ?Center for [employer] ? ? ? ? ? ?

## 2022-03-05 NOTE — Addendum Note (Signed)
Addended by: Moss Mc on: 03/05/2022 12:39 PM ? ? Modules accepted: Orders ? ?

## 2022-03-06 LAB — URINALYSIS, ROUTINE W REFLEX MICROSCOPIC
Bilirubin, UA: NEGATIVE
Glucose, UA: NEGATIVE
Ketones, UA: NEGATIVE
Leukocytes,UA: NEGATIVE
Nitrite, UA: NEGATIVE
Protein,UA: NEGATIVE
RBC, UA: NEGATIVE
Specific Gravity, UA: 1.011 (ref 1.005–1.030)
Urobilinogen, Ur: 1 mg/dL (ref 0.2–1.0)
pH, UA: 8 — ABNORMAL HIGH (ref 5.0–7.5)

## 2022-03-06 LAB — FOLLICLE STIMULATING HORMONE: FSH: 6.2 m[IU]/mL

## 2022-03-07 LAB — CERVICOVAGINAL ANCILLARY ONLY
Bacterial Vaginitis (gardnerella): POSITIVE — AB
Candida Glabrata: POSITIVE — AB
Candida Vaginitis: NEGATIVE
Chlamydia: NEGATIVE
Comment: NEGATIVE
Comment: NEGATIVE
Comment: NEGATIVE
Comment: NEGATIVE
Comment: NEGATIVE
Comment: NORMAL
Neisseria Gonorrhea: NEGATIVE
Trichomonas: NEGATIVE

## 2022-03-07 LAB — URINE CULTURE: Organism ID, Bacteria: NO GROWTH

## 2022-03-07 MED ORDER — FLUCONAZOLE 150 MG PO TABS: 150.0000 mg | ORAL_TABLET | ORAL | 3 refills | Status: DC

## 2022-03-07 MED ORDER — METRONIDAZOLE 500 MG PO TABS: 500.0000 mg | ORAL_TABLET | Freq: Two times a day (BID) | ORAL | 0 refills | Status: DC

## 2022-03-07 NOTE — Addendum Note (Signed)
Addended by: Milas Hock A on: 03/07/2022 12:27 PM ? ? Modules accepted: Orders ? ?

## 2022-03-11 NOTE — Telephone Encounter (Signed)
Might be worth seeing her in [place] tomorrow

## 2022-03-15 ENCOUNTER — Other Ambulatory Visit (HOSPITAL_COMMUNITY): Payer: Self-pay

## 2022-03-17 ENCOUNTER — Other Ambulatory Visit (HOSPITAL_COMMUNITY): Payer: Self-pay

## 2022-03-17 MED ORDER — CYCLOBENZAPRINE HCL 10 MG PO TABS
ORAL_TABLET | ORAL | 0 refills | Status: DC
  Filled 2022-03-17: qty 60, 20d supply, fill #0

## 2022-03-19 ENCOUNTER — Encounter: Payer: Self-pay | Admitting: Orthopaedic Surgery

## 2022-03-19 ENCOUNTER — Ambulatory Visit
Admission: RE | Admit: 2022-03-19 | Discharge: 2022-03-19 | Disposition: A | Discharge status: Home / Self Care | Payer: 59 | Source: Ambulatory Visit | Attending: Obstetrics and Gynecology | Admitting: Obstetrics and Gynecology

## 2022-03-19 ENCOUNTER — Ambulatory Visit (INDEPENDENT_AMBULATORY_CARE_PROVIDER_SITE_OTHER): Payer: 59 | Admitting: Orthopaedic Surgery

## 2022-03-19 DIAGNOSIS — N941 Unspecified dyspareunia: Secondary | ICD-10-CM

## 2022-03-19 DIAGNOSIS — G8929 Other chronic pain: Secondary | ICD-10-CM | POA: Diagnosis not present

## 2022-03-19 DIAGNOSIS — M25562 Pain in left knee: Secondary | ICD-10-CM | POA: Diagnosis not present

## 2022-03-19 DIAGNOSIS — Z1231 Encounter for screening mammogram for malignant neoplasm of breast: Secondary | ICD-10-CM

## 2022-03-19 NOTE — Progress Notes (Signed)
? ?Office Visit Note ?  ?Patient: Hannah Lynch           ?Date of Birth: [birth year]           ?MRN: [id number] ?Visit Date: 03/19/2022 ?             ?Requested by: Eartha Inch, MD ?[address] ?[place],  [place] [address] ?PCP: Eartha Inch, MD ? ? ?Assessment & Plan: ?Visit Diagnoses:  ?1. Chronic pain of left knee   ? ? ?Plan: Ms. Hanssen has had a chronic problem with her left knee.  She has had a prior knee arthroscopy in the past with a partial lateral meniscectomy.  More recently she has had trouble going up and down stairs and occasional swelling of her knee.  Because of her poor response to nonoperative treatment I ordered an MRI scan.  This demonstrated some degeneration of the posterior horn of the medial meniscus but without a discrete tear.  The body and posterior horn of the lateral meniscus were attenuated consistent with partial meniscectomy.  Collateral ligaments and cruciates were intact.  There was partial-thickness cartilage loss in all 3 compartments.  Long discussion regarding all of the above.  I think it is worth becoming involved in an exercise program for quad and hamstring strengthening.  She is a member of a workout facility.  We will plan to see her back as needed.  We can always consider cortisone or even viscosupplementation over time.  I do not think she is a candidate for knee replacement at this point ? ?Follow-Up Instructions: Return if symptoms worsen or fail to improve.  ? ?Orders:  ?No orders of the defined types were placed in this encounter. ? ?No orders of the defined types were placed in this encounter. ? ? ? ? Procedures: ?No procedures performed ? ? ?Clinical Data: ?No additional findings. ? ? ?Subjective: ?Chief Complaint  ?Patient presents with  ? Left Knee - Follow-up  ?  MRI review  ?Patient presents today for follow up on her left knee. She had an MRI and is here today for those results. ? ?HPI ? ?Review of Systems ? ? ?Objective: ?Vital Signs: There were no  vitals taken for this visit. ? ?Physical Exam ?Constitutional:   ?   Appearance: She is well-developed.  ?Eyes:  ?   Pupils: Pupils are equal, round, and reactive to light.  ?Pulmonary:  ?   Effort: Pulmonary effort is normal.  ?Skin: ?   General: Skin is warm and dry.  ?Neurological:  ?   Mental Status: She is alert and oriented to person, place, and time.  ?Psychiatric:     ?   Behavior: Behavior normal.  ? ? ?Ortho Exam left knee was not hot red warm or swollen but may be a very small effusion.  Mostly anterior pain about the patellofemoral joint with some crepitation and minimal discomfort with patella compression.  No medial or lateral joint pain.  Well-healed prior arthroscopic portals.  No opening with varus or valgus stress.  Full extension flexed over 105 degrees.  No popliteal pain or mass.  No calf pain or swelling ? ?Specialty Comments:  ?No specialty comments available. ? ?Imaging: ?No results found. ? ? ?PMFS History: ?Patient Active Problem List  ? Diagnosis Date Noted  ? Trochanteric bursitis, left hip 08/30/2020  ? 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 Pain in left knee 10/12/2019  ? Obesity (BMI 30-39.9)  09/27/2019  ? GERD (gastroesophageal reflux disease) 09/27/2019  ? Essential hypertension 09/27/2019  ? Fatty liver 09/27/2019  ? Metabolic syndrome 09/27/2019  ? Pain in right ankle and joints of right foot 04/13/2019  ? Chronic cholecystitis   ? Primary insomnia 06/25/2017  ? Papanicolaou smear for cervical cancer screening 06/25/2017  ? Bilateral hearing loss 06/25/2017  ? Acute serous otitis media 06/25/2017  ? Lumbar stenosis with neurogenic claudication 09/25/2014  ? ?Past Medical History:  ?Diagnosis Date  ? Anxiety   ? Degenerative disc disease, lumbar   ? Degenerative disc disease, lumbar 2012  ? lumbar and cervical region  ? Depression   ? Endometriosis   ? Family history of adverse reaction to anesthesia   ? pt's mother has hx. of post-op N/V  ? Headache    ? Hypertension   ? Hypothyroidism   ? IBS (irritable bowel syndrome)   ? Nasal septal deviation 11/2017  ? Nasal turbinate hypertrophy 11/2017  ? PONV (postoperative nausea and vomiting)   ? Snoring 11/2017  ?  ?Family History  ?Problem Relation Age of Onset  ? Hypertension Mother   ? Diabetes Mother   ? Heart failure Father   ? Hypertension Father   ? Diabetes Father   ? Heart failure Other   ? Breast cancer Paternal Grandmother   ?  ?Past Surgical History:  ?Procedure Laterality Date  ? ABDOMINAL HYSTERECTOMY  2005  ? partial - TAH, no cervix, has both ovaries; done for endometriosis  ? ANTERIOR CERVICAL DECOMP/DISCECTOMY FUSION  03/25/2007  ? C4-5  ? CERVICAL FUSION  2013  ? CHOLECYSTECTOMY N/A 03/26/2018  ? Procedure: LAPAROSCOPIC CHOLECYSTECTOMY;  Surgeon: Franky Macho, MD;  Location: [facility];  Service: General;  Laterality: N/A;  ? KNEE ARTHROSCOPY Right   ? LAPAROSCOPIC GASTRIC SLEEVE RESECTION N/A 09/27/2019  ? Procedure: LAPAROSCOPIC GASTRIC SLEEVE RESECTION, HIATAL HERNIA REPARI ;Upper Endo, Eras Pathway;  Surgeon: Gaynelle Adu, MD;  Location: [facility];  Service: General;  Laterality: N/A;  ? LUMBAR LAMINECTOMY/DECOMPRESSION MICRODISCECTOMY  11/21/2004  ? L4-5  ? MAXIMUM ACCESS (MAS)POSTERIOR LUMBAR INTERBODY FUSION (PLIF) 1 LEVEL N/A 09/25/2014  ? Procedure: Lumbar four-five MAXIMUM ACCESS (MAS) POSTERIOR LUMBAR INTERBODY FUSION (PLIF) 1 LEVEL;  Surgeon: Maeola Harman, MD;  Location: [facility];  Service: Neurosurgery;  Laterality: N/A;  ? NASAL SEPTOPLASTY W/ TURBINOPLASTY N/A 12/07/2017  ? Procedure: NASAL SEPTOPLASTY WITH BILATERAL TURBINATE REDUCTION;  Surgeon: Christia Reading, MD;  Location: [facility];  Service: ENT;  Laterality: N/A;  ? ORIF ANKLE FRACTURE Right   ? TONSILLECTOMY  06/2018  ? UPPER GI ENDOSCOPY    ? ?Social History  ? ?Occupational History  ? Not on file  ?Tobacco Use  ? Smoking status: Never  ? Smokeless tobacco: Never  ?Vaping Use  ? Vaping Use: Never used  ?Substance  and Sexual Activity  ? Alcohol use: Yes  ?  Comment: once in awhile  ? Drug use: Never  ? Sexual activity: Yes  ?  Birth control/protection: None  ? ? ? ? ? ? ?

## 2022-04-02 ENCOUNTER — Ambulatory Visit
Admission: RE | Admit: 2022-04-02 | Discharge: 2022-04-02 | Disposition: A | Discharge status: Home / Self Care | Payer: 59 | Source: Ambulatory Visit | Attending: Gastroenterology | Admitting: Gastroenterology

## 2022-04-02 ENCOUNTER — Other Ambulatory Visit: Payer: Self-pay | Admitting: Gastroenterology

## 2022-04-02 ENCOUNTER — Ambulatory Visit (HOSPITAL_BASED_OUTPATIENT_CLINIC_OR_DEPARTMENT_OTHER)
Admission: RE | Admit: 2022-04-02 | Discharge: 2022-04-02 | Disposition: A | Discharge status: Home / Self Care | Payer: 59 | Source: Ambulatory Visit | Attending: Obstetrics and Gynecology | Admitting: Obstetrics and Gynecology

## 2022-04-02 DIAGNOSIS — R195 Other fecal abnormalities: Secondary | ICD-10-CM

## 2022-04-02 DIAGNOSIS — N941 Unspecified dyspareunia: Secondary | ICD-10-CM | POA: Diagnosis present

## 2022-04-02 DIAGNOSIS — R1084 Generalized abdominal pain: Secondary | ICD-10-CM

## 2022-04-08 ENCOUNTER — Encounter: Payer: Self-pay | Admitting: Obstetrics & Gynecology

## 2022-04-08 ENCOUNTER — Ambulatory Visit (INDEPENDENT_AMBULATORY_CARE_PROVIDER_SITE_OTHER): Payer: 59 | Admitting: Obstetrics & Gynecology

## 2022-04-08 ENCOUNTER — Other Ambulatory Visit: Payer: Self-pay | Admitting: Obstetrics & Gynecology

## 2022-04-08 ENCOUNTER — Other Ambulatory Visit: Payer: 59

## 2022-04-08 VITALS — BP 107/71 | HR 86 | Ht 64.0 in | Wt 164.5 lb

## 2022-04-08 DIAGNOSIS — N941 Unspecified dyspareunia: Secondary | ICD-10-CM

## 2022-04-08 DIAGNOSIS — Z113 Encounter for screening for infections with a predominantly sexual mode of transmission: Secondary | ICD-10-CM | POA: Diagnosis not present

## 2022-04-08 NOTE — Progress Notes (Signed)
Follow up appointment for results:  GYN sonogram and labs  Chief Complaint  Patient presents with   discuss [place] results    Blood pressure 107/71, pulse 86, height 5\' 4"  (1.626 m), weight 164 lb 8 oz (74.6 kg).     CLINICAL DATA:  Pelvic pain   EXAM: TRANSABDOMINAL AND TRANSVAGINAL ULTRASOUND OF PELVIS   TECHNIQUE: Both transabdominal and transvaginal ultrasound examinations of the pelvis were performed. Transabdominal technique was performed for global imaging of the pelvis including uterus, ovaries, adnexal regions, and pelvic cul-de-sac. It was necessary to proceed with endovaginal exam following the transabdominal exam to visualize the ovaries and adnexa.   COMPARISON:  Ultrasound 02/03/2018   FINDINGS: Uterus   Status post hysterectomy   Endometrium   Status post hysterectomy   Right ovary   Measurements: 2.8 x 1.5 x 1.4 cm = volume: 3.1 mL. Normal appearance/no adnexal mass.   Left ovary   Measurements: 2.3 x 1 x 2 cm = volume: 2.5 mL. Normal appearance/no adnexal mass.   Other findings   No abnormal free fluid.   IMPRESSION: Status post hysterectomy.  Otherwise negative examination     Electronically Signed   By: 02/05/2018 M.D.   On: 04/02/2022 15:36    MEDS ordered this encounter: No orders of the defined types were placed in this encounter.   Orders for this encounter: Orders Placed This Encounter  Procedures   RPR+HBsAg+HIV   HCV Ab w Reflex to Quant PCR    Impression + Management Plan   ICD-10-CM   1. Dyspareunia in female  N94.10    work up is normal so far, recommend [place] warming as approach    2. Screening for STD (sexually transmitted disease)  Z11.3 RPR+HBsAg+HIV    HCV Ab w Reflex to Quant PCR      Follow Up: Return if symptoms worsen or fail to improve.     All questions were answered.  Past Medical History:  Diagnosis Date   Anxiety    Degenerative disc disease, lumbar    Degenerative disc disease, lumbar  2012   lumbar and cervical region   Depression    Endometriosis    Family history of adverse reaction to anesthesia    pt's mother has hx. of post-op N/V   Headache    Hypertension    Hypothyroidism    IBS (irritable bowel syndrome)    Nasal septal deviation 11/2017   Nasal turbinate hypertrophy 11/2017   PONV (postoperative nausea and vomiting)    Snoring 11/2017    Past Surgical History:  Procedure Laterality Date   ABDOMINAL HYSTERECTOMY  2005   partial - TAH, no cervix, has both ovaries; done for endometriosis   ANTERIOR CERVICAL DECOMP/DISCECTOMY FUSION  03/25/2007   C4-5   CERVICAL FUSION  2013   CHOLECYSTECTOMY N/A 03/26/2018   Procedure: LAPAROSCOPIC CHOLECYSTECTOMY;  Surgeon: 03/28/2018, MD;  Location: [facility];  Service: General;  Laterality: N/A;   KNEE ARTHROSCOPY Right    LAPAROSCOPIC GASTRIC SLEEVE RESECTION N/A 09/27/2019   Procedure: LAPAROSCOPIC GASTRIC SLEEVE RESECTION, HIATAL HERNIA REPARI ;Upper Endo, Eras Pathway;  Surgeon: 09/29/2019, MD;  Location: [facility];  Service: General;  Laterality: N/A;   LUMBAR LAMINECTOMY/DECOMPRESSION MICRODISCECTOMY  11/21/2004   L4-5   MAXIMUM ACCESS (MAS)POSTERIOR LUMBAR INTERBODY FUSION (PLIF) 1 LEVEL N/A 09/25/2014   Procedure: Lumbar four-five MAXIMUM ACCESS (MAS) POSTERIOR LUMBAR INTERBODY FUSION (PLIF) 1 LEVEL;  Surgeon: 09/27/2014, MD;  [facility]: [facility];  Service: Neurosurgery;  Laterality: N/A;   NASAL SEPTOPLASTY W/ TURBINOPLASTY N/A 12/07/2017   Procedure: NASAL SEPTOPLASTY WITH BILATERAL TURBINATE REDUCTION;  Surgeon: Christia Reading, MD;  Location:  [facility];  Service: ENT;  Laterality: N/A;   ORIF ANKLE FRACTURE Right    TONSILLECTOMY  06/2018   UPPER GI ENDOSCOPY      OB History     Gravida  2   Para      Term      Preterm      AB      Living         SAB      IAB      Ectopic      Multiple      Live Births              No Known Allergies  Social History    Socioeconomic History   Marital status: Married    Spouse name: Not on file   Number of children: Not on file   Years of education: Not on file   Highest education level: Not on file  Occupational History   Not on file  Tobacco Use   Smoking status: Never   Smokeless tobacco: Never  Vaping Use   Vaping Use: Never used  Substance and Sexual Activity   Alcohol use: Yes    Comment: once in awhile   Drug use: Never   Sexual activity: Yes    Birth control/protection: Surgical    Comment: hyst  Other Topics Concern   Not on file  Social History Narrative   Not on file   Social Determinants of Health   Financial Resource Strain: Low Risk    Difficulty of Paying Living Expenses: Not hard at all  Food Insecurity: No Food Insecurity   Worried About Programme researcher, broadcasting/film/video in the Last Year: Never true   Ran Out of Food in the Last Year: Never true  Transportation Needs: No Transportation Needs   Lack of Transportation (Medical): No   Lack of Transportation (Non-Medical): No  Physical Activity: Insufficiently Active   Days of Exercise per Week: 3 days   Minutes of Exercise per Session: 30 min  Stress: No Stress Concern Present   Feeling of Stress : Not at all  Social Connections: Moderately Integrated   Frequency of Communication with Friends and Family: More than three times a week   Frequency of Social Gatherings with Friends and Family: Twice a week   Attends Religious Services: More than 4 times per year   Active Member of [employer] or Organizations: No   Attends Engineer, structural: Never   Marital Status: Married    Family History  Problem Relation Age of Onset   Hypertension Mother    Diabetes Mother    Heart failure Father    Hypertension Father    Diabetes Father    Heart failure Other    Breast cancer Paternal Grandmother

## 2022-04-09 LAB — RPR+HBSAG+HIV
HIV Screen 4th Generation wRfx: NONREACTIVE
Hepatitis B Surface Ag: NEGATIVE
RPR Ser Ql: NONREACTIVE

## 2022-04-09 LAB — HCV INTERPRETATION

## 2022-04-09 LAB — HCV AB W REFLEX TO QUANT PCR: HCV Ab: NONREACTIVE

## 2022-04-11 ENCOUNTER — Other Ambulatory Visit: Payer: Self-pay | Admitting: Gastroenterology

## 2022-04-18 ENCOUNTER — Other Ambulatory Visit (HOSPITAL_COMMUNITY): Payer: Self-pay

## 2022-04-18 MED ORDER — XIFAXAN 550 MG PO TABS
ORAL_TABLET | ORAL | 0 refills | Status: DC
  Filled 2022-04-18: qty 42, 14d supply, fill #0

## 2022-04-21 ENCOUNTER — Other Ambulatory Visit (HOSPITAL_COMMUNITY): Payer: Self-pay

## 2022-04-21 MED ORDER — TOPIRAMATE 25 MG PO TABS
ORAL_TABLET | ORAL | 1 refills | Status: DC
  Filled 2022-04-21: qty 120, 30d supply, fill #0
  Filled 2022-05-02 – 2022-05-17 (×3): qty 120, 30d supply, fill #1

## 2022-04-23 ENCOUNTER — Other Ambulatory Visit (HOSPITAL_COMMUNITY): Payer: Self-pay

## 2022-04-24 ENCOUNTER — Encounter (HOSPITAL_COMMUNITY): Payer: Self-pay | Admitting: *Deleted

## 2022-05-02 ENCOUNTER — Other Ambulatory Visit (HOSPITAL_COMMUNITY): Payer: Self-pay

## 2022-05-02 MED ORDER — CYCLOBENZAPRINE HCL 10 MG PO TABS
ORAL_TABLET | ORAL | 0 refills | Status: DC
  Filled 2022-05-02: qty 60, 20d supply, fill #0

## 2022-05-17 ENCOUNTER — Other Ambulatory Visit (HOSPITAL_COMMUNITY): Payer: Self-pay

## 2022-05-19 ENCOUNTER — Other Ambulatory Visit (HOSPITAL_COMMUNITY): Payer: Self-pay

## 2022-06-02 ENCOUNTER — Ambulatory Visit (INDEPENDENT_AMBULATORY_CARE_PROVIDER_SITE_OTHER): Payer: 59

## 2022-06-02 ENCOUNTER — Ambulatory Visit: Payer: 59

## 2022-06-02 ENCOUNTER — Ambulatory Visit
Admission: EM | Admit: 2022-06-02 | Discharge: 2022-06-02 | Disposition: A | Discharge status: Home / Self Care | Payer: 59 | Attending: Family Medicine | Admitting: Family Medicine

## 2022-06-02 DIAGNOSIS — M542 Cervicalgia: Secondary | ICD-10-CM | POA: Diagnosis not present

## 2022-06-02 DIAGNOSIS — S161XXA Strain of muscle, fascia and tendon at neck level, initial encounter: Secondary | ICD-10-CM | POA: Diagnosis not present

## 2022-06-02 MED ORDER — CYCLOBENZAPRINE HCL 10 MG PO TABS
ORAL_TABLET | ORAL | 0 refills | Status: DC
Start: 2022-06-02 — End: 2023-04-15

## 2022-06-02 MED ORDER — KETOROLAC TROMETHAMINE 30 MG/ML IJ SOLN
30.0000 mg | Freq: Once | INTRAMUSCULAR | Status: AC
  Administered 2022-06-02: 30 mg via INTRAMUSCULAR

## 2022-06-02 NOTE — ED Provider Notes (Signed)
[facility]    CSN: [id number] Arrival date & time: 06/02/22  1035      History   Chief Complaint Chief Complaint  Patient presents with   Neck Pain    HPI Hannah Lynch is a 51 y.o. female.   Presenting today with 2-day history of left-sided neck pain, stiffness.  States her neck hurts significantly with turning to the left, lesser to the right.  She denies known injury, radiation of pain down arms, numbness, tingling, weakness, swelling, headache, vision changes.  Not trying anything over-the-counter for symptoms.  Requesting an x-ray as she has a history of cervical fusion and wants to make sure hardware is not out of place.    Past Medical History:  Diagnosis Date   Anxiety    Degenerative disc disease, lumbar    Degenerative disc disease, lumbar 2012   lumbar and cervical region   Depression    Endometriosis    Family history of adverse reaction to anesthesia    pt's mother has hx. 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 08/30/2020   Pain in left knee 10/12/2019   Obesity (BMI 30-39.9) 09/27/2019   GERD (gastroesophageal reflux disease) 09/27/2019   Essential hypertension 09/27/2019   Fatty liver 09/27/2019   Metabolic syndrome 09/27/2019   Pain in right ankle and joints of right foot 04/13/2019   Chronic cholecystitis    Primary insomnia 06/25/2017   Papanicolaou smear for cervical cancer screening 06/25/2017   Bilateral hearing loss 06/25/2017   Acute serous otitis media 06/25/2017   Lumbar stenosis with neurogenic claudication 09/25/2014    Past Surgical History:  Procedure Laterality Date    ABDOMINAL HYSTERECTOMY  2005   partial - TAH, no cervix, has both ovaries; done for endometriosis   ANTERIOR CERVICAL DECOMP/DISCECTOMY FUSION  03/25/2007   C4-5   CERVICAL FUSION  2013   CHOLECYSTECTOMY N/A 03/26/2018   Procedure: LAPAROSCOPIC CHOLECYSTECTOMY;  Surgeon: Franky Macho, MD;  Location: [facility];  Service: General;  Laterality: N/A;   KNEE ARTHROSCOPY Right    LAPAROSCOPIC GASTRIC SLEEVE RESECTION N/A 09/27/2019   Procedure: LAPAROSCOPIC GASTRIC SLEEVE RESECTION, HIATAL HERNIA REPARI ;Upper Endo, Eras Pathway;  Surgeon: Gaynelle Adu, MD;  Location: [facility];  Service: General;  Laterality: N/A;   LUMBAR LAMINECTOMY/DECOMPRESSION MICRODISCECTOMY  11/21/2004   L4-5   MAXIMUM ACCESS (MAS)POSTERIOR LUMBAR INTERBODY FUSION (PLIF) 1 LEVEL N/A 09/25/2014   Procedure: Lumbar four-five MAXIMUM ACCESS (MAS) POSTERIOR LUMBAR INTERBODY FUSION (PLIF) 1 LEVEL;  Surgeon: Maeola Harman, MD;  Location: [facility];  Service: Neurosurgery;  Laterality: N/A;   NASAL SEPTOPLASTY W/ TURBINOPLASTY N/A 12/07/2017   Procedure: NASAL SEPTOPLASTY WITH BILATERAL TURBINATE REDUCTION;  Surgeon: Christia Reading, MD;  Location: [facility];  Service: ENT;  Laterality: N/A;   ORIF ANKLE FRACTURE Right    TONSILLECTOMY  06/2018   UPPER GI ENDOSCOPY      OB History     Gravida  2   Para      Term      Preterm      AB  Living         SAB      IAB      Ectopic      Multiple      Live Births               Home Medications    Prior to Admission medications   Medication Sig Start Date End Date Taking? Authorizing Provider  cyclobenzaprine (FLEXERIL) 10 MG tablet TAKE 1 TABLET BY MOUTH EVERY 8 HOURS AS NEEDED FOR MUSCLE SPASM 06/02/22   Particia Nearing, PA-C  hyoscyamine (ANASPAZ) 0.125 MG TBDP disintergrating tablet Allow 1 tablet to dissolve on the tongue every 6 hours as needed 01/27/22     levothyroxine (SYNTHROID) 25 MCG tablet Take 1 tablet by mouth once daily  11/22/21     metoprolol succinate (TOPROL-XL) 25 MG 24 hr tablet Take one tablet (25 mg dose) by mouth daily. 02/06/22     montelukast (SINGULAIR) 10 MG tablet Take one tablet (10 mg dose) by mouth daily. 02/28/22     Multiple Vitamin (MULTIVITAMIN) tablet Take 1 tablet by mouth daily.    [provider]  pantoprazole (PROTONIX) 40 MG tablet Take one tablet (40 mg dose) by mouth daily. 02/06/22     rifaximin (XIFAXAN) 550 MG TABS tablet Take 1 tablet by mouth three times a day for 14 days 04/18/22     topiramate (TOPAMAX) 25 MG tablet Take 1 tablet by mouth daily for 1 week, then 1 tab twice daily for 1 week, then 1 tab in the morning and 2 tabs in evening for 1 week, then 2 tabs in the morning and 2 tabs in the evening thereafter. 04/20/22     traZODone (DESYREL) 150 MG tablet Take one tablet (150 mg dose) by mouth at bedtime. 02/28/22       Family History Family History  Problem Relation Age of Onset   Hypertension Mother    Diabetes Mother    Heart failure Father    Hypertension Father    Diabetes Father    Heart failure Other    Breast cancer Paternal Grandmother     Social History Social History   Tobacco Use   Smoking status: Never   Smokeless tobacco: Never  Vaping Use   Vaping Use: Never used  Substance Use Topics   Alcohol use: Yes    Comment: once in awhile   Drug use: Never     Allergies   Patient has no known allergies.   Review of Systems Review of Systems Per HPI  Physical Exam Triage Vital Signs ED Triage Vitals  Enc Vitals Group     BP 06/02/22 1100 93/70     Pulse Rate 06/02/22 1100 91     Resp 06/02/22 1100 18     Temp 06/02/22 1100 98.4 F (36.9 C)     Temp src --      SpO2 06/02/22 1100 98 %     Weight --      Height --      Head Circumference --      Peak Flow --      Pain Score 06/02/22 1059 6     Pain Loc --      Pain Edu? --      Excl. in GC? --    No data found.  Updated Vital Signs BP 93/70   Pulse 91   Temp 98.4 F  (36.9 C)   Resp 18   SpO2 98%  Visual Acuity Right Eye Distance:   Left Eye Distance:   Bilateral Distance:    Right Eye Near:   Left Eye Near:    Bilateral Near:     Physical Exam Vitals and nursing note reviewed.  Constitutional:      Appearance: Normal appearance. She is not ill-appearing.  HENT:     Head: Atraumatic.  Eyes:     Extraocular Movements: Extraocular movements intact.     Conjunctiva/sclera: Conjunctivae normal.  Cardiovascular:     Rate and Rhythm: Normal rate and regular rhythm.     Heart sounds: Normal heart sounds.  Pulmonary:     Effort: Pulmonary effort is normal.     Breath sounds: Normal breath sounds.  Musculoskeletal:        General: Tenderness present. No swelling or signs of injury. Normal range of motion.     Cervical back: Normal range of motion and neck supple.     Comments: Left-sided SCM tenderness to palpation and mild spasm.  No midline C-spine tenderness to palpation.  Range of motion intact but painful and stiff  Skin:    General: Skin is warm and dry.  Neurological:     Mental Status: She is alert and oriented to person, place, and time.     Sensory: No sensory deficit.     Motor: No weakness.     Gait: Gait normal.  Psychiatric:        Mood and Affect: Mood normal.        Thought Content: Thought content normal.        Judgment: Judgment normal.      UC Treatments / Results  Labs (all labs ordered are listed, but only abnormal results are displayed) Labs Reviewed - No data to display  EKG   Radiology DG Cervical Spine Complete  Result Date: 06/02/2022 CLINICAL DATA:  Left neck pain for 2 days.  No known injury. EXAM: CERVICAL SPINE - COMPLETE 4+ VIEW COMPARISON:  Radiographs dated January 01, 2021 FINDINGS: Anterior cervical discectomy and fusion at C3-C4 and C5-C6. There is also interbody fusion at C4-C5. Degenerative changes at C2-C3 and C5-C6 with anterior bridging osteophytes. The alignment is overall maintained.  No evidence of fracture or subluxation. Paraspinal soft tissues are unremarkable. IMPRESSION: 1.  No evidence of fracture or subluxation. 2. Postsurgical changes as described above. The hardware appears intact. Electronically Signed   By: Larose Hires D.O.   On: 06/02/2022 11:33    Procedures Procedures (including critical care time)  Medications Ordered in UC Medications  ketorolac (TORADOL) 30 MG/ML injection 30 mg (30 mg Intramuscular Given 06/02/22 1143)    Initial Impression / Assessment and Plan / UC Course  I have reviewed the triage vital signs and the nursing notes.  Pertinent labs & imaging results that were available during my care of the patient were reviewed by me and considered in my medical decision making (see chart for details).     X-ray negative for acute bony abnormalities or hardware changes.  Treat for neck strain with IM Toradol, Flexeril, stretches, heat, rest.  Return for worsening symptoms.  Final Clinical Impressions(s) / UC Diagnoses   Final diagnoses:  Strain of neck muscle, initial encounter   Discharge Instructions   None    ED Prescriptions     Medication Sig Dispense Auth. Provider   cyclobenzaprine (FLEXERIL) 10 MG tablet TAKE 1 TABLET BY MOUTH EVERY 8 HOURS AS NEEDED FOR MUSCLE SPASM 10 tablet Particia Nearing, [place]  PDMP not reviewed this encounter.   Particia Nearing, [place] 06/02/22 1221

## 2022-06-02 NOTE — ED Triage Notes (Signed)
Pt presents with c/o neck pain, hurts to turn neck to left, is concerned for hardware in neck.began this am

## 2022-06-08 ENCOUNTER — Telehealth: Payer: Self-pay | Admitting: Urgent Care

## 2022-06-08 MED ORDER — TIZANIDINE HCL 4 MG PO TABS: 4.0000 mg | ORAL_TABLET | Freq: Every day | ORAL | 0 refills | Status: DC

## 2022-06-08 MED ORDER — PREDNISONE 50 MG PO TABS: 50.0000 mg | ORAL_TABLET | Freq: Every day | ORAL | 0 refills | Status: DC

## 2022-06-08 NOTE — Telephone Encounter (Signed)
Patient is continuing to have neck pain, radicular symptoms. Was taking Flexeril as prescribed. Offered prednisone and tizanidine. She plans on following up with her spine specialist.

## 2022-06-09 ENCOUNTER — Other Ambulatory Visit (HOSPITAL_COMMUNITY): Payer: Self-pay

## 2022-06-22 ENCOUNTER — Other Ambulatory Visit (HOSPITAL_COMMUNITY): Payer: Self-pay

## 2022-06-23 ENCOUNTER — Other Ambulatory Visit (HOSPITAL_COMMUNITY): Payer: Self-pay

## 2022-06-23 MED ORDER — TOPIRAMATE 25 MG PO TABS
ORAL_TABLET | ORAL | 1 refills | Status: DC
  Filled 2022-06-23: qty 120, 30d supply, fill #0
  Filled 2022-07-19: qty 120, 30d supply, fill #1

## 2022-07-17 ENCOUNTER — Ambulatory Visit
Admission: EM | Admit: 2022-07-17 | Discharge: 2022-07-17 | Disposition: A | Discharge status: Home / Self Care | Payer: 59 | Attending: Nurse Practitioner | Admitting: Nurse Practitioner

## 2022-07-17 ENCOUNTER — Ambulatory Visit (INDEPENDENT_AMBULATORY_CARE_PROVIDER_SITE_OTHER): Payer: 59

## 2022-07-17 DIAGNOSIS — M545 Low back pain, unspecified: Secondary | ICD-10-CM | POA: Diagnosis not present

## 2022-07-17 DIAGNOSIS — S39012A Strain of muscle, fascia and tendon of lower back, initial encounter: Secondary | ICD-10-CM | POA: Diagnosis not present

## 2022-07-17 MED ORDER — KETOROLAC TROMETHAMINE 30 MG/ML IJ SOLN
30.0000 mg | Freq: Once | INTRAMUSCULAR | Status: AC
  Administered 2022-07-17: 30 mg via INTRAMUSCULAR

## 2022-07-17 MED ORDER — DEXAMETHASONE SODIUM PHOSPHATE 10 MG/ML IJ SOLN
10.0000 mg | INTRAMUSCULAR | Status: AC
  Administered 2022-07-17: 10 mg via INTRAMUSCULAR

## 2022-07-17 MED ORDER — IBUPROFEN 800 MG PO TABS: 800.0000 mg | ORAL_TABLET | Freq: Three times a day (TID) | ORAL | 0 refills | Status: DC | PRN

## 2022-07-17 MED ORDER — METHOCARBAMOL 500 MG PO TABS: 500.0000 mg | ORAL_TABLET | Freq: Two times a day (BID) | ORAL | 0 refills | Status: DC

## 2022-07-17 NOTE — ED Triage Notes (Signed)
Pt reports lower back pain x 4 days.Pt reports pain started after someone pick her up. Pt has not taken any medications for complaint.

## 2022-07-17 NOTE — ED Provider Notes (Signed)
[facility]    CSN: [id number] Arrival date & time: 07/17/22  1655      History   Chief Complaint Chief Complaint  Patient presents with   Back Pain    HPI Hannah Lynch is a 51 y.o. female.   The history is provided by the patient.   Patient presents with a 4-day history of lumbar back pain.  Patient states she was playing around with a friend when she picked her up and since that time patient has had pain in the lumbar spine.  Patient states pain is more localized to the right side of her back.  She has tenderness to the right lumbar and mid lumbar region of her back. Pain worsens with twisting, turning, bending, and when getting out of bed.  She denies any numbness, tingling, radiation of pain, lower extremity weakness, or loss of bowel or bladder function.  Patient reports a history of a lumbar fusion approximately 1 year ago.  She has not taken any medication for her symptoms.  Past Medical History:  Diagnosis Date   Anxiety    Degenerative disc disease, lumbar    Degenerative disc disease, lumbar 2012   lumbar and cervical region   Depression    Endometriosis    Family history of adverse reaction to anesthesia    pt's mother has hx. 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 08/30/2020   Pain in left knee 10/12/2019   Obesity (BMI 30-39.9) 09/27/2019   GERD (gastroesophageal reflux disease) 09/27/2019   Essential hypertension 09/27/2019   Fatty liver 09/27/2019   Metabolic syndrome 09/27/2019   Pain in right ankle and joints of right foot 04/13/2019   Chronic cholecystitis    Primary insomnia 06/25/2017    Papanicolaou smear for cervical cancer screening 06/25/2017   Bilateral hearing loss 06/25/2017   Acute serous otitis media 06/25/2017   Lumbar stenosis with neurogenic claudication 09/25/2014    Past Surgical History:  Procedure Laterality Date   ABDOMINAL HYSTERECTOMY  2005   partial - TAH, no cervix, has both ovaries; done for endometriosis   ANTERIOR CERVICAL DECOMP/DISCECTOMY FUSION  03/25/2007   C4-5   CERVICAL FUSION  2013   CHOLECYSTECTOMY N/A 03/26/2018   Procedure: LAPAROSCOPIC CHOLECYSTECTOMY;  Surgeon: Franky Macho, MD;  Location: [facility];  Service: General;  Laterality: N/A;   KNEE ARTHROSCOPY Right    LAPAROSCOPIC GASTRIC SLEEVE RESECTION N/A 09/27/2019   Procedure: LAPAROSCOPIC GASTRIC SLEEVE RESECTION, HIATAL HERNIA REPARI ;Upper Endo, Eras Pathway;  Surgeon: Gaynelle Adu, MD;  Location: [facility];  Service: General;  Laterality: N/A;   LUMBAR LAMINECTOMY/DECOMPRESSION MICRODISCECTOMY  11/21/2004   L4-5   MAXIMUM ACCESS (MAS)POSTERIOR LUMBAR INTERBODY FUSION (PLIF) 1 LEVEL N/A 09/25/2014   Procedure: Lumbar four-five MAXIMUM ACCESS (MAS) POSTERIOR LUMBAR INTERBODY FUSION (PLIF) 1 LEVEL;  Surgeon: Maeola Harman, MD;  Location: [facility];  Service: Neurosurgery;  Laterality: N/A;   NASAL SEPTOPLASTY W/ TURBINOPLASTY N/A 12/07/2017   Procedure: NASAL SEPTOPLASTY WITH BILATERAL TURBINATE REDUCTION;  Surgeon: Christia Reading, MD;  Location: [facility];  Service: ENT;  Laterality:  N/A;   ORIF ANKLE FRACTURE Right    TONSILLECTOMY  06/2018   UPPER GI ENDOSCOPY      OB History     Gravida  2   Para      Term      Preterm      AB      Living         SAB      IAB      Ectopic      Multiple      Live Births               Home Medications    Prior to Admission medications   Medication Sig Start Date End Date Taking? Authorizing Provider  ibuprofen (ADVIL) 800 MG tablet Take 1 tablet (800 mg total) by mouth every 8 (eight) hours as  needed. 07/17/22  Yes Thurza Kwiecinski-Warren, Sadie Haber, NP  methocarbamol (ROBAXIN) 500 MG tablet Take 1 tablet (500 mg total) by mouth 2 (two) times daily. 07/17/22  Yes Quadir Muns-Warren, Sadie Haber, NP  cyclobenzaprine (FLEXERIL) 10 MG tablet TAKE 1 TABLET BY MOUTH EVERY 8 HOURS AS NEEDED FOR MUSCLE SPASM 06/02/22   Particia Nearing, PA-C  hyoscyamine (ANASPAZ) 0.125 MG TBDP disintergrating tablet Allow 1 tablet to dissolve on the tongue every 6 hours as needed 01/27/22     levothyroxine (SYNTHROID) 25 MCG tablet Take 1 tablet by mouth once daily 11/22/21     metoprolol succinate (TOPROL-XL) 25 MG 24 hr tablet Take one tablet (25 mg dose) by mouth daily. 02/06/22     montelukast (SINGULAIR) 10 MG tablet Take one tablet (10 mg dose) by mouth daily. 02/28/22     Multiple Vitamin (MULTIVITAMIN) tablet Take 1 tablet by mouth daily.    [provider]  pantoprazole (PROTONIX) 40 MG tablet Take one tablet (40 mg dose) by mouth daily. 02/06/22     predniSONE (DELTASONE) 50 MG tablet Take 1 tablet (50 mg total) by mouth daily with breakfast. 06/08/22   Wallis Bamberg, PA-C  rifaximin (XIFAXAN) 550 MG TABS tablet Take 1 tablet by mouth three times a day for 14 days 04/18/22     tiZANidine (ZANAFLEX) 4 MG tablet Take 1 tablet (4 mg total) by mouth at bedtime. 06/08/22   Wallis Bamberg, PA-C  topiramate (TOPAMAX) 25 MG tablet Take 1 tablet by mouth daily for 1 week, then 1 tab twice daily for 1 week, then 1 tab in the morning and 2 tabs in evening for 1 week, then 2 tabs in the morning and 2 tabs in the evening thereafter. 06/23/22     traZODone (DESYREL) 150 MG tablet Take one tablet (150 mg dose) by mouth at bedtime. 02/28/22       Family History Family History  Problem Relation Age of Onset   Hypertension Mother    Diabetes Mother    Heart failure Father    Hypertension Father    Diabetes Father    Heart failure Other    Breast cancer Paternal Grandmother     Social History Social History   Tobacco Use    Smoking status: Never   Smokeless tobacco: Never  Vaping Use   Vaping Use: Never used  Substance Use Topics   Alcohol use: Yes    Comment: once in awhile   Drug use: Never     Allergies   Patient has no known allergies.   Review of Systems Review of Systems Or HPI  Physical Exam Triage Vital Signs ED  Triage Vitals  Enc Vitals Group     BP 07/17/22 1753 116/82     Pulse Rate 07/17/22 1753 76     Resp 07/17/22 1753 16     Temp 07/17/22 1753 (!) 97.5 F (36.4 C)     Temp Source 07/17/22 1753 Oral     SpO2 07/17/22 1753 98 %     Weight --      Height --      Head Circumference --      Peak Flow --      Pain Score 07/17/22 1755 7     Pain Loc --      Pain Edu? --      Excl. in GC? --    No data found.  Updated Vital Signs BP 116/82 (BP Location: Right Arm)   Pulse 76   Temp (!) 97.5 F (36.4 C) (Oral)   Resp 16   SpO2 98%   Visual Acuity Right Eye Distance:   Left Eye Distance:   Bilateral Distance:    Right Eye Near:   Left Eye Near:    Bilateral Near:     Physical Exam Vitals and nursing note reviewed.  Constitutional:      General: She is not in acute distress.    Appearance: Normal appearance.  Cardiovascular:     Rate and Rhythm: Normal rate and regular rhythm.     Pulses: Normal pulses.     Heart sounds: Normal heart sounds.  Pulmonary:     Effort: Pulmonary effort is normal.     Breath sounds: Normal breath sounds.  Abdominal:     General: Bowel sounds are normal.     Palpations: Abdomen is soft.     Tenderness: There is no right CVA tenderness or left CVA tenderness.  Musculoskeletal:     Lumbar back: Tenderness present. No swelling, deformity, signs of trauma or lacerations. Decreased range of motion.  Skin:    General: Skin is warm and dry.  Neurological:     General: No focal deficit present.     Mental Status: She is alert and oriented to person, place, and time.  Psychiatric:        Mood and Affect: Mood normal.         Behavior: Behavior normal.      UC Treatments / Results  Labs (all labs ordered are listed, but only abnormal results are displayed) Labs Reviewed - No data to display  EKG   Radiology DG Lumbar Spine Complete  Result Date: 07/17/2022 CLINICAL DATA:  Low back pain for 4 days.  Prior lumbar surgery. EXAM: LUMBAR SPINE - COMPLETE 4+ VIEW COMPARISON:  Radiograph 11/11/2017 FINDINGS: Five non-rib-bearing lumbar vertebra. Posterior rod with intrapedicular screw fusion L4 through S1 with interbody spacers in place. The hardware is intact. Disc space narrowing and spurring at L3-L4, similar. Disc space narrowing and spurring with trace retrolisthesis at L1-L2, similar. There is moderate L1-L2, L2-L3, and L3-L4 facet hypertrophy. No fracture or acute osseous findings. IMPRESSION: 1. No acute radiographic findings. 2. Status post L4 through S1 posterior fusion without evidence of hardware complication. 3. Multilevel degenerative disc disease and facet hypertrophy. Electronically Signed   By: Narda Rutherford M.D.   On: 07/17/2022 18:38    Procedures Procedures (including critical care time)  Medications Ordered in UC Medications  dexamethasone (DECADRON) injection 10 mg (10 mg Intramuscular Given 07/17/22 1826)  ketorolac (TORADOL) 30 MG/ML injection 30 mg (30 mg Intramuscular Given 07/17/22 1826)  Initial Impression / Assessment and Plan / UC Course  I have reviewed the triage vital signs and the nursing notes.  Pertinent labs & imaging results that were available during my care of the patient were reviewed by me and considered in my medical decision making (see chart for details).  Patient presents for complaints of lumbar pain that has been present for the past 4 days.  On exam, she has tenderness to the lumbar spine and to the right paraspinal muscles.  There is no obvious deformity, swelling, or ecchymosis present.  She does have decreased range of motion due to pain.  Previous history of  lumbar surgery.  X-rays show facet hypertrophy from L1-L2 L2-L3 and L3-L4.  There is no hardware complication noted on the exam.  Patient was given Decadron 10 mg and Toradol 30 mg IM injections in the clinic today.  Symptoms are consistent with a lumbar sprain.  Supportive care recommendations were provided to the patient with strict indications of when to go to the emergency department.  Patient advised to follow-up with her surgeon or orthopedics if symptoms worsen or fail to improve. Final Clinical Impressions(s) / UC Diagnoses   Final diagnoses:  Lumbar strain, initial encounter     Discharge Instructions      The x-rays are negative for hardware complication.  The x-ray does show degenerative disc disease and facet hypertrophy. Take medication as prescribed. Gentle stretching and range of motion exercises while symptoms persist. Go to the emergency department immediately if you develop bowel or bladder function, numbness or tingling in your legs or feet, inability to walk, or weakness.   Follow-up with your orthopedic or surgeon if symptoms fail to improve.     ED Prescriptions     Medication Sig Dispense Auth. Provider   ibuprofen (ADVIL) 800 MG tablet Take 1 tablet (800 mg total) by mouth every 8 (eight) hours as needed. 30 tablet Kirklin Mcduffee-Warren, Sadie Haber, NP   methocarbamol (ROBAXIN) 500 MG tablet Take 1 tablet (500 mg total) by mouth 2 (two) times daily. 20 tablet Maan Zarcone-Warren, Sadie Haber, NP      PDMP not reviewed this encounter.   Abran Cantor, NP 07/17/22 1851

## 2022-07-17 NOTE — Discharge Instructions (Addendum)
The x-rays are negative for hardware complication.  The x-ray does show degenerative disc disease and facet hypertrophy. Take medication as prescribed. Gentle stretching and range of motion exercises while symptoms persist. Go to the emergency department immediately if you develop bowel or bladder function, numbness or tingling in your legs or feet, inability to walk, or weakness.   Follow-up with your orthopedic or surgeon if symptoms fail to improve.

## 2022-07-21 ENCOUNTER — Other Ambulatory Visit (HOSPITAL_COMMUNITY): Payer: Self-pay

## 2022-08-11 ENCOUNTER — Other Ambulatory Visit (HOSPITAL_COMMUNITY): Payer: Self-pay

## 2022-08-11 MED ORDER — MELOXICAM 15 MG PO TABS
15.0000 mg | ORAL_TABLET | Freq: Every day | ORAL | 0 refills | Status: DC
  Filled 2022-08-11: qty 30, 30d supply, fill #0

## 2022-08-11 MED ORDER — TIZANIDINE HCL 4 MG PO TABS
4.0000 mg | ORAL_TABLET | Freq: Three times a day (TID) | ORAL | 1 refills | Status: DC
  Filled 2022-08-11: qty 90, 30d supply, fill #0
  Filled 2022-09-02 – 2022-09-08 (×2): qty 90, 30d supply, fill #1

## 2022-08-13 ENCOUNTER — Other Ambulatory Visit (HOSPITAL_COMMUNITY): Payer: Self-pay

## 2022-08-14 ENCOUNTER — Other Ambulatory Visit (HOSPITAL_COMMUNITY): Payer: Self-pay

## 2022-08-14 MED ORDER — CYCLOBENZAPRINE HCL 10 MG PO TABS
ORAL_TABLET | ORAL | 0 refills | Status: DC
  Filled 2022-08-14: qty 30, 10d supply, fill #0

## 2022-08-17 ENCOUNTER — Other Ambulatory Visit (HOSPITAL_COMMUNITY): Payer: Self-pay

## 2022-08-18 ENCOUNTER — Other Ambulatory Visit (HOSPITAL_COMMUNITY): Payer: Self-pay

## 2022-08-18 MED ORDER — TOPIRAMATE 25 MG PO TABS
ORAL_TABLET | ORAL | 0 refills | Status: DC
  Filled 2022-08-18: qty 120, 40d supply, fill #0

## 2022-08-20 ENCOUNTER — Ambulatory Visit (INDEPENDENT_AMBULATORY_CARE_PROVIDER_SITE_OTHER): Payer: 59

## 2022-08-20 ENCOUNTER — Ambulatory Visit (INDEPENDENT_AMBULATORY_CARE_PROVIDER_SITE_OTHER): Payer: 59 | Admitting: Orthopaedic Surgery

## 2022-08-20 ENCOUNTER — Encounter: Payer: Self-pay | Admitting: Orthopaedic Surgery

## 2022-08-20 DIAGNOSIS — M25511 Pain in right shoulder: Secondary | ICD-10-CM

## 2022-08-20 DIAGNOSIS — M25531 Pain in right wrist: Secondary | ICD-10-CM | POA: Insufficient documentation

## 2022-08-20 DIAGNOSIS — M7541 Impingement syndrome of right shoulder: Secondary | ICD-10-CM

## 2022-08-20 HISTORY — DX: Impingement syndrome of right shoulder: M75.41

## 2022-08-20 MED ORDER — METHYLPREDNISOLONE ACETATE 40 MG/ML IJ SUSP
80.0000 mg | INTRAMUSCULAR | Status: AC | PRN
  Administered 2022-08-20: 80 mg via INTRA_ARTICULAR

## 2022-08-20 MED ORDER — LIDOCAINE HCL 1 % IJ SOLN
2.0000 mL | INTRAMUSCULAR | Status: AC | PRN
  Administered 2022-08-20: 2 mL

## 2022-08-20 MED ORDER — BUPIVACAINE HCL 0.25 % IJ SOLN
2.0000 mL | INTRAMUSCULAR | Status: AC | PRN
  Administered 2022-08-20: 2 mL via INTRA_ARTICULAR

## 2022-08-20 NOTE — Progress Notes (Signed)
Office Visit Note   Patient: Hannah Lynch           Date of Birth: 1971-03-14           MRN: [id number] Visit Date: 08/20/2022              Requested by: Eartha Inch, MD [address] [place],  [place] [address] PCP: Eartha Inch, MD   Assessment & Plan: Visit Diagnoses:  1. Pain in right wrist   2. Acute pain of right shoulder   3. Impingement syndrome of right shoulder     Plan: Ms. Arvanitis  is a pleasant 51 year old woman who comes in today for 2 reasons.  1 she has a chief complaint of right shoulder pain x2 weeks.  Denies any specific injury.  She also has pain in her right wrist on the volar radial surface that is been going on for about a month.  Again no particular injury.  Has not really had any treatment for this.  Denies any neck pain today.  X-rays of her shoulder show hypertrophic bone formation at the [facility] joint with subchondral sclerotic changes.  Exam of her right shoulder is consistent with impingement findings.  We recommended a cortisone injection today and she is amenable to this.  There is right at the [facility] joint where she was most tender.  X-rays of her right wrist show calcifications on the volar radial aspect of the wrist fairly superficial. This is where she also has tenderness.  Raises the question of myositis versus a calcified hematoma.  Have recommended an MRI for further evaluation.  She will follow-up after the MRI is completed and that time we can review her success with the injection into her shoulder  Follow-Up Instructions: Return in about 3 weeks (around 09/10/2022), or After MRI scan right wrist.   Orders:  Orders Placed This Encounter  Procedures   Large Joint Inj: R subacromial bursa   XR Shoulder Right   XR Wrist Complete Right   MR Wrist Right w/o contrast   No orders of the defined types were placed in this encounter.     Procedures: Large Joint Inj: R subacromial bursa on 08/20/2022 1:38 PM Indications: diagnostic evaluation and  pain Details: 25 G 1.5 in needle, anterior approach  Arthrogram: No  Medications: 2 mL lidocaine 1 %; 80 mg methylPREDNISolone acetate 40 MG/ML; 2 mL bupivacaine 0.25 % Outcome: tolerated well, no immediate complications Procedure, treatment alternatives, risks and benefits explained, specific risks discussed. Consent was given by the patient.      Clinical Data: No additional findings.   Subjective: Chief Complaint  Patient presents with   Right Shoulder - Pain   Right Wrist - Pain    HPI Pleasant 51 year old woman with a 2-week history of right shoulder pain especially when she externally rotates her shoulder and raises her arm above her head no particular injury.  Also a 1 month history of right radial volar wrist pain.  She feels like it she can feel a "bone "there.  No injury. Review of Systems  All other systems reviewed and are negative.    Objective: Vital Signs: There were no vitals taken for this visit.  Physical Exam Constitutional:      Appearance: Normal appearance.  Pulmonary:     Effort: Pulmonary effort is normal.  Neurological:     General: No focal deficit present.     Mental Status: She is alert.     Ortho  Exam Right shoulder she has full forward elevation though painful and full elevation.  She has recreated pain with external rotation.  Negative empty can test but she does have impingement findings.  She is neurovascularly intact distally.  Strength is 5 out of 5 with resisted abduction and external rotation.  Tender over the [facility] joint to palpation Right wrist she has a palpable radial pulse without difficulty no redness no erythema no swelling she is able to oppose all her fingers.  She is focally tender on the volar radial surface just proximal to the thumb but not at the [facility] joint.  More in the soft tissue.  Brisk capillary refill Specialty Comments:  No specialty comments available.  Imaging: XR Shoulder Right  Result Date: 08/20/2022 As  of the right shoulder were obtained in 3 projections.  There are bulky degenerative changes at the [facility] joint with inferiorly and superiorly directed osteophytes on both sides of the joint.  There are some subchondral cysts on the distal acromion.  No other ectopic calcification this does correspond to the area of pain.  Humeral head is centered in the glenoid.  Normal space between the humeral head and the acromion  XR Wrist Complete Right  Result Date: 08/20/2022 Films of the right wrist were obtained in several projections.  There is some ectopic calcification on the volar surface of the proximal carpus.  This could be a organized hematoma or possibly vascular in origin.  There is been no history of injury or trauma.  No other acute changes in the carpus.  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 08/30/2020   Pain in left knee 10/12/2019   Obesity (BMI 30-39.9) 09/27/2019   GERD (gastroesophageal reflux disease) 09/27/2019   Essential hypertension 09/27/2019   Fatty liver 09/27/2019   Metabolic syndrome 09/27/2019   Pain in right ankle and joints of right foot 04/13/2019   Chronic cholecystitis    Primary insomnia 06/25/2017   Papanicolaou smear for cervical cancer screening 06/25/2017   Bilateral hearing loss 06/25/2017   Acute serous otitis media 06/25/2017   Lumbar stenosis with neurogenic claudication 09/25/2014   Past Medical History:  Diagnosis Date   Anxiety    Degenerative disc disease, lumbar    Degenerative disc disease, lumbar 2012   lumbar and cervical region   Depression    Endometriosis    Family history of adverse reaction to anesthesia    pt's mother has hx. of post-op N/V   Headache     Hypertension    Hypothyroidism    IBS (irritable bowel syndrome)    Nasal septal deviation 11/2017   Nasal turbinate hypertrophy 11/2017   PONV (postoperative nausea and vomiting)    Snoring 11/2017    Family History  Problem Relation Age of Onset   Hypertension Mother    Diabetes Mother    Heart failure Father    Hypertension Father    Diabetes Father    Heart failure Other    Breast cancer Paternal Grandmother     Past Surgical History:  Procedure Laterality Date   ABDOMINAL HYSTERECTOMY  2005   partial - [facility], no cervix, has both ovaries; done for endometriosis   ANTERIOR  CERVICAL DECOMP/DISCECTOMY FUSION  03/25/2007   C4-5   CERVICAL FUSION  2013   CHOLECYSTECTOMY N/A 03/26/2018   Procedure: LAPAROSCOPIC CHOLECYSTECTOMY;  Surgeon: Aviva Signs, MD;  Location: [facility];  Service: General;  Laterality: N/A;   KNEE ARTHROSCOPY Right    LAPAROSCOPIC GASTRIC SLEEVE RESECTION N/A 09/27/2019   Procedure: LAPAROSCOPIC GASTRIC SLEEVE RESECTION, HIATAL HERNIA REPARI ;Upper Endo, Eras Pathway;  Surgeon: Greer Pickerel, MD;  Location: [facility];  Service: General;  Laterality: N/A;   LUMBAR LAMINECTOMY/DECOMPRESSION MICRODISCECTOMY  11/21/2004   L4-5   MAXIMUM ACCESS (MAS)POSTERIOR LUMBAR INTERBODY FUSION (PLIF) 1 LEVEL N/A 09/25/2014   Procedure: Lumbar four-five MAXIMUM ACCESS (MAS) POSTERIOR LUMBAR INTERBODY FUSION (PLIF) 1 LEVEL;  Surgeon: Erline Levine, MD;  Location: [place] [facility];  Service: Neurosurgery;  Laterality: N/A;   NASAL SEPTOPLASTY W/ TURBINOPLASTY N/A 12/07/2017   Procedure: NASAL SEPTOPLASTY WITH BILATERAL TURBINATE REDUCTION;  Surgeon: Melida Quitter, MD;  Location: [facility];  Service: ENT;  Laterality: N/A;   ORIF ANKLE FRACTURE Right    TONSILLECTOMY  06/2018   UPPER GI ENDOSCOPY     Social History   Occupational History   Not on file  Tobacco Use   Smoking status: Never   Smokeless tobacco: Never  Vaping Use   Vaping Use: Never used  Substance  and Sexual Activity   Alcohol use: Yes    Comment: once in awhile   Drug use: Never   Sexual activity: Yes    Birth control/protection: Surgical    Comment: hyst

## 2022-08-28 ENCOUNTER — Other Ambulatory Visit (HOSPITAL_BASED_OUTPATIENT_CLINIC_OR_DEPARTMENT_OTHER): Payer: Self-pay

## 2022-08-28 ENCOUNTER — Other Ambulatory Visit: Payer: Self-pay

## 2022-08-28 ENCOUNTER — Emergency Department (HOSPITAL_BASED_OUTPATIENT_CLINIC_OR_DEPARTMENT_OTHER): Payer: 59

## 2022-08-28 ENCOUNTER — Emergency Department (HOSPITAL_BASED_OUTPATIENT_CLINIC_OR_DEPARTMENT_OTHER)
Admission: EM | Admit: 2022-08-28 | Discharge: 2022-08-28 | Disposition: A | Discharge status: Home / Self Care | Payer: 59 | Attending: Emergency Medicine | Admitting: Emergency Medicine

## 2022-08-28 ENCOUNTER — Encounter (HOSPITAL_BASED_OUTPATIENT_CLINIC_OR_DEPARTMENT_OTHER): Payer: Self-pay

## 2022-08-28 DIAGNOSIS — R112 Nausea with vomiting, unspecified: Secondary | ICD-10-CM | POA: Diagnosis not present

## 2022-08-28 DIAGNOSIS — R7989 Other specified abnormal findings of blood chemistry: Secondary | ICD-10-CM | POA: Diagnosis not present

## 2022-08-28 DIAGNOSIS — R630 Anorexia: Secondary | ICD-10-CM | POA: Insufficient documentation

## 2022-08-28 DIAGNOSIS — Z79899 Other long term (current) drug therapy: Secondary | ICD-10-CM | POA: Insufficient documentation

## 2022-08-28 DIAGNOSIS — R1031 Right lower quadrant pain: Secondary | ICD-10-CM | POA: Diagnosis present

## 2022-08-28 DIAGNOSIS — I10 Essential (primary) hypertension: Secondary | ICD-10-CM | POA: Diagnosis not present

## 2022-08-28 DIAGNOSIS — E039 Hypothyroidism, unspecified: Secondary | ICD-10-CM | POA: Insufficient documentation

## 2022-08-28 LAB — URINALYSIS, ROUTINE W REFLEX MICROSCOPIC
Bilirubin Urine: NEGATIVE
Glucose, UA: NEGATIVE mg/dL
Hgb urine dipstick: NEGATIVE
Ketones, ur: NEGATIVE mg/dL
Leukocytes,Ua: NEGATIVE
Nitrite: NEGATIVE
Protein, ur: NEGATIVE mg/dL
Specific Gravity, Urine: 1.01 (ref 1.005–1.030)
pH: 5.5 (ref 5.0–8.0)

## 2022-08-28 LAB — COMPREHENSIVE METABOLIC PANEL
ALT: 12 U/L (ref 0–44)
AST: 14 U/L — ABNORMAL LOW (ref 15–41)
Albumin: 4.2 g/dL (ref 3.5–5.0)
Alkaline Phosphatase: 55 U/L (ref 38–126)
Anion gap: 6 (ref 5–15)
BUN: 7 mg/dL (ref 6–20)
CO2: 26 mmol/L (ref 22–32)
Calcium: 9.3 mg/dL (ref 8.9–10.3)
Chloride: 107 mmol/L (ref 98–111)
Creatinine, Ser: 1.01 mg/dL — ABNORMAL HIGH (ref 0.44–1.00)
GFR, Estimated: >60 mL/min (ref >60)
Glucose, Bld: 102 mg/dL — ABNORMAL HIGH (ref 70–99)
Potassium: 3.7 mmol/L (ref 3.5–5.1)
Sodium: 139 mmol/L (ref 135–145)
Total Bilirubin: 0.4 mg/dL (ref 0.3–1.2)
Total Protein: 6.8 g/dL (ref 6.5–8.1)

## 2022-08-28 LAB — CBC
HCT: 38.7 % (ref 36.0–46.0)
Hemoglobin: 13.2 g/dL (ref 12.0–15.0)
MCH: 32.2 pg (ref 26.0–34.0)
MCHC: 34.1 g/dL (ref 30.0–36.0)
MCV: 94.4 fL (ref 80.0–100.0)
Platelets: 233 10*3/uL (ref 150–400)
RBC: 4.1 MIL/uL (ref 3.87–5.11)
RDW: 13 % (ref 11.5–15.5)
WBC: 7.3 10*3/uL (ref 4.0–10.5)
nRBC: 0 % (ref 0.0–0.2)

## 2022-08-28 LAB — PREGNANCY, URINE: Preg Test, Ur: NEGATIVE

## 2022-08-28 LAB — LIPASE, BLOOD: Lipase: 42 U/L (ref 11–51)

## 2022-08-28 MED ORDER — SODIUM CHLORIDE 0.9 % IV BOLUS
1000.0000 mL | Freq: Once | INTRAVENOUS | Status: AC
  Administered 2022-08-28: 1000 mL via INTRAVENOUS

## 2022-08-28 MED ORDER — ONDANSETRON HCL 4 MG/2ML IJ SOLN
4.0000 mg | Freq: Once | INTRAMUSCULAR | Status: AC
  Administered 2022-08-28: 4 mg via INTRAVENOUS
  Filled 2022-08-28: qty 2

## 2022-08-28 MED ORDER — IOHEXOL 300 MG/ML  SOLN
80.0000 mL | Freq: Once | INTRAMUSCULAR | Status: AC | PRN
  Administered 2022-08-28: 80 mL via INTRAVENOUS

## 2022-08-28 MED ORDER — ONDANSETRON 4 MG PO TBDP
4.0000 mg | ORAL_TABLET | Freq: Three times a day (TID) | ORAL | 0 refills | Status: DC | PRN
  Filled 2022-08-28: qty 12, 4d supply, fill #0

## 2022-08-28 MED ORDER — MORPHINE SULFATE (PF) 4 MG/ML IV SOLN
4.0000 mg | Freq: Once | INTRAVENOUS | Status: AC
  Administered 2022-08-28: 4 mg via INTRAVENOUS
  Filled 2022-08-28: qty 1

## 2022-08-28 NOTE — ED Provider Notes (Signed)
[facility] EMERGENCY DEPT Provider Note   CSN: [id number] Arrival date & time: 08/28/22  1257     History  Chief Complaint  Patient presents with   Abdominal Pain    Hannah Lynch is a 51 y.o. female.  51 year old female with history of cholecystectomy, GERD, hysterectomy (partial, trans abdominal), hypothyroid, complaint of abdominal pain x 4 days, with nausea and vomiting, anorexia. Epigastric at onset, now RLQ with associated diarrhea. Pain is constant, sharp, worse with movement. Denies fevers, chills.  Has a holter monitor on currently for cards work up for CP and palpitations, no symptoms at this time.       Home Medications Prior to Admission medications   Medication Sig Start Date End Date Taking? Authorizing Provider  ondansetron (ZOFRAN-ODT) 4 MG disintegrating tablet Take 1 tablet (4 mg total) by mouth every 8 (eight) hours as needed for nausea or vomiting. 08/28/22  Yes Jeannie Fend, PA-C  cyclobenzaprine (FLEXERIL) 10 MG tablet TAKE 1 TABLET BY MOUTH EVERY 8 HOURS AS NEEDED FOR MUSCLE SPASM 06/02/22   Particia Nearing, PA-C  cyclobenzaprine (FLEXERIL) 10 MG tablet Take one tablet (10 mg dose) by mouth 3 (three) times a day as needed for Muscle spasms for up to 10 days. 08/14/22     hyoscyamine (ANASPAZ) 0.125 MG TBDP disintergrating tablet Allow 1 tablet to dissolve on the tongue every 6 hours as needed 01/27/22     ibuprofen (ADVIL) 800 MG tablet Take 1 tablet (800 mg total) by mouth every 8 (eight) hours as needed. 07/17/22   Leath-Warren, Sadie Haber, NP  levothyroxine (SYNTHROID) 25 MCG tablet Take 1 tablet by mouth once daily 11/22/21     meloxicam (MOBIC) 15 MG tablet Take 1 tablet (15 mg total) by mouth daily. 08/11/22     methocarbamol (ROBAXIN) 500 MG tablet Take 1 tablet (500 mg total) by mouth 2 (two) times daily. 07/17/22   Leath-Warren, Sadie Haber, NP  metoprolol succinate (TOPROL-XL) 25 MG 24 hr tablet Take one tablet (25 mg dose) by  mouth daily. 02/06/22     montelukast (SINGULAIR) 10 MG tablet Take one tablet (10 mg dose) by mouth daily. 02/28/22     Multiple Vitamin (MULTIVITAMIN) tablet Take 1 tablet by mouth daily.    [provider]  predniSONE (DELTASONE) 50 MG tablet Take 1 tablet (50 mg total) by mouth daily with breakfast. 06/08/22   Wallis Bamberg, PA-C  rifaximin (XIFAXAN) 550 MG TABS tablet Take 1 tablet by mouth three times a day for 14 days 04/18/22     tiZANidine (ZANAFLEX) 4 MG tablet Take 1 tablet (4 mg total) by mouth at bedtime. 06/08/22   Wallis Bamberg, PA-C  tiZANidine (ZANAFLEX) 4 MG tablet Take 1 tablet (4 mg total) by mouth 3 (three) times daily. 08/11/22     topiramate (TOPAMAX) 25 MG tablet Take 1 tablet by mouth daily for 1 week, then 1 tab twice daily for 1 week, then 1 tab in the morning and 2 tabs in evening for 1 week, then 2 tabs in the morning and 2 tabs in the evening thereafter. 08/17/22 09/27/22    traZODone (DESYREL) 150 MG tablet Take one tablet (150 mg dose) by mouth at bedtime. 02/28/22     pantoprazole (PROTONIX) 40 MG tablet Take one tablet (40 mg dose) by mouth daily. 02/06/22 07/23/22        Allergies    Patient has no known allergies.    Review of Systems  Review of Systems Negative except as per HPI Physical Exam Updated Vital Signs BP (!) 106/98 (BP Location: Right Arm)   Pulse 70   Temp 98.2 F (36.8 C) (Oral)   Resp 18   Ht 5\' 4"  (1.626 m)   Wt 74.6 kg   SpO2 99%   BMI 28.23 kg/m  Physical Exam Vitals and nursing note reviewed.  Constitutional:      General: She is not in acute distress.    Appearance: She is well-developed. She is not diaphoretic.  HENT:     Head: Normocephalic and atraumatic.  Cardiovascular:     Rate and Rhythm: Normal rate and regular rhythm.     Heart sounds: Normal heart sounds.  Pulmonary:     Effort: Pulmonary effort is normal.     Breath sounds: Normal breath sounds.  Abdominal:     General: Bowel sounds are normal.     Palpations:  Abdomen is soft.     Tenderness: There is abdominal tenderness in the right upper quadrant, right lower quadrant and left lower quadrant. There is rebound. Positive signs include Rovsing's sign and McBurney's sign.  Skin:    General: Skin is warm and dry.     Findings: No erythema or rash.  Neurological:     Mental Status: She is alert and oriented to person, place, and time.  Psychiatric:        Behavior: Behavior normal.     ED Results / Procedures / Treatments   Labs (all labs ordered are listed, but only abnormal results are displayed) Labs Reviewed  COMPREHENSIVE METABOLIC PANEL - Abnormal; Notable for the following components:      Result Value   Glucose, Bld 102 (*)    Creatinine, Ser 1.01 (*)    AST 14 (*)    All other components within normal limits  LIPASE, BLOOD  CBC  URINALYSIS, ROUTINE W REFLEX MICROSCOPIC  PREGNANCY, URINE    EKG None  Radiology CT Abdomen Pelvis W Contrast  Result Date: 08/28/2022 CLINICAL DATA:  Acute right lower quadrant abdominal pain. EXAM: CT ABDOMEN AND PELVIS WITH CONTRAST TECHNIQUE: Multidetector CT imaging of the abdomen and pelvis was performed using the standard protocol following bolus administration of intravenous contrast. RADIATION DOSE REDUCTION: This exam was performed according to the departmental dose-optimization program which includes automated exposure control, adjustment of the mA and/or kV according to patient size and/or use of iterative reconstruction technique. CONTRAST:  37mL OMNIPAQUE IOHEXOL 300 MG/ML  SOLN COMPARISON:  December 15, 2018. FINDINGS: Lower chest: No acute abnormality. Hepatobiliary: No focal liver abnormality is seen. Status post cholecystectomy. No biliary dilatation. Pancreas: Unremarkable. No pancreatic ductal dilatation or surrounding inflammatory changes. Spleen: Normal in size without focal abnormality. Adrenals/Urinary Tract: Adrenal glands are unremarkable. Kidneys are normal, without renal calculi,  focal lesion, or hydronephrosis. Bladder is unremarkable. Stomach/Bowel: Status post gastric bypass. There is no evidence of bowel obstruction or inflammation. The appendix is unremarkable. Vascular/Lymphatic: No significant vascular findings are present. No enlarged abdominal or pelvic lymph nodes. Reproductive: Status post hysterectomy. No adnexal masses. Other: No abdominal wall hernia or abnormality. No abdominopelvic ascites. Musculoskeletal: No acute or significant osseous findings. IMPRESSION: No acute abnormality seen in the abdomen or pelvis. Electronically Signed   By: December 17, 2018 M.D.   On: 08/28/2022 16:24    Procedures Procedures    Medications Ordered in ED Medications  ondansetron (ZOFRAN) injection 4 mg (4 mg Intravenous Given 08/28/22 1541)  sodium chloride 0.9 % bolus  1,000 mL (0 mLs Intravenous Stopped 08/28/22 1714)  morphine (PF) 4 MG/ML injection 4 mg (4 mg Intravenous Given 08/28/22 1540)  iohexol (OMNIPAQUE) 300 MG/ML solution 80 mL (80 mLs Intravenous Contrast Given 08/28/22 1550)    ED Course/ Medical Decision Making/ A&P                           Medical Decision Making Amount and/or Complexity of Data Reviewed Labs: ordered. Radiology: ordered.  Risk Prescription drug management.   This patient presents to the ED for concern of right lower quadrant abdominal pain, this involves an extensive number of treatment options, and is a complaint that carries with it a high risk of complications and morbidity.  The differential diagnosis includes but not limited to appendicitis, ovarian cyst, colitis, diverticulitis, kidney stone   Co morbidities that complicate the patient evaluation  Hypothyroid, hypertension, endometriosis, IBS   Additional history obtained:  External records from outside source obtained and reviewed including prior labs on file for comparison.   Lab Tests:  I Ordered, and personally interpreted labs.  The pertinent results include:  Urinalysis is unremarkable.  Pregnancy test negative.  Lipase normal.  CBC within normal limits.  CMP with mildly elevated creatinine otherwise unremarkable.   Imaging Studies ordered:  I ordered imaging studies including CT abdomen pelvis I independently visualized and interpreted imaging which showed no acute findings, normal appendix I agree with the radiologist interpretation   Problem List / ED Course / Critical interventions / Medication management  51 year old female presents with complaint of epigastric abdominal pain which has localized to right lower quadrant.  Found to have tenderness McBurney's point with positive Rovsing's and rebound tenderness.  She has had loss of appetite, nausea and vomiting.  Work-up today is largely reassuring.  Her labs are unremarkable, her CT shows a normal appendix, no other explanation for patient's symptoms today.  Discussed possibility of developing process and importance of recheck with PCP for serial abdominal exams and 24 to 48 hours with return to ER precautions provided.  Provided with prescription for Zofran for nausea and vomiting.  Patient is able to tolerate p.o. crackers and fluids prior to discharge. I ordered medication including morphine, Zofran, IV fluids for pain and nausea Reevaluation of the patient after these medicines showed that the patient improved I have reviewed the patients home medicines and have made adjustments as needed   Social Determinants of Health:  Has PCP for follow-up  Test / Admission - Considered:  after comprehensive ER evaluation today, felt stable for discharge to follow-up with primary care provider for recheck in 1 to 2 days for repeat abdominal exam.  Given return to ER precautions.  Discussed possibility of appendicitis although reassuring on today's work-up.         Final Clinical Impression(s) / ED Diagnoses Final diagnoses:  Right lower quadrant abdominal pain  Nausea and vomiting,  unspecified vomiting type    Rx / DC Orders ED Discharge Orders          Ordered    ondansetron (ZOFRAN-ODT) 4 MG disintegrating tablet  Every 8 hours PRN        08/28/22 1732              Tacy Learn, PA-C 08/28/22 1737    Charlesetta Shanks, MD 09/02/22 1540

## 2022-08-28 NOTE — ED Triage Notes (Signed)
Patient here POV from Home.  Endorses Worsening RLQ ABD Pain that began 4 Days ago. Associated with N/V  No Known Fevers. 2 Loose Stools.   NAD Noted during Triage. A&Ox4. GCS 15. Ambulatory.

## 2022-08-28 NOTE — ED Notes (Signed)
Pt tolerating PO challenge of ginger ale and graham crackers

## 2022-08-28 NOTE — Discharge Instructions (Addendum)
Return to the emergency room for worsening or concerning symptoms including worsening pain, vomiting not controlled with Zofran or fevers.  Recheck with your doctor in 1 to 2 days for repeat abdominal exam.

## 2022-09-02 ENCOUNTER — Other Ambulatory Visit (HOSPITAL_COMMUNITY): Payer: Self-pay

## 2022-09-02 MED ORDER — TOPIRAMATE 25 MG PO TABS
ORAL_TABLET | ORAL | 0 refills | Status: DC
  Filled 2022-09-02: qty 120, 30d supply, fill #0
  Filled 2022-09-27: qty 120, 40d supply, fill #0

## 2022-09-02 MED ORDER — TRAZODONE HCL 150 MG PO TABS
150.0000 mg | ORAL_TABLET | Freq: Every evening | ORAL | 1 refills | Status: DC
  Filled 2022-09-02: qty 90, 90d supply, fill #0
  Filled 2022-11-22: qty 90, 90d supply, fill #1

## 2022-09-02 MED ORDER — LEVOTHYROXINE SODIUM 25 MCG PO TABS
25.0000 ug | ORAL_TABLET | Freq: Every day | ORAL | 2 refills | Status: DC
  Filled 2022-09-02: qty 90, 90d supply, fill #0
  Filled 2022-11-27: qty 90, 90d supply, fill #1
  Filled 2023-02-25: qty 90, 90d supply, fill #2

## 2022-09-03 ENCOUNTER — Other Ambulatory Visit (HOSPITAL_COMMUNITY): Payer: Self-pay

## 2022-09-08 ENCOUNTER — Telehealth: Payer: Self-pay | Admitting: Orthopaedic Surgery

## 2022-09-08 ENCOUNTER — Ambulatory Visit
Admission: RE | Admit: 2022-09-08 | Discharge: 2022-09-08 | Disposition: A | Discharge status: Home / Self Care | Payer: 59 | Source: Ambulatory Visit | Attending: Orthopaedic Surgery | Admitting: Orthopaedic Surgery

## 2022-09-08 ENCOUNTER — Other Ambulatory Visit (HOSPITAL_COMMUNITY): Payer: Self-pay

## 2022-09-08 DIAGNOSIS — M25531 Pain in right wrist: Secondary | ICD-10-CM

## 2022-09-08 NOTE — Telephone Encounter (Signed)
Called patient left message to return call to schedule an appointment for MRI review with Dr. Durward Fortes

## 2022-09-24 ENCOUNTER — Encounter: Payer: Self-pay | Admitting: Orthopaedic Surgery

## 2022-09-24 ENCOUNTER — Ambulatory Visit (INDEPENDENT_AMBULATORY_CARE_PROVIDER_SITE_OTHER): Payer: 59 | Admitting: Orthopaedic Surgery

## 2022-09-24 DIAGNOSIS — M25531 Pain in right wrist: Secondary | ICD-10-CM | POA: Diagnosis not present

## 2022-09-24 DIAGNOSIS — M7541 Impingement syndrome of right shoulder: Secondary | ICD-10-CM

## 2022-09-24 DIAGNOSIS — M25511 Pain in right shoulder: Secondary | ICD-10-CM

## 2022-09-24 NOTE — Progress Notes (Signed)
Office Visit Note   Patient: Hannah Lynch           Date of Birth: [birth year]           MRN: [id number] Visit Date: 09/24/2022              Requested by: Eartha Inch, MD [address] [place],  [place] [address] PCP: Eartha Inch, MD   Assessment & Plan: Visit Diagnoses:  1. Acute pain of right shoulder   2. Impingement syndrome of right shoulder   3. Pain in right wrist     Plan: Ms. Louderback had an MRI scan with and without contrast of the right wrist demonstrating a multilobulated ganglion cyst located along the volar surface of this of the ulnar styloid measuring 1.7 x 1.3 x 1.4 cm.  It does extend into the ulnar groove and slightly displaces the ECU tendon.  This was not palpable.  She did not have any ulnar nerve symptoms.  She is having a bit more pain on the volar surface near the radius and there was a trace synovitis with a very small ganglion cyst.  Discussed all of the above with her and do not think she needs any specific treatment.  All questions were answered.  She is having minimal symptoms and was just happy to know that there was not anything significant.  Also experiencing recurrent right shoulder pain.  She has evidence of impingement without loss of motion.  She has had prior cortisone injection with minimal relief.  I will order an MRI scan  Follow-Up Instructions: Return After MRI scan right shoulder.   Orders:  Orders Placed This Encounter  Procedures   MR SHOULDER RIGHT WO CONTRAST   No orders of the defined types were placed in this encounter.     Procedures: No procedures performed   Clinical Data: No additional findings.   Subjective: Chief Complaint  Patient presents with   Right Wrist - Pain, Follow-up  Returns for evaluation of recurrent right shoulder pain and to review the MRI scan of her right wrist  HPI  Review of Systems   Objective: Vital Signs: There were no vitals taken for this visit.  Physical  Exam Constitutional:      Appearance: She is well-developed.  Eyes:     Pupils: Pupils are equal, round, and reactive to light.  Pulmonary:     Effort: Pulmonary effort is normal.  Skin:    General: Skin is warm and dry.  Neurological:     Mental Status: She is alert and oriented to person, place, and time.  Psychiatric:        Behavior: Behavior normal.     Ortho Exam awake alert and oriented x3.  Comfortable sitting.  No acute distress.  Right wrist was not red or swollen.  No masses were palpated except for a very small 2 to 3 mm superficial mass on the volar surface of the distal radius.  No Tinel's.  Good pulses.  This probably correlates to the ganglion cyst identified by MRI scan.  She had full pronation supination flexion and extension.  Neurologically intact.  No masses or pain on the ulnar aspect of the wrist.  Right shoulder with positive impingement.  There was a minimally circuitous arc of overhead motion with full flexion and internal rotation.  Negative speeds sign.  Minimally positive empty can testing  Specialty Comments:  No specialty comments available.  Imaging: No results found.   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 08/30/2020   Pain in left knee 10/12/2019   Obesity (BMI 30-39.9) 09/27/2019   GERD (gastroesophageal reflux disease) 09/27/2019   Essential hypertension 09/27/2019   Fatty liver 09/27/2019   Metabolic syndrome 09/27/2019   Pain in right ankle and joints of right foot 04/13/2019   Chronic cholecystitis    Primary insomnia 06/25/2017   Papanicolaou smear for cervical cancer screening 06/25/2017   Bilateral hearing loss 06/25/2017   Acute serous otitis media 06/25/2017   Lumbar stenosis with neurogenic claudication 09/25/2014    Past Medical History:  Diagnosis Date   Anxiety    Degenerative disc disease, lumbar    Degenerative disc disease, lumbar 2012   lumbar and cervical region   Depression    Endometriosis    Family history of adverse reaction to anesthesia    pt's mother has hx. of post-op N/V   Headache    Hypertension    Hypothyroidism    IBS (irritable bowel syndrome)    Nasal septal deviation 11/2017   Nasal turbinate hypertrophy 11/2017   PONV (postoperative nausea and vomiting)    Snoring 11/2017    Family History  Problem Relation Age of Onset   Hypertension Mother    Diabetes Mother    Heart failure Father    Hypertension Father    Diabetes Father    Heart failure Other    Breast cancer Paternal Grandmother     Past Surgical History:  Procedure Laterality Date   ABDOMINAL HYSTERECTOMY  2005   partial - TAH, no cervix, has both ovaries; done for endometriosis   ANTERIOR CERVICAL DECOMP/DISCECTOMY FUSION  03/25/2007   C4-5   CERVICAL FUSION  2013   CHOLECYSTECTOMY N/A 03/26/2018   Procedure: LAPAROSCOPIC CHOLECYSTECTOMY;  Surgeon: Franky Macho, MD;  Location: [facility];  Service: General;  Laterality: N/A;   KNEE ARTHROSCOPY Right    LAPAROSCOPIC GASTRIC SLEEVE RESECTION N/A 09/27/2019   Procedure: LAPAROSCOPIC GASTRIC SLEEVE RESECTION, HIATAL HERNIA REPARI ;Upper Endo, Eras Pathway;  Surgeon: Gaynelle Adu, MD;  Location: [facility];  Service: General;  Laterality: N/A;   LUMBAR LAMINECTOMY/DECOMPRESSION MICRODISCECTOMY  11/21/2004   L4-5   MAXIMUM ACCESS (MAS)POSTERIOR LUMBAR INTERBODY FUSION (PLIF) 1 LEVEL N/A 09/25/2014   Procedure: Lumbar four-five MAXIMUM ACCESS (MAS) POSTERIOR LUMBAR INTERBODY FUSION (PLIF) 1 LEVEL;  Surgeon: Maeola Harman, MD;  Location: [facility];  Service: Neurosurgery;  Laterality: N/A;   NASAL SEPTOPLASTY W/ TURBINOPLASTY N/A 12/07/2017   Procedure: NASAL SEPTOPLASTY WITH BILATERAL TURBINATE REDUCTION;  Surgeon: Christia Reading, MD;  Location: [facility];  Service: ENT;  Laterality: N/A;   ORIF ANKLE FRACTURE Right    TONSILLECTOMY  06/2018   UPPER GI ENDOSCOPY     Social History   Occupational History   Not on file  Tobacco Use   Smoking status: Never   Smokeless tobacco: Never  Vaping Use   Vaping Use: Never used  Substance and Sexual Activity   Alcohol use: Yes    Comment: once in awhile   Drug use: Never   Sexual activity: Yes    Birth control/protection: Surgical    Comment: hyst     Valeria Batman, MD   Note - This record has been created using Animal nutritionist.  Chart creation errors have been sought, but may not always  have been located. Such creation errors do not reflect on  the standard of medical care.

## 2022-09-27 ENCOUNTER — Other Ambulatory Visit (HOSPITAL_COMMUNITY): Payer: Self-pay

## 2022-10-13 ENCOUNTER — Emergency Department (HOSPITAL_COMMUNITY): Payer: 59

## 2022-10-13 ENCOUNTER — Encounter (HOSPITAL_COMMUNITY): Payer: Self-pay | Admitting: *Deleted

## 2022-10-13 ENCOUNTER — Other Ambulatory Visit: Payer: Self-pay

## 2022-10-13 DIAGNOSIS — R079 Chest pain, unspecified: Secondary | ICD-10-CM | POA: Insufficient documentation

## 2022-10-13 DIAGNOSIS — R42 Dizziness and giddiness: Secondary | ICD-10-CM | POA: Diagnosis not present

## 2022-10-13 LAB — CBC
HCT: 38.4 % (ref 36.0–46.0)
Hemoglobin: 13.4 g/dL (ref 12.0–15.0)
MCH: 33.1 pg (ref 26.0–34.0)
MCHC: 34.9 g/dL (ref 30.0–36.0)
MCV: 94.8 fL (ref 80.0–100.0)
Platelets: 209 10*3/uL (ref 150–400)
RBC: 4.05 MIL/uL (ref 3.87–5.11)
RDW: 12.3 % (ref 11.5–15.5)
WBC: 7 10*3/uL (ref 4.0–10.5)
nRBC: 0 % (ref 0.0–0.2)

## 2022-10-13 LAB — BASIC METABOLIC PANEL
Anion gap: 6 (ref 5–15)
BUN: 7 mg/dL (ref 6–20)
CO2: 26 mmol/L (ref 22–32)
Calcium: 8.9 mg/dL (ref 8.9–10.3)
Chloride: 107 mmol/L (ref 98–111)
Creatinine, Ser: 1.08 mg/dL — ABNORMAL HIGH (ref 0.44–1.00)
GFR, Estimated: >60 mL/min (ref >60)
Glucose, Bld: 90 mg/dL (ref 70–99)
Potassium: 3.4 mmol/L — ABNORMAL LOW (ref 3.5–5.1)
Sodium: 139 mmol/L (ref 135–145)

## 2022-10-13 LAB — TROPONIN I (HIGH SENSITIVITY)
Troponin I (High Sensitivity): <2 ng/L (ref <18)
Troponin I (High Sensitivity): <2 ng/L (ref <18)

## 2022-10-13 NOTE — ED Triage Notes (Signed)
Pt with left cp started today, sharp.  + lightheadedness, + sob, denies any N/V.  Hx of same

## 2022-10-14 ENCOUNTER — Emergency Department (HOSPITAL_COMMUNITY)
Admission: EM | Admit: 2022-10-14 | Discharge: 2022-10-14 | Disposition: A | Discharge status: Home / Self Care | Payer: 59 | Attending: Emergency Medicine | Admitting: Emergency Medicine

## 2022-10-14 DIAGNOSIS — R079 Chest pain, unspecified: Secondary | ICD-10-CM

## 2022-10-14 NOTE — ED Provider Notes (Signed)
[facility] EMERGENCY DEPARTMENT Provider Note   CSN: [id number] Arrival date & time: 10/13/22  1901     History  Chief Complaint  Patient presents with   Chest Pain    Hannah Lynch is a 51 y.o. female.  This is a 51 year old female who presents the ER today with recurrent chest pain.  Associated with some dizziness.  Patient states she has had this multiple times in the last 6 months.  She seen her cardiologist and primary doctor.  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  I discussed with her that she had a cath and a stress test within the last couple years both of which were unremarkable and that this probably was not really necessary.  Looking at the records she has been evaluated many times since at least 2019 for the same symptoms.  Tonight it happened while she was making her plate at home.  Resolved prior to my evaluation.  She has been stable for 8 hours prior to my evaluation.  No fevers or cough.   Chest Pain      Home Medications Prior to Admission medications   Medication Sig Start Date End Date Taking? Authorizing Provider  cyclobenzaprine (FLEXERIL) 10 MG tablet TAKE 1 TABLET BY MOUTH EVERY 8 HOURS AS NEEDED FOR MUSCLE SPASM 06/02/22   Particia Nearing, PA-C  cyclobenzaprine (FLEXERIL) 10 MG tablet Take one tablet (10 mg dose) by mouth 3 (three) times a day as needed for Muscle spasms for up to 10 days. 08/14/22     hyoscyamine (ANASPAZ) 0.125 MG TBDP disintergrating tablet Allow 1 tablet to dissolve on the tongue every 6 hours as needed 01/27/22     ibuprofen (ADVIL) 800 MG tablet Take 1 tablet (800 mg total) by mouth every 8 (eight) hours as needed. 07/17/22   Leath-Warren, Sadie Haber, NP  levothyroxine (SYNTHROID) 25 MCG tablet Take 1 tablet (25 mcg total) by mouth daily. 09/02/22     meloxicam (MOBIC) 15 MG tablet Take 1 tablet (15 mg total) by mouth  daily. 08/11/22     methocarbamol (ROBAXIN) 500 MG tablet Take 1 tablet (500 mg total) by mouth 2 (two) times daily. 07/17/22   Leath-Warren, Sadie Haber, NP  metoprolol succinate (TOPROL-XL) 25 MG 24 hr tablet Take one tablet (25 mg dose) by mouth daily. 02/06/22     montelukast (SINGULAIR) 10 MG tablet Take one tablet (10 mg dose) by mouth daily. 02/28/22     Multiple Vitamin (MULTIVITAMIN) tablet Take 1 tablet by mouth daily.    [provider]  ondansetron (ZOFRAN-ODT) 4 MG disintegrating tablet Take 1 tablet (4 mg total) by mouth every 8 (eight) hours as needed for nausea or vomiting. 08/28/22   Jeannie Fend, PA-C  predniSONE (DELTASONE) 50 MG tablet Take 1 tablet (50 mg total) by mouth daily with breakfast. 06/08/22   Wallis Bamberg, PA-C  rifaximin (XIFAXAN) 550 MG TABS tablet Take 1 tablet by mouth three times a day for 14 days 04/18/22     tiZANidine (ZANAFLEX) 4 MG tablet Take 1 tablet (4 mg total) by mouth at bedtime. 06/08/22   Wallis Bamberg, PA-C  tiZANidine (ZANAFLEX) 4 MG tablet Take 1 tablet (4 mg total) by mouth 3 (three) times daily. 08/11/22     topiramate (TOPAMAX) 25 MG tablet Take 1 tablet by mouth daily for 1 week, then 1 tablet twice daily for  1 week, then 1 tablet in the morning and 2 tablets in evening for 1 week, then 2 tablets in the morning and 2 tablets in the evening thereafter. 09/02/22     traZODone (DESYREL) 150 MG tablet Take 1 tablet (150 mg total) by mouth at bedtime. 09/02/22     pantoprazole (PROTONIX) 40 MG tablet Take one tablet (40 mg dose) by mouth daily. 02/06/22 07/23/22        Allergies    Patient has no known allergies.    Review of Systems   Review of Systems  Cardiovascular:  Positive for chest pain.    Physical Exam Updated Vital Signs BP 99/66   Pulse 72   Temp 98.3 F (36.8 C) (Oral)   Resp 19   Ht 5\' 4"  (1.626 m)   Wt 72.6 kg   SpO2 99%   BMI 27.46 kg/m  Physical Exam Vitals and nursing note reviewed.  Constitutional:       Appearance: She is well-developed.  HENT:     Head: Normocephalic and atraumatic.  Cardiovascular:     Rate and Rhythm: Normal rate and regular rhythm.     Heart sounds: No murmur heard. Pulmonary:     Effort: No respiratory distress.     Breath sounds: No stridor. No decreased breath sounds.  Abdominal:     General: There is no distension.  Musculoskeletal:        General: Normal range of motion.     Cervical back: Normal range of motion.     Right lower leg: No edema.     Left lower leg: No edema.  Skin:    General: Skin is warm and dry.  Neurological:     Mental Status: She is alert.     ED Results / Procedures / Treatments   Labs (all labs ordered are listed, but only abnormal results are displayed) Labs Reviewed  BASIC METABOLIC PANEL - Abnormal; Notable for the following components:      Result Value   Potassium 3.4 (*)    Creatinine, Ser 1.08 (*)    All other components within normal limits  CBC  TROPONIN I (HIGH SENSITIVITY)  TROPONIN I (HIGH SENSITIVITY)    EKG EKG Interpretation  Date/Time:  Monday October 13 2022 19:14:54 EST Ventricular Rate:  83 PR Interval:  128 QRS Duration: 86 QT Interval:  356 QTC Calculation: 418 R Axis:   9 Text Interpretation: Normal sinus rhythm Low voltage QRS Possible Inferior infarct , age undetermined Abnormal ECG When compared with ECG of 27-May-2019 09:25, Borderline criteria for Inferior infarct are now Present Confirmed by 29-May-2019 [phone]) on 10/13/2022 11:46:32 PM  Radiology DG Chest 2 View  Result Date: 10/13/2022 CLINICAL DATA:  Chest pain EXAM: CHEST - 2 VIEW COMPARISON:  05/27/2019 FINDINGS: The heart size and mediastinal contours are within normal limits. Both lungs are clear. The visualized skeletal structures are unremarkable. IMPRESSION: Negative. Electronically Signed   By: 05/29/2019 M.D.   On: 10/13/2022 20:04    Procedures Procedures    Medications Ordered in ED Medications - No data to  display  ED Course/ Medical Decision Making/ A&P                           Medical Decision Making Amount and/or Complexity of Data Reviewed Labs: ordered. Radiology: ordered.   With any acute exacerbation of chronic symptoms.  She already has a cardiologist.  She will follow-up with  cardiology and PCP as needed.  CXR done and showed no obvious pneumonia (independently viewed and interpreted by myself and radiology read reviewed).    Final Clinical Impression(s) / ED Diagnoses Final diagnoses:  Nonspecific chest pain    Rx / DC Orders ED Discharge Orders     None         Orrie Schubert, Barbara Cower, MD 10/14/22 2334

## 2022-10-14 NOTE — ED Notes (Signed)
Went over dc papers. All questions answered. Ambulatory to lobby .  

## 2022-10-19 ENCOUNTER — Ambulatory Visit
Admission: RE | Admit: 2022-10-19 | Discharge: 2022-10-19 | Disposition: A | Discharge status: Home / Self Care | Payer: 59 | Source: Ambulatory Visit | Attending: Orthopaedic Surgery | Admitting: Orthopaedic Surgery

## 2022-10-19 DIAGNOSIS — M25511 Pain in right shoulder: Secondary | ICD-10-CM

## 2022-10-21 ENCOUNTER — Encounter: Payer: Self-pay | Admitting: Orthopaedic Surgery

## 2022-10-21 ENCOUNTER — Ambulatory Visit (INDEPENDENT_AMBULATORY_CARE_PROVIDER_SITE_OTHER): Payer: 59 | Admitting: Orthopaedic Surgery

## 2022-10-21 DIAGNOSIS — M7541 Impingement syndrome of right shoulder: Secondary | ICD-10-CM | POA: Diagnosis not present

## 2022-10-21 DIAGNOSIS — M25511 Pain in right shoulder: Secondary | ICD-10-CM

## 2022-10-21 NOTE — Progress Notes (Signed)
Office Visit Note   Patient: Hannah Lynch           Date of Birth: Aug 02, 1971           MRN: [id number] Visit Date: 10/21/2022              Requested by: Eartha Inch, MD [address] [place],  [place] [address] PCP: Eartha Inch, MD   Assessment & Plan: Visit Diagnoses:  1. Acute pain of right shoulder   2. Impingement syndrome of right shoulder     Plan: [name] comes in today for follow-up on her right shoulder pain.  This initially started 2-1/2 months ago and is was of insidious onset.  She was given a subacromial injection into the right shoulder.  This did not help her and she felt if anything made it worse.  Because of the length of time of her pain especially with overhead activities, an MRI was ordered.  She is here to review it today.  The MRI showed the rotator cuff to be intact without tearing although there was considerable atrophy of the teres minor.  There was no evidence of a space occupying lesion in the quadrilateral space.  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  Reviewed this that she does have some early arthritis.  She has not yet tried physical therapy and we will order this close to her home in [place].  Will also arrange a follow-up with Dr. August Saucer with the given MRI may benefit from an ultrasound-guided injection into the glenohumeral joint.  Follow-Up Instructions: Return with Dr. August Saucer.   Orders:  Orders Placed This Encounter  Procedures   Ambulatory referral to Physical Therapy   No orders of the defined types were placed in this encounter.     Procedures: No procedures performed   Clinical Data: No additional findings.   Subjective: Chief Complaint  Patient presents with   Right Shoulder - Pain    HPI this is a pleasant 51 year old woman with a 2-1/66-month history of insidious onset of right shoulder pain.  She did not improve with a  subacromial injection.  An MRI was ordered and she is here to review this today  Review of Systems  All other systems reviewed and are negative.    Objective: Vital Signs: There were no vitals taken for this visit.  Physical Exam Constitutional:      Appearance: Normal appearance.  Pulmonary:     Effort: Pulmonary effort is normal.  Skin:    General: Skin is warm and dry.  Neurological:     Mental Status: She is alert.     Ortho Exam Examination of her right shoulder.  She has full active forward elevation though is quite painful for her.  She is neurovascularly intact.  She actually has fairly good motion with external rotation.  She does have some reproducible pain over the glenohumeral joint and over the acromioclavicular joint consistent with her arthritic changes.  Strength is intact.  No crepitation Specialty Comments:  No specialty comments available.  Imaging: No results found.   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 08/30/2020   Pain in left knee 10/12/2019   Obesity (BMI  30-39.9) 09/27/2019   GERD (gastroesophageal reflux disease) 09/27/2019   Essential hypertension 09/27/2019   Fatty liver 09/27/2019   Metabolic syndrome 09/27/2019   Pain in right ankle and joints of right foot 04/13/2019   Chronic cholecystitis    Primary insomnia 06/25/2017   Papanicolaou smear for cervical cancer screening 06/25/2017   Bilateral hearing loss 06/25/2017   Acute serous otitis media 06/25/2017   Lumbar stenosis with neurogenic claudication 09/25/2014   Past Medical History:  Diagnosis Date   Anxiety    Degenerative disc disease, lumbar    Degenerative disc disease, lumbar 2012   lumbar and cervical region   Depression    Endometriosis    Family history  of adverse reaction to anesthesia    pt's mother has hx. of post-op N/V   Headache    Hypertension    Hypothyroidism    IBS (irritable bowel syndrome)    Nasal septal deviation 11/2017   Nasal turbinate hypertrophy 11/2017   PONV (postoperative nausea and vomiting)    Snoring 11/2017    Family History  Problem Relation Age of Onset   Hypertension Mother    Diabetes Mother    Heart failure Father    Hypertension Father    Diabetes Father    Heart failure Other    Breast cancer Paternal Grandmother     Past Surgical History:  Procedure Laterality Date   ABDOMINAL HYSTERECTOMY  2005   partial - TAH, no cervix, has both ovaries; done for endometriosis   ANTERIOR CERVICAL DECOMP/DISCECTOMY FUSION  03/25/2007   C4-5   CERVICAL FUSION  2013   CHOLECYSTECTOMY N/A 03/26/2018   Procedure: LAPAROSCOPIC CHOLECYSTECTOMY;  Surgeon: Franky Macho, MD;  Location: [facility];  Service: General;  Laterality: N/A;   KNEE ARTHROSCOPY Right    LAPAROSCOPIC GASTRIC SLEEVE RESECTION N/A 09/27/2019   Procedure: LAPAROSCOPIC GASTRIC SLEEVE RESECTION, HIATAL HERNIA REPARI ;Upper Endo, Eras Pathway;  Surgeon: Gaynelle Adu, MD;  Location: [facility];  Service: General;  Laterality: N/A;   LUMBAR LAMINECTOMY/DECOMPRESSION MICRODISCECTOMY  11/21/2004   L4-5   MAXIMUM ACCESS (MAS)POSTERIOR LUMBAR INTERBODY FUSION (PLIF) 1 LEVEL N/A 09/25/2014   Procedure: Lumbar four-five MAXIMUM ACCESS (MAS) POSTERIOR LUMBAR INTERBODY FUSION (PLIF) 1 LEVEL;  Surgeon: Maeola Harman, MD;  Location: [facility];  Service: Neurosurgery;  Laterality: N/A;   NASAL SEPTOPLASTY W/ TURBINOPLASTY N/A 12/07/2017   Procedure: NASAL SEPTOPLASTY WITH BILATERAL TURBINATE REDUCTION;  Surgeon: Christia Reading, MD;  Location: [facility];  Service: ENT;  Laterality: N/A;   ORIF ANKLE FRACTURE Right    TONSILLECTOMY  06/2018   UPPER GI ENDOSCOPY     Social History   Occupational History   Not on file  Tobacco Use   Smoking  status: Never   Smokeless tobacco: Never  Vaping Use   Vaping Use: Never used  Substance and Sexual Activity   Alcohol use: Yes    Comment: once in awhile   Drug use: Never   Sexual activity: Yes    Birth control/protection: Surgical    Comment: hyst

## 2022-10-22 ENCOUNTER — Other Ambulatory Visit: Payer: Self-pay

## 2022-10-22 DIAGNOSIS — M25511 Pain in right shoulder: Secondary | ICD-10-CM

## 2022-10-28 ENCOUNTER — Other Ambulatory Visit (HOSPITAL_COMMUNITY): Payer: Self-pay

## 2022-10-28 MED ORDER — TOPIRAMATE 25 MG PO TABS
ORAL_TABLET | ORAL | 0 refills | Status: DC
  Filled 2022-10-28: qty 120, 30d supply, fill #0

## 2022-11-06 ENCOUNTER — Ambulatory Visit (INDEPENDENT_AMBULATORY_CARE_PROVIDER_SITE_OTHER): Payer: 59 | Admitting: Surgical

## 2022-11-06 ENCOUNTER — Encounter: Payer: Self-pay | Admitting: Surgical

## 2022-11-06 ENCOUNTER — Ambulatory Visit: Payer: Self-pay

## 2022-11-06 DIAGNOSIS — M19011 Primary osteoarthritis, right shoulder: Secondary | ICD-10-CM | POA: Diagnosis not present

## 2022-11-06 DIAGNOSIS — M25511 Pain in right shoulder: Secondary | ICD-10-CM

## 2022-11-06 MED ORDER — METHYLPREDNISOLONE ACETATE 40 MG/ML IJ SUSP
13.3300 mg | INTRAMUSCULAR | Status: AC | PRN
  Administered 2022-11-06: 13.33 mg via INTRA_ARTICULAR

## 2022-11-06 MED ORDER — BUPIVACAINE HCL 0.25 % IJ SOLN
0.6600 mL | INTRAMUSCULAR | Status: AC | PRN
  Administered 2022-11-06: .66 mL via INTRA_ARTICULAR

## 2022-11-06 MED ORDER — LIDOCAINE HCL 1 % IJ SOLN
3.0000 mL | INTRAMUSCULAR | Status: AC | PRN
  Administered 2022-11-06: 3 mL

## 2022-11-06 NOTE — Progress Notes (Signed)
Office Visit Note   Patient: Hannah Lynch           Date of Birth: 01/07/1971           MRN: [id number] Visit Date: 11/06/2022 Requested by: Chesley Noon, MD [place],  [place] [id number] PCP: Chesley Noon, MD  Subjective: Chief Complaint  Patient presents with   Right Shoulder - Pain    HPI: Corry Fulwiler Orf is a 51 y.o. female who presents to the office reporting right shoulder pain.  Patient states that she has had pain for several months without any history of injury.  She states she just woke up and had some shoulder pain 1 day.  Mostly localizes pain to the anterior and superior aspect of the shoulder without any significant radiation.  Does have a little bit of numbness down in her hands but nothing throughout the majority of the arm.  Not having any increased neck pain.  Does not have any posterior shoulder pain.  No numbness or tingling in the axillary nerve distribution.  No history of prior surgery.  She had prior subacromial injection into the right shoulder that did not give her any relief.  She had MRI ordered that was reviewed with Dr. Durward Fortes demonstrating atrophy of the teres minor without any compressive pathology in the quadrilateral space.  Also demonstrated mild to moderate glenohumeral arthritis with moderate AC joint arthritis.              ROS: All systems reviewed are negative as they relate to the chief complaint within the history of present illness.  Patient denies fevers or chills.  Assessment & Plan: Visit Diagnoses:  1. Osteoarthritis of right AC (acromioclavicular) joint   2. Acute pain of right shoulder     Plan: Patient is a 51 year old female who presents for evaluation of right shoulder pain.  Majority of her pain localizes to the [facility] joint on exam and by her history today.  Has trouble laying on that side and has increased pain with crossarm adduction.  She has AC joint arthritis that is noted on MRI scan.  Also has some mild to  moderate degenerative changes at the glenohumeral joint but really does not have any pain with passive motion of the shoulder joint and abduction or forward flexion and has no limitation of range of motion compared with contralateral side so think this is less at play than the [facility] joint arthritis is.  She also has some teres minor atrophy but she really does not have any weakness with external rotation both by her side and when she is AB ducted.  There is no atrophy of the deltoid musculature either and deltoid fires well on exam today.  Plan is ultrasound guided AC joint injection into the right shoulder today.  She tolerated this procedure well and after a couple minutes, she had significant improvement of pain with palpation over the [facility] joint, resisted abduction, crossarm adduction passively.  She will see how this does for her and send [place] a message on MyChart in 2 weeks saying whether or not she had longstanding relief.  If she has several weeks to months of relief, could repeat this injection in the future.  If it only lasts for a week or so, may need to consider distal clavicle excision arthroscopically.  Follow-up as needed in the future.  Follow-Up Instructions: No follow-ups on file.   Orders:  Orders Placed This Encounter  Procedures  US Guided Needle Placement - No Linked Charges   No orders of the defined types were placed in this encounter.     Procedures: Medium Joint Inj: R acromioclavicular on 11/06/2022 12:52 PM Indications: diagnostic evaluation and pain Details: 25 G 1.5 in needle, ultrasound-guided superior approach Medications: 3 mL lidocaine 1 %; 0.66 mL bupivacaine 0.25 %; 13.33 mg methylPREDNISolone acetate 40 MG/ML Outcome: tolerated well, no immediate complications Procedure, treatment alternatives, risks and benefits explained, specific risks discussed. Consent was given by the patient. Immediately prior to procedure a time out was called to verify the correct patient,  procedure, equipment, support staff and site/side marked as required. Patient was prepped and draped in the usual sterile fashion.       Clinical Data: No additional findings.  Objective: Vital Signs: There were no vitals taken for this visit.  Physical Exam:  Constitutional: Patient appears well-developed HEENT:  Head: Normocephalic Eyes:EOM are normal Neck: Normal range of motion Cardiovascular: Normal rate Pulmonary/chest: Effort normal Neurologic: Patient is alert Skin: Skin is warm Psychiatric: Patient has normal mood and affect  Ortho Exam: Ortho exam demonstrates right shoulder with 60 degrees X rotation, 110 degrees abduction, 180 degrees forward flexion.  She has excellent rotator cuff strength of supra, infra, subscap.  Negative Hornblower sign.  Negative external rotation lag sign.  She has tenderness over the bicipital groove as well as moderate tenderness over the [facility] joint.  Axillary nerve is intact with deltoid firing.  There is no discernible atrophy of the deltoid muscle compared with contralateral side.  There is no paresthesias or any change in sensation over the deltoid region compared with contralateral side.  She has excellent strength with holding her arm in an abducted and externally rotated position.  5/5 motor strength of EPL, FPL, finger abduction, grip strength testing, pronation/supination, bicep, tricep, deltoid.  Does have a little bit of reproduction of pain with resisted supination.  Also does have some reproduction of pain with resisted abduction that she localizes to the [facility] joint.  Increased pain with crossarm adduction again localizing to the [facility] joint.  Specialty Comments:  No specialty comments available.  Imaging: No results found.   PMFS History: Patient Active Problem List   Diagnosis Date Noted   Pain in right wrist 08/20/2022   Impingement syndrome of right shoulder 08/20/2022   Trochanteric bursitis, left hip 08/30/2020   Osteoarthritis  of joint of toe of right foot great toe 08/30/2020   Pain in left knee 10/12/2019   Obesity (BMI 30-39.9) 09/27/2019   GERD (gastroesophageal reflux disease) 09/27/2019   Essential hypertension 09/27/2019   Fatty liver 09/27/2019   Metabolic syndrome 09/27/2019   Pain in right ankle and joints of right foot 04/13/2019   Chronic cholecystitis    Primary insomnia 06/25/2017   Papanicolaou smear for cervical cancer screening 06/25/2017   Bilateral hearing loss 06/25/2017   Acute serous otitis media 06/25/2017   Lumbar stenosis with neurogenic claudication 09/25/2014   Past Medical History:  Diagnosis Date   Anxiety    Degenerative disc disease, lumbar    Degenerative disc disease, lumbar 2012   lumbar and cervical region   Depression    Endometriosis    Family history of adverse reaction to anesthesia    pt's mother has hx. of post-op N/V   Headache    Hypertension    Hypothyroidism    IBS (irritable bowel syndrome)    Nasal septal deviation 11/2017   Nasal turbinate hypertrophy 11/2017  PONV (postoperative nausea and vomiting)    Snoring 11/2017    Family History  Problem Relation Age of Onset   Hypertension Mother    Diabetes Mother    Heart failure Father    Hypertension Father    Diabetes Father    Heart failure Other    Breast cancer Paternal Grandmother     Past Surgical History:  Procedure Laterality Date   ABDOMINAL HYSTERECTOMY  2005   partial - TAH, no cervix, has both ovaries; done for endometriosis   ANTERIOR CERVICAL DECOMP/DISCECTOMY FUSION  03/25/2007   C4-5   CERVICAL FUSION  2013   CHOLECYSTECTOMY N/A 03/26/2018   Procedure: LAPAROSCOPIC CHOLECYSTECTOMY;  Surgeon: Aviva Signs, MD;  Location: [facility];  Service: General;  Laterality: N/A;   KNEE ARTHROSCOPY Right    LAPAROSCOPIC GASTRIC SLEEVE RESECTION N/A 09/27/2019   Procedure: LAPAROSCOPIC GASTRIC SLEEVE RESECTION, HIATAL HERNIA REPARI ;Upper Endo, Eras Pathway;  Surgeon: Greer Pickerel, MD;   Location: [facility];  Service: General;  Laterality: N/A;   LUMBAR LAMINECTOMY/DECOMPRESSION MICRODISCECTOMY  11/21/2004   L4-5   MAXIMUM ACCESS (MAS)POSTERIOR LUMBAR INTERBODY FUSION (PLIF) 1 LEVEL N/A 09/25/2014   Procedure: Lumbar four-five MAXIMUM ACCESS (MAS) POSTERIOR LUMBAR INTERBODY FUSION (PLIF) 1 LEVEL;  Surgeon: Erline Levine, MD;  Location: [place] [facility];  Service: Neurosurgery;  Laterality: N/A;   NASAL SEPTOPLASTY W/ TURBINOPLASTY N/A 12/07/2017   Procedure: NASAL SEPTOPLASTY WITH BILATERAL TURBINATE REDUCTION;  Surgeon: Melida Quitter, MD;  Location: [place];  Service: ENT;  Laterality: N/A;   ORIF ANKLE FRACTURE Right    TONSILLECTOMY  06/2018   UPPER GI ENDOSCOPY     Social History   Occupational History   Not on file  Tobacco Use   Smoking status: Never   Smokeless tobacco: Never  Vaping Use   Vaping Use: Never used  Substance and Sexual Activity   Alcohol use: Yes    Comment: once in awhile   Drug use: Never   Sexual activity: Yes    Birth control/protection: Surgical    Comment: hyst

## 2022-11-10 ENCOUNTER — Other Ambulatory Visit: Payer: Self-pay

## 2022-11-10 ENCOUNTER — Ambulatory Visit
Admission: EM | Admit: 2022-11-10 | Discharge: 2022-11-10 | Disposition: A | Discharge status: Home / Self Care | Payer: No Typology Code available for payment source | Attending: Urgent Care | Admitting: Urgent Care

## 2022-11-10 ENCOUNTER — Encounter: Payer: Self-pay | Admitting: Emergency Medicine

## 2022-11-10 DIAGNOSIS — R6889 Other general symptoms and signs: Secondary | ICD-10-CM

## 2022-11-10 MED ORDER — OSELTAMIVIR PHOSPHATE 75 MG PO CAPS: 75.0000 mg | ORAL_CAPSULE | Freq: Two times a day (BID) | ORAL | 0 refills | Status: DC

## 2022-11-10 NOTE — Discharge Instructions (Signed)
Follow up here or with your primary care provider if your symptoms are worsening or not improving.     

## 2022-11-10 NOTE — ED Provider Notes (Signed)
[facility]    CSN: [id number] Arrival date & time: 11/10/22  1750      History   Chief Complaint Chief Complaint  Patient presents with   Generalized Body Aches    HPI Hannah Lynch is a 52 y.o. female.   HPI  Presents to urgent care with report of symptoms starting yesterday.  Symptoms include chills, lightheadedness and dizziness, headache, body aches, "weird" feeling in ears.   Past Medical History:  Diagnosis Date   Anxiety    Degenerative disc disease, lumbar    Degenerative disc disease, lumbar 2012   lumbar and cervical region   Depression    Endometriosis    Family history of adverse reaction to anesthesia    pt's mother has hx. of post-op N/V   Headache    Hypertension    Hypothyroidism    IBS (irritable bowel syndrome)    Nasal septal deviation 11/2017   Nasal turbinate hypertrophy 11/2017   PONV (postoperative nausea and vomiting)    Snoring 11/2017    Patient Active Problem List   Diagnosis Date Noted   Pain in right wrist 08/20/2022   Impingement syndrome of right shoulder 08/20/2022   Trochanteric bursitis, left hip 08/30/2020   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 08/30/2020   Pain in left knee 10/12/2019   Obesity (BMI 30-39.9) 09/27/2019   GERD (gastroesophageal reflux disease) 09/27/2019   Essential hypertension 09/27/2019   Fatty liver 08/65/7846   Metabolic syndrome 96/29/5284   Pain in right ankle and joints of right foot 04/13/2019   Chronic cholecystitis    Primary insomnia 06/25/2017   Papanicolaou smear for cervical cancer screening 06/25/2017   Bilateral hearing loss 06/25/2017   Acute serous otitis media 06/25/2017   Lumbar stenosis with neurogenic claudication 09/25/2014    Past Surgical History:  Procedure Laterality Date   ABDOMINAL HYSTERECTOMY  2005   partial - TAH, no cervix, has both ovaries; done for endometriosis   ANTERIOR CERVICAL DECOMP/DISCECTOMY FUSION  03/25/2007   C4-5    CERVICAL FUSION  2013   CHOLECYSTECTOMY N/A 03/26/2018   Procedure: LAPAROSCOPIC CHOLECYSTECTOMY;  Surgeon: Aviva Signs, MD;  Location: [facility];  Service: General;  Laterality: N/A;   KNEE ARTHROSCOPY Right    LAPAROSCOPIC GASTRIC SLEEVE RESECTION N/A 09/27/2019   Procedure: LAPAROSCOPIC GASTRIC SLEEVE RESECTION, HIATAL HERNIA REPARI ;Upper Endo, Eras Pathway;  Surgeon: Greer Pickerel, MD;  [facility];  Service: General;  Laterality: N/A;   LUMBAR LAMINECTOMY/DECOMPRESSION MICRODISCECTOMY  11/21/2004   L4-5   MAXIMUM ACCESS (MAS)POSTERIOR LUMBAR INTERBODY FUSION (PLIF) 1 LEVEL N/A 09/25/2014   Procedure: Lumbar four-five MAXIMUM ACCESS (MAS) POSTERIOR LUMBAR INTERBODY FUSION (PLIF) 1 LEVEL;  Surgeon: Erline Levine, MD;  Location: [place] [facility];  Service: Neurosurgery;  Laterality: N/A;   NASAL SEPTOPLASTY W/ TURBINOPLASTY N/A 12/07/2017   Procedure: NASAL SEPTOPLASTY WITH BILATERAL TURBINATE REDUCTION;  Surgeon: Melida Quitter, MD;  Location: [place];  Service: ENT;  Laterality: N/A;   ORIF ANKLE FRACTURE Right    TONSILLECTOMY  06/2018   UPPER GI ENDOSCOPY      OB History     Gravida  2   Para      Term      Preterm      AB      Living         SAB      IAB      Ectopic      Multiple  Live Births               Home Medications    Prior to Admission medications   Medication Sig Start Date End Date Taking? Authorizing Provider  cyclobenzaprine (FLEXERIL) 10 MG tablet TAKE 1 TABLET BY MOUTH EVERY 8 HOURS AS NEEDED FOR MUSCLE SPASM 06/02/22   Volney American, PA-C  cyclobenzaprine (FLEXERIL) 10 MG tablet Take one tablet (10 mg dose) by mouth 3 (three) times a day as needed for Muscle spasms for up to 10 days. 08/14/22     hyoscyamine (ANASPAZ) 0.125 MG TBDP disintergrating tablet Allow 1 tablet to dissolve on the tongue every 6 hours as needed 01/27/22     ibuprofen (ADVIL) 800 MG tablet Take 1 tablet (800 mg total) by mouth every 8  (eight) hours as needed. 07/17/22   Leath-Warren, Alda Lea, NP  levothyroxine (SYNTHROID) 25 MCG tablet Take 1 tablet (25 mcg total) by mouth daily. 09/02/22     meloxicam (MOBIC) 15 MG tablet Take 1 tablet (15 mg total) by mouth daily. 08/11/22     methocarbamol (ROBAXIN) 500 MG tablet Take 1 tablet (500 mg total) by mouth 2 (two) times daily. 07/17/22   Leath-Warren, Alda Lea, NP  metoprolol succinate (TOPROL-XL) 25 MG 24 hr tablet Take one tablet (25 mg dose) by mouth daily. 02/06/22     montelukast (SINGULAIR) 10 MG tablet Take one tablet (10 mg dose) by mouth daily. 02/28/22     Multiple Vitamin (MULTIVITAMIN) tablet Take 1 tablet by mouth daily.    [provider]  ondansetron (ZOFRAN-ODT) 4 MG disintegrating tablet Take 1 tablet (4 mg total) by mouth every 8 (eight) hours as needed for nausea or vomiting. 08/28/22   Tacy Learn, PA-C  predniSONE (DELTASONE) 50 MG tablet Take 1 tablet (50 mg total) by mouth daily with breakfast. 06/08/22   Jaynee Eagles, PA-C  rifaximin (XIFAXAN) 550 MG TABS tablet Take 1 tablet by mouth three times a day for 14 days 04/18/22     tiZANidine (ZANAFLEX) 4 MG tablet Take 1 tablet (4 mg total) by mouth at bedtime. 06/08/22   Jaynee Eagles, PA-C  tiZANidine (ZANAFLEX) 4 MG tablet Take 1 tablet (4 mg total) by mouth 3 (three) times daily. 08/11/22     topiramate (TOPAMAX) 25 MG tablet Take 1 tablet by mouth daily for 1 week, then 1 tablet twice daily for 1 week, then 1 tablet in the morning and 2 tablets in evening for 1 week, then 2 tablets in the morning and 2 tablets in the evening thereafter. 10/28/22     traZODone (DESYREL) 150 MG tablet Take 1 tablet (150 mg total) by mouth at bedtime. 09/02/22     pantoprazole (PROTONIX) 40 MG tablet Take one tablet (40 mg dose) by mouth daily. 02/06/22 07/23/22      Family History Family History  Problem Relation Age of Onset   Hypertension Mother    Diabetes Mother    Heart failure Father    Hypertension Father     Diabetes Father    Heart failure Other    Breast cancer Paternal Grandmother     Social History Social History   Tobacco Use   Smoking status: Never   Smokeless tobacco: Never  Vaping Use   Vaping Use: Never used  Substance Use Topics   Alcohol use: Yes    Comment: once in awhile   Drug use: Never     Allergies   Patient has no known allergies.  Review of Systems Review of Systems   Physical Exam Triage Vital Signs ED Triage Vitals  Enc Vitals Group     BP 11/10/22 1858 106/74     Pulse Rate 11/10/22 1858 77     Resp 11/10/22 1858 16     Temp 11/10/22 1858 99.3 F (37.4 C)     Temp Source 11/10/22 1858 Oral     SpO2 11/10/22 1858 97 %     Weight --      Height --      Head Circumference --      Peak Flow --      Pain Score 11/10/22 1856 5     Pain Loc --      Pain Edu? --      Excl. in GC? --    No data found.  Updated Vital Signs BP 106/74 (BP Location: Right Arm)   Pulse 77   Temp 99.3 F (37.4 C) (Oral)   Resp 16   SpO2 97%   Visual Acuity Right Eye Distance:   Left Eye Distance:   Bilateral Distance:    Right Eye Near:   Left Eye Near:    Bilateral Near:     Physical Exam Vitals reviewed.  Constitutional:      Appearance: Normal appearance.  Cardiovascular:     Rate and Rhythm: Normal rate and regular rhythm.     Pulses: Normal pulses.     Heart sounds: Normal heart sounds.  Pulmonary:     Effort: Pulmonary effort is normal.     Breath sounds: Normal breath sounds.  Skin:    General: Skin is warm and dry.  Neurological:     General: No focal deficit present.     Mental Status: She is alert and oriented to person, place, and time.  Psychiatric:        Mood and Affect: Mood normal.        Behavior: Behavior normal.      UC Treatments / Results  Labs (all labs ordered are listed, but only abnormal results are displayed) Labs Reviewed - No data to display  EKG   Radiology No results found.  Procedures Procedures  (including critical care time)  Medications Ordered in UC Medications - No data to display  Initial Impression / Assessment and Plan / UC Course  I have reviewed the triage vital signs and the nursing notes.  Pertinent labs & imaging results that were available during my care of the patient were reviewed by me and considered in my medical decision making (see chart for details).   Patient is afebrile here without recent antipyretics. Satting well on room air. Overall is well appearing, well hydrated, without respiratory distress. Pulmonary exam is unremarkable.  Lungs CTAB without wheezing, rhonchi, rales.  Pharyngeal erythema or peritonsillar exudates.  Patient's symptoms are consistent with acute viral process including influenza.  Given symptoms are within 1 day, will treat presumptively for influenza A with Tamiflu.  Recommending use of OTC medication for symptom control.  Final Clinical Impressions(s) / UC Diagnoses   Final diagnoses:  None   Discharge Instructions   None    ED Prescriptions   None    PDMP not reviewed this encounter.   Charma Igo, [place] 11/10/22 Windell Moment

## 2022-11-10 NOTE — ED Triage Notes (Signed)
Yesterday evening had chills, lightheaded, and dizziness, headache, body aches, ears feeling weird.  Patient has taken cold and flu symptoms

## 2022-11-11 ENCOUNTER — Encounter: Payer: Self-pay | Admitting: Orthopaedic Surgery

## 2022-11-22 ENCOUNTER — Other Ambulatory Visit (HOSPITAL_COMMUNITY): Payer: Self-pay

## 2022-11-24 ENCOUNTER — Other Ambulatory Visit: Payer: Self-pay

## 2022-11-24 ENCOUNTER — Encounter (HOSPITAL_COMMUNITY): Payer: Self-pay

## 2022-11-24 ENCOUNTER — Other Ambulatory Visit (HOSPITAL_COMMUNITY): Payer: Self-pay

## 2022-11-24 MED ORDER — TOPIRAMATE 25 MG PO TABS
ORAL_TABLET | ORAL | 0 refills | Status: DC
  Filled 2022-11-24: qty 120, 30d supply, fill #0

## 2022-11-24 MED ORDER — MONTELUKAST SODIUM 10 MG PO TABS
10.0000 mg | ORAL_TABLET | Freq: Every day | ORAL | 2 refills | Status: DC
  Filled 2023-01-03: qty 90, 90d supply, fill #0
  Filled 2023-03-31: qty 90, 90d supply, fill #1
  Filled 2023-07-01: qty 90, 90d supply, fill #2

## 2022-11-26 DIAGNOSIS — R002 Palpitations: Secondary | ICD-10-CM | POA: Insufficient documentation

## 2022-11-27 ENCOUNTER — Other Ambulatory Visit (HOSPITAL_COMMUNITY): Payer: Self-pay

## 2022-12-09 NOTE — Telephone Encounter (Signed)
Okay for appointment, preferably with Dr Marlou Sa to discuss potential for repeat injection vs surgery vs different injection

## 2022-12-11 ENCOUNTER — Ambulatory Visit (HOSPITAL_COMMUNITY): Payer: 59 | Admitting: Occupational Therapy

## 2022-12-24 ENCOUNTER — Ambulatory Visit: Payer: 59 | Admitting: Orthopedic Surgery

## 2022-12-25 ENCOUNTER — Encounter: Payer: Self-pay | Admitting: Orthopedic Surgery

## 2022-12-25 ENCOUNTER — Ambulatory Visit (INDEPENDENT_AMBULATORY_CARE_PROVIDER_SITE_OTHER): Payer: 59 | Admitting: Orthopedic Surgery

## 2022-12-25 ENCOUNTER — Ambulatory Visit: Payer: Self-pay

## 2022-12-25 DIAGNOSIS — M19011 Primary osteoarthritis, right shoulder: Secondary | ICD-10-CM | POA: Diagnosis not present

## 2022-12-25 DIAGNOSIS — M25511 Pain in right shoulder: Secondary | ICD-10-CM

## 2022-12-25 MED ORDER — METHYLPREDNISOLONE ACETATE 40 MG/ML IJ SUSP
13.3300 mg | INTRAMUSCULAR | Status: AC | PRN
  Administered 2022-12-25: 13.33 mg via INTRA_ARTICULAR

## 2022-12-25 MED ORDER — BUPIVACAINE HCL 0.25 % IJ SOLN
0.6600 mL | INTRAMUSCULAR | Status: AC | PRN
  Administered 2022-12-25: .66 mL via INTRA_ARTICULAR

## 2022-12-25 MED ORDER — LIDOCAINE HCL 1 % IJ SOLN
3.0000 mL | INTRAMUSCULAR | Status: AC | PRN
  Administered 2022-12-25: 3 mL

## 2022-12-25 NOTE — Progress Notes (Signed)
Office Visit Note   Patient: Hannah Lynch           Date of Birth: May 17, 1971           MRN: [id number] Visit Date: 12/25/2022 Requested by: Chesley Noon, MD [place],  [place] [id number] PCP: Chesley Noon, MD  Subjective: Chief Complaint  Patient presents with   Right Shoulder - Follow-up    HPI: Hannah Lynch is a 52 y.o. female who presents to the office reporting right shoulder pain.  She had AC joint injection 10/29/2022 and she did get good relief for about 2 weeks from that.  This was 85 to 90% relief.  The pain wakes her from sleep at night.  Describes constant pain.  She is right-hand dominant.  Does not like to take medication because of stomach issues.  Hard for her to sleep on the right-hand side.  MRI scan does show some AC joint arthritis with intact rotator cuff..                ROS: All systems reviewed are negative as they relate to the chief complaint within the history of present illness.  Patient denies fevers or chills.  Assessment & Plan: Visit Diagnoses:  1. Acute pain of right shoulder   2. Osteoarthritis of right AC (acromioclavicular) joint     Plan: Impression is right shoulder symptomatic AC joint arthritis.  Repeat injection performed today.  That would be the final injection for her this year.  If symptoms recur she would have to live with the symptoms or consider arthroscopic distal clavicle excision.  Plan to see her back as needed.  Follow-Up Instructions: No follow-ups on file.   Orders:  Orders Placed This Encounter  Procedures   US Guided Needle Placement - No Linked Charges   No orders of the defined types were placed in this encounter.     Procedures: Medium Joint Inj: R acromioclavicular on 12/25/2022 11:18 PM Indications: diagnostic evaluation and pain Details: 25 G 1.5 in needle, ultrasound-guided superior approach Medications: 3 mL lidocaine 1 %; 0.66 mL bupivacaine 0.25 %; 13.33 mg methylPREDNISolone  acetate 40 MG/ML Outcome: tolerated well, no immediate complications Procedure, treatment alternatives, risks and benefits explained, specific risks discussed. Consent was given by the patient. Immediately prior to procedure a time out was called to verify the correct patient, procedure, equipment, support staff and site/side marked as required. Patient was prepped and draped in the usual sterile fashion.       Clinical Data: No additional findings.  Objective: Vital Signs: There were no vitals taken for this visit.  Physical Exam:  Constitutional: Patient appears well-developed HEENT:  Head: Normocephalic Eyes:EOM are normal Neck: Normal range of motion Cardiovascular: Normal rate Pulmonary/chest: Effort normal Neurologic: Patient is alert Skin: Skin is warm Psychiatric: Patient has normal mood and affect  Ortho Exam: Ortho exam demonstrates good rotator cuff strength and strength subscap muscle testing in both shoulders.  Does have AC joint tenderness and some popping in the [facility] joint the passive range of motion on the right with pain with crossarm adduction.  She has symmetric range of motion of 70/110/175 bilaterally with no apprehension.  O'Brien's testing negative bilaterally.  Speeds testing positive on the right.  Cervical spine range of motion is full.  No paresthesias C5-T1.  Specialty Comments:  No specialty comments available.  Imaging: No results found.   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 08/30/2020   Pain in left knee 10/12/2019   Obesity (BMI 30-39.9) 09/27/2019   GERD (gastroesophageal reflux disease) 09/27/2019   Essential hypertension 09/27/2019   Fatty liver 123456   Metabolic syndrome 123456   Pain in right ankle and joints of  right foot 04/13/2019   Chronic cholecystitis    Primary insomnia 06/25/2017   Papanicolaou smear for cervical cancer screening 06/25/2017   Bilateral hearing loss 06/25/2017   Acute serous otitis media 06/25/2017   Lumbar stenosis with neurogenic claudication 09/25/2014   Past Medical History:  Diagnosis Date   Anxiety    Degenerative disc disease, lumbar    Degenerative disc disease, lumbar 2012   lumbar and cervical region   Depression    Endometriosis    Family history of adverse reaction to anesthesia    pt's mother has hx. of post-op N/V   Headache    Hypertension    Hypothyroidism    IBS (irritable bowel syndrome)    Nasal septal deviation 11/2017   Nasal turbinate hypertrophy 11/2017   PONV (postoperative nausea and vomiting)    Snoring 11/2017    Family History  Problem Relation Age of Onset   Hypertension Mother    Diabetes Mother    Heart failure Father    Hypertension Father    Diabetes Father    Heart failure Other    Breast cancer Paternal Grandmother     Past Surgical History:  Procedure Laterality Date   ABDOMINAL HYSTERECTOMY  2005   partial - TAH, no cervix, has both ovaries; done for endometriosis   ANTERIOR CERVICAL DECOMP/DISCECTOMY FUSION  03/25/2007   C4-5   CERVICAL FUSION  2013   CHOLECYSTECTOMY N/A 03/26/2018   Procedure: LAPAROSCOPIC CHOLECYSTECTOMY;  Surgeon: Aviva Signs, MD;  Location: [facility];  Service: General;  Laterality: N/A;   KNEE ARTHROSCOPY Right    LAPAROSCOPIC GASTRIC SLEEVE RESECTION N/A 09/27/2019   Procedure: LAPAROSCOPIC GASTRIC SLEEVE RESECTION, HIATAL HERNIA REPARI ;Upper Endo, Eras Pathway;  Surgeon: Greer Pickerel, MD;  Location: [facility];  Service: General;  Laterality: N/A;   LUMBAR LAMINECTOMY/DECOMPRESSION MICRODISCECTOMY  11/21/2004   L4-5   MAXIMUM ACCESS (MAS)POSTERIOR LUMBAR INTERBODY FUSION (PLIF) 1 LEVEL N/A 09/25/2014   Procedure: Lumbar four-five MAXIMUM ACCESS (MAS) POSTERIOR LUMBAR INTERBODY FUSION (PLIF) 1  LEVEL;  Surgeon: Erline Levine, MD;  Location: [facility];  Service: Neurosurgery;  Laterality: N/A;   NASAL SEPTOPLASTY W/ TURBINOPLASTY N/A 12/07/2017   Procedure: NASAL SEPTOPLASTY WITH BILATERAL TURBINATE REDUCTION;  Surgeon: Melida Quitter, MD;  Location: [facility];  Service: ENT;  Laterality: N/A;   ORIF ANKLE FRACTURE Right    TONSILLECTOMY  06/2018   UPPER GI ENDOSCOPY     Social History   Occupational History   Not on file  Tobacco Use   Smoking status: Never   Smokeless tobacco: Never  Vaping Use   Vaping Use: Never used  Substance and Sexual Activity   Alcohol use: Yes    Comment: once in awhile   Drug use: Never   Sexual activity: Yes    Birth control/protection: Surgical    Comment: hyst

## 2023-01-03 ENCOUNTER — Other Ambulatory Visit (HOSPITAL_COMMUNITY): Payer: Self-pay

## 2023-01-05 ENCOUNTER — Other Ambulatory Visit (HOSPITAL_COMMUNITY): Payer: Self-pay

## 2023-01-05 ENCOUNTER — Other Ambulatory Visit: Payer: Self-pay

## 2023-01-05 MED ORDER — TOPIRAMATE 25 MG PO TABS
ORAL_TABLET | ORAL | 0 refills | Status: DC
  Filled 2023-01-05: qty 120, 30d supply, fill #0

## 2023-01-14 ENCOUNTER — Encounter: Payer: Self-pay | Admitting: Orthopedic Surgery

## 2023-01-27 ENCOUNTER — Other Ambulatory Visit (HOSPITAL_COMMUNITY): Payer: Self-pay

## 2023-01-27 MED ORDER — TOPIRAMATE 25 MG PO TABS
ORAL_TABLET | ORAL | 0 refills | Status: DC
  Filled 2023-01-27 – 2023-01-29 (×2): qty 120, 30d supply, fill #0

## 2023-01-28 ENCOUNTER — Other Ambulatory Visit (HOSPITAL_BASED_OUTPATIENT_CLINIC_OR_DEPARTMENT_OTHER): Payer: Self-pay

## 2023-01-29 ENCOUNTER — Other Ambulatory Visit (HOSPITAL_COMMUNITY): Payer: Self-pay

## 2023-01-30 ENCOUNTER — Other Ambulatory Visit: Payer: Self-pay

## 2023-01-30 ENCOUNTER — Other Ambulatory Visit (HOSPITAL_COMMUNITY): Payer: Self-pay

## 2023-01-30 MED ORDER — TRAZODONE HCL 150 MG PO TABS
150.0000 mg | ORAL_TABLET | Freq: Every day | ORAL | 1 refills | Status: DC
  Filled 2023-01-30 – 2023-02-10 (×2): qty 90, 90d supply, fill #0
  Filled 2023-05-07: qty 90, 90d supply, fill #1

## 2023-02-03 ENCOUNTER — Other Ambulatory Visit: Payer: Self-pay | Admitting: Family Medicine

## 2023-02-03 DIAGNOSIS — Z1231 Encounter for screening mammogram for malignant neoplasm of breast: Secondary | ICD-10-CM

## 2023-02-10 ENCOUNTER — Other Ambulatory Visit: Payer: Self-pay

## 2023-02-23 ENCOUNTER — Encounter: Payer: Self-pay | Admitting: Orthopedic Surgery

## 2023-02-24 ENCOUNTER — Ambulatory Visit (HOSPITAL_COMMUNITY)
Admission: EM | Admit: 2023-02-24 | Discharge: 2023-02-24 | Disposition: A | Discharge status: Home / Self Care | Payer: 59 | Attending: Internal Medicine | Admitting: Internal Medicine

## 2023-02-24 ENCOUNTER — Encounter (HOSPITAL_COMMUNITY): Payer: Self-pay

## 2023-02-24 DIAGNOSIS — M79645 Pain in left finger(s): Secondary | ICD-10-CM | POA: Diagnosis not present

## 2023-02-24 MED ORDER — KETOROLAC TROMETHAMINE 30 MG/ML IJ SOLN
INTRAMUSCULAR | Status: AC
  Filled 2023-02-24: qty 1

## 2023-02-24 MED ORDER — KETOROLAC TROMETHAMINE 30 MG/ML IJ SOLN
30.0000 mg | Freq: Once | INTRAMUSCULAR | Status: AC
  Administered 2023-02-24: 30 mg via INTRAMUSCULAR

## 2023-02-24 NOTE — ED Triage Notes (Signed)
Patient c/o left thumb pain since yesterday.. Patient reports pain is worse with movement. No known injury  Patient denies any medication taken for her symptoms.

## 2023-02-24 NOTE — Discharge Instructions (Addendum)
As we discussed, I feel your wrist and thumb pain may be related to carpal tunnel based on my exam.  Try wearing splint for comfort especially at night while sleeping to see if this helps relieve symptoms.  You have been given a shot of Toradol in the clinic today.  24 hours after this injection you can begin taking ibuprofen 600 mg every 6 hours as needed.  If pain continues or worsens please follow-up for further workup.

## 2023-02-24 NOTE — ED Provider Notes (Signed)
[facility]    CSN: [id number] Arrival date & time: 02/24/23  1823      History   Chief Complaint No chief complaint on file.   HPI Hannah Lynch is a 52 y.o. female presents to urgent care today with complaints of left thumb and wrist pain.  Patient states symptoms started day prior with difficulty last being brought at and buttoning pants due to discomfort.  She denies history of same, no injury to area, no history of RA or gout. Denies recent fever or chills, no locking sensation or popping.   Past Medical History:  Diagnosis Date  . Anxiety   . Degenerative disc disease, lumbar   . Degenerative disc disease, lumbar 2012   lumbar and cervical region  . Depression   . Endometriosis   . Family history of adverse reaction to anesthesia    pt's mother has hx. of post-op N/V  . Headache   . Hypertension   . Hypothyroidism   . IBS (irritable bowel syndrome)   . Nasal septal deviation 11/2017  . Nasal turbinate hypertrophy 11/2017  . PONV (postoperative nausea and vomiting)   . Snoring 11/2017    Patient Active Problem List   Diagnosis Date Noted  . Pain in right wrist 08/20/2022  . Impingement syndrome of right shoulder 08/20/2022  . Trochanteric bursitis, left hip 08/30/2020  . 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 Pain in left knee 10/12/2019  . Obesity (BMI 30-39.9) 09/27/2019  . GERD (gastroesophageal reflux disease) 09/27/2019  . Essential hypertension 09/27/2019  . Fatty liver 09/27/2019  . Metabolic syndrome 09/27/2019  . Pain in right ankle and joints of right foot 04/13/2019  . Chronic cholecystitis   . Primary insomnia 06/25/2017  . Papanicolaou smear for cervical cancer screening 06/25/2017  . Bilateral hearing loss 06/25/2017  . Acute serous otitis media 06/25/2017  . Lumbar stenosis with neurogenic claudication 09/25/2014    Past Surgical History:  Procedure Laterality Date  . ABDOMINAL HYSTERECTOMY   2005   partial - TAH, no cervix, has both ovaries; done for endometriosis  . ANTERIOR CERVICAL DECOMP/DISCECTOMY FUSION  03/25/2007   C4-5  . CERVICAL FUSION  2013  . CHOLECYSTECTOMY N/A 03/26/2018   Procedure: LAPAROSCOPIC CHOLECYSTECTOMY;  Surgeon: Franky Macho, MD;  Location: [facility];  Service: General;  Laterality: N/A;  . KNEE ARTHROSCOPY Right   . LAPAROSCOPIC GASTRIC SLEEVE RESECTION N/A 09/27/2019   Procedure: LAPAROSCOPIC GASTRIC SLEEVE RESECTION, HIATAL HERNIA REPARI ;Upper Endo, Eras Pathway;  Surgeon: Gaynelle Adu, MD;  Location: [facility];  Service: General;  Laterality: N/A;  . LUMBAR LAMINECTOMY/DECOMPRESSION MICRODISCECTOMY  11/21/2004   L4-5  . MAXIMUM ACCESS (MAS)POSTERIOR LUMBAR INTERBODY FUSION (PLIF) 1 LEVEL N/A 09/25/2014   Procedure: Lumbar four-five MAXIMUM ACCESS (MAS) POSTERIOR LUMBAR INTERBODY FUSION (PLIF) 1 LEVEL;  Surgeon: Maeola Harman, MD;  Location: [facility];  Service: Neurosurgery;  Laterality: N/A;  . NASAL SEPTOPLASTY W/ TURBINOPLASTY N/A 12/07/2017   Procedure: NASAL SEPTOPLASTY WITH BILATERAL TURBINATE REDUCTION;  Surgeon: Christia Reading, MD;  Location: [facility];  Service: ENT;  Laterality: N/A;  . ORIF ANKLE FRACTURE Right   . TONSILLECTOMY  06/2018  . UPPER GI ENDOSCOPY      OB History     Gravida  2   Para      Term      Preterm      AB      Living  SAB      IAB      Ectopic      Multiple      Live Births               Home Medications    Prior to Admission medications   Medication Sig Start Date End Date Taking? Authorizing Provider  cyclobenzaprine (FLEXERIL) 10 MG tablet TAKE 1 TABLET BY MOUTH EVERY 8 HOURS AS NEEDED FOR MUSCLE SPASM 06/02/22   Particia Nearing, PA-C  cyclobenzaprine (FLEXERIL) 10 MG tablet Take one tablet (10 mg dose) by mouth 3 (three) times a day as needed for Muscle spasms for up to 10 days. 08/14/22     hyoscyamine (ANASPAZ) 0.125 MG TBDP disintergrating tablet Allow  1 tablet to dissolve on the tongue every 6 hours as needed 01/27/22     ibuprofen (ADVIL) 800 MG tablet Take 1 tablet (800 mg total) by mouth every 8 (eight) hours as needed. 07/17/22   Leath-Warren, Sadie Haber, NP  levothyroxine (SYNTHROID) 25 MCG tablet Take 1 tablet (25 mcg total) by mouth daily. 09/02/22     meloxicam (MOBIC) 15 MG tablet Take 1 tablet (15 mg total) by mouth daily. 08/11/22     methocarbamol (ROBAXIN) 500 MG tablet Take 1 tablet (500 mg total) by mouth 2 (two) times daily. 07/17/22   Leath-Warren, Sadie Haber, NP  metoprolol succinate (TOPROL-XL) 25 MG 24 hr tablet Take one tablet (25 mg dose) by mouth daily. 02/06/22     montelukast (SINGULAIR) 10 MG tablet Take 1 tablet (10 mg total) by mouth daily. 11/22/22     Multiple Vitamin (MULTIVITAMIN) tablet Take 1 tablet by mouth daily.    [provider]  ondansetron (ZOFRAN-ODT) 4 MG disintegrating tablet Take 1 tablet (4 mg total) by mouth every 8 (eight) hours as needed for nausea or vomiting. 08/28/22   Jeannie Fend, PA-C  oseltamivir (TAMIFLU) 75 MG capsule Take 1 capsule (75 mg total) by mouth every 12 (twelve) hours. 11/10/22   Immordino, Jeannett Senior, FNP  predniSONE (DELTASONE) 50 MG tablet Take 1 tablet (50 mg total) by mouth daily with breakfast. 06/08/22   Wallis Bamberg, PA-C  rifaximin (XIFAXAN) 550 MG TABS tablet Take 1 tablet by mouth three times a day for 14 days 04/18/22     tiZANidine (ZANAFLEX) 4 MG tablet Take 1 tablet (4 mg total) by mouth at bedtime. 06/08/22   Wallis Bamberg, PA-C  tiZANidine (ZANAFLEX) 4 MG tablet Take 1 tablet (4 mg total) by mouth 3 (three) times daily. 08/11/22     topiramate (TOPAMAX) 25 MG tablet Take 1 tab by mouth daily for 1 week, then 1 tab 2x daily for 1 week, then 1 tab in the AM and 2 tabs in the PM for 1 week, then 2 tabs in the AM and 2 tabs in the PM. 01/27/23     traZODone (DESYREL) 150 MG tablet Take 1 tablet (150 mg total) by mouth at bedtime. 01/30/23     pantoprazole (PROTONIX) 40 MG  tablet Take one tablet (40 mg dose) by mouth daily. 02/06/22 07/23/22      Family History Family History  Problem Relation Age of Onset  . Hypertension Mother   . Diabetes Mother   . Heart failure Father   . Hypertension Father   . Diabetes Father   . Heart failure Other   . Breast cancer Paternal Grandmother     Social History Social History   Tobacco Use  . Smoking status:  Never  . Smokeless tobacco: Never  Vaping Use  . Vaping Use: Never used  Substance Use Topics  . Alcohol use: Yes    Comment: once in awhile  . Drug use: Never     Allergies   Patient has no known allergies.   Review of Systems As stated in HPI otherwise negative   Physical Exam Triage Vital Signs ED Triage Vitals [02/24/23 1916]  Enc Vitals Group     BP 102/73     Pulse Rate 73     Resp 14     Temp 98.2 F (36.8 C)     Temp Source Oral     SpO2 98 %     Weight      Height      Head Circumference      Peak Flow      Pain Score 6     Pain Loc      Pain Edu?      Excl. in GC?    No data found.  Updated Vital Signs BP 102/73 (BP Location: Left Arm)   Pulse 73   Temp 98.2 F (36.8 C) (Oral)   Resp 14   SpO2 98%   Visual Acuity Right Eye Distance:   Left Eye Distance:   Bilateral Distance:    Right Eye Near:   Left Eye Near:    Bilateral Near:     Physical Exam Constitutional:      General: She is not in acute distress.    Appearance: Normal appearance. She is not ill-appearing or toxic-appearing.  Musculoskeletal:        General: No deformity or signs of injury. Normal range of motion.     Comments: Normal ROM of left thumb.  No clicking or locking of joint.  No erythema or warmth.  Sensation intact + Tinel sign on left wrist.  Skin:    General: Skin is warm and dry.     Findings: No bruising, erythema or lesion.  Neurological:     General: No focal deficit present.     Mental Status: She is alert and oriented to person, place, and time.     Sensory: No sensory  deficit.  Psychiatric:        Mood and Affect: Mood normal.        Behavior: Behavior normal.     UC Treatments / Results  Labs (all labs ordered are listed, but only abnormal results are displayed) Labs Reviewed - No data to display  EKG   Radiology No results found.  Procedures Procedures (including critical care time)  Medications Ordered in UC Medications  ketorolac (TORADOL) 30 MG/ML injection 30 mg (30 mg Intramuscular Given 02/24/23 1954)    Initial Impression / Assessment and Plan / UC Course  I have reviewed the triage vital signs and the nursing notes.  Pertinent labs & imaging results that were available during my care of the patient were reviewed by me and considered in my medical decision making (see chart for details).  Left wrist pain -Patient reports awaking with pain on day prior to visit.  Exam is consistent with carpal tunnel.  No trauma or Final Clinical Impressions(s) / UC Diagnoses   Final diagnoses:  Pain of left thumb     Discharge Instructions      As we discussed, I feel your wrist and thumb pain may be related to carpal tunnel based on my exam.  Try wearing splint for comfort especially at night while  sleeping to see if this helps relieve symptoms.  You have been given a shot of Toradol in the clinic today.  24 hours after this injection you can begin taking ibuprofen 600 mg every 6 hours as needed.  If pain continues or worsens please follow-up for further workup.     ED Prescriptions   None    PDMP not reviewed this encounter.

## 2023-02-25 ENCOUNTER — Other Ambulatory Visit: Payer: Self-pay

## 2023-02-25 ENCOUNTER — Other Ambulatory Visit (HOSPITAL_COMMUNITY): Payer: Self-pay

## 2023-02-25 DIAGNOSIS — F331 Major depressive disorder, recurrent, moderate: Secondary | ICD-10-CM | POA: Diagnosis not present

## 2023-02-25 DIAGNOSIS — M79645 Pain in left finger(s): Secondary | ICD-10-CM | POA: Diagnosis not present

## 2023-02-25 DIAGNOSIS — Z13220 Encounter for screening for lipoid disorders: Secondary | ICD-10-CM | POA: Diagnosis not present

## 2023-02-25 DIAGNOSIS — Z0000 Encounter for general adult medical examination without abnormal findings: Secondary | ICD-10-CM | POA: Diagnosis not present

## 2023-02-25 DIAGNOSIS — F5101 Primary insomnia: Secondary | ICD-10-CM | POA: Diagnosis not present

## 2023-02-25 DIAGNOSIS — M5030 Other cervical disc degeneration, unspecified cervical region: Secondary | ICD-10-CM | POA: Diagnosis not present

## 2023-02-25 DIAGNOSIS — K219 Gastro-esophageal reflux disease without esophagitis: Secondary | ICD-10-CM | POA: Diagnosis not present

## 2023-02-25 DIAGNOSIS — F411 Generalized anxiety disorder: Secondary | ICD-10-CM | POA: Diagnosis not present

## 2023-02-25 DIAGNOSIS — I10 Essential (primary) hypertension: Secondary | ICD-10-CM | POA: Diagnosis not present

## 2023-02-25 DIAGNOSIS — E039 Hypothyroidism, unspecified: Secondary | ICD-10-CM | POA: Diagnosis not present

## 2023-02-25 MED ORDER — METOPROLOL SUCCINATE ER 25 MG PO TB24
25.0000 mg | ORAL_TABLET | Freq: Every day | ORAL | 3 refills | Status: DC
  Filled 2023-02-25: qty 90, 90d supply, fill #0
  Filled 2023-03-22: qty 90, 90d supply, fill #1

## 2023-02-25 MED ORDER — DICLOFENAC SODIUM 75 MG PO TBEC
75.0000 mg | DELAYED_RELEASE_TABLET | Freq: Two times a day (BID) | ORAL | 1 refills | Status: DC
  Filled 2023-02-25: qty 60, 30d supply, fill #0
  Filled 2023-03-22: qty 60, 30d supply, fill #1

## 2023-02-25 MED ORDER — TOPIRAMATE 25 MG PO TABS
50.0000 mg | ORAL_TABLET | Freq: Two times a day (BID) | ORAL | 0 refills | Status: DC
  Filled 2023-02-25: qty 120, 30d supply, fill #0

## 2023-02-25 NOTE — Telephone Encounter (Signed)
Done thx

## 2023-03-22 ENCOUNTER — Other Ambulatory Visit (HOSPITAL_COMMUNITY): Payer: Self-pay

## 2023-03-23 ENCOUNTER — Other Ambulatory Visit: Payer: Self-pay

## 2023-03-23 ENCOUNTER — Other Ambulatory Visit (HOSPITAL_COMMUNITY): Payer: Self-pay

## 2023-03-23 MED ORDER — TOPIRAMATE 25 MG PO TABS
50.0000 mg | ORAL_TABLET | Freq: Two times a day (BID) | ORAL | 0 refills | Status: DC
  Filled 2023-03-23: qty 120, 30d supply, fill #0

## 2023-03-30 ENCOUNTER — Telehealth: Payer: Self-pay | Admitting: Orthopedic Surgery

## 2023-03-30 ENCOUNTER — Ambulatory Visit
Admission: RE | Admit: 2023-03-30 | Discharge: 2023-03-30 | Disposition: A | Discharge status: Home / Self Care | Payer: 59 | Source: Ambulatory Visit | Attending: Family Medicine | Admitting: Family Medicine

## 2023-03-30 DIAGNOSIS — M546 Pain in thoracic spine: Secondary | ICD-10-CM | POA: Diagnosis not present

## 2023-03-30 DIAGNOSIS — Z1231 Encounter for screening mammogram for malignant neoplasm of breast: Secondary | ICD-10-CM | POA: Diagnosis not present

## 2023-03-30 DIAGNOSIS — M5134 Other intervertebral disc degeneration, thoracic region: Secondary | ICD-10-CM | POA: Diagnosis not present

## 2023-03-30 NOTE — Telephone Encounter (Signed)
auth &$25.00 cash received today from patient for [facility] forms. To [employer].

## 2023-03-31 ENCOUNTER — Other Ambulatory Visit: Payer: Self-pay

## 2023-04-09 DIAGNOSIS — M546 Pain in thoracic spine: Secondary | ICD-10-CM | POA: Diagnosis not present

## 2023-04-09 DIAGNOSIS — M4804 Spinal stenosis, thoracic region: Secondary | ICD-10-CM | POA: Diagnosis not present

## 2023-04-09 DIAGNOSIS — M5134 Other intervertebral disc degeneration, thoracic region: Secondary | ICD-10-CM | POA: Diagnosis not present

## 2023-04-09 DIAGNOSIS — Z780 Asymptomatic menopausal state: Secondary | ICD-10-CM | POA: Diagnosis not present

## 2023-04-09 DIAGNOSIS — M47814 Spondylosis without myelopathy or radiculopathy, thoracic region: Secondary | ICD-10-CM | POA: Diagnosis not present

## 2023-04-09 DIAGNOSIS — M5184 Other intervertebral disc disorders, thoracic region: Secondary | ICD-10-CM | POA: Diagnosis not present

## 2023-04-13 DIAGNOSIS — M5030 Other cervical disc degeneration, unspecified cervical region: Secondary | ICD-10-CM | POA: Diagnosis not present

## 2023-04-13 DIAGNOSIS — M47812 Spondylosis without myelopathy or radiculopathy, cervical region: Secondary | ICD-10-CM | POA: Diagnosis not present

## 2023-04-13 DIAGNOSIS — Z981 Arthrodesis status: Secondary | ICD-10-CM | POA: Diagnosis not present

## 2023-04-13 DIAGNOSIS — M4802 Spinal stenosis, cervical region: Secondary | ICD-10-CM | POA: Diagnosis not present

## 2023-04-13 DIAGNOSIS — G894 Chronic pain syndrome: Secondary | ICD-10-CM | POA: Diagnosis not present

## 2023-04-19 ENCOUNTER — Other Ambulatory Visit (HOSPITAL_COMMUNITY): Payer: Self-pay

## 2023-04-20 ENCOUNTER — Other Ambulatory Visit (HOSPITAL_COMMUNITY): Payer: Self-pay

## 2023-04-20 ENCOUNTER — Other Ambulatory Visit: Payer: Self-pay

## 2023-04-20 MED ORDER — TOPIRAMATE 25 MG PO TABS
50.0000 mg | ORAL_TABLET | Freq: Two times a day (BID) | ORAL | 0 refills | Status: DC
  Filled 2023-04-20: qty 120, 30d supply, fill #0

## 2023-04-20 NOTE — Pre-Procedure Instructions (Signed)
Surgical Instructions    Your procedure is scheduled on April 28, 2023.  Report to [facility] Main Entrance "A" at 9:30 A.M., then check in with the Admitting office.  Call this number if you have problems the morning of surgery:  [phone]  If you have any questions prior to your surgery date call [phone]: Open Monday-Friday 8am-4pm If you experience any cold or flu symptoms such as cough, fever, chills, shortness of breath, etc. between now and your scheduled surgery, please notify us at the above number.     Remember:  Do not eat after midnight the night before your surgery  You may drink clear liquids until 8:30 AM the morning of your surgery.   Clear liquids allowed are: Water, Non-Citrus Juices (without pulp), Carbonated Beverages, Clear Tea, Black Coffee Only (NO MILK, CREAM OR POWDERED CREAMER of any kind), and Gatorade.  Patient Instructions  The night before surgery:  No food after midnight. ONLY clear liquids after midnight  The day of surgery (if you do NOT have diabetes):  Drink ONE (1) Pre-Surgery Clear Ensure by 8:30 AM the morning of surgery. Drink in one sitting. Do not sip.  This drink was given to you during your hospital  pre-op appointment visit.  Nothing else to drink after completing the  Pre-Surgery Clear Ensure.         If you have questions, please contact your surgeon's office.     Take these medicines the morning of surgery with A SIP OF WATER:  docusate sodium (COLACE)   levothyroxine (SYNTHROID)   methocarbamol (ROBAXIN)   metoprolol succinate (TOPROL-XL)   montelukast (SINGULAIR)   topiramate (TOPAMAX)    As of today, STOP taking any Aspirin (unless otherwise instructed by your surgeon) Aleve, Naproxen, Ibuprofen, Motrin, Advil, Goody's, BC's, all herbal medications, fish oil, and all vitamins. This includes your medication: diclofenac (VOLTAREN) tablet                     Do NOT Smoke (Tobacco/Vaping) for 24 hours prior to your  procedure.  If you use a CPAP at night, you may bring your mask/headgear for your overnight stay.   Contacts, glasses, piercing's, hearing aid's, dentures or partials may not be worn into surgery, please bring cases for these belongings.    For patients admitted to the hospital, discharge time will be determined by your treatment team.   Patients discharged the day of surgery will not be allowed to drive home, and someone needs to stay with them for 24 hours.  SURGICAL WAITING ROOM VISITATION Patients having surgery or a procedure may have no more than 2 support people in the waiting area - these visitors may rotate.   Children under the age of 56 must have an adult with them who is not the patient. If the patient needs to stay at the hospital during part of their recovery, the visitor guidelines for inpatient rooms apply. Pre-op nurse will coordinate an appropriate time for 1 support person to accompany patient in pre-op.  This support person may not rotate.   Please refer to the [facility] website for the visitor guidelines for Inpatients (after your surgery is over and you are in a regular room).    Special instructions:   [facility]- Preparing For Surgery  Before surgery, you can play an important role. Because skin is not sterile, your skin needs to be as free of germs as possible. You can reduce the number of germs on your skin  by washing with CHG (chlorahexidine gluconate) Soap before surgery.  CHG is an antiseptic cleaner which kills germs and bonds with the skin to continue killing germs even after washing.    Oral Hygiene is also important to reduce your risk of infection.  Remember - BRUSH YOUR TEETH THE MORNING OF SURGERY WITH YOUR REGULAR TOOTHPASTE  Please do not use if you have an allergy to CHG or antibacterial soaps. If your skin becomes reddened/irritated stop using the CHG.  Do not shave (including legs and underarms) for at least 48 hours prior to first CHG shower. It  is OK to shave your face.  Please follow these instructions carefully.   Shower the NIGHT BEFORE SURGERY and the MORNING OF SURGERY  If you chose to wash your hair, wash your hair first as usual with your normal shampoo.  After you shampoo, rinse your hair and body thoroughly to remove the shampoo.  Use CHG Soap as you would any other liquid soap. You can apply CHG directly to the skin and wash gently with a scrungie or a clean washcloth.   Apply the CHG Soap to your body ONLY FROM THE NECK DOWN.  Do not use on open wounds or open sores. Avoid contact with your eyes, ears, mouth and genitals (private parts). Wash Face and genitals (private parts)  with your normal soap.   Wash thoroughly, paying special attention to the area where your surgery will be performed.  Thoroughly rinse your body with warm water from the neck down.  DO NOT shower/wash with your normal soap after using and rinsing off the CHG Soap.  Pat yourself dry with a CLEAN TOWEL.  Wear CLEAN PAJAMAS to bed the night before surgery  Place CLEAN SHEETS on your bed the night before your surgery  DO NOT SLEEP WITH PETS.   Day of Surgery: Take a shower with CHG soap. Do not wear jewelry or makeup Do not wear lotions, powders, perfumes/colognes, or deodorant. Do not shave 48 hours prior to surgery.  Men may shave face and neck. Do not bring valuables to the hospital.  [facility] is not responsible for any belongings or valuables. Do not wear nail polish, gel polish, artificial nails, or any other type of covering on natural nails (fingers and toes) If you have artificial nails or gel coating that need to be removed by a nail salon, please have this removed prior to surgery. Artificial nails or gel coating may interfere with anesthesia's ability to adequately monitor your vital signs.  Wear Clean/Comfortable clothing the morning of surgery Remember to brush your teeth WITH YOUR REGULAR TOOTHPASTE.   Please read over  the following fact sheets that you were given.    If you received a COVID test during your pre-op visit  it is requested that you wear a mask when out in public, stay away from anyone that may not be feeling well and notify your surgeon if you develop symptoms. If you have been in contact with anyone that has tested positive in the last 10 days please notify you surgeon.

## 2023-04-21 ENCOUNTER — Encounter (HOSPITAL_COMMUNITY)
Admission: RE | Admit: 2023-04-21 | Discharge: 2023-04-21 | Disposition: A | Discharge status: Home / Self Care | Payer: 59 | Source: Ambulatory Visit | Attending: Orthopedic Surgery | Admitting: Orthopedic Surgery

## 2023-04-21 ENCOUNTER — Encounter (HOSPITAL_COMMUNITY): Payer: Self-pay

## 2023-04-21 ENCOUNTER — Other Ambulatory Visit: Payer: Self-pay

## 2023-04-21 DIAGNOSIS — Z01812 Encounter for preprocedural laboratory examination: Secondary | ICD-10-CM | POA: Insufficient documentation

## 2023-04-21 DIAGNOSIS — R001 Bradycardia, unspecified: Secondary | ICD-10-CM | POA: Diagnosis not present

## 2023-04-21 DIAGNOSIS — R000 Tachycardia, unspecified: Secondary | ICD-10-CM | POA: Insufficient documentation

## 2023-04-21 DIAGNOSIS — I493 Ventricular premature depolarization: Secondary | ICD-10-CM | POA: Insufficient documentation

## 2023-04-21 DIAGNOSIS — Z01818 Encounter for other preprocedural examination: Secondary | ICD-10-CM

## 2023-04-21 DIAGNOSIS — I10 Essential (primary) hypertension: Secondary | ICD-10-CM | POA: Insufficient documentation

## 2023-04-21 HISTORY — DX: Personal history of other diseases of the digestive system: Z87.19

## 2023-04-21 LAB — BASIC METABOLIC PANEL
Anion gap: 5 (ref 5–15)
BUN: 11 mg/dL (ref 6–20)
CO2: 25 mmol/L (ref 22–32)
Calcium: 8.9 mg/dL (ref 8.9–10.3)
Chloride: 108 mmol/L (ref 98–111)
Creatinine, Ser: 1.02 mg/dL — ABNORMAL HIGH (ref 0.44–1.00)
GFR, Estimated: >60 mL/min (ref >60)
Glucose, Bld: 87 mg/dL (ref 70–99)
Potassium: 3.7 mmol/L (ref 3.5–5.1)
Sodium: 138 mmol/L (ref 135–145)

## 2023-04-21 LAB — CBC
HCT: 40 % (ref 36.0–46.0)
Hemoglobin: 13.6 g/dL (ref 12.0–15.0)
MCH: 32.9 pg (ref 26.0–34.0)
MCHC: 34 g/dL (ref 30.0–36.0)
MCV: 96.6 fL (ref 80.0–100.0)
Platelets: 191 10*3/uL (ref 150–400)
RBC: 4.14 MIL/uL (ref 3.87–5.11)
RDW: 12.1 % (ref 11.5–15.5)
WBC: 5.3 10*3/uL (ref 4.0–10.5)
nRBC: 0 % (ref 0.0–0.2)

## 2023-04-21 NOTE — Progress Notes (Addendum)
PCP - Dr. Antony Haste Cardiologist - Dr. Fawn Kirk - Pt sees for coronary spasms/palpitations which is why she has had so much cardiac testing. Last office visit was earlier this year. Next office visit is in July. She recently completed a Coronary CT as well which was negative.  PPM/ICD - Denies Device Orders - n/a Rep Notified - n/a  Chest x-ray - 10/13/2022 EKG - 10/13/2022 Stress Test - 12/16/2021 ECHO - 12/16/2021 Cardiac Cath - 10/16/2020  Sleep Study - Pt has had sleep study but negative result CPAP - n/a  No DM  Last dose of GLP1 agonist- n/a GLP1 instructions: n/a  Blood Thinner Instructions: n/a Aspirin Instructions: n/a  ERAS Protcol - Clear liquids until 0830 morning of surgery PRE-SURGERY Ensure or G2- Ensure given to pt with instructions  COVID TEST- n/a   Anesthesia review: Yes. Hx of coronary spasms with regular cardiology office visits.    Patient denies shortness of breath, fever, cough and chest pain at PAT appointment. Pt denies any respiratory illness/infection in the last two months.   All instructions explained to the patient, with a verbal understanding of the material. Patient agrees to go over the instructions while at home for a better understanding. Patient also instructed to self quarantine after being tested for COVID-19. The opportunity to ask questions was provided.

## 2023-04-22 NOTE — Anesthesia Preprocedure Evaluation (Addendum)
Anesthesia Evaluation  Patient identified by MRN, date of birth, ID band Patient awake    Reviewed: Allergy & Precautions, NPO status , Patient's Chart, lab work & pertinent test results  History of Anesthesia Complications (+) PONV and history of anesthetic complications  Airway Mallampati: II  TM Distance: >3 FB Neck ROM: Full    Dental no notable dental hx. (+) Edentulous Upper, Edentulous Lower   Pulmonary neg pulmonary ROS   Pulmonary exam normal        Cardiovascular hypertension, Pt. on medications and Pt. on home beta blockers  Rhythm:Regular Rate:Normal     Neuro/Psych  Headaches  Anxiety Depression       GI/Hepatic Neg liver ROS, hiatal hernia,GERD  ,,  Endo/Other  Hypothyroidism    Renal/GU negative Renal ROS  negative genitourinary   Musculoskeletal  (+) Arthritis , Osteoarthritis,    Abdominal Normal abdominal exam  (+)   Peds  Hematology negative hematology ROS (+)   Anesthesia Other Findings   Reproductive/Obstetrics                             Anesthesia Physical Anesthesia Plan  ASA: 2  Anesthesia Plan: General   Post-op Pain Management: Regional block*   Induction: Intravenous  PONV Risk Score and Plan: 4 or greater and Ondansetron, Dexamethasone, Midazolam, Treatment may vary due to age or medical condition, Propofol infusion and TIVA  Airway Management Planned: Mask and Oral ETT  Additional Equipment: None  Intra-op Plan:   Post-operative Plan: Extubation in OR  Informed Consent: I have reviewed the patients History and Physical, chart, labs and discussed the procedure including the risks, benefits and alternatives for the proposed anesthesia with the patient or authorized representative who has indicated his/her understanding and acceptance.     Dental advisory given  Plan Discussed with: CRNA  Anesthesia Plan Comments: (PAT note by Antionette Poles,  PA-C: Follows with cardiology at [facility] for history of HTN and chronic noncardiac chest pain.  Prior cardiac catheterization in 2021 showed no significant CAD.  Stress echocardiogram 12/2021 showed no evidence of ischemia, however she did have suboptimal exercise capacity.  She was last seen 11/26/2022 and reported change in quality of pain and stated it was more frequent and other some in her daily life.  Due to suboptimal exercise capacity on recent stress echo, coronary CTA was ordered.  CTA 01/05/2023 showed coronary calcium score of 0 with no coronary plaque or stenosis.  History of hiatal hernia.  Preop labs reviewed, unremarkable.  EKG 10/13/2022: NSR.  Rate 83.  Low voltage QRS.  Possible inferior infarct, age-indeterminate.  Coronary CTA 01/05/2023 (Care Everywhere): IMPRESSION:  -Calcium score of 0.  - Low normal left ventricular function with EF of 50%.  - Normal origin and trajectory of coronary arteries.  - No coronary plaque or stenosis.  - An addendum commenting on non-cardiac structures to follow.   30-day event monitor 09/16/2022 (Care Everywhere): 1. Normal sinus rhythm predominates recording 2. Rare PACs are noted 3. Rare PVCs are noted 4. No atrial fibrillation is recorded 5. No significant pauses are noted 6. Sinus tachycardia is noted during daytime hours, sinus bradycardia is noted during evening hours 7. Benign recording  Stress echocardiogram 12/16/2021 (Care Everywhere): Left Ventricle: The left ventricular systolic function at rest is 60-65%. Left Ventricle: Wall motion is normal. Mitral Valve: There is mild regurgitation. Tricuspid Valve: There is mild to moderate regurgitation. Stress ECG: ECG Conclusion: #1  ST segment changes occurred with stress that did not meet ischemic criteria. #2 she did have some chest discomfort with exertion that resolved in recovery. #3 normal blood pressure response. #4 no arrhythmia. #5 suboptimal exercise capacity. Post-stress  Impression: The stress echo showed no evidence of ischemia. However, she did have some chest discomfort with exercise. The ECG showed nonspecific changes.  Cath 10/16/2020 (Care Everywhere): Findings:  - No significant coronary artery disease: Coronary arteries are totally  within normal limits without significant disease  - Left ventricular filling pressures (LVEDP = 15 mm Hg).  - Left ventricular contractile function (estimated LVEF = 55%).   Recommendations:  -For other etiology of symptoms   Complications:   None    )        Anesthesia Quick Evaluation

## 2023-04-22 NOTE — Progress Notes (Signed)
Anesthesia Chart Review:  Follows with cardiology at [facility] for history of HTN and chronic noncardiac chest pain.  Prior cardiac catheterization in 2021 showed no significant CAD.  Stress echocardiogram 12/2021 showed no evidence of ischemia, however she did have suboptimal exercise capacity.  She was last seen 11/26/2022 and reported change in quality of pain and stated it was more frequent and other some in her daily life.  Due to suboptimal exercise capacity on recent stress echo, coronary CTA was ordered.  CTA 01/05/2023 showed coronary calcium score of 0 with no coronary plaque or stenosis.  History of hiatal hernia.  Preop labs reviewed, unremarkable.  EKG 10/13/2022: NSR.  Rate 83.  Low voltage QRS.  Possible inferior infarct, age-indeterminate.  Coronary CTA 01/05/2023 (Care Everywhere): IMPRESSION:  - Calcium score of 0.   -  Low normal left ventricular function with EF of 50%.  -  Normal origin and trajectory of coronary arteries.  -  No coronary plaque or stenosis.  -  An addendum commenting on non-cardiac structures to follow.   30-day event monitor 09/16/2022 (Care Everywhere): 1. Normal sinus rhythm predominates recording 2. Rare PACs are noted 3. Rare PVCs are noted 4. No atrial fibrillation is recorded 5. No significant pauses are noted 6. Sinus tachycardia is noted during daytime hours, sinus bradycardia is noted during evening hours 7. Benign recording  Stress echocardiogram 12/16/2021 (Care Everywhere): Left Ventricle: The left ventricular systolic function at rest is 60-65%. Left Ventricle: Wall motion is normal. Mitral Valve: There is mild regurgitation. Tricuspid Valve: There is mild to moderate regurgitation. Stress ECG: ECG Conclusion: #1 ST segment changes occurred with stress that did not meet ischemic criteria. #2 she did have some chest discomfort with exertion that resolved in recovery. #3 normal blood pressure response. #4 no arrhythmia. #5 suboptimal exercise  capacity. Post-stress Impression: The stress echo showed no evidence of ischemia. However, she did have some chest discomfort with exercise. The ECG showed nonspecific changes.  Cath 10/16/2020 (Care Everywhere): Findings:  - No significant coronary artery disease: Coronary arteries are totally  within normal limits without significant disease  - Left ventricular filling pressures (LVEDP = 15 mm Hg).  - Left ventricular contractile function (estimated LVEF = 55%).   Recommendations:  -For other etiology of symptoms   Complications:   None      Zannie Cove [facility]/Anesthesiology Phone [phone] 04/22/2023 11:39 AM

## 2023-04-28 ENCOUNTER — Encounter (HOSPITAL_COMMUNITY)
Admission: RE | Disposition: A | Discharge status: Home / Self Care | Payer: Self-pay | Source: Ambulatory Visit | Attending: Orthopedic Surgery

## 2023-04-28 ENCOUNTER — Encounter (HOSPITAL_COMMUNITY): Payer: Self-pay | Admitting: Orthopedic Surgery

## 2023-04-28 ENCOUNTER — Other Ambulatory Visit: Payer: Self-pay

## 2023-04-28 ENCOUNTER — Ambulatory Visit (HOSPITAL_BASED_OUTPATIENT_CLINIC_OR_DEPARTMENT_OTHER): Payer: 59

## 2023-04-28 ENCOUNTER — Ambulatory Visit (HOSPITAL_COMMUNITY): Payer: 59 | Admitting: Physician Assistant

## 2023-04-28 ENCOUNTER — Ambulatory Visit (HOSPITAL_COMMUNITY)
Admission: RE | Admit: 2023-04-28 | Discharge: 2023-04-28 | Disposition: A | Discharge status: Home / Self Care | Payer: 59 | Source: Ambulatory Visit | Attending: Orthopedic Surgery | Admitting: Orthopedic Surgery

## 2023-04-28 DIAGNOSIS — Z79899 Other long term (current) drug therapy: Secondary | ICD-10-CM | POA: Insufficient documentation

## 2023-04-28 DIAGNOSIS — I10 Essential (primary) hypertension: Secondary | ICD-10-CM | POA: Diagnosis not present

## 2023-04-28 DIAGNOSIS — R519 Headache, unspecified: Secondary | ICD-10-CM | POA: Diagnosis not present

## 2023-04-28 DIAGNOSIS — M19011 Primary osteoarthritis, right shoulder: Secondary | ICD-10-CM | POA: Diagnosis not present

## 2023-04-28 DIAGNOSIS — E039 Hypothyroidism, unspecified: Secondary | ICD-10-CM | POA: Insufficient documentation

## 2023-04-28 DIAGNOSIS — F418 Other specified anxiety disorders: Secondary | ICD-10-CM | POA: Diagnosis not present

## 2023-04-28 DIAGNOSIS — M7551 Bursitis of right shoulder: Secondary | ICD-10-CM | POA: Diagnosis not present

## 2023-04-28 DIAGNOSIS — Z01818 Encounter for other preprocedural examination: Secondary | ICD-10-CM

## 2023-04-28 DIAGNOSIS — G8918 Other acute postprocedural pain: Secondary | ICD-10-CM | POA: Diagnosis not present

## 2023-04-28 SURGERY — SHOULDER ARTHROSCOPY WITH SUBACROMIAL DECOMPRESSION AND DISTAL CLAVICLE EXCISION
Anesthesia: General | Laterality: Right

## 2023-04-28 MED ORDER — BUPIVACAINE HCL (PF) 0.5 % IJ SOLN
INTRAMUSCULAR | Status: DC | PRN
  Administered 2023-04-28: 10 mL via PERINEURAL

## 2023-04-28 MED ORDER — EPINEPHRINE PF 1 MG/ML IJ SOLN
INTRAMUSCULAR | Status: AC
  Filled 2023-04-28: qty 2

## 2023-04-28 MED ORDER — SODIUM CHLORIDE (PF) 0.9 % IJ SOLN
INTRAMUSCULAR | Status: DC | PRN
  Administered 2023-04-28: 20 mL

## 2023-04-28 MED ORDER — LIDOCAINE 2% (20 MG/ML) 5 ML SYRINGE
INTRAMUSCULAR | Status: AC
  Filled 2023-04-28: qty 5

## 2023-04-28 MED ORDER — DEXAMETHASONE SODIUM PHOSPHATE 10 MG/ML IJ SOLN
INTRAMUSCULAR | Status: DC | PRN
  Administered 2023-04-28: 10 mg via INTRAVENOUS

## 2023-04-28 MED ORDER — DEXAMETHASONE SODIUM PHOSPHATE 10 MG/ML IJ SOLN
INTRAMUSCULAR | Status: AC
  Filled 2023-04-28: qty 1

## 2023-04-28 MED ORDER — CHLORHEXIDINE GLUCONATE 0.12 % MT SOLN
15.0000 mL | Freq: Once | OROMUCOSAL | Status: AC
  Administered 2023-04-28: 15 mL via OROMUCOSAL
  Filled 2023-04-28: qty 15

## 2023-04-28 MED ORDER — SODIUM CHLORIDE 0.9 % IR SOLN
Status: DC | PRN
  Administered 2023-04-28 (×3): 3001 mL

## 2023-04-28 MED ORDER — LACTATED RINGERS IV SOLN: INTRAVENOUS | Status: DC | PRN

## 2023-04-28 MED ORDER — CEFAZOLIN SODIUM-DEXTROSE 2-4 GM/100ML-% IV SOLN
2.0000 g | INTRAVENOUS | Status: AC
  Administered 2023-04-28: 2 g via INTRAVENOUS
  Filled 2023-04-28: qty 100

## 2023-04-28 MED ORDER — MIDAZOLAM HCL 2 MG/2ML IJ SOLN: 2.0000 mg | Freq: Once | INTRAMUSCULAR | Status: AC

## 2023-04-28 MED ORDER — POVIDONE-IODINE 10 % EX SWAB
2.0000 | Freq: Once | CUTANEOUS | Status: AC
  Administered 2023-04-28: 2 via TOPICAL

## 2023-04-28 MED ORDER — FENTANYL CITRATE (PF) 100 MCG/2ML IJ SOLN: 25.0000 ug | INTRAMUSCULAR | Status: DC | PRN

## 2023-04-28 MED ORDER — ROCURONIUM BROMIDE 10 MG/ML (PF) SYRINGE
PREFILLED_SYRINGE | INTRAVENOUS | Status: AC
  Filled 2023-04-28: qty 10

## 2023-04-28 MED ORDER — FENTANYL CITRATE (PF) 100 MCG/2ML IJ SOLN
INTRAMUSCULAR | Status: AC
  Administered 2023-04-28: 50 ug via INTRAVENOUS
  Filled 2023-04-28: qty 2

## 2023-04-28 MED ORDER — FENTANYL CITRATE (PF) 250 MCG/5ML IJ SOLN
INTRAMUSCULAR | Status: DC | PRN
  Administered 2023-04-28: 50 ug via INTRAVENOUS

## 2023-04-28 MED ORDER — SUGAMMADEX SODIUM 200 MG/2ML IV SOLN
INTRAVENOUS | Status: DC | PRN
  Administered 2023-04-28: 300 mg via INTRAVENOUS

## 2023-04-28 MED ORDER — BUPIVACAINE LIPOSOME 1.3 % IJ SUSP
INTRAMUSCULAR | Status: AC
  Filled 2023-04-28: qty 10

## 2023-04-28 MED ORDER — ROCURONIUM BROMIDE 10 MG/ML (PF) SYRINGE
PREFILLED_SYRINGE | INTRAVENOUS | Status: DC | PRN
  Administered 2023-04-28: 60 mg via INTRAVENOUS

## 2023-04-28 MED ORDER — PROPOFOL 10 MG/ML IV BOLUS
INTRAVENOUS | Status: AC
  Filled 2023-04-28: qty 20

## 2023-04-28 MED ORDER — LIDOCAINE 2% (20 MG/ML) 5 ML SYRINGE
INTRAMUSCULAR | Status: DC | PRN
  Administered 2023-04-28: 40 mg via INTRAVENOUS

## 2023-04-28 MED ORDER — POVIDONE-IODINE 7.5 % EX SOLN: Freq: Once | CUTANEOUS | Status: DC

## 2023-04-28 MED ORDER — ONDANSETRON HCL 4 MG/2ML IJ SOLN
INTRAMUSCULAR | Status: DC | PRN
  Administered 2023-04-28: 4 mg via INTRAVENOUS

## 2023-04-28 MED ORDER — GABAPENTIN 300 MG PO CAPS: 300.0000 mg | ORAL_CAPSULE | Freq: Three times a day (TID) | ORAL | 0 refills | Status: DC

## 2023-04-28 MED ORDER — EPINEPHRINE PF 1 MG/ML IJ SOLN
INTRAMUSCULAR | Status: DC | PRN
  Administered 2023-04-28: .15 mL via INTRAMUSCULAR

## 2023-04-28 MED ORDER — MIDAZOLAM HCL 2 MG/2ML IJ SOLN
INTRAMUSCULAR | Status: AC
  Filled 2023-04-28: qty 2

## 2023-04-28 MED ORDER — EPINEPHRINE PF 1 MG/ML IJ SOLN
INTRAMUSCULAR | Status: AC
  Filled 2023-04-28: qty 1

## 2023-04-28 MED ORDER — OXYCODONE HCL 5 MG PO TABS: 5.0000 mg | ORAL_TABLET | ORAL | 0 refills | Status: DC | PRN

## 2023-04-28 MED ORDER — PROPOFOL 10 MG/ML IV BOLUS
INTRAVENOUS | Status: DC | PRN
  Administered 2023-04-28: 140 mg via INTRAVENOUS

## 2023-04-28 MED ORDER — PHENYLEPHRINE HCL-NACL 20-0.9 MG/250ML-% IV SOLN
INTRAVENOUS | Status: DC | PRN
  Administered 2023-04-28: 40 ug/min via INTRAVENOUS
  Administered 2023-04-28: 20 ug/min via INTRAVENOUS

## 2023-04-28 MED ORDER — BUPIVACAINE HCL (PF) 0.25 % IJ SOLN
INTRAMUSCULAR | Status: AC
  Filled 2023-04-28: qty 20

## 2023-04-28 MED ORDER — PHENYLEPHRINE 80 MCG/ML (10ML) SYRINGE FOR IV PUSH (FOR BLOOD PRESSURE SUPPORT)
PREFILLED_SYRINGE | INTRAVENOUS | Status: DC | PRN
  Administered 2023-04-28: 160 ug via INTRAVENOUS

## 2023-04-28 MED ORDER — EPHEDRINE 5 MG/ML INJ
INTRAVENOUS | Status: AC
  Filled 2023-04-28: qty 5

## 2023-04-28 MED ORDER — ONDANSETRON HCL 4 MG/2ML IJ SOLN
INTRAMUSCULAR | Status: AC
  Filled 2023-04-28: qty 2

## 2023-04-28 MED ORDER — LACTATED RINGERS IV SOLN: INTRAVENOUS | Status: DC

## 2023-04-28 MED ORDER — PHENYLEPHRINE 80 MCG/ML (10ML) SYRINGE FOR IV PUSH (FOR BLOOD PRESSURE SUPPORT)
PREFILLED_SYRINGE | INTRAVENOUS | Status: AC
  Filled 2023-04-28: qty 10

## 2023-04-28 MED ORDER — BUPIVACAINE LIPOSOME 1.3 % IJ SUSP
INTRAMUSCULAR | Status: DC | PRN
  Administered 2023-04-28: 10 mL via PERINEURAL

## 2023-04-28 MED ORDER — FENTANYL CITRATE (PF) 250 MCG/5ML IJ SOLN
INTRAMUSCULAR | Status: AC
  Filled 2023-04-28: qty 5

## 2023-04-28 MED ORDER — ACETAMINOPHEN 10 MG/ML IV SOLN: 1000.0000 mg | Freq: Once | INTRAVENOUS | Status: DC | PRN

## 2023-04-28 MED ORDER — TRANEXAMIC ACID-NACL 1000-0.7 MG/100ML-% IV SOLN
1000.0000 mg | INTRAVENOUS | Status: AC
  Administered 2023-04-28: 1000 mg via INTRAVENOUS
  Filled 2023-04-28: qty 100

## 2023-04-28 MED ORDER — PROPOFOL 500 MG/50ML IV EMUL
INTRAVENOUS | Status: DC | PRN
  Administered 2023-04-28: 150 ug/kg/min via INTRAVENOUS

## 2023-04-28 MED ORDER — GLYCOPYRROLATE PF 0.2 MG/ML IJ SOSY
PREFILLED_SYRINGE | INTRAMUSCULAR | Status: AC
  Filled 2023-04-28: qty 1

## 2023-04-28 MED ORDER — ORAL CARE MOUTH RINSE: 15.0000 mL | Freq: Once | OROMUCOSAL | Status: AC

## 2023-04-28 MED ORDER — PROPOFOL 1000 MG/100ML IV EMUL
INTRAVENOUS | Status: AC
  Filled 2023-04-28: qty 400

## 2023-04-28 MED ORDER — MIDAZOLAM HCL 2 MG/2ML IJ SOLN
INTRAMUSCULAR | Status: AC
  Administered 2023-04-28: 2 mg via INTRAVENOUS
  Filled 2023-04-28: qty 2

## 2023-04-28 MED ORDER — FENTANYL CITRATE (PF) 100 MCG/2ML IJ SOLN: 50.0000 ug | Freq: Once | INTRAMUSCULAR | Status: AC

## 2023-04-28 SURGICAL SUPPLY — 49 items
ANCH SUT SWLK 19.1X6.25 CLS (Anchor) IMPLANT
ANCHOR SUT SWIVELLOK BIO (Anchor) ×2 IMPLANT
BAG COUNTER SPONGE SURGICOUNT (BAG) ×2 IMPLANT
BAG SPNG CNTER NS LX DISP (BAG) ×1
BLADE SHAVER TORPEDO 4X13 (MISCELLANEOUS) IMPLANT
BURR OVAL 8 FLU 4.0X13 (MISCELLANEOUS) IMPLANT
DRAPE INCISE IOBAN 66X45 STRL (DRAPES) ×2 IMPLANT
DRAPE STERI 35X30 U-POUCH (DRAPES) ×2 IMPLANT
DRAPE U-SHAPE 47X51 STRL (DRAPES) ×4 IMPLANT
DRSG TEGADERM 4X4.5 CHG (GAUZE/BANDAGES/DRESSINGS) IMPLANT
DRSG TEGADERM 4X4.75 (GAUZE/BANDAGES/DRESSINGS) ×8 IMPLANT
DRSG XEROFORM 1X8 (GAUZE/BANDAGES/DRESSINGS) IMPLANT
DURAPREP 26ML APPLICATOR (WOUND CARE) ×2 IMPLANT
ELECT REM PT RETURN 9FT ADLT (ELECTROSURGICAL) ×1
ELECTRODE REM PT RTRN 9FT ADLT (ELECTROSURGICAL) ×2 IMPLANT
FILTER STRAW FLUID ASPIR (MISCELLANEOUS) ×2 IMPLANT
GAUZE SPONGE 4X4 12PLY STRL (GAUZE/BANDAGES/DRESSINGS) ×2 IMPLANT
GAUZE XEROFORM 1X8 LF (GAUZE/BANDAGES/DRESSINGS) ×2 IMPLANT
GLOVE BIOGEL PI IND STRL 8 (GLOVE) ×2 IMPLANT
GLOVE ECLIPSE 7.0 STRL STRAW (GLOVE) ×2 IMPLANT
GLOVE ECLIPSE 8.0 STRL XLNG CF (GLOVE) ×2 IMPLANT
GLOVE INDICATOR 7.5 STRL GRN (GLOVE) ×2 IMPLANT
GOWN STRL REUS W/ TWL LRG LVL3 (GOWN DISPOSABLE) ×6 IMPLANT
GOWN STRL REUS W/TWL LRG LVL3 (GOWN DISPOSABLE) ×3
KIT BASIN OR (CUSTOM PROCEDURE TRAY) ×2 IMPLANT
KIT TURNOVER KIT B (KITS) ×2 IMPLANT
MANIFOLD NEPTUNE II (INSTRUMENTS) ×2 IMPLANT
NDL SPNL 18GX3.5 QUINCKE PK (NEEDLE) ×2 IMPLANT
NDL SUT 6 .5 CRC .975X.05 MAYO (NEEDLE) ×2 IMPLANT
NEEDLE MAYO TAPER (NEEDLE) ×1
NEEDLE SPNL 18GX3.5 QUINCKE PK (NEEDLE) ×1 IMPLANT
NS IRRIG 1000ML POUR BTL (IV SOLUTION) ×2 IMPLANT
PACK SHOULDER (CUSTOM PROCEDURE TRAY) ×2 IMPLANT
PAD ARMBOARD 7.5X6 YLW CONV (MISCELLANEOUS) ×4 IMPLANT
SPONGE T-LAP 4X18 ~~LOC~~+RFID (SPONGE) ×4 IMPLANT
SUT ETHILON 3 0 PS 1 (SUTURE) ×2 IMPLANT
SUT VIC AB 0 CT1 27 (SUTURE) ×1
SUT VIC AB 0 CT1 27XBRD ANBCTR (SUTURE) ×2 IMPLANT
SUT VIC AB 2-0 CT1 (SUTURE) IMPLANT
SUT VIC AB 2-0 CT1 27 (SUTURE) ×1
SUT VIC AB 2-0 CT1 TAPERPNT 27 (SUTURE) ×2 IMPLANT
SUT VICRYL 0 UR6 27IN ABS (SUTURE) ×2 IMPLANT
SYR 20ML LL LF (SYRINGE) ×2 IMPLANT
SYR 3ML LL SCALE MARK (SYRINGE) ×2 IMPLANT
SYR TB 1ML LUER SLIP (SYRINGE) ×2 IMPLANT
TOWEL GREEN STERILE (TOWEL DISPOSABLE) ×2 IMPLANT
TOWEL GREEN STERILE FF (TOWEL DISPOSABLE) ×2 IMPLANT
TUBING ARTHROSCOPY IRRIG 16FT (MISCELLANEOUS) ×2 IMPLANT
WATER STERILE IRR 1000ML POUR (IV SOLUTION) ×2 IMPLANT

## 2023-04-28 NOTE — Transfer of Care (Signed)
Immediate Anesthesia Transfer of Care Note  Patient: Hannah Lynch  Procedure(s) Performed: RIGHT SHOULDER ARTHROSCOPY WITH SUBACROMIAL DECOMPRESSION AND ARTHROSCOPIC DISTAL CLAVICLE EXCISION (Right)  Patient Location: PACU  Anesthesia Type:General and Regional  Level of Consciousness: drowsy  Airway & Oxygen Therapy: Patient Spontanous Breathing and Patient connected to face mask oxygen  Post-op Assessment: Report given to RN, Post -op Vital signs reviewed and stable, and Patient moving all extremities  Post vital signs: stable  Last Vitals:  Vitals Value Taken Time  BP 110/82 04/28/23 1300  Temp    Pulse 65 04/28/23 1301  Resp 15 04/28/23 1301  SpO2 100 % 04/28/23 1301  Vitals shown include unvalidated device data.  Last Pain:  Vitals:   04/28/23 1100  TempSrc:   PainSc: 0-No pain         Complications: No notable events documented.

## 2023-04-28 NOTE — Anesthesia Procedure Notes (Signed)
Procedure Name: Intubation Date/Time: 04/28/2023 11:38 AM  Performed by: Kayleen Memos, CRNAPre-anesthesia Checklist: Patient identified, Emergency Drugs available, Suction available, Patient being monitored and Timeout performed Patient Re-evaluated:Patient Re-evaluated prior to induction Oxygen Delivery Method: Circle system utilized Preoxygenation: Pre-oxygenation with 100% oxygen Induction Type: IV induction Ventilation: Mask ventilation without difficulty Laryngoscope Size: Glidescope and 3 Grade View: Grade I Tube type: Oral Tube size: 7.0 mm Number of attempts: 1 Airway Equipment and Method: Rigid stylet and Video-laryngoscopy Placement Confirmation: ETT inserted through vocal cords under direct vision, positive ETCO2, CO2 detector and breath sounds checked- equal and bilateral Secured at: 21 cm Tube secured with: Tape Dental Injury: Teeth and Oropharynx as per pre-operative assessment

## 2023-04-28 NOTE — Brief Op Note (Signed)
   04/28/2023  5:48 PM  PATIENT:  Hannah Lynch  52 y.o. female  PRE-OPERATIVE DIAGNOSIS:  right shoulder acromioclavicular joint arthritis, bursitis  POST-OPERATIVE DIAGNOSIS:  right shoulder acromioclavicular joint arthritis, bursitis  PROCEDURE:  Procedure(s): RIGHT LIMITED DEBRIDEMENT OF THE SUPERIOR LABRUM AND ARTHROSCOPIC SUBACROMIAL DECOMPRESSION WITH ACROMIOPLASTY AND ARTHROSCOPIC DISTAL CLAVICLE EXCISION  SURGEON:  Surgeon(s): August Saucer, Corrie Mckusick, MD  ASSISTANT: Karenann Cai, PA  ANESTHESIA:   General  EBL: 5 ml    Total I/O In: 375 [P.O.:75; I.V.:100; IV Piggyback:200] Out: 10 [Blood:10]  BLOOD ADMINISTERED: none  DRAINS: None  LOCAL MEDICATIONS USED:  none  SPECIMEN:  No Specimen  COUNTS:  YES  TOURNIQUET:  * No tourniquets in log *  DICTATION: .[id number]  PLAN OF CARE: Discharge to home after PACU  PATIENT DISPOSITION:  PACU - hemodynamically stable

## 2023-04-28 NOTE — Anesthesia Postprocedure Evaluation (Signed)
Anesthesia Post Note  Patient: Hannah Lynch  Procedure(s) Performed: RIGHT SHOULDER ARTHROSCOPY WITH SUBACROMIAL DECOMPRESSION AND ARTHROSCOPIC DISTAL CLAVICLE EXCISION (Right)     Patient location during evaluation: PACU Anesthesia Type: General and Regional Level of consciousness: awake and alert Pain management: pain level controlled Vital Signs Assessment: post-procedure vital signs reviewed and stable Respiratory status: spontaneous breathing, nonlabored ventilation, respiratory function stable and patient connected to nasal cannula oxygen Cardiovascular status: blood pressure returned to baseline and stable Postop Assessment: no apparent nausea or vomiting Anesthetic complications: no   No notable events documented.  Last Vitals:  Vitals:   04/28/23 1400 04/28/23 1415  BP: 110/81 116/75  Pulse: 65 60  Resp: 17 15  Temp:  36.5 C  SpO2: 98% 98%    Last Pain:  Vitals:   04/28/23 1415  TempSrc:   PainSc: 0-No pain                 Earl Lites P Zarria Towell

## 2023-04-28 NOTE — Op Note (Signed)
Hannah Lynch, Hannah Lynch MEDICAL RECORD NO: [id number] ACCOUNT NO: [id number] DATE OF BIRTH: 1971-09-20 FACILITY: [facility] LOCATION: [facility] PHYSICIAN: Graylin Shiver. August Saucer, MD  Operative Report   DATE OF PROCEDURE: 04/28/2023  PREOPERATIVE DIAGNOSIS:  Right shoulder AC joint arthritis and bursitis.  POSTOPERATIVE DIAGNOSIS:  Right shoulder AC joint arthritis and bursitis.  PROCEDURE:  Right shoulder arthroscopy with limited debridement of the superior labrum and arthroscopic subacromial decompression with acromioplasty and arthroscopic distal clavicle excision.  SURGEON:  Graylin Shiver. August Saucer, MD  ASSISTANT:  Karenann Cai, PA.  INDICATIONS:  This is a 52 year old patient with several month history of right shoulder pain relieved on 2 occasions with [facility] joint injection, but with short-term relief.  She presents now for operative management after explanation of risks and benefits.   Significant edema in the [facility] joint is present.  DESCRIPTION OF PROCEDURE:  The patient was brought to the operating room where general endotracheal anesthesia was induced.  Preoperative antibiotics administered.  Timeout was called.  The patient was placed in the beach chair position with head in  neutral position.  Right shoulder, arm and hand prescrubbed with alcohol and Betadine, allowed to air dry, prepped with DuraPrep solution and draped in sterile manner.  Hannah Lynch was used to seal the operative field and cover the axilla.  After calling  timeout posterior portal was created.  Anterior portal was then created under direct visualization after localizing the distal end of the clavicle.  That anterior portal was created just off the edge of the clavicle, which put slightly more medial than  usual into the glenohumeral joint.  Diagnostic arthroscopy was performed.  Glenohumeral joint surfaces were intact.  Partial thickness tearing less than 10% of the rotator cuff was present, which was debrided.  There was some early synovitis  within the  capsule, but there was no frozen shoulder because the patient did have excellent range of motion with examination under anesthesia.  Examination demonstrated external rotation to 70, isolated glenohumeral abduction to 100 and forward flexion to 175.   Anterior, inferior and posterior inferior glenohumeral ligaments were intact.  Biceps anchor had some fraying, but was stable.  This was debrided with the shaver.  Next, the scope was placed in the subacromial space.  The lateral portal was created.   Arthrocare wand was used to debride the bursa.  CA ligament was partially released.  Distal clavicle was visualized.  Acromioplasty performed using the bur.  Next, the distal 8-9 mm of the clavicle was resected, maintaining the superior and posterior  ligaments.  Confirmation of appropriate resection was made under direct visualization, both from the lateral portal and anterior portal.  Arm was taken through range of motion and there were no rotator cuff pathology on the bursal side.  The space was  irrigated and instruments were removed, and the portals were closed using 2 Vicryl and 3-0 nylon.  The patient tolerated the procedure well without immediate complications, transferred to the recovery room in stable condition.  Hannah Lynch's assistance was  required at all times for retraction, opening, closing, mobilization of tissue.   PUS D: 04/28/2023 5:52:02 pm T: 04/28/2023 8:16:00 pm  JOB: [id number]/ [id number]

## 2023-04-28 NOTE — H&P (Signed)
Hannah Lynch is an 52 y.o. female.   Chief Complaint: Right shoulder pain HPI:  Hannah Lynch is a 52 y.o. female who presents reporting right shoulder pain.  She had AC joint injection 10/29/2022 and she did get good relief for about 2 weeks from that.  This was 85 to 90% relief.  Patient had a second injection in February also with some relief but shorter duration.  The pain wakes her from sleep at night.  Describes constant pain.  She is right-hand dominant.  Does not like to take medication because of stomach issues.  Hard for her to sleep on the right-hand side.  MRI scan does show some AC joint arthritis with intact rotator cuff..    Past Medical History:  Diagnosis Date   Anxiety    Degenerative disc disease, lumbar    Degenerative disc disease, lumbar 2012   lumbar and cervical region   Depression    Endometriosis    Family history of adverse reaction to anesthesia    pt's mother has hx. of post-op N/V   Headache    History of hiatal hernia    Hypertension    Hypothyroidism    IBS (irritable bowel syndrome)    Nasal septal deviation 11/2017   Nasal turbinate hypertrophy 11/2017   PONV (postoperative nausea and vomiting)    Snoring 11/2017    Past Surgical History:  Procedure Laterality Date   ABDOMINAL HYSTERECTOMY  2005   partial - TAH, no cervix, has both ovaries; done for endometriosis   ANTERIOR CERVICAL DECOMP/DISCECTOMY FUSION  03/25/2007   C4-5   CERVICAL FUSION  2013   CHOLECYSTECTOMY N/A 03/26/2018   Procedure: LAPAROSCOPIC CHOLECYSTECTOMY;  Surgeon: Franky Macho, MD;  Location: [facility];  Service: General;  Laterality: N/A;   COLONOSCOPY     HIATAL HERNIA REPAIR     completed with gastric sleeve   KNEE ARTHROSCOPY Left    LAPAROSCOPIC GASTRIC SLEEVE RESECTION N/A 09/27/2019   Procedure: LAPAROSCOPIC GASTRIC SLEEVE RESECTION, HIATAL HERNIA REPARI ;Upper Endo, Eras Pathway;  Surgeon: Gaynelle Adu, MD;  Location: [facility];  Service: General;  Laterality: N/A;    LUMBAR LAMINECTOMY/DECOMPRESSION MICRODISCECTOMY  11/21/2004   L4-5   MAXIMUM ACCESS (MAS)POSTERIOR LUMBAR INTERBODY FUSION (PLIF) 1 LEVEL N/A 09/25/2014   Procedure: Lumbar four-five MAXIMUM ACCESS (MAS) POSTERIOR LUMBAR INTERBODY FUSION (PLIF) 1 LEVEL;  Surgeon: Maeola Harman, MD;  Location: [facility];  Service: Neurosurgery;  Laterality: N/A;   NASAL SEPTOPLASTY W/ TURBINOPLASTY N/A 12/07/2017   Procedure: NASAL SEPTOPLASTY WITH BILATERAL TURBINATE REDUCTION;  Surgeon: Christia Reading, MD;  Location: [facility];  Service: ENT;  Laterality: N/A;   ORIF ANKLE FRACTURE Right    TONSILLECTOMY  06/2018   UPPER GI ENDOSCOPY      Family History  Problem Relation Age of Onset   Hypertension Mother    Diabetes Mother    Heart failure Father    Hypertension Father    Diabetes Father    Heart failure Other    Breast cancer Paternal Grandmother    Social History:  reports that she has never smoked. She has never used smokeless tobacco. She reports current alcohol use. She reports that she does not use drugs.  Allergies: No Known Allergies  Medications Prior to Admission  Medication Sig Dispense Refill   diclofenac (VOLTAREN) 75 MG EC tablet Take 1 tablet (75 mg total) by mouth 2 (two) times daily. 60 tablet 1   docusate sodium (COLACE) 100 MG capsule  Take 100 mg by mouth daily.     levothyroxine (SYNTHROID) 25 MCG tablet Take 1 tablet (25 mcg total) by mouth daily. 90 tablet 2   methocarbamol (ROBAXIN) 500 MG tablet Take 1 tablet (500 mg total) by mouth 2 (two) times daily. 20 tablet 0   metoprolol succinate (TOPROL-XL) 25 MG 24 hr tablet Take 1 tablet (25 mg total) by mouth daily. 90 tablet 3   montelukast (SINGULAIR) 10 MG tablet Take 1 tablet (10 mg total) by mouth daily. 90 tablet 2   Multiple Vitamin (MULTIVITAMIN) tablet Take 1 tablet by mouth daily.     topiramate (TOPAMAX) 25 MG tablet Take 2 tablets (50 mg total) by mouth 2 (two) times daily. 120 tablet 0    traZODone (DESYREL) 150 MG tablet Take 1 tablet (150 mg total) by mouth at bedtime. 90 tablet 1    No results found for this or any previous visit (from the past 48 hour(s)). No results found.  Review of Systems  Musculoskeletal:  Positive for arthralgias.  All other systems reviewed and are negative.   Blood pressure 103/74, pulse 73, temperature 98.5 F (36.9 C), temperature source Oral, resp. rate 18, height 5\' 3"  (1.6 m), weight 73.5 kg, SpO2 98 %. Physical Exam Vitals reviewed.  HENT:     Head: Normocephalic.     Nose: Nose normal.     Mouth/Throat:     Mouth: Mucous membranes are moist.  Eyes:     Pupils: Pupils are equal, round, and reactive to light.  Cardiovascular:     Rate and Rhythm: Normal rate.     Pulses: Normal pulses.  Pulmonary:     Effort: Pulmonary effort is normal.  Abdominal:     General: Abdomen is flat.  Musculoskeletal:     Cervical back: Normal range of motion.  Skin:    General: Skin is warm.  Neurological:     General: No focal deficit present.     Mental Status: She is alert.  Psychiatric:        Mood and Affect: Mood normal.     Ortho exam demonstrates good rotator cuff strength and strength subscap muscle testing in both shoulders. Does have AC joint tenderness and some popping in the [facility] joint the passive range of motion on the right with pain with crossarm adduction. She has symmetric range of motion of 70/110/175 bilaterally with no apprehension. O'Brien's testing negative bilaterally. Speeds testing positive on the right. Cervical spine range of motion is full. No paresthesias C5-T1.  Assessment/Plan Impression symptomatic right AC joint arthritis with 2 AC joint injections as here.  Injections have given her good relief but the relief has been short-lived.  Patient also has components of bursitis.  MRI scan shows intact rotator cuff and no definite labral pathology.  Plan at this time is right shoulder arthroscopy with arthroscopic distal  clavicle excision and subacromial decompression.  The risk and benefits of the procedure discussed with the patient including not limited to infection nerve and vessel damage shoulder stiffness incomplete pain relief as well as prolonged recovery of 4 to 6 weeks.  Patient understands the risk and benefits and wishes to proceed.  All questions answered  Burnard Bunting, MD 04/28/2023, 10:28 AM

## 2023-04-28 NOTE — Anesthesia Procedure Notes (Signed)
Anesthesia Regional Block: Interscalene brachial plexus block   Pre-Anesthetic Checklist: , timeout performed,  Correct Patient, Correct Site, Correct Laterality,  Correct Procedure, Correct Position, site marked,  Risks and benefits discussed,  Surgical consent,  Pre-op evaluation,  At surgeon's request and post-op pain management  Laterality: Right  Prep: Dura Prep       Needles:  Injection technique: Single-shot  Needle Type: Echogenic Stimulator Needle     Needle Length: 5cm  Needle Gauge: 20     Additional Needles:   Procedures:,,,, ultrasound used (permanent image in chart),,    Narrative:  Start time: 04/28/2023 10:30 AM End time: 04/28/2023 10:35 AM Injection made incrementally with aspirations every 5 mL.  Performed by: Personally  Anesthesiologist: Atilano Median, DO  Additional Notes: Patient identified. Risks/Benefits/Options discussed with patient including but not limited to bleeding, infection, nerve damage, failed block, incomplete pain control. Patient expressed understanding and wished to proceed. All questions were answered. Sterile technique was used throughout the entire procedure. Please see nursing notes for vital signs. Aspirated in 5cc intervals with injection for negative confirmation. Patient was given instructions on fall risk and not to get out of bed. All questions and concerns addressed with instructions to call with any issues or inadequate analgesia.

## 2023-04-29 ENCOUNTER — Other Ambulatory Visit (HOSPITAL_COMMUNITY): Payer: Self-pay

## 2023-05-05 ENCOUNTER — Ambulatory Visit (HOSPITAL_BASED_OUTPATIENT_CLINIC_OR_DEPARTMENT_OTHER): Payer: 59 | Admitting: Family Medicine

## 2023-05-06 ENCOUNTER — Ambulatory Visit (INDEPENDENT_AMBULATORY_CARE_PROVIDER_SITE_OTHER): Payer: 59 | Admitting: Orthopedic Surgery

## 2023-05-06 ENCOUNTER — Encounter: Payer: Self-pay | Admitting: Orthopedic Surgery

## 2023-05-06 ENCOUNTER — Other Ambulatory Visit (HOSPITAL_BASED_OUTPATIENT_CLINIC_OR_DEPARTMENT_OTHER): Payer: Self-pay

## 2023-05-06 ENCOUNTER — Other Ambulatory Visit: Payer: Self-pay

## 2023-05-06 DIAGNOSIS — M25511 Pain in right shoulder: Secondary | ICD-10-CM

## 2023-05-06 MED ORDER — KETOROLAC TROMETHAMINE 10 MG PO TABS
10.0000 mg | ORAL_TABLET | Freq: Three times a day (TID) | ORAL | 0 refills | Status: AC
  Filled 2023-05-06: qty 15, 5d supply, fill #0

## 2023-05-06 NOTE — Progress Notes (Unsigned)
Post-Op Visit Note   Patient: Hannah Lynch           Date of Birth: Aug 05, 1971           MRN: [id number] Visit Date: 05/06/2023 PCP: Eartha Inch, MD   Assessment & Plan:  Chief Complaint:  Chief Complaint  Patient presents with   Right Shoulder - Routine Post Op    04/28/23 right shoulder scope/SAD/DCE   Visit Diagnoses:  1. Acute pain of right shoulder     Plan: Hannah Lynch is a 52 year old patient who is now about a week out right shoulder arthroscopy distal clavicle excision.  Patient states is still very painful.  Taking oxycodone and gabapentin.  That has not been very helpful.  She is in the sling.  On examination she has pretty reasonable passive range of motion.  Would like for her to start doing some home range of motion exercises and physical therapy here 2-3 times a week for 4 weeks.  She states that the interscalene block was not helpful at all for her pain relief and she woke up with significant pain in the shoulder.  This remained unnoticed and not accounted for by the recovery room nurse by Mauricia's report.  Plan is Toradol 10 mg 3 times a day for 5 days to try to tamp down her current pain level in 4-week return for clinical recheck.  Follow-Up Instructions: No follow-ups on file.   Orders:  Orders Placed This Encounter  Procedures   Ambulatory referral to Physical Therapy   Meds ordered this encounter  Medications   ketorolac (TORADOL) 10 MG tablet    Sig: Take 1 tablet (10 mg total) by mouth 3 (three) times daily for 5 days.    Dispense:  15 tablet    Refill:  0    Imaging: No results found.  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 08/30/2020   Pain in left knee 10/12/2019   Obesity (BMI 30-39.9) 09/27/2019   GERD (gastroesophageal reflux  disease) 09/27/2019   Essential hypertension 09/27/2019   Fatty liver 09/27/2019   Metabolic syndrome 09/27/2019   Pain in right ankle and joints of right foot 04/13/2019   Chronic cholecystitis    Primary insomnia 06/25/2017   Papanicolaou smear for cervical cancer screening 06/25/2017   Bilateral hearing loss 06/25/2017   Acute serous otitis media 06/25/2017   Lumbar stenosis with neurogenic claudication 09/25/2014   Past Medical History:  Diagnosis Date   Anxiety    Degenerative disc disease, lumbar    Degenerative disc disease, lumbar 2012   lumbar and cervical region   Depression    Endometriosis    Family history of adverse reaction to anesthesia    pt's mother has hx. of post-op N/V   Headache    History of hiatal hernia    Hypertension    Hypothyroidism    IBS (irritable bowel syndrome)    Nasal septal deviation 11/2017   Nasal turbinate hypertrophy 11/2017   PONV (postoperative nausea and vomiting)    Snoring 11/2017    Family History  Problem Relation Age of Onset   Hypertension Mother    Diabetes Mother    Heart failure Father    Hypertension Father    Diabetes Father  Heart failure Other    Breast cancer Paternal Grandmother     Past Surgical History:  Procedure Laterality Date   ABDOMINAL HYSTERECTOMY  2005   partial - TAH, no cervix, has both ovaries; done for endometriosis   ANTERIOR CERVICAL DECOMP/DISCECTOMY FUSION  03/25/2007   C4-5   CERVICAL FUSION  2013   CHOLECYSTECTOMY N/A 03/26/2018   Procedure: LAPAROSCOPIC CHOLECYSTECTOMY;  Surgeon: Franky Macho, MD;  Location: [facility];  Service: General;  Laterality: N/A;   COLONOSCOPY     HIATAL HERNIA REPAIR     completed with gastric sleeve   KNEE ARTHROSCOPY Left    LAPAROSCOPIC GASTRIC SLEEVE RESECTION N/A 09/27/2019   Procedure: LAPAROSCOPIC GASTRIC SLEEVE RESECTION, HIATAL HERNIA REPARI ;Upper Endo, Eras Pathway;  Surgeon: Gaynelle Adu, MD;  Location: [facility];  Service: General;  Laterality:  N/A;   LUMBAR LAMINECTOMY/DECOMPRESSION MICRODISCECTOMY  11/21/2004   L4-5   MAXIMUM ACCESS (MAS)POSTERIOR LUMBAR INTERBODY FUSION (PLIF) 1 LEVEL N/A 09/25/2014   Procedure: Lumbar four-five MAXIMUM ACCESS (MAS) POSTERIOR LUMBAR INTERBODY FUSION (PLIF) 1 LEVEL;  Surgeon: Maeola Harman, MD;  Location: [facility];  Service: Neurosurgery;  Laterality: N/A;   NASAL SEPTOPLASTY W/ TURBINOPLASTY N/A 12/07/2017   Procedure: NASAL SEPTOPLASTY WITH BILATERAL TURBINATE REDUCTION;  Surgeon: Christia Reading, MD;  Location: [facility];  Service: ENT;  Laterality: N/A;   ORIF ANKLE FRACTURE Right    TONSILLECTOMY  06/2018   UPPER GI ENDOSCOPY     Social History   Occupational History   Not on file  Tobacco Use   Smoking status: Never   Smokeless tobacco: Never  Vaping Use   Vaping Use: Never used  Substance and Sexual Activity   Alcohol use: Yes    Comment: once in awhile   Drug use: Never   Sexual activity: Yes    Birth control/protection: Surgical    Comment: hyst

## 2023-05-07 ENCOUNTER — Other Ambulatory Visit: Payer: Self-pay

## 2023-05-10 DIAGNOSIS — M19011 Primary osteoarthritis, right shoulder: Secondary | ICD-10-CM

## 2023-05-10 DIAGNOSIS — M7551 Bursitis of right shoulder: Secondary | ICD-10-CM

## 2023-05-12 ENCOUNTER — Telehealth: Payer: Self-pay | Admitting: Orthopedic Surgery

## 2023-05-12 DIAGNOSIS — G894 Chronic pain syndrome: Secondary | ICD-10-CM | POA: Diagnosis not present

## 2023-05-12 DIAGNOSIS — M47814 Spondylosis without myelopathy or radiculopathy, thoracic region: Secondary | ICD-10-CM | POA: Diagnosis not present

## 2023-05-12 DIAGNOSIS — M4804 Spinal stenosis, thoracic region: Secondary | ICD-10-CM | POA: Diagnosis not present

## 2023-05-12 DIAGNOSIS — M47894 Other spondylosis, thoracic region: Secondary | ICD-10-CM | POA: Diagnosis not present

## 2023-05-12 DIAGNOSIS — M47812 Spondylosis without myelopathy or radiculopathy, cervical region: Secondary | ICD-10-CM | POA: Diagnosis not present

## 2023-05-12 DIAGNOSIS — M5134 Other intervertebral disc degeneration, thoracic region: Secondary | ICD-10-CM | POA: Diagnosis not present

## 2023-05-12 DIAGNOSIS — M5030 Other cervical disc degeneration, unspecified cervical region: Secondary | ICD-10-CM | POA: Diagnosis not present

## 2023-05-15 ENCOUNTER — Other Ambulatory Visit: Payer: Self-pay

## 2023-05-15 ENCOUNTER — Other Ambulatory Visit (HOSPITAL_COMMUNITY): Payer: Self-pay

## 2023-05-15 MED ORDER — ORPHENADRINE CITRATE ER 100 MG PO TB12
100.0000 mg | ORAL_TABLET | Freq: Two times a day (BID) | ORAL | 0 refills | Status: DC | PRN
  Filled 2023-05-15: qty 60, 30d supply, fill #0

## 2023-05-16 ENCOUNTER — Other Ambulatory Visit (HOSPITAL_COMMUNITY): Payer: Self-pay

## 2023-05-18 ENCOUNTER — Ambulatory Visit (HOSPITAL_COMMUNITY): Payer: 59 | Admitting: Occupational Therapy

## 2023-05-18 ENCOUNTER — Other Ambulatory Visit: Payer: Self-pay

## 2023-05-18 NOTE — Therapy (Signed)
OUTPATIENT OCCUPATIONAL THERAPY ORTHO EVALUATION  Patient Name: Hannah Lynch MRN: [id number] DOB:[birth year], 52 y.o., female Today's Date: 05/19/2023  PCP: Antony Haste, MD REFERRING PROVIDER: Cammy Copa, MD   END OF SESSION:  OT End of Session - 05/19/23 0830     Visit Number 1    Number of Visits 12    Date for OT Re-Evaluation 07/03/23    Authorization Type [employer]    OT Start Time 0845    OT Stop Time [phone]    OT Time Calculation (min) 44 min    Activity Tolerance Patient tolerated treatment well;No increased pain;Patient limited by fatigue;Patient limited by pain    Behavior During Therapy [facility] for tasks assessed/performed             Past Medical History:  Diagnosis Date   Anxiety    Degenerative disc disease, lumbar    Degenerative disc disease, lumbar 2012   lumbar and cervical region   Depression    Endometriosis    Family history of adverse reaction to anesthesia    pt's mother has hx. of post-op N/V   Headache    History of hiatal hernia    Hypertension    Hypothyroidism    IBS (irritable bowel syndrome)    Nasal septal deviation 11/2017   Nasal turbinate hypertrophy 11/2017   PONV (postoperative nausea and vomiting)    Snoring 11/2017   Past Surgical History:  Procedure Laterality Date   ABDOMINAL HYSTERECTOMY  2005   partial - TAH, no cervix, has both ovaries; done for endometriosis   ANTERIOR CERVICAL DECOMP/DISCECTOMY FUSION  03/25/2007   C4-5   CERVICAL FUSION  2013   CHOLECYSTECTOMY N/A 03/26/2018   Procedure: LAPAROSCOPIC CHOLECYSTECTOMY;  Surgeon: Franky Macho, MD;  Location: [facility];  Service: General;  Laterality: N/A;   COLONOSCOPY     HIATAL HERNIA REPAIR     completed with gastric sleeve   KNEE ARTHROSCOPY Left    LAPAROSCOPIC GASTRIC SLEEVE RESECTION N/A 09/27/2019   Procedure: LAPAROSCOPIC GASTRIC SLEEVE RESECTION, HIATAL HERNIA REPARI ;Upper Endo, Eras Pathway;  Surgeon: Gaynelle Adu, MD;  Location: [facility];  Service: General;  Laterality: N/A;   LUMBAR LAMINECTOMY/DECOMPRESSION MICRODISCECTOMY  11/21/2004   L4-5   MAXIMUM ACCESS (MAS)POSTERIOR LUMBAR INTERBODY FUSION (PLIF) 1 LEVEL N/A 09/25/2014   Procedure: Lumbar four-five MAXIMUM ACCESS (MAS) POSTERIOR LUMBAR INTERBODY FUSION (PLIF) 1 LEVEL;  Surgeon: Maeola Harman, MD;  Location: [facility];  Service: Neurosurgery;  Laterality: N/A;   NASAL SEPTOPLASTY W/ TURBINOPLASTY N/A 12/07/2017   Procedure: NASAL SEPTOPLASTY WITH BILATERAL TURBINATE REDUCTION;  Surgeon: Christia Reading, MD;  Location: [facility];  Service: ENT;  Laterality: N/A;   ORIF ANKLE FRACTURE Right    TONSILLECTOMY  06/2018   UPPER GI ENDOSCOPY     Patient Active Problem List   Diagnosis Date Noted   Arthritis of right acromioclavicular joint 05/10/2023   Bursitis of right shoulder 05/10/2023   Pain in right wrist 08/20/2022   Impingement syndrome of right shoulder 08/20/2022   Trochanteric bursitis, left hip 08/30/2020   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 08/30/2020   Pain in left knee 10/12/2019   Obesity (BMI 30-39.9) 09/27/2019   GERD (gastroesophageal reflux disease) 09/27/2019   Essential hypertension 09/27/2019   Fatty liver 09/27/2019   Metabolic syndrome 09/27/2019   Pain in right ankle and joints of right foot 04/13/2019   Chronic cholecystitis    Primary insomnia 06/25/2017  Papanicolaou smear for cervical cancer screening 06/25/2017   Bilateral hearing loss 06/25/2017   Acute serous otitis media 06/25/2017   Lumbar stenosis with neurogenic claudication 09/25/2014    ONSET DATE: 04/28/23 DOS  REFERRING DIAG: M25.511 (ICD-10-CM) - Acute pain of right shoulder   THERAPY DIAG:  Muscle weakness (generalized)  Other lack of coordination  Acute pain of right shoulder  Stiffness of right shoulder, not elsewhere classified  Rationale for Evaluation and Treatment: Rehabilitation  SUBJECTIVE:   SUBJECTIVE  STATEMENT: She had onset of Rt shoulder pain in Nov 2023, after a fall due to low BP.  She since had right shoulder surgery on June 18 2 the distal clavicle/subacromial space and to "cleanout" the arthritis in that area.  She states her pain is starting to lessen, but pain is worst at night when shoulder retracts sleeping on back.  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    PERTINENT HISTORY:  Now 3 weeks post-op Rt sh SAD, DCE, scope.  Per MD notes: "[name] is a 52 year old patient who is now about a week out right shoulder arthroscopy distal clavicle excision. Patient states is still very painful. Taking oxycodone and gabapentin. That has not been very helpful. She is in the sling. On examination she has pretty reasonable passive range of motion. Would like for her to start doing some home range of motion exercises and physical therapy here 2-3 times a week for 4 weeks. She states that the interscalene block was not helpful at all for her pain relief and she woke up with significant pain in the shoulder. This remained unnoticed and not accounted for by the recovery room nurse by Marshayla's report. Plan is Toradol 10 mg 3 times a day for 5 days to try to tamp down her current pain level in 4-week return for clinical recheck."   PROM and AAROM ordered therapy 2-3x week for 4 weeks.  PRECAUTIONS: Shoulder and Other: past cervical fusion ; WEIGHT BEARING RESTRICTIONS: Yes <5# recommended for now  PAIN:  Are you having pain? Yes: NPRS scale: at rest 3-4/10 and at night up to 7-8/10 Pain location: anterior Rt shoulder, upper traps Pain description: soreness Aggravating factors: lifting Relieving factors: rest  FALLS: Has patient fallen in last 6 months? No  LIVING ENVIRONMENT: Lives with: lives with their family Lives in: House/apartment Has following equipment at home:  shoulder pulley  PLOF: Independent  PATIENT GOALS: to improve use of and function with Rt arm.   NEXT MD VISIT:  06/03/23    OBJECTIVE: (All objective assessments below are from initial evaluation on: 05/19/23 unless otherwise specified.)   HAND DOMINANCE: Right   ADLs: Overall ADLs: States decreased ability to grab, hold household objects, pain and inability to open containers, perform FMS tasks (manipulate fasteners on clothing), mild to moderate bathing problems as well.    FUNCTIONAL OUTCOME MEASURES: Eval: Quck DASH 73% impairment today  (Higher % Score  =  More Impairment)     UPPER EXTREMITY ROM     Shoulder to Wrist AROM Right eval  Shoulder flexion 131  Shoulder abduction 108  Shoulder extension 43  Shoulder internal rotation 36  Shoulder external rotation 69  Elbow flexion WNL  Elbow extension 0  (Blank rows = not tested)   Hand AROM Right eval  Full Fist Ability (or Gap to Distal Palmar Crease) full  Thumb Opposition  (Kapandji Scale)  normal  (Blank rows = not tested)   UPPER EXTREMITY MMT:  Eval:  NT at eval due to recent and still healing injuries. Will be tested when appropriate.   MMT Right TBD  Shoulder flexion   Shoulder abduction   Shoulder adduction   Shoulder extension   Shoulder internal rotation   Shoulder external rotation   Middle trapezius   Lower trapezius   Elbow flexion   Elbow extension   Forearm supination   Forearm pronation   Wrist flexion   Wrist extension   (Blank rows = not tested)  HAND FUNCTION: Eval: Mild observed weakness in affected Rt hand.  Grip strength Right: 69 lbs, Left: 72 lbs   COORDINATION: Eval: Mild observed coordination impairments with affected Rt arm. Box and Blocks Test: 57 Blocks today (77 is AVG)  SENSATION: Eval:  Light touch intact today  COGNITION: Eval: Overall cognitive status: WFL for evaluation today   OBSERVATIONS:   Eval: Orthoscopic surgical marks look well-healing, not overly tender, no significant resting pain postures.  Not in any sling or brace today.  TODAY'S TREATMENT:   Post-evaluation treatment:   For her safety/self-care she was advised to do no lifting pushing or pulling greater than 5 pounds now in the right arm but to be using her hand with light functional activities at a waist high height or lower.  She was advised to cause no significant pain to herself when performing home exercises.  She demonstrates supine active assistive exercises with a cane that her doctor had taught her and these appear to be going well.  OT adds the following exercises as upgraded stretches now including wall climbs for shoulder flexion and abduction as well as "scapular clock" exercises for light isometric shoulder blade squeezes.  She tolerates these well without any pain but feels appropriate light tensions and stretches.     Exercises - Seated Scapular Retraction  - 4-6 x daily - 5-10 reps - Wall Climb  - 4-5 x daily - 3-5 reps - 15 sec hold - Standing Shoulder Abduction Slides at Wall  - 4-5 x daily - 3-5 reps - 15 sec hold   PATIENT EDUCATION: Education details: See tx section above for details  Person educated: Patient Education method: Verbal Instruction, Teach back, Handouts  Education comprehension: States and demonstrates understanding, Additional Education required    HOME EXERCISE PROGRAM: Access Code: [email or web address] URL: [email or web address] Date: 05/19/2023 Prepared by: Fannie Knee   GOALS: Goals reviewed with patient? Yes   SHORT TERM GOALS: (STG required if POC>30 days) Target Date: 05/29/23  Pt will demo/state understanding of initial HEP to improve pain levels and prerequisite motion. Goal status: INITIAL   LONG TERM GOALS: Target Date: 06/19/23  Pt will improve functional ability by decreased impairment per Quick DASH assessment from 73% to 20% or better, for better quality of life. Goal status: INITIAL  2.  Pt will improve grip strength in Rt hand from 69lbs to at least 75lbs for functional use at home and in IADLs. Goal  status: INITIAL  3.  Pt will improve A/ROM in Rt sh abd from 108* to at least 135*, to have functional motion for tasks like reach and grasp.  Goal status: INITIAL  4.  Pt will improve strength in Rt sh flexion from 3-/5 MMT to at least 4+/5 MMT to have increased functional ability to carry out selfcare and higher-level homecare tasks with no difficulty. Goal status: INITIAL  5.  Pt will improve coordination skills in Rt arm, as seen by better score on box and blocks testing to at  least 65 blocks to have increased functional ability to carry out fine motor tasks (fasteners, etc.) and more complex, coordinated IADLs (meal prep, sports, etc.).  Goal status: INITIAL  6.  Pt will decrease pain at worst from 7-8/10 to 3/10 or better to have better sleep and occupational participation in daily roles. Goal status: INITIAL  ASSESSMENT:  CLINICAL IMPRESSION: Patient is a 52 y.o. female who was seen today for occupational therapy evaluation for Rt shoulder pain, post-op Rt sh scope, SAD, DCE.  She has lingering but slowly improving pain and other issues that limit her daily ability and she will benefit from outpatient occupational occupational therapy to increase her quality of life.   PERFORMANCE DEFICITS: in functional skills including ADLs, IADLs, coordination, proprioception, ROM, strength, pain, fascial restrictions, flexibility, Gross motor control, body mechanics, endurance, and UE functional use, cognitive skills including safety awareness, and psychosocial skills including coping strategies, habits, and routines and behaviors.   IMPAIRMENTS: are limiting patient from ADLs, IADLs, rest and sleep, work, leisure, and social participation.   COMORBIDITIES: may have co-morbidities  that affects occupational performance. Patient will benefit from skilled OT to address above impairments and improve overall function.  MODIFICATION OR ASSISTANCE TO COMPLETE EVALUATION: No modification of tasks or  assist necessary to complete an evaluation.  OT OCCUPATIONAL PROFILE AND HISTORY: Problem focused assessment: Including review of records relating to presenting problem.  CLINICAL DECISION MAKING: Moderate - several treatment options, min-mod task modification necessary  REHAB POTENTIAL: Excellent  EVALUATION COMPLEXITY: Low      PLAN:  OT FREQUENCY: 2x/week  OT DURATION: 4 weeks (through 06/19/23 as needed)   PLANNED INTERVENTIONS: self care/ADL training, therapeutic exercise, therapeutic activity, neuromuscular re-education, manual therapy, scar mobilization, passive range of motion, electrical stimulation, ultrasound, compression bandaging, moist heat, cryotherapy, patient/family education, energy conservation, coping strategies training, Re-evaluation, and Dry needling  RECOMMENDED OTHER SERVICES: none now   CONSULTED AND AGREED WITH PLAN OF CARE: Patient  PLAN FOR NEXT SESSION:   Around 4 weeks postop upgrade to isometric strengthening for the rotator cuff as well as biceps and triceps as tolerated, add in external rotation and internal rotation active assisted range of motion followed by gentle stretches as tolerated.  Discuss scar massages and other pain management techniques as needed   Fannie Knee, OT 05/19/2023, 12:01 PM

## 2023-05-19 ENCOUNTER — Other Ambulatory Visit: Payer: Self-pay

## 2023-05-19 ENCOUNTER — Ambulatory Visit: Payer: 59 | Admitting: Rehabilitative and Restorative Service Providers"

## 2023-05-19 ENCOUNTER — Encounter: Payer: Self-pay | Admitting: Rehabilitative and Restorative Service Providers"

## 2023-05-19 ENCOUNTER — Encounter: Payer: 59 | Admitting: Rehabilitative and Restorative Service Providers"

## 2023-05-19 DIAGNOSIS — M25511 Pain in right shoulder: Secondary | ICD-10-CM

## 2023-05-19 DIAGNOSIS — R278 Other lack of coordination: Secondary | ICD-10-CM

## 2023-05-19 DIAGNOSIS — M25611 Stiffness of right shoulder, not elsewhere classified: Secondary | ICD-10-CM | POA: Diagnosis not present

## 2023-05-19 DIAGNOSIS — M6281 Muscle weakness (generalized): Secondary | ICD-10-CM | POA: Diagnosis not present

## 2023-05-20 ENCOUNTER — Encounter (HOSPITAL_BASED_OUTPATIENT_CLINIC_OR_DEPARTMENT_OTHER): Payer: Self-pay | Admitting: Family Medicine

## 2023-05-20 ENCOUNTER — Other Ambulatory Visit (HOSPITAL_BASED_OUTPATIENT_CLINIC_OR_DEPARTMENT_OTHER): Payer: Self-pay

## 2023-05-20 ENCOUNTER — Ambulatory Visit (INDEPENDENT_AMBULATORY_CARE_PROVIDER_SITE_OTHER): Payer: 59 | Admitting: Family Medicine

## 2023-05-20 VITALS — BP 100/73 | HR 72 | Ht 63.0 in | Wt 167.7 lb

## 2023-05-20 DIAGNOSIS — E039 Hypothyroidism, unspecified: Secondary | ICD-10-CM | POA: Diagnosis not present

## 2023-05-20 DIAGNOSIS — M542 Cervicalgia: Secondary | ICD-10-CM | POA: Diagnosis not present

## 2023-05-20 DIAGNOSIS — I10 Essential (primary) hypertension: Secondary | ICD-10-CM | POA: Diagnosis not present

## 2023-05-20 DIAGNOSIS — H6501 Acute serous otitis media, right ear: Secondary | ICD-10-CM

## 2023-05-20 DIAGNOSIS — M549 Dorsalgia, unspecified: Secondary | ICD-10-CM | POA: Diagnosis not present

## 2023-05-20 DIAGNOSIS — Z7689 Persons encountering health services in other specified circumstances: Secondary | ICD-10-CM

## 2023-05-20 DIAGNOSIS — G8929 Other chronic pain: Secondary | ICD-10-CM

## 2023-05-20 DIAGNOSIS — E200 Idiopathic hypoparathyroidism: Secondary | ICD-10-CM | POA: Diagnosis not present

## 2023-05-20 DIAGNOSIS — M5030 Other cervical disc degeneration, unspecified cervical region: Secondary | ICD-10-CM

## 2023-05-20 MED ORDER — TRAZODONE HCL 150 MG PO TABS
150.0000 mg | ORAL_TABLET | Freq: Every day | ORAL | 1 refills | Status: DC
  Filled 2023-05-20: qty 90, 90d supply, fill #0

## 2023-05-20 MED ORDER — METOPROLOL SUCCINATE ER 25 MG PO TB24
25.0000 mg | ORAL_TABLET | Freq: Every day | ORAL | 3 refills | Status: AC
  Filled 2023-05-20: qty 90, 90d supply, fill #0

## 2023-05-20 MED ORDER — AMOXICILLIN-POT CLAVULANATE 875-125 MG PO TABS
1.0000 | ORAL_TABLET | Freq: Two times a day (BID) | ORAL | 0 refills | Status: AC
Start: 2023-05-20 — End: 2023-05-25
  Filled 2023-05-20: qty 10, 5d supply, fill #0

## 2023-05-20 MED ORDER — LEVOTHYROXINE SODIUM 25 MCG PO TABS
25.0000 ug | ORAL_TABLET | Freq: Every day | ORAL | 2 refills | Status: DC
  Filled 2023-05-20: qty 90, 90d supply, fill #0

## 2023-05-20 MED ORDER — TOPIRAMATE 25 MG PO TABS
50.0000 mg | ORAL_TABLET | Freq: Two times a day (BID) | ORAL | 0 refills | Status: DC
  Filled 2023-05-20: qty 120, 30d supply, fill #0

## 2023-05-20 NOTE — Progress Notes (Signed)
New Patient Office Visit  Subjective    Patient ID: Hannah Lynch, female    DOB: Dec 24, 1970  Age: 52 y.o. MRN: [id number]  CC:  Chief Complaint  Patient presents with   Ear Pain    Ongoing for a couple days   HPI Hannah Lynch is a 52 year-old female patient who presents to establish care. She has a history of degenerative disc disease, anxiety/depression, hypertension, hypothyroidism, IBS/Crohn's, and endometriosis.   Former PCP: Dr. Cyndia Bent at [facility]  Specialists include: ortho, spine specialist, & cards   Recently saw ortho for shoulder pain Spine specialist- extensive spine history (DDD) and multiple surgeries  3 lumber spine & 3 cervical spine rods placed in her back  Recent MRI showed 2 bulging discs in her mid thoracic region-- high pain tolerance and does not like to take pain medication. Recent change of muscle relaxer (robaxin to Norflex).   Cardiologist appt annually- extensive family history of cardiac problems Reports she has feeling of palpitations/heart racing and spells of dizziness Son had 5 Mis-- flat lined when he was 42 years old   Patient reports she has been having right ear pain for the past couple of days. Reports chronic tinnitus due to hearing loss (wears hearing aides). Reports waking up a few times at night from ear pain. She reports she was swimming with her grandchildren a couple days ago and might have got water in her ear. Denies tenderness to touch, ear drainage, sinus pain/pressure, nasal congestion, rhinorrhea, and seasonal allergies.   Outpatient Encounter Medications as of 05/20/2023  Medication Sig   amoxicillin-clavulanate (AUGMENTIN) 875-125 MG tablet Take 1 tablet by mouth 2 (two) times daily for 5 days.   diclofenac (VOLTAREN) 75 MG EC tablet Take 1 tablet (75 mg total) by mouth 2 (two) times daily.   docusate sodium (COLACE) 100 MG capsule Take 100 mg by mouth daily.   montelukast (SINGULAIR) 10 MG tablet Take 1 tablet (10 mg total) by  mouth daily.   Multiple Vitamin (MULTIVITAMIN) tablet Take 1 tablet by mouth daily.   orphenadrine (NORFLEX) 100 MG tablet Take 1 tablet (100 mg total) by mouth 2 (two) times daily as needed for muscle spasms.   [DISCONTINUED] levothyroxine (SYNTHROID) 25 MCG tablet Take 1 tablet (25 mcg total) by mouth daily.   [DISCONTINUED] metoprolol succinate (TOPROL-XL) 25 MG 24 hr tablet Take 1 tablet (25 mg total) by mouth daily.   [DISCONTINUED] topiramate (TOPAMAX) 25 MG tablet Take 2 tablets (50 mg total) by mouth 2 (two) times daily.   [DISCONTINUED] traZODone (DESYREL) 150 MG tablet Take 1 tablet (150 mg total) by mouth at bedtime.   levothyroxine (SYNTHROID) 25 MCG tablet Take 1 tablet (25 mcg total) by mouth daily.   metoprolol succinate (TOPROL-XL) 25 MG 24 hr tablet Take 1 tablet (25 mg total) by mouth daily.   topiramate (TOPAMAX) 25 MG tablet Take 2 tablets (50 mg total) by mouth 2 (two) times daily.   traZODone (DESYREL) 150 MG tablet Take 1 tablet (150 mg total) by mouth at bedtime.   [DISCONTINUED] gabapentin (NEURONTIN) 300 MG capsule Take 1 capsule (300 mg total) by mouth 3 (three) times daily.   [DISCONTINUED] methocarbamol (ROBAXIN) 500 MG tablet Take 1 tablet (500 mg total) by mouth 2 (two) times daily. (Patient not taking: Reported on 05/20/2023)   [DISCONTINUED] oxyCODONE (ROXICODONE) 5 MG immediate release tablet Take 1 tablet (5 mg total) by mouth every 4 (four) hours as needed for severe pain. (Patient not  taking: Reported on 05/19/2023)   [DISCONTINUED] pantoprazole (PROTONIX) 40 MG tablet Take one tablet (40 mg dose) by mouth daily.   Facility-Administered Encounter Medications as of 05/20/2023  Medication   lactated ringers infusion    Past Medical History:  Diagnosis Date   Anxiety    Degenerative disc disease, lumbar    Degenerative disc disease, lumbar 2012   lumbar and cervical region   Depression    Endometriosis    Family history of adverse reaction to anesthesia     pt's mother has hx. of post-op N/V   Headache    History of hiatal hernia    Hypertension    Hypothyroidism    IBS (irritable bowel syndrome)    Nasal septal deviation 11/2017   Nasal turbinate hypertrophy 11/2017   PONV (postoperative nausea and vomiting)    Snoring 11/2017    Past Surgical History:  Procedure Laterality Date   ABDOMINAL HYSTERECTOMY  2005   partial - TAH, no cervix, has both ovaries; done for endometriosis   ANTERIOR CERVICAL DECOMP/DISCECTOMY FUSION  03/25/2007   C4-5   CERVICAL FUSION  2013   CHOLECYSTECTOMY N/A 03/26/2018   Procedure: LAPAROSCOPIC CHOLECYSTECTOMY;  Surgeon: Franky Macho, MD;  Location: [facility];  Service: General;  Laterality: N/A;   COLONOSCOPY     HIATAL HERNIA REPAIR     completed with gastric sleeve   KNEE ARTHROSCOPY Left    LAPAROSCOPIC GASTRIC SLEEVE RESECTION N/A 09/27/2019   Procedure: LAPAROSCOPIC GASTRIC SLEEVE RESECTION, HIATAL HERNIA REPARI ;Upper Endo, Eras Pathway;  Surgeon: Gaynelle Adu, MD;  Location: [facility];  Service: General;  Laterality: N/A;   LUMBAR LAMINECTOMY/DECOMPRESSION MICRODISCECTOMY  11/21/2004   L4-5   MAXIMUM ACCESS (MAS)POSTERIOR LUMBAR INTERBODY FUSION (PLIF) 1 LEVEL N/A 09/25/2014   Procedure: Lumbar four-five MAXIMUM ACCESS (MAS) POSTERIOR LUMBAR INTERBODY FUSION (PLIF) 1 LEVEL;  Surgeon: Maeola Harman, MD;  Location: [facility];  Service: Neurosurgery;  Laterality: N/A;   NASAL SEPTOPLASTY W/ TURBINOPLASTY N/A 12/07/2017   Procedure: NASAL SEPTOPLASTY WITH BILATERAL TURBINATE REDUCTION;  Surgeon: Christia Reading, MD;  Location: [facility];  Service: ENT;  Laterality: N/A;   ORIF ANKLE FRACTURE Right    TONSILLECTOMY  06/2018   UPPER GI ENDOSCOPY      Family History  Problem Relation Age of Onset   Hypertension Mother    Diabetes Mother    Heart failure Father    Hypertension Father    Diabetes Father    Heart failure Other    Breast cancer Paternal Grandmother      Review of  Systems  Constitutional:  Negative for malaise/fatigue.  HENT:  Positive for ear pain (R), hearing loss (chronic) and tinnitus (chronic). Negative for congestion and sinus pain.   Eyes:  Negative for blurred vision, double vision, pain and discharge.  Respiratory:  Negative for cough and shortness of breath.   Cardiovascular:  Negative for chest pain and palpitations.  Gastrointestinal:  Negative for abdominal pain, nausea and vomiting.  Musculoskeletal:  Negative for myalgias.  Neurological:  Negative for dizziness, weakness and headaches.  Psychiatric/Behavioral:  Negative for depression and suicidal ideas. The patient is not nervous/anxious.         Objective    BP 100/73   Pulse 72   Ht 5\' 3"  (1.6 m)   Wt 167 lb 11.2 oz (76.1 kg)   SpO2 100%   BMI 29.71 kg/m   Physical Exam Constitutional:      Appearance: Normal appearance.  HENT:  Right Ear: External ear normal.     Left Ear: Tympanic membrane, ear canal and external ear normal.  Cardiovascular:     Rate and Rhythm: Normal rate and regular rhythm.     Pulses: Normal pulses.     Heart sounds: Normal heart sounds.  Pulmonary:     Effort: Pulmonary effort is normal.     Breath sounds: Normal breath sounds.  Neurological:     Mental Status: She is alert.  Psychiatric:        Mood and Affect: Mood normal.        Behavior: Behavior normal.        Thought Content: Thought content normal.        Judgment: Judgment normal.     Assessment & Plan:   1. Encounter to establish care Patient presents today to establish care due to former primary care provider leaving current position. Review of chart- past medical history of degenerative disc disease (cervical and lumbar regions), anxiety/depression, hypertension, hypothyroidism, IBS/Crohn's, and endometriosis. She has concerns today regarding right ear pain.   2. Non-recurrent acute serous otitis media of right ear Patient reports she has been experiencing right ear  pain for the past couple of days. Denies ear drainage, headache, eye pain/discharge, sinus pressure/pain, fever/chills, and body aches. Bilateral ears: pinna, tragus, and ear canal are non-tender and without swelling. Both ear canals are clear without cerumen impaction and discharge. The left tympanic membranes is translucent, pearly grey, and shiny with no bulging or retraction. Normal in appearance with good cone-shaped light reflection of light and smooth consistency. The right TM is bulging, with erythema present. Counseled patient regarding acute serous otitis media and prescribed antibiotic regimen. Advised patient to return to office if symptoms persist or worsen.  - amoxicillin-clavulanate (AUGMENTIN) 875-125 MG tablet; Take 1 tablet by mouth 2 (two) times daily for 5 days.  Dispense: 10 tablet; Refill: 0  3. Hypothyroidism, unspecified type Patient diagnosed with hypothyroidism and currently taking levothyroxine . TSH not checked recently. Will obtain thyroid function labs today and make adjustments to medication as indicated.  - TSH Rfx on Abnormal to Free T4  4. Essential hypertension Patient presents today with normal blood pressure. Denies chest pain, shortness of breath, lower extremity edema, vision changes, headaches. Cardiovascular exam with heart regular rate and rhythm. Normal heart sounds, no murmurs present. No lower extremity edema present. Lungs clear to auscultation bilaterally. Patient is currently taking metoprolol 25mg  daily due to history of sinus tachycardia. Refills provided today. Patient reports being on additional antihypertensive pharmacotherapy but was taken off due to orthostatic hypotension.   5. DDD (degenerative disc disease), cervical Patient has a history of DDD in her cervical, thoracic, and lumbar spine. Currently followed by Spine Specialist at [facility]. Review of note from 05/12/2023- patient has a history of chronic neck & low back pain with worsening in her  chronic pain. She is s/p C3-6 ACDF and s/p L4-S1 PLIF by Dr. Petra Kuba. New MRI findings indicate moderate DDD  with disc bulging from T9-10, T10-11, and T11-T12. No narrowing of spinal canal. Patient reports they have changed her muscle relaxer medications to help with chronic pain management.   6. Chronic neck and back pain Followed by Spine Specialist. See #5.    No follow-ups on file.   Alyson Reedy, FNP

## 2023-05-21 ENCOUNTER — Other Ambulatory Visit: Payer: Self-pay | Admitting: Oncology

## 2023-05-21 ENCOUNTER — Encounter (HOSPITAL_BASED_OUTPATIENT_CLINIC_OR_DEPARTMENT_OTHER): Payer: Self-pay | Admitting: Family Medicine

## 2023-05-21 ENCOUNTER — Encounter: Payer: 59 | Admitting: Rehabilitative and Restorative Service Providers"

## 2023-05-21 DIAGNOSIS — Z006 Encounter for examination for normal comparison and control in clinical research program: Secondary | ICD-10-CM

## 2023-05-21 LAB — TSH RFX ON ABNORMAL TO FREE T4: TSH: 4.4 u[IU]/mL (ref 0.450–4.500)

## 2023-05-25 NOTE — Therapy (Signed)
OUTPATIENT OCCUPATIONAL THERAPY ORTHO TREATMENT NOTE  Patient Name: Hannah Lynch MRN: [id number] DOB:10-13-1971, 52 y.o., female Today's Date: 05/26/2023  PCP: Antony Haste, MD REFERRING PROVIDER: Cammy Copa, MD   END OF SESSION:  OT End of Session - 05/26/23 0839     Visit Number 2    Number of Visits 12    Date for OT Re-Evaluation 07/03/23    Authorization Type [employer]    OT Start Time [phone]    OT Stop Time [phone]    OT Time Calculation (min) 43 min    Activity Tolerance Patient tolerated treatment well;No increased pain;Patient limited by fatigue;Patient limited by pain    Behavior During Therapy [facility] for tasks assessed/performed              Past Medical History:  Diagnosis Date   Anxiety    Degenerative disc disease, lumbar    Degenerative disc disease, lumbar 2012   lumbar and cervical region   Depression    Endometriosis    Family history of adverse reaction to anesthesia    pt's mother has hx. of post-op N/V   Headache    History of hiatal hernia    Hypertension    Hypothyroidism    IBS (irritable bowel syndrome)    Nasal septal deviation 11/2017   Nasal turbinate hypertrophy 11/2017   PONV (postoperative nausea and vomiting)    Snoring 11/2017   Past Surgical History:  Procedure Laterality Date   ABDOMINAL HYSTERECTOMY  2005   partial - TAH, no cervix, has both ovaries; done for endometriosis   ANTERIOR CERVICAL DECOMP/DISCECTOMY FUSION  03/25/2007   C4-5   CERVICAL FUSION  2013   CHOLECYSTECTOMY N/A 03/26/2018   Procedure: LAPAROSCOPIC CHOLECYSTECTOMY;  Surgeon: Franky Macho, MD;  Location: [facility];  Service: General;  Laterality: N/A;   COLONOSCOPY     HIATAL HERNIA REPAIR     completed with gastric sleeve   KNEE ARTHROSCOPY Left    LAPAROSCOPIC GASTRIC SLEEVE RESECTION N/A 09/27/2019   Procedure: LAPAROSCOPIC GASTRIC SLEEVE RESECTION, HIATAL HERNIA REPARI ;Upper Endo, Eras Pathway;  Surgeon: Gaynelle Adu, MD;  Location:  [facility];  Service: General;  Laterality: N/A;   LUMBAR LAMINECTOMY/DECOMPRESSION MICRODISCECTOMY  11/21/2004   L4-5   MAXIMUM ACCESS (MAS)POSTERIOR LUMBAR INTERBODY FUSION (PLIF) 1 LEVEL N/A 09/25/2014   Procedure: Lumbar four-five MAXIMUM ACCESS (MAS) POSTERIOR LUMBAR INTERBODY FUSION (PLIF) 1 LEVEL;  Surgeon: Maeola Harman, MD;  Location: [facility];  Service: Neurosurgery;  Laterality: N/A;   NASAL SEPTOPLASTY W/ TURBINOPLASTY N/A 12/07/2017   Procedure: NASAL SEPTOPLASTY WITH BILATERAL TURBINATE REDUCTION;  Surgeon: Christia Reading, MD;  Location: [facility];  Service: ENT;  Laterality: N/A;   ORIF ANKLE FRACTURE Right    TONSILLECTOMY  06/2018   UPPER GI ENDOSCOPY     Patient Active Problem List   Diagnosis Date Noted   DDD (degenerative disc disease), cervical 05/20/2023   Chronic neck and back pain 05/20/2023   Arthritis of right acromioclavicular joint 05/10/2023   Bursitis of right shoulder 05/10/2023   Pain in right wrist 08/20/2022   Impingement syndrome of right shoulder 08/20/2022   Trochanteric bursitis, left hip 08/30/2020   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 08/30/2020   Pain in left knee 10/12/2019   Obesity (BMI 30-39.9) 09/27/2019   GERD (gastroesophageal reflux disease) 09/27/2019   Essential hypertension 09/27/2019   Fatty liver 09/27/2019   Metabolic syndrome 09/27/2019   Pain in  right ankle and joints of right foot 04/13/2019   Chronic cholecystitis    Primary insomnia 06/25/2017   Papanicolaou smear for cervical cancer screening 06/25/2017   Bilateral hearing loss 06/25/2017   Acute serous otitis media 06/25/2017   Lumbar stenosis with neurogenic claudication 09/25/2014    ONSET DATE: 04/28/23 DOS  REFERRING DIAG: M25.511 (ICD-10-CM) - Acute pain of right shoulder   THERAPY DIAG:  Muscle weakness (generalized)  Other lack of coordination  Stiffness of right shoulder, not elsewhere classified  Acute pain of right  shoulder  Rationale for Evaluation and Treatment: Rehabilitation  PERTINENT HISTORY:  Per MD notes: "Hannah Lynch is a 52 year old patient who is now about a week out right shoulder arthroscopy distal clavicle excision. Patient states is still very painful. Taking oxycodone and gabapentin. That has not been very helpful. She is in the sling. On examination she has pretty reasonable passive range of motion. Would like for her to start doing some home range of motion exercises and physical therapy here 2-3 times a week for 4 weeks. She states that the interscalene block was not helpful at all for her pain relief and she woke up with significant pain in the shoulder. This remained unnoticed and not accounted for by the recovery room nurse by Haydan's report. Plan is Toradol 10 mg 3 times a day for 5 days to try to tamp down her current pain level in 4-week return for clinical recheck."  She had onset of Rt shoulder pain in Nov 2023, after a fall due to low BP.  She since had right shoulder surgery on June 18 2 the distal clavicle/subacromial space and to "cleanout" the arthritis in that area.  She states her pain is starting to lessen, but pain is worst at night when shoulder retracts sleeping on back.  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   PROM and AAROM ordered therapy 2-3x week for 4 weeks.  PRECAUTIONS: Shoulder and Other: past cervical fusion; WEIGHT BEARING RESTRICTIONS: Yes <5# recommended for now   SUBJECTIVE:   SUBJECTIVE STATEMENT: Now 4 weeks post-op Rt sh SAD, DCE, scope.  She missed last session. Returns and states she thinks she over did it a bit, because her lat humeral area is sore today.    PAIN:  Are you having pain?  Yes: NPRS scale: at rest 3/10, and no significant night pain Pain location: anterior Rt shoulder, upper traps Pain description: soreness Aggravating factors: lifting Relieving factors: rest   PATIENT GOALS: to improve use of and function with Rt arm.   NEXT MD VISIT:  06/03/23    OBJECTIVE: (All objective assessments below are from initial evaluation on: 05/19/23 unless otherwise specified.)   HAND DOMINANCE: Right   ADLs: Overall ADLs: States decreased ability to grab, hold household objects, pain and inability to open containers, perform FMS tasks (manipulate fasteners on clothing), mild to moderate bathing problems as well.    FUNCTIONAL OUTCOME MEASURES: Eval: Quck DASH 73% impairment today  (Higher % Score  =  More Impairment)     UPPER EXTREMITY ROM     Shoulder to Wrist AROM Right eval Rt 05/26/23  Shoulder flexion 131 135  Shoulder abduction 108 146  Shoulder extension 43   Shoulder internal rotation 36   Shoulder external rotation 69   Elbow flexion WNL   Elbow extension 0   (Blank rows = not tested)   Hand AROM Right eval  Full Fist Ability (or Gap to Distal Palmar Crease) full  Thumb Opposition  (Kapandji Scale)  normal  (Blank rows = not tested)   UPPER EXTREMITY MMT:    Eval:  NT at eval due to recent and still healing injuries. Will be tested when appropriate.   MMT Right TBD  Shoulder flexion   Shoulder abduction   Shoulder adduction   Shoulder extension   Shoulder internal rotation   Shoulder external rotation   Middle trapezius   Lower trapezius   Elbow flexion   Elbow extension   Forearm supination   Forearm pronation   Wrist flexion   Wrist extension   (Blank rows = not tested)  HAND FUNCTION: Eval: Mild observed weakness in affected Rt hand.  Grip strength Right: 69 lbs, Left: 72 lbs   COORDINATION: Eval: Mild observed coordination impairments with affected Rt arm. Box and Blocks Test: 57 Blocks today (77 is AVG)  OBSERVATIONS:   Eval: Orthoscopic surgical marks look well-healing, not overly tender, no significant resting pain postures.  Not in any sling or brace today.  TODAY'S TREATMENT:  05/26/23: OT uses moist heat modality over her right shoulder while reviewing newer exercises from last  session and educating on 2 new stretches-the shoulder internal rotation and external rotation stretches bolded below.  She states moist heat feels good and has no ill effects afterwards.  She then performs her scapular exercises in 4 planes followed by supine exercises and AAROM.  She is recommended to start propping up to a 45 degree angle and also do actively without the aid of a cane if tolerated.  She then turns to her side to perform sleeper stretches gently with cues and supervision for safety and support.  Next, she stands to perform doorway stretches followed by wall climbs which she tolerates well.  She also is edu on shoulder RTC isometrics and performs in 6 plane  as listed below. She leaves in no significant pain, states feeling better at the end of session. She is reminded to do no "weight lifting" or lift anything heavy with Rt arm yet.   Exercises - Seated Scapular Retraction  - 4-6 x daily - 5-10 reps - lying cane exercises   - 4-5 x daily - 10-15 reps - Sleeper Stretch  - 3-4 x daily - 3-5 reps - 15 hold - Doorway Stretches  - 4-5 x daily - 3 reps - 15 sec hold - Wall Climb  - 4-5 x daily - 3-5 reps - 15 sec hold - Standing Shoulder Abduction Slides at Wall  - 4-5 x daily - 3-5 reps - 15 sec hold - Standing Isometric Shoulder Flexion with Doorway - Arm Bent  - 2-3 x daily - 1 sets - 5-10 reps - 3-5 sec hold - Standing Isometric Shoulder Extension with Doorway - Arm Bent  - 2-3 x daily - 1 sets - 5-10 reps - 3-5 sec hold - Standing Isometric Shoulder Abduction with Doorway - Arm Bent  - 2-3 x daily - 1 sets - 5-10 reps - 3-5 sec hold - Isometric Shoulder Adduction  - 2-3 x daily - 1 sets - 5-10 reps - 3-5 sec hold - Standing Isometric Shoulder Internal Rotation at Doorway  - 2-3 x daily - 1 sets - 5-10 reps - 3-5 sec hold - Standing Isometric Shoulder External Rotation with Doorway  - 2-3 x daily - 1 sets - 5-10 reps - 3-5 sec holding Shoulder Abduction Slides at Wall  - 4-5 x daily -  3-5 reps - 15 sec hold  PATIENT EDUCATION: Education details: See tx section above for details  Person educated: Patient Education method: Verbal Instruction, Teach back, Handouts  Education comprehension: States and demonstrates understanding, Additional Education required    HOME EXERCISE PROGRAM: Access Code: [email or web address] URL: [email or web address] Date: 05/19/2023 Prepared by: Fannie Knee   GOALS: Goals reviewed with patient? Yes   SHORT TERM GOALS: (STG required if POC>30 days) Target Date: 05/29/23  Pt will demo/state understanding of initial HEP to improve pain levels and prerequisite motion. Goal status: INITIAL   LONG TERM GOALS: Target Date: 06/19/23  Pt will improve functional ability by decreased impairment per Quick DASH assessment from 73% to 20% or better, for better quality of life. Goal status: INITIAL  2.  Pt will improve grip strength in Rt hand from 69lbs to at least 75lbs for functional use at home and in IADLs. Goal status: INITIAL  3.  Pt will improve A/ROM in Rt sh abd from 108* to at least 135*, to have functional motion for tasks like reach and grasp.  Goal status: INITIAL  4.  Pt will improve strength in Rt sh flexion from 3-/5 MMT to at least 4+/5 MMT to have increased functional ability to carry out selfcare and higher-level homecare tasks with no difficulty. Goal status: INITIAL  5.  Pt will improve coordination skills in Rt arm, as seen by better score on box and blocks testing to at least 65 blocks to have increased functional ability to carry out fine motor tasks (fasteners, etc.) and more complex, coordinated IADLs (meal prep, sports, etc.).  Goal status: INITIAL  6.  Pt will decrease pain at worst from 7-8/10 to 3/10 or better to have better sleep and occupational participation in daily roles. Goal status: INITIAL  ASSESSMENT:  CLINICAL IMPRESSION: 05/26/23:  Tolerating new stretches well, pain staying low, motion  improving. Carry on.    PLAN:  OT FREQUENCY: 2x/week  OT DURATION: 4 weeks (through 06/19/23 as needed)   PLANNED INTERVENTIONS: self care/ADL training, therapeutic exercise, therapeutic activity, neuromuscular re-education, manual therapy, scar mobilization, passive range of motion, electrical stimulation, ultrasound, compression bandaging, moist heat, cryotherapy, patient/family education, energy conservation, coping strategies training, Re-evaluation, and Dry needling  CONSULTED AND AGREED WITH PLAN OF CARE: Patient  PLAN FOR NEXT SESSION:   Review new stretches, new strength, upgrade as tolerated- manage pain & tightness, consider manual therapy for lateral shoulder  Fannie Knee, OTR/L 05/26/2023, 9:27 AM

## 2023-05-26 ENCOUNTER — Ambulatory Visit: Payer: 59 | Admitting: Rehabilitative and Restorative Service Providers"

## 2023-05-26 ENCOUNTER — Encounter: Payer: Self-pay | Admitting: Rehabilitative and Restorative Service Providers"

## 2023-05-26 DIAGNOSIS — R278 Other lack of coordination: Secondary | ICD-10-CM

## 2023-05-26 DIAGNOSIS — M25611 Stiffness of right shoulder, not elsewhere classified: Secondary | ICD-10-CM

## 2023-05-26 DIAGNOSIS — M6281 Muscle weakness (generalized): Secondary | ICD-10-CM

## 2023-05-26 DIAGNOSIS — M25511 Pain in right shoulder: Secondary | ICD-10-CM | POA: Diagnosis not present

## 2023-05-28 ENCOUNTER — Encounter: Payer: 59 | Admitting: Rehabilitative and Restorative Service Providers"

## 2023-05-28 DIAGNOSIS — R0789 Other chest pain: Secondary | ICD-10-CM | POA: Diagnosis not present

## 2023-05-28 DIAGNOSIS — R002 Palpitations: Secondary | ICD-10-CM | POA: Diagnosis not present

## 2023-05-28 DIAGNOSIS — I4711 Inappropriate sinus tachycardia, so stated: Secondary | ICD-10-CM | POA: Diagnosis not present

## 2023-05-28 NOTE — Therapy (Signed)
OUTPATIENT OCCUPATIONAL THERAPY ORTHO TREATMENT NOTE  Patient Name: Juleen Sorrels MRN: [id number] DOB:28-Oct-1971, 52 y.o., female Today's Date: 06/02/2023  PCP: Antony Haste, MD REFERRING PROVIDER: Cammy Copa, MD   END OF SESSION:  OT End of Session - 06/02/23 0846     Visit Number 3    Number of Visits 12    Date for OT Re-Evaluation 07/03/23    Authorization Type [employer]    OT Start Time [phone]    OT Stop Time [phone]    OT Time Calculation (min) 41 min    Activity Tolerance Patient tolerated treatment well;No increased pain;Patient limited by fatigue;Patient limited by pain    Behavior During Therapy [facility] for tasks assessed/performed               Past Medical History:  Diagnosis Date   Anxiety    Degenerative disc disease, lumbar    Degenerative disc disease, lumbar 2012   lumbar and cervical region   Depression    Endometriosis    Family history of adverse reaction to anesthesia    pt's mother has hx. of post-op N/V   Headache    History of hiatal hernia    Hypertension    Hypothyroidism    IBS (irritable bowel syndrome)    Nasal septal deviation 11/2017   Nasal turbinate hypertrophy 11/2017   PONV (postoperative nausea and vomiting)    Snoring 11/2017   Past Surgical History:  Procedure Laterality Date   ABDOMINAL HYSTERECTOMY  2005   partial - TAH, no cervix, has both ovaries; done for endometriosis   ANTERIOR CERVICAL DECOMP/DISCECTOMY FUSION  03/25/2007   C4-5   CERVICAL FUSION  2013   CHOLECYSTECTOMY N/A 03/26/2018   Procedure: LAPAROSCOPIC CHOLECYSTECTOMY;  Surgeon: Franky Macho, MD;  Location: [facility];  Service: General;  Laterality: N/A;   COLONOSCOPY     HIATAL HERNIA REPAIR     completed with gastric sleeve   KNEE ARTHROSCOPY Left    LAPAROSCOPIC GASTRIC SLEEVE RESECTION N/A 09/27/2019   Procedure: LAPAROSCOPIC GASTRIC SLEEVE RESECTION, HIATAL HERNIA REPARI ;Upper Endo, Eras Pathway;  Surgeon: Gaynelle Adu, MD;   Location: [facility];  Service: General;  Laterality: N/A;   LUMBAR LAMINECTOMY/DECOMPRESSION MICRODISCECTOMY  11/21/2004   L4-5   MAXIMUM ACCESS (MAS)POSTERIOR LUMBAR INTERBODY FUSION (PLIF) 1 LEVEL N/A 09/25/2014   Procedure: Lumbar four-five MAXIMUM ACCESS (MAS) POSTERIOR LUMBAR INTERBODY FUSION (PLIF) 1 LEVEL;  Surgeon: Maeola Harman, MD;  Location: [facility];  Service: Neurosurgery;  Laterality: N/A;   NASAL SEPTOPLASTY W/ TURBINOPLASTY N/A 12/07/2017   Procedure: NASAL SEPTOPLASTY WITH BILATERAL TURBINATE REDUCTION;  Surgeon: Christia Reading, MD;  Location: [facility];  Service: ENT;  Laterality: N/A;   ORIF ANKLE FRACTURE Right    TONSILLECTOMY  06/2018   UPPER GI ENDOSCOPY     Patient Active Problem List   Diagnosis Date Noted   DDD (degenerative disc disease), cervical 05/20/2023   Chronic neck and back pain 05/20/2023   Arthritis of right acromioclavicular joint 05/10/2023   Bursitis of right shoulder 05/10/2023   Pain in right wrist 08/20/2022   Impingement syndrome of right shoulder 08/20/2022   Trochanteric bursitis, left hip 08/30/2020   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 08/30/2020   Pain in left knee 10/12/2019   Obesity (BMI 30-39.9) 09/27/2019   GERD (gastroesophageal reflux disease) 09/27/2019   Essential hypertension 09/27/2019   Fatty liver 09/27/2019   Metabolic syndrome 09/27/2019   Pain  in right ankle and joints of right foot 04/13/2019   Chronic cholecystitis    Primary insomnia 06/25/2017   Papanicolaou smear for cervical cancer screening 06/25/2017   Bilateral hearing loss 06/25/2017   Acute serous otitis media 06/25/2017   Lumbar stenosis with neurogenic claudication 09/25/2014    ONSET DATE: 04/28/23 DOS  REFERRING DIAG: M25.511 (ICD-10-CM) - Acute pain of right shoulder   THERAPY DIAG:  Muscle weakness (generalized)  Other lack of coordination  Acute pain of right shoulder  Stiffness of right shoulder, not  elsewhere classified  Rationale for Evaluation and Treatment: Rehabilitation  PERTINENT HISTORY:  Per MD notes: "Yanin is a 52 year old patient who is now about a week out right shoulder arthroscopy distal clavicle excision. Patient states is still very painful. Taking oxycodone and gabapentin. That has not been very helpful. She is in the sling. On examination she has pretty reasonable passive range of motion. Would like for her to start doing some home range of motion exercises and physical therapy here 2-3 times a week for 4 weeks. She states that the interscalene block was not helpful at all for her pain relief and she woke up with significant pain in the shoulder. This remained unnoticed and not accounted for by the recovery room nurse by Andreal's report. Plan is Toradol 10 mg 3 times a day for 5 days to try to tamp down her current pain level in 4-week return for clinical recheck."  She had onset of Rt shoulder pain in Nov 2023, after a fall due to low BP.  She since had right shoulder surgery on June 18 2 the distal clavicle/subacromial space and to "cleanout" the arthritis in that area.  She states her pain is starting to lessen, but pain is worst at night when shoulder retracts sleeping on back.  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   PROM and AAROM ordered therapy 2-3x week for 4 weeks.  PRECAUTIONS: Shoulder and Other: past cervical fusion; WEIGHT BEARING RESTRICTIONS: Yes <5# recommended for now   SUBJECTIVE:   SUBJECTIVE STATEMENT: Now 5 weeks post-op Rt sh SAD, DCE, scope.  She missed last session, again, has only been attending 1x week. She returns and states now having a bit more pain and feeling some muscle spasms, etc., around the Rt shoulder (upper traps, lateral shoulder and delt).    PAIN:  Are you having pain?   Yes: NPRS scale: at rest 3/10, and no significant night pain Pain location: anterior Rt shoulder, upper traps Pain description: soreness Aggravating factors:  lifting Relieving factors: rest   PATIENT GOALS: to improve use of and function with Rt arm.   NEXT MD VISIT: 06/03/23    OBJECTIVE: (All objective assessments below are from initial evaluation on: 05/19/23 unless otherwise specified.)   HAND DOMINANCE: Right   ADLs: Overall ADLs: States decreased ability to grab, hold household objects, pain and inability to open containers, perform FMS tasks (manipulate fasteners on clothing), mild to moderate bathing problems as well.    FUNCTIONAL OUTCOME MEASURES: Eval: Quck DASH 73% impairment today  (Higher % Score  =  More Impairment)     UPPER EXTREMITY ROM     Shoulder to Wrist AROM Right eval Rt 05/26/23 Rt 06/02/23  Shoulder flexion 131 135 143  Shoulder abduction 108 146 146  Shoulder extension 43  44  Shoulder internal rotation 36  37  Shoulder external rotation 69  80  Elbow flexion WNL    Elbow extension 0    (  Blank rows = not tested)   Hand AROM Right eval  Full Fist Ability (or Gap to Distal Palmar Crease) full  Thumb Opposition  (Kapandji Scale)  normal  (Blank rows = not tested)   UPPER EXTREMITY MMT:     MMT Right TBD  Shoulder flexion   Shoulder abduction   Shoulder adduction   Shoulder extension   Shoulder internal rotation   Shoulder external rotation   Middle trapezius   Lower trapezius   Elbow flexion   Elbow extension   Forearm supination   Forearm pronation   Wrist flexion   Wrist extension   (Blank rows = not tested)  HAND FUNCTION: Eval: Mild observed weakness in affected Rt hand.  Grip strength Right: 69 lbs, Left: 72 lbs   COORDINATION: Eval: Mild observed coordination impairments with affected Rt arm. Box and Blocks Test: 57 Blocks today (77 is AVG)  OBSERVATIONS:   Eval: Orthoscopic surgical marks look well-healing, not overly tender, no significant resting pain postures.  Not in any sling or brace today.  TODAY'S TREATMENT:  06/02/23: OT has pt lie supine while OT performs  manual therapy IASTM to lateral shoulder, she has several large muscle knots which do seem to improve. OT also uses percussion therapy to tight UT spasm. OT does manual stretches with light joint oscillations in Rt sh IR, ER, Flex, and tricep stretches.  Due to recent increased pain and tightness around the right shoulder, OT educates to withhold isometric strengthening in abduction and external rotation, and rather try scapular protraction supine as well as prone scapular retraction (rowing).  She performs both of those exercises today with no pain or problems 10 times each with 3-second muscle squeezes.  Lastly she reviews her other home exercises and performs doorway stretches carefully and is educated on new seated external rotation stretch which she states she can feel well with no added pain.  She states feeling "better" at the end of the session and OT encourages her to strive for this feeling.   05/26/23: OT uses moist heat modality over her right shoulder while reviewing newer exercises from last session and educating on 2 new stretches-the shoulder internal rotation and external rotation stretches bolded below.  She states moist heat feels good and has no ill effects afterwards.  She then performs her scapular exercises in 4 planes followed by supine exercises and AAROM.  She is recommended to start propping up to a 45 degree angle and also do actively without the aid of a cane if tolerated.  She then turns to her side to perform sleeper stretches gently with cues and supervision for safety and support.  Next, she stands to perform doorway stretches followed by wall climbs which she tolerates well.  She also is edu on shoulder RTC isometrics and performs in 6 plane  as listed below. She leaves in no significant pain, states feeling better at the end of session. She is reminded to do no "weight lifting" or lift anything heavy with Rt arm yet.   Exercises - Seated Scapular Retraction  - 4-6 x daily -  5-10 reps - lying cane exercises   - 4-5 x daily - 10-15 reps - Sleeper Stretch  - 3-4 x daily - 3-5 reps - 15 hold - Doorway Stretches  - 4-5 x daily - 3 reps - 15 sec hold - Wall Climb  - 4-5 x daily - 3-5 reps - 15 sec hold - Standing Shoulder Abduction Slides at Wall  -  4-5 x daily - 3-5 reps - 15 sec hold - Standing Isometric Shoulder Flexion with Doorway - Arm Bent  - 2-3 x daily - 1 sets - 5-10 reps - 3-5 sec hold - Standing Isometric Shoulder Extension with Doorway - Arm Bent  - 2-3 x daily - 1 sets - 5-10 reps - 3-5 sec hold - Standing Isometric Shoulder Abduction with Doorway - Arm Bent  - 2-3 x daily - 1 sets - 5-10 reps - 3-5 sec hold - Isometric Shoulder Adduction  - 2-3 x daily - 1 sets - 5-10 reps - 3-5 sec hold - Standing Isometric Shoulder Internal Rotation at Doorway  - 2-3 x daily - 1 sets - 5-10 reps - 3-5 sec hold - Standing Isometric Shoulder External Rotation with Doorway  - 2-3 x daily - 1 sets - 5-10 reps - 3-5 sec holding Shoulder Abduction Slides at Wall  - 4-5 x daily - 3-5 reps - 15 sec hold   PATIENT EDUCATION: Education details: See tx section above for details  Person educated: Patient Education method: Verbal Instruction, Teach back, Handouts  Education comprehension: States and demonstrates understanding, Additional Education required    HOME EXERCISE PROGRAM: Access Code: [email or web address] URL: [email or web address] Date: 05/19/2023 Prepared by: Fannie Knee   GOALS: Goals reviewed with patient? Yes   SHORT TERM GOALS: (STG required if POC>30 days) Target Date: 05/29/23  Pt will demo/state understanding of initial HEP to improve pain levels and prerequisite motion. Goal status: INITIAL   LONG TERM GOALS: Target Date: 06/19/23  Pt will improve functional ability by decreased impairment per Quick DASH assessment from 73% to 20% or better, for better quality of life. Goal status: INITIAL  2.  Pt will improve grip strength in Rt hand  from 69lbs to at least 75lbs for functional use at home and in IADLs. Goal status: INITIAL  3.  Pt will improve A/ROM in Rt sh abd from 108* to at least 135*, to have functional motion for tasks like reach and grasp.  Goal status: INITIAL  4.  Pt will improve strength in Rt sh flexion from 3-/5 MMT to at least 4+/5 MMT to have increased functional ability to carry out selfcare and higher-level homecare tasks with no difficulty. Goal status: INITIAL  5.  Pt will improve coordination skills in Rt arm, as seen by better score on box and blocks testing to at least 65 blocks to have increased functional ability to carry out fine motor tasks (fasteners, etc.) and more complex, coordinated IADLs (meal prep, sports, etc.).  Goal status: INITIAL  6.  Pt will decrease pain at worst from 7-8/10 to 3/10 or better to have better sleep and occupational participation in daily roles. Goal status: INITIAL  ASSESSMENT:  CLINICAL IMPRESSION: 06/02/23: She has noticeable postural issues today with forward shoulder and soreness through upper trap.  She was encouraged to watch her postures and keep performing back strengthening.  05/26/23:  Tolerating new stretches well, pain staying low, motion improving. Carry on.    PLAN:  OT FREQUENCY: 2x/week  OT DURATION: 4 weeks (through 06/19/23 as needed)   PLANNED INTERVENTIONS: self care/ADL training, therapeutic exercise, therapeutic activity, neuromuscular re-education, manual therapy, scar mobilization, passive range of motion, electrical stimulation, ultrasound, compression bandaging, moist heat, cryotherapy, patient/family education, energy conservation, coping strategies training, Re-evaluation, and Dry needling  CONSULTED AND AGREED WITH PLAN OF CARE: Patient  PLAN FOR NEXT SESSION:   Review DF seated external rotation stretch as well as Houston's prone  rowing, consider light biceps and triceps strengthening with therapy bands.  Continue manual therapy as  helpful.  Fannie Knee, OTR/L 06/02/2023, 9:30 AM

## 2023-06-01 ENCOUNTER — Encounter (HOSPITAL_BASED_OUTPATIENT_CLINIC_OR_DEPARTMENT_OTHER): Payer: Self-pay | Admitting: Family Medicine

## 2023-06-01 ENCOUNTER — Other Ambulatory Visit: Payer: Self-pay

## 2023-06-01 ENCOUNTER — Other Ambulatory Visit (HOSPITAL_COMMUNITY): Payer: Self-pay

## 2023-06-01 ENCOUNTER — Other Ambulatory Visit (HOSPITAL_BASED_OUTPATIENT_CLINIC_OR_DEPARTMENT_OTHER): Payer: Self-pay

## 2023-06-01 MED ORDER — DICLOFENAC SODIUM 75 MG PO TBEC
75.0000 mg | DELAYED_RELEASE_TABLET | Freq: Two times a day (BID) | ORAL | 1 refills | Status: DC
  Filled 2023-06-01: qty 60, 30d supply, fill #0
  Filled 2023-07-01: qty 60, 30d supply, fill #1

## 2023-06-02 ENCOUNTER — Ambulatory Visit: Payer: 59 | Admitting: Rehabilitative and Restorative Service Providers"

## 2023-06-02 ENCOUNTER — Encounter: Payer: Self-pay | Admitting: Rehabilitative and Restorative Service Providers"

## 2023-06-02 DIAGNOSIS — M25611 Stiffness of right shoulder, not elsewhere classified: Secondary | ICD-10-CM | POA: Diagnosis not present

## 2023-06-02 DIAGNOSIS — M6281 Muscle weakness (generalized): Secondary | ICD-10-CM | POA: Diagnosis not present

## 2023-06-02 DIAGNOSIS — M25511 Pain in right shoulder: Secondary | ICD-10-CM

## 2023-06-02 DIAGNOSIS — R278 Other lack of coordination: Secondary | ICD-10-CM | POA: Diagnosis not present

## 2023-06-03 ENCOUNTER — Encounter: Payer: Self-pay | Admitting: Orthopedic Surgery

## 2023-06-03 ENCOUNTER — Ambulatory Visit (INDEPENDENT_AMBULATORY_CARE_PROVIDER_SITE_OTHER): Payer: 59 | Admitting: Orthopedic Surgery

## 2023-06-03 DIAGNOSIS — M25511 Pain in right shoulder: Secondary | ICD-10-CM

## 2023-06-03 NOTE — Progress Notes (Signed)
Post-Op Visit Note   Patient: Hannah Lynch           Date of Birth: [birth year]           MRN: [id number] Visit Date: 06/03/2023 PCP: Alyson Reedy, FNP   Assessment & Plan:  Chief Complaint:  Chief Complaint  Patient presents with   Right Shoulder - Routine Post Op    04/28/23 right shoulder scope/SAD/DCE   Visit Diagnoses:  1. Acute pain of right shoulder     Plan: [name] is a 52 year old patient who is now about a month out right shoulder arthroscopy with distal clavicle excision.  Doing physical therapy plus home exercises.  She has backed off some of her exercises because of spasm in the shoulder.  Using muscle relaxer for symptoms.  Also using topical.  She does do office work.  On examination she has excellent range of motion and mild tenderness at the superior AC joint region.  At this time I think she is okay to return to work on 06/16/2023 for regular duty.  8-week return for final check with [facility].  Do not want her lifting more than about 3 to 4 pounds with that shoulder until her final check.  Follow-Up Instructions: No follow-ups on file.   Orders:  No orders of the defined types were placed in this encounter.  No orders of the defined types were placed in this encounter.   Imaging: No results found.  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 08/30/2020   Pain in left knee 10/12/2019   Obesity (BMI 30-39.9) 09/27/2019   GERD (gastroesophageal reflux disease) 09/27/2019   Essential hypertension 09/27/2019   Fatty liver 09/27/2019   Metabolic syndrome 09/27/2019    Pain in right ankle and joints of right foot 04/13/2019   Chronic cholecystitis    Primary insomnia 06/25/2017   Papanicolaou smear for cervical cancer screening 06/25/2017   Bilateral hearing loss 06/25/2017   Acute serous otitis media 06/25/2017   Lumbar stenosis with neurogenic claudication 09/25/2014   Past Medical History:  Diagnosis Date   Anxiety    Degenerative disc disease, lumbar    Degenerative disc disease, lumbar 2012   lumbar and cervical region   Depression    Endometriosis    Family history of adverse reaction to anesthesia    pt's mother has hx. of post-op N/V   Headache    History of hiatal hernia    Hypertension    Hypothyroidism    IBS (irritable bowel syndrome)    Nasal septal deviation 11/2017   Nasal turbinate hypertrophy 11/2017   PONV (postoperative nausea and vomiting)    Snoring 11/2017    Family History  Problem Relation Age of Onset   Hypertension Mother    Diabetes Mother    Heart failure Father    Hypertension Father    Diabetes Father    Heart failure Other    Breast cancer Paternal Grandmother     Past Surgical History:  Procedure Laterality Date   ABDOMINAL HYSTERECTOMY  2005   partial - TAH, no cervix, has both  ovaries; done for endometriosis   ANTERIOR CERVICAL DECOMP/DISCECTOMY FUSION  03/25/2007   C4-5   CERVICAL FUSION  2013   CHOLECYSTECTOMY N/A 03/26/2018   Procedure: LAPAROSCOPIC CHOLECYSTECTOMY;  Surgeon: Franky Macho, MD;  Location: [facility];  Service: General;  Laterality: N/A;   COLONOSCOPY     HIATAL HERNIA REPAIR     completed with gastric sleeve   KNEE ARTHROSCOPY Left    LAPAROSCOPIC GASTRIC SLEEVE RESECTION N/A 09/27/2019   Procedure: LAPAROSCOPIC GASTRIC SLEEVE RESECTION, HIATAL HERNIA REPARI ;Upper Endo, Eras Pathway;  Surgeon: Gaynelle Adu, MD;  Location: [facility];  Service: General;  Laterality: N/A;   LUMBAR LAMINECTOMY/DECOMPRESSION MICRODISCECTOMY  11/21/2004   L4-5   MAXIMUM ACCESS (MAS)POSTERIOR LUMBAR  INTERBODY FUSION (PLIF) 1 LEVEL N/A 09/25/2014   Procedure: Lumbar four-five MAXIMUM ACCESS (MAS) POSTERIOR LUMBAR INTERBODY FUSION (PLIF) 1 LEVEL;  Surgeon: Maeola Harman, MD;  [facility];  Service: Neurosurgery;  Laterality: N/A;   NASAL SEPTOPLASTY W/ TURBINOPLASTY N/A 12/07/2017   Procedure: NASAL SEPTOPLASTY WITH BILATERAL TURBINATE REDUCTION;  Surgeon: Christia Reading, MD;  Location: [facility];  Service: ENT;  Laterality: N/A;   ORIF ANKLE FRACTURE Right    TONSILLECTOMY  06/2018   UPPER GI ENDOSCOPY     Social History   Occupational History   Not on file  Tobacco Use   Smoking status: Never   Smokeless tobacco: Never  Vaping Use   Vaping status: Never Used  Substance and Sexual Activity   Alcohol use: Yes    Comment: once in awhile   Drug use: Never   Sexual activity: Yes    Birth control/protection: Surgical    Comment: hyst

## 2023-06-04 ENCOUNTER — Other Ambulatory Visit (HOSPITAL_COMMUNITY)
Admission: RE | Admit: 2023-06-04 | Discharge: 2023-06-04 | Disposition: A | Discharge status: Home / Self Care | Payer: Self-pay | Source: Other Acute Inpatient Hospital | Attending: Oncology | Admitting: Oncology

## 2023-06-04 ENCOUNTER — Telehealth: Payer: Self-pay | Admitting: Rehabilitative and Restorative Service Providers"

## 2023-06-04 ENCOUNTER — Encounter: Payer: 59 | Admitting: Rehabilitative and Restorative Service Providers"

## 2023-06-04 DIAGNOSIS — Z006 Encounter for examination for normal comparison and control in clinical research program: Secondary | ICD-10-CM | POA: Insufficient documentation

## 2023-06-04 NOTE — Telephone Encounter (Signed)
OT called patient to discuss 3rd missed appointment. She has never shown up 2 x week, despite having convo with OT in last session that she would "be here on Thursday."     OT left message with pt about next appointment date/time (06/11/23, @845 ), and also tat she will be moved to 1x weekly appointments due to frequent appointment misses/policy.   If she continues to miss, she will be discharged from therapy per policy.

## 2023-06-06 ENCOUNTER — Other Ambulatory Visit (HOSPITAL_COMMUNITY): Payer: Self-pay

## 2023-06-08 ENCOUNTER — Other Ambulatory Visit: Payer: Self-pay

## 2023-06-09 ENCOUNTER — Other Ambulatory Visit (HOSPITAL_COMMUNITY): Payer: Self-pay

## 2023-06-11 ENCOUNTER — Other Ambulatory Visit: Payer: Self-pay

## 2023-06-11 ENCOUNTER — Other Ambulatory Visit (HOSPITAL_COMMUNITY): Payer: Self-pay

## 2023-06-11 ENCOUNTER — Encounter: Payer: 59 | Admitting: Rehabilitative and Restorative Service Providers"

## 2023-06-11 MED ORDER — ORPHENADRINE CITRATE ER 100 MG PO TB12
100.0000 mg | ORAL_TABLET | Freq: Two times a day (BID) | ORAL | 0 refills | Status: DC | PRN
  Filled 2023-06-11: qty 60, 30d supply, fill #0

## 2023-06-15 ENCOUNTER — Other Ambulatory Visit (HOSPITAL_COMMUNITY): Payer: Self-pay

## 2023-06-18 ENCOUNTER — Encounter: Payer: 59 | Admitting: Rehabilitative and Restorative Service Providers"

## 2023-07-01 ENCOUNTER — Other Ambulatory Visit (HOSPITAL_COMMUNITY): Payer: Self-pay

## 2023-07-07 DIAGNOSIS — M5134 Other intervertebral disc degeneration, thoracic region: Secondary | ICD-10-CM | POA: Diagnosis not present

## 2023-07-07 DIAGNOSIS — M4804 Spinal stenosis, thoracic region: Secondary | ICD-10-CM | POA: Diagnosis not present

## 2023-07-15 ENCOUNTER — Encounter (HOSPITAL_BASED_OUTPATIENT_CLINIC_OR_DEPARTMENT_OTHER): Payer: Self-pay | Admitting: Family Medicine

## 2023-07-16 ENCOUNTER — Other Ambulatory Visit (HOSPITAL_BASED_OUTPATIENT_CLINIC_OR_DEPARTMENT_OTHER): Payer: Self-pay

## 2023-07-16 MED ORDER — TOPIRAMATE 25 MG PO TABS: 50.0000 mg | ORAL_TABLET | Freq: Two times a day (BID) | ORAL | 0 refills | Status: DC

## 2023-07-27 ENCOUNTER — Ambulatory Visit (INDEPENDENT_AMBULATORY_CARE_PROVIDER_SITE_OTHER): Payer: Self-pay | Admitting: Orthopedic Surgery

## 2023-07-27 ENCOUNTER — Encounter: Payer: Self-pay | Admitting: Orthopedic Surgery

## 2023-07-27 DIAGNOSIS — M25511 Pain in right shoulder: Secondary | ICD-10-CM

## 2023-07-27 NOTE — Progress Notes (Signed)
Post-Op Visit Note   Patient: Hannah Lynch           Date of Birth: [birth year]           MRN: [id number] Visit Date: 07/27/2023 PCP: Alyson Reedy, FNP   Assessment & Plan:  Chief Complaint:  Chief Complaint  Patient presents with   Right Shoulder - Routine Post Op    04/28/23 right shoulder scope/SAD/DCE   Visit Diagnoses:  1. Acute pain of right shoulder     Plan: [name] is a 52 year old patient is now 3 months out right shoulder arthroscopy subacromial decompression distal clavicle excision.  Overall she is doing well.  Working in [employer].  She is done with physical therapy.  Not taking any medication.  On examination her range of motion is 30/110/170.  Rotator cuff strength 5+ out of 5 internal rotation and external rotation.  She is doing well.  Plan for her to follow-up as needed.  I think she will get back that last little bit of external rotation within the next 2 to 3 months.  Follow-Up Instructions: No follow-ups on file.   Orders:  No orders of the defined types were placed in this encounter.  No orders of the defined types were placed in this encounter.   Imaging: No results found.  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 08/30/2020   Pain in left knee 10/12/2019   Obesity (BMI 30-39.9) 09/27/2019   GERD (gastroesophageal reflux disease) 09/27/2019   Essential hypertension 09/27/2019   Fatty liver 09/27/2019   Metabolic syndrome 09/27/2019   Pain in right ankle and joints of right foot 04/13/2019   Chronic cholecystitis    Primary insomnia  06/25/2017   Papanicolaou smear for cervical cancer screening 06/25/2017   Bilateral hearing loss 06/25/2017   Acute serous otitis media 06/25/2017   Lumbar stenosis with neurogenic claudication 09/25/2014   Past Medical History:  Diagnosis Date   Anxiety    Degenerative disc disease, lumbar    Degenerative disc disease, lumbar 2012   lumbar and cervical region   Depression    Endometriosis    Family history of adverse reaction to anesthesia    pt's mother has hx. of post-op N/V   Headache    History of hiatal hernia    Hypertension    Hypothyroidism    IBS (irritable bowel syndrome)    Nasal septal deviation 11/2017   Nasal turbinate hypertrophy 11/2017   PONV (postoperative nausea and vomiting)    Snoring 11/2017    Family History  Problem Relation Age of Onset   Hypertension Mother    Diabetes Mother    Heart failure Father    Hypertension Father    Diabetes Father    Heart failure Other    Breast cancer Paternal Grandmother     Past Surgical History:  Procedure Laterality Date   ABDOMINAL HYSTERECTOMY  2005   partial - TAH, no cervix, has both ovaries; done for endometriosis   ANTERIOR CERVICAL DECOMP/DISCECTOMY FUSION  03/25/2007   C4-5   CERVICAL FUSION  2013   CHOLECYSTECTOMY  N/A 03/26/2018   Procedure: LAPAROSCOPIC CHOLECYSTECTOMY;  Surgeon: Franky Macho, MD;  Location: [facility];  Service: General;  Laterality: N/A;   COLONOSCOPY     HIATAL HERNIA REPAIR     completed with gastric sleeve   KNEE ARTHROSCOPY Left    LAPAROSCOPIC GASTRIC SLEEVE RESECTION N/A 09/27/2019   Procedure: LAPAROSCOPIC GASTRIC SLEEVE RESECTION, HIATAL HERNIA REPARI ;Upper Endo, Eras Pathway;  Surgeon: Gaynelle Adu, MD;  Location: [facility];  Service: General;  Laterality: N/A;   LUMBAR LAMINECTOMY/DECOMPRESSION MICRODISCECTOMY  11/21/2004   L4-5   MAXIMUM ACCESS (MAS)POSTERIOR LUMBAR INTERBODY FUSION (PLIF) 1 LEVEL N/A 09/25/2014   Procedure: Lumbar four-five MAXIMUM ACCESS (MAS)  POSTERIOR LUMBAR INTERBODY FUSION (PLIF) 1 LEVEL;  Surgeon: Maeola Harman, MD;  [facility];  Service: Neurosurgery;  Laterality: N/A;   NASAL SEPTOPLASTY W/ TURBINOPLASTY N/A 12/07/2017   Procedure: NASAL SEPTOPLASTY WITH BILATERAL TURBINATE REDUCTION;  Surgeon: Christia Reading, MD;  Location: [facility];  Service: ENT;  Laterality: N/A;   ORIF ANKLE FRACTURE Right    TONSILLECTOMY  06/2018   UPPER GI ENDOSCOPY     Social History   Occupational History   Not on file  Tobacco Use   Smoking status: Never   Smokeless tobacco: Never  Vaping Use   Vaping status: Never Used  Substance and Sexual Activity   Alcohol use: Yes    Comment: once in awhile   Drug use: Never   Sexual activity: Yes    Birth control/protection: Surgical    Comment: hyst

## 2023-07-28 ENCOUNTER — Other Ambulatory Visit: Payer: Self-pay

## 2023-08-07 ENCOUNTER — Other Ambulatory Visit (HOSPITAL_BASED_OUTPATIENT_CLINIC_OR_DEPARTMENT_OTHER): Payer: Self-pay | Admitting: Family Medicine

## 2023-08-10 MED ORDER — TOPIRAMATE 25 MG PO TABS: 50.0000 mg | ORAL_TABLET | Freq: Two times a day (BID) | ORAL | 0 refills | Status: DC

## 2023-08-10 MED ORDER — DICLOFENAC SODIUM 75 MG PO TBEC: 75.0000 mg | DELAYED_RELEASE_TABLET | Freq: Two times a day (BID) | ORAL | 1 refills | Status: DC

## 2023-08-12 LAB — HELIX MOLECULAR SCREEN: Genetic Analysis Overall Interpretation: NEGATIVE

## 2023-08-24 ENCOUNTER — Other Ambulatory Visit (HOSPITAL_COMMUNITY)
Admission: RE | Admit: 2023-08-24 | Discharge: 2023-08-24 | Disposition: A | Discharge status: Home / Self Care | Payer: Managed Care, Other (non HMO) | Source: Ambulatory Visit | Attending: Family Medicine | Admitting: Family Medicine

## 2023-08-24 ENCOUNTER — Ambulatory Visit (HOSPITAL_BASED_OUTPATIENT_CLINIC_OR_DEPARTMENT_OTHER): Payer: Managed Care, Other (non HMO) | Admitting: Family Medicine

## 2023-08-24 ENCOUNTER — Encounter (HOSPITAL_BASED_OUTPATIENT_CLINIC_OR_DEPARTMENT_OTHER): Payer: Self-pay | Admitting: Family Medicine

## 2023-08-24 VITALS — BP 102/78 | HR 79 | Ht 63.0 in | Wt 164.0 lb

## 2023-08-24 DIAGNOSIS — F419 Anxiety disorder, unspecified: Secondary | ICD-10-CM | POA: Diagnosis not present

## 2023-08-24 DIAGNOSIS — F321 Major depressive disorder, single episode, moderate: Secondary | ICD-10-CM | POA: Diagnosis not present

## 2023-08-24 DIAGNOSIS — Z23 Encounter for immunization: Secondary | ICD-10-CM

## 2023-08-24 DIAGNOSIS — Z124 Encounter for screening for malignant neoplasm of cervix: Secondary | ICD-10-CM | POA: Diagnosis present

## 2023-08-24 DIAGNOSIS — Z0000 Encounter for general adult medical examination without abnormal findings: Secondary | ICD-10-CM | POA: Insufficient documentation

## 2023-08-24 HISTORY — DX: Major depressive disorder, single episode, moderate: F32.1

## 2023-08-24 MED ORDER — DICLOFENAC SODIUM 75 MG PO TBEC: 75.0000 mg | DELAYED_RELEASE_TABLET | Freq: Two times a day (BID) | ORAL | 1 refills | Status: DC

## 2023-08-24 MED ORDER — TRAZODONE HCL 150 MG PO TABS: 150.0000 mg | ORAL_TABLET | Freq: Every day | ORAL | 2 refills | Status: DC

## 2023-08-24 MED ORDER — LEVOTHYROXINE SODIUM 25 MCG PO TABS: 25.0000 ug | ORAL_TABLET | Freq: Every day | ORAL | 2 refills | Status: DC

## 2023-08-24 MED ORDER — CITALOPRAM HYDROBROMIDE 10 MG PO TABS
10.0000 mg | ORAL_TABLET | Freq: Every day | ORAL | 3 refills | Status: DC
Start: 2023-08-24 — End: 2023-10-05

## 2023-08-24 MED ORDER — MONTELUKAST SODIUM 10 MG PO TABS: 10.0000 mg | ORAL_TABLET | Freq: Every day | ORAL | 2 refills | Status: DC

## 2023-08-24 NOTE — Progress Notes (Signed)
Complete physical exam  Patient: Hannah Lynch   DOB: 06/17/1971   52 y.o. Female  MRN: [id number]  Subjective:    Hannah Lynch is a 52 y.o. female who presents today for a complete physical exam. She reports consuming a general diet. Exercise is limited by chronic medical conditions. She generally feels well. She reports sleeping poorly. She does have additional problems to discuss today- regarding her mood.   HEALTH SCREENINGS: - Vision Screening: Feb or March 2024 - Dental Visits: has not seen, has dentures  - Pap smear: pap done today - Breast Exam: done today  - STD Screening: Declined - Mammogram (40+): Up to date  - Colonoscopy (45+): Up to date  - Bone Density (65+ or under 65 with predisposing conditions): Not applicable  - Lung CA screening with low-dose CT:  Not applicable Adults age 30-80 who are current cigarette smokers or quit within the last 15 years. Must have 20 pack year history.   IMMUNIZATIONS: - Tdap: Tetanus vaccination status reviewed: last tetanus booster within 10 years. - HPV: Not applicable - Influenza: Administered today - Pneumovax: Not applicable - Prevnar 20: Not applicable - Zostavax (50+): discussed, not given today  Depression screenings:    08/24/2023    9:30 AM 05/20/2023    9:44 AM 03/05/2022    9:58 AM  Depression screen PHQ 2/9  Decreased Interest 0 0 0  Down, Depressed, Hopeless 1 0 0  PHQ - 2 Score 1 0 0  Altered sleeping 2 3 0  Tired, decreased energy 2 1 1   Change in appetite 0 0 1  Feeling bad or failure about yourself  2 0 0  Trouble concentrating 0 0 0  Moving slowly or fidgety/restless 0 0 0  Suicidal thoughts 0 0 0  PHQ-9 Score 7 4 2   Difficult doing work/chores Somewhat difficult Somewhat difficult     Anxiety screenings:    08/24/2023    9:30 AM 05/20/2023    9:44 AM 03/05/2022    9:59 AM  GAD 7 : Generalized Anxiety Score  Nervous, Anxious, on Edge 1 1 0  Control/stop worrying 2 1 0  Worry too much -  different things 2 2 0  Trouble relaxing 1 0 0  Restless 0 0 0  Easily annoyed or irritable 0 0 0  Afraid - awful might happen 0 0 0  Total GAD 7 Score 6 4 0  Anxiety Difficulty Somewhat difficult Somewhat difficult     Patient Care Team: Alyson Reedy, FNP as PCP - General (Family Medicine) Laqueta Linden, MD (Inactive) as PCP - Cardiology (Cardiology)   Outpatient Medications Prior to Visit  Medication Sig   docusate sodium (COLACE) 100 MG capsule Take 100 mg by mouth daily.   metoprolol succinate (TOPROL-XL) 25 MG 24 hr tablet Take 1 tablet (25 mg total) by mouth daily.   Multiple Vitamin (MULTIVITAMIN) tablet Take 1 tablet by mouth daily.   orphenadrine (NORFLEX) 100 MG tablet Take 1 tablet (100 mg total) by mouth 2 (two) times daily as needed for muscle spasms.   topiramate (TOPAMAX) 25 MG tablet Take 2 tablets (50 mg total) by mouth 2 (two) times daily.   [DISCONTINUED] diclofenac (VOLTAREN) 75 MG EC tablet Take 1 tablet (75 mg total) by mouth 2 (two) times daily.   [DISCONTINUED] levothyroxine (SYNTHROID) 25 MCG tablet Take 1 tablet (25 mcg total) by mouth daily.   [DISCONTINUED] montelukast (SINGULAIR) 10 MG tablet Take 1 tablet (10 mg total)  by mouth daily.   [DISCONTINUED] traZODone (DESYREL) 150 MG tablet Take 1 tablet (150 mg total) by mouth at bedtime.   [DISCONTINUED] lactated ringers infusion    No facility-administered medications prior to visit.    Review of Systems  Constitutional:  Negative for malaise/fatigue.  HENT:  Negative for hearing loss.   Eyes:  Negative for blurred vision and double vision.  Respiratory:  Negative for cough and shortness of breath.   Cardiovascular:  Negative for chest pain, palpitations and leg swelling.  Gastrointestinal:  Negative for abdominal pain, nausea and vomiting.  Genitourinary:  Negative for frequency, hematuria and urgency.  Musculoskeletal:  Negative for myalgias.  Neurological:  Negative for dizziness,  weakness and headaches.  Psychiatric/Behavioral:  Negative for depression and suicidal ideas. The patient is nervous/anxious and has insomnia.        Objective:     BP 102/78   Pulse 79   Ht 5\' 3"  (1.6 m)   Wt 164 lb (74.4 kg)   SpO2 100%   BMI 29.05 kg/m  BP Readings from Last 3 Encounters:  08/24/23 102/78  05/20/23 100/73  04/28/23 116/75     Physical Exam Vitals reviewed. Exam conducted with a chaperone present.  Constitutional:      Appearance: Normal appearance.  HENT:     Head: Normocephalic.     Right Ear: Tympanic membrane, ear canal and external ear normal.     Left Ear: Tympanic membrane, ear canal and external ear normal.     Nose: Nose normal.     Mouth/Throat:     Mouth: Mucous membranes are moist.     Pharynx: Oropharynx is clear.  Eyes:     Extraocular Movements: Extraocular movements intact.     Pupils: Pupils are equal, round, and reactive to light.  Cardiovascular:     Rate and Rhythm: Normal rate and regular rhythm.     Pulses: Normal pulses.     Heart sounds: Normal heart sounds.  Pulmonary:     Effort: Pulmonary effort is normal.     Breath sounds: Normal breath sounds.  Abdominal:     General: Abdomen is flat. Bowel sounds are normal.     Palpations: Abdomen is soft.     Hernia: There is no hernia in the left inguinal area or right inguinal area.  Genitourinary:    Exam position: Lithotomy position.     Pubic Area: No rash or pubic lice.      Tanner stage (genital): 5.     Labia:        Right: No rash.        Left: No rash.      Vagina: Normal. No vaginal discharge, erythema, tenderness or bleeding.     Comments: Cervix not present  Musculoskeletal:        General: Normal range of motion.     Cervical back: Normal range of motion.  Lymphadenopathy:     Lower Body: No right inguinal adenopathy. No left inguinal adenopathy.  Skin:    General: Skin is warm and dry.  Neurological:     Mental Status: She is alert.  Psychiatric:         Mood and Affect: Mood normal.        Behavior: Behavior normal.        Thought Content: Thought content normal.        Judgment: Judgment normal.       Assessment & Plan:    Routine Health Maintenance and  Physical Exam  Health Maintenance  Topic Date Due   Pap with HPV screening  Never done   Zoster (Shingles) Vaccine (1 of 2) Never done   Flu Shot  06/11/2023   COVID-19 Vaccine (4 - 2023-24 season) 07/12/2023   DTaP/Tdap/Td vaccine (2 - Td or Tdap) 10/11/2023   Mammogram  03/29/2024   Colon Cancer Screening  12/29/2028   Hepatitis C Screening  Completed   HIV Screening  Completed   HPV Vaccine  Aged Out    Wellness examination Assessment & Plan: Routine HCM labs ordered. Will obtain labs today and update patient with results.  Review of PMH, FH, SH, medications and HM performed. Preventative care hand-out provided.  Recommend healthy diet.  Recommend approximately 150 minutes/week of moderate intensity exercise. Recommend regular dental and vision exams. Always use seatbelt/lap and shoulder restraints. Recommend using smoke alarms and checking batteries at least twice a year. Recommend using sunscreen when outside. Discussed immunization recommendations for shingles, influenza, and tetanus vaccine. Patient agreed to proceed with influenza vaccine  today.   Orders: -     CBC with Differential/Platelet -     Comprehensive metabolic panel -     Hemoglobin A1c -     Lipid panel -     TSH Rfx on Abnormal to Free T4  Anxiety Assessment & Plan: GAD7 completed with score of 6. Patient reports an increase in life stressors. Will start Celexa 10mg . Reports having a difficult time falling asleep, therefore, she has been using trazodone almost nightly. Plan to follow-up in 6 weeks.   Orders: -     Citalopram Hydrobromide; Take 1 tablet (10 mg total) by mouth daily.  Dispense: 30 tablet; Refill: 3  Depression, major, single episode, moderate (HCC) Assessment & Plan: PHQ9  completed with score of 7. Denies SI/HI. Recently dealing with being on house arrest due to recent charges and has lost her job. She reports she is living with her parents now and is unable to see her husband. She is tearful during the visit and reports she is stressed. Discussed pharmacotherapy and has been on celexa and leaxpro in the past. Patient willing to try Celexa at this time. Discussed increased risk of serotonin syndrome with concurrent use of Celexa and trazodone. Discussed symptoms with patient and counseled her to reach out immediately. Will start patient on Celexa 10mg  daily. Plan to reassess mood in 6 weeks.   Orders: -     Citalopram Hydrobromide; Take 1 tablet (10 mg total) by mouth daily.  Dispense: 30 tablet; Refill: 3  Screening for cervical cancer Assessment & Plan: Patient had TAH 2005- no cervix present on exam. Completed vaginal pap today.   Orders: -     Cytology - PAP  Other orders -     traZODone HCl; Take 1 tablet (150 mg total) by mouth at bedtime.  Dispense: 90 tablet; Refill: 2 -     Diclofenac Sodium; Take 1 tablet (75 mg total) by mouth 2 (two) times daily.  Dispense: 60 tablet; Refill: 1 -     Levothyroxine Sodium; Take 1 tablet (25 mcg total) by mouth daily.  Dispense: 90 tablet; Refill: 2 -     Montelukast Sodium; Take 1 tablet (10 mg total) by mouth daily.  Dispense: 90 tablet; Refill: 2   Return in about 6 weeks (around 10/05/2023) for Mood f/u (can be virtual) .    Alyson Reedy, FNP

## 2023-08-24 NOTE — Assessment & Plan Note (Signed)
Routine HCM labs ordered. Will obtain labs today and update patient with results.  Review of PMH, FH, SH, medications and HM performed. Preventative care hand-out provided.  Recommend healthy diet.  Recommend approximately 150 minutes/week of moderate intensity exercise. Recommend regular dental and vision exams. Always use seatbelt/lap and shoulder restraints. Recommend using smoke alarms and checking batteries at least twice a year. Recommend using sunscreen when outside. Discussed immunization recommendations for shingles, influenza, and tetanus vaccine. Patient agreed to proceed with influenza vaccine  today.

## 2023-08-24 NOTE — Assessment & Plan Note (Addendum)
Patient had TAH 2005- no cervix present on exam. Completed vaginal pap today.

## 2023-08-24 NOTE — Patient Instructions (Signed)
MyChart:  For all urgent or time sensitive needs we ask that you please call the office to avoid delays. Our number is [phone]. MyChart is not constantly monitored and due to the large volume of messages a day, replies may take up to 72 business hours.   MyChart Policy: MyChart allows for you to see your visit notes, after visit summary, provider recommendations, lab and tests results, make an appointment, request refills, and contact your provider or the office for non-urgent questions or concerns. Providers are seeing patients during normal business hours and do not have built in time to review MyChart messages.  We ask that you allow a minimum of 3 business days for responses to [employer]. For this reason, please do not send urgent requests through MyChart. Please call the office at [phone]. New and ongoing conditions may require a visit. We have virtual and in person visit available for your convenience.  Complex MyChart concerns may require a visit. Your provider may request you schedule a virtual or in person visit to ensure we are providing the best care possible. MyChart messages sent after 11:00 AM on Friday will not be received by the provider until Monday morning.    Lab and Test Results: You will receive your lab and test results on MyChart as soon as they are completed and results have been sent by the lab or testing facility. Due to this service, you will receive your results BEFORE your provider.  I review lab and tests results each morning prior to seeing patients. Some results require collaboration with other providers to ensure you are receiving the most appropriate care. For this reason, we ask that you please allow a minimum of 3-5 business days from the time the ALL results have been received for your provider to receive and review lab and test results and contact you about these.  Most lab and test result comments from the provider will be sent through MyChart.  Your provider may recommend changes to the plan of care, follow-up visits, repeat testing, ask questions, or request an office visit to discuss these results. You may reply directly to this message or call the office at [phone] to provide information for the provider or set up an appointment. In some instances, you will be called with test results and recommendations. Please let us know if this is preferred and we will make note of this in your chart to provide this for you.    If you have not heard a response to your lab or test results in 5 business days from all results returning to MyChart, please call the office to let us know. We ask that you please avoid calling prior to this time unless there is an emergent concern. Due to high call volumes, this can delay the resulting process.   After Hours: For all non-emergency after hours needs, please call the office at [phone] and select the option to reach the on-call provider service. On-call services are shared between multiple [place] offices and therefore it will not be possible to speak directly with your provider. On-call providers may provide medical advice and recommendations, but are unable to provide refills for maintenance medications.  For all emergency or urgent medical needs after normal business hours, we recommend that you seek care at the closest Urgent Care or Emergency Department to ensure appropriate treatment in a timely manner.  [facility] [place] has a 24 hour emergency room located on the ground floor for your  convenience.    Urgent Concerns During the Business Day Providers are seeing patients from 8AM to 5PM, Monday through Thursday, and 8AM to 12PM on Friday with a busy schedule and are most often not able to respond to non-urgent calls until the end of the day or the next business day. If you should have URGENT concerns during the day, please call and speak to the nurse or schedule a same day  appointment so that we can address your concern without delay.    Thank you, again, for choosing me as your health care partner. I appreciate your trust and look forward to learning more about you.    Hannah Reedy, FNP-C

## 2023-08-24 NOTE — Assessment & Plan Note (Addendum)
GAD7 completed with score of 6. Patient reports an increase in life stressors. Will start Celexa 10mg . Reports having a difficult time falling asleep, therefore, she has been using trazodone almost nightly. Plan to follow-up in 6 weeks.

## 2023-08-24 NOTE — Assessment & Plan Note (Signed)
PHQ9 completed with score of 7. Denies SI/HI. Recently dealing with being on house arrest due to recent charges and has lost her job. She reports she is living with her parents now and is unable to see her husband. She is tearful during the visit and reports she is stressed. Discussed pharmacotherapy and has been on celexa and leaxpro in the past. Patient willing to try Celexa at this time. Discussed increased risk of serotonin syndrome with concurrent use of Celexa and trazodone. Discussed symptoms with patient and counseled her to reach out immediately. Will start patient on Celexa 10mg  daily. Plan to reassess mood in 6 weeks.

## 2023-08-25 LAB — COMPREHENSIVE METABOLIC PANEL
ALT: 22 [IU]/L (ref 0–32)
AST: 16 [IU]/L (ref 0–40)
Albumin: 4.3 g/dL (ref 3.8–4.9)
Alkaline Phosphatase: 66 [IU]/L (ref 44–121)
BUN/Creatinine Ratio: 10 (ref 9–23)
BUN: 10 mg/dL (ref 6–24)
Bilirubin Total: 0.4 mg/dL (ref 0.0–1.2)
CO2: 23 mmol/L (ref 20–29)
Calcium: 9 mg/dL (ref 8.7–10.2)
Chloride: 105 mmol/L (ref 96–106)
Creatinine, Ser: 1.03 mg/dL — ABNORMAL HIGH (ref 0.57–1.00)
Globulin, Total: 2.3 g/dL (ref 1.5–4.5)
Glucose: 82 mg/dL (ref 70–99)
Potassium: 3.5 mmol/L (ref 3.5–5.2)
Sodium: 139 mmol/L (ref 134–144)
Total Protein: 6.6 g/dL (ref 6.0–8.5)
eGFR: 66 mL/min/{1.73_m2} (ref >59)

## 2023-08-25 LAB — CBC WITH DIFFERENTIAL/PLATELET
Basophils Absolute: 0.1 10*3/uL (ref 0.0–0.2)
Basos: 1 %
EOS (ABSOLUTE): 0.1 10*3/uL (ref 0.0–0.4)
Eos: 2 %
Hematocrit: 42.5 % (ref 34.0–46.6)
Hemoglobin: 13.8 g/dL (ref 11.1–15.9)
Immature Grans (Abs): 0 10*3/uL (ref 0.0–0.1)
Immature Granulocytes: 0 %
Lymphocytes Absolute: 2.4 10*3/uL (ref 0.7–3.1)
Lymphs: 33 %
MCH: 33 pg (ref 26.6–33.0)
MCHC: 32.5 g/dL (ref 31.5–35.7)
MCV: 102 fL — ABNORMAL HIGH (ref 79–97)
Monocytes Absolute: 0.5 10*3/uL (ref 0.1–0.9)
Monocytes: 7 %
Neutrophils Absolute: 4.1 10*3/uL (ref 1.4–7.0)
Neutrophils: 57 %
Platelets: 233 10*3/uL (ref 150–450)
RBC: 4.18 x10E6/uL (ref 3.77–5.28)
RDW: 12.6 % (ref 11.7–15.4)
WBC: 7.3 10*3/uL (ref 3.4–10.8)

## 2023-08-25 LAB — LIPID PANEL
Chol/HDL Ratio: 3.3 {ratio} (ref 0.0–4.4)
Cholesterol, Total: 167 mg/dL (ref 100–199)
HDL: 50 mg/dL (ref >39)
LDL Chol Calc (NIH): 101 mg/dL — ABNORMAL HIGH (ref 0–99)
Triglycerides: 84 mg/dL (ref 0–149)
VLDL Cholesterol Cal: 16 mg/dL (ref 5–40)

## 2023-08-25 LAB — TSH RFX ON ABNORMAL TO FREE T4: TSH: 1.68 u[IU]/mL (ref 0.450–4.500)

## 2023-08-25 LAB — HEMOGLOBIN A1C
Est. average glucose Bld gHb Est-mCnc: 91 mg/dL
Hgb A1c MFr Bld: 4.8 % (ref 4.8–5.6)

## 2023-08-26 LAB — CYTOLOGY - PAP
Comment: NEGATIVE
Diagnosis: NEGATIVE
High risk HPV: NEGATIVE

## 2023-09-19 ENCOUNTER — Other Ambulatory Visit (HOSPITAL_BASED_OUTPATIENT_CLINIC_OR_DEPARTMENT_OTHER): Payer: Self-pay | Admitting: Family Medicine

## 2023-09-21 ENCOUNTER — Other Ambulatory Visit (HOSPITAL_BASED_OUTPATIENT_CLINIC_OR_DEPARTMENT_OTHER): Payer: Self-pay | Admitting: Family Medicine

## 2023-09-21 MED ORDER — TOPIRAMATE 25 MG PO TABS: 50.0000 mg | ORAL_TABLET | Freq: Two times a day (BID) | ORAL | 0 refills | Status: DC

## 2023-10-01 ENCOUNTER — Encounter (HOSPITAL_BASED_OUTPATIENT_CLINIC_OR_DEPARTMENT_OTHER): Payer: Self-pay | Admitting: Family Medicine

## 2023-10-05 ENCOUNTER — Encounter (HOSPITAL_BASED_OUTPATIENT_CLINIC_OR_DEPARTMENT_OTHER): Payer: Self-pay | Admitting: Family Medicine

## 2023-10-05 ENCOUNTER — Ambulatory Visit (HOSPITAL_BASED_OUTPATIENT_CLINIC_OR_DEPARTMENT_OTHER): Payer: Managed Care, Other (non HMO) | Admitting: Family Medicine

## 2023-10-05 VITALS — BP 95/70 | HR 84 | Ht 63.0 in | Wt 165.0 lb

## 2023-10-05 DIAGNOSIS — F419 Anxiety disorder, unspecified: Secondary | ICD-10-CM | POA: Diagnosis not present

## 2023-10-05 DIAGNOSIS — J3489 Other specified disorders of nose and nasal sinuses: Secondary | ICD-10-CM | POA: Diagnosis not present

## 2023-10-05 DIAGNOSIS — F321 Major depressive disorder, single episode, moderate: Secondary | ICD-10-CM

## 2023-10-05 MED ORDER — AMOXICILLIN-POT CLAVULANATE 875-125 MG PO TABS
1.0000 | ORAL_TABLET | Freq: Two times a day (BID) | ORAL | 0 refills | Status: AC
Start: 2023-10-05 — End: 2023-10-10

## 2023-10-05 MED ORDER — CITALOPRAM HYDROBROMIDE 20 MG PO TABS: 20.0000 mg | ORAL_TABLET | Freq: Every day | ORAL | 3 refills | Status: DC

## 2023-10-05 NOTE — Progress Notes (Signed)
Established Patient Office Visit  Subjective   Patient ID: Hannah Lynch, female    DOB: 26-Apr-1971  Age: 52 y.o. MRN: [id number]  Chief Complaint  Patient presents with  . Anxiety    6 week follow up, feels better  . Cough    Ongoing for about 4 days, has tried cold and flu. Started with an itchy throat. Yellow/green mucus   ANXIETY: Hannah Lynch is a 52 year old female patient who presents for the medical management of anxiety.  Current medication regimen: Celexa 10mg  daily  Well controlled: yes  Denies SI/HI.      10/05/2023    9:54 AM 08/24/2023    9:30 AM 05/20/2023    9:44 AM 03/05/2022    9:59 AM  GAD 7 : Generalized Anxiety Score  Nervous, Anxious, on Edge 1 1 1  0  Control/stop worrying 0 2 1 0  Worry too much - different things 1 2 2  0  Trouble relaxing 0 1 0 0  Restless 0 0 0 0  Easily annoyed or irritable 0 0 0 0  Afraid - awful might happen 0 0 0 0  Total GAD 7 Score 2 6 4  0  Anxiety Difficulty Not difficult at all Somewhat difficult Somewhat difficult       10/05/2023    9:52 AM 08/24/2023    9:30 AM 05/20/2023    9:44 AM  PHQ9 SCORE ONLY  PHQ-9 Total Score 2 7 4    Patient also has concerns about acute illness. Patient reports she has experienced nasal congestion, productive cough, ear fullness, sinus pressure, and sore throat for almost a week. Denies shob, wheezing, fever/chills, fatigue.   Review of Systems  Constitutional:  Negative for chills, fever and malaise/fatigue.  HENT:  Positive for sinus pain (pressure) and sore throat. Negative for congestion, ear discharge, ear pain, hearing loss and tinnitus.   Respiratory:  Positive for cough and sputum production. Negative for shortness of breath, wheezing and stridor.   Cardiovascular:  Negative for chest pain and palpitations.  Gastrointestinal:  Negative for abdominal pain, nausea and vomiting.  Neurological:  Negative for dizziness and headaches.  Psychiatric/Behavioral:  Negative for  depression and suicidal ideas. The patient is not nervous/anxious and does not have insomnia.      Objective:    BP 95/70   Pulse 84   Ht 5\' 3"  (1.6 m)   Wt 165 lb (74.8 kg)   SpO2 100%   BMI 29.23 kg/m  BP Readings from Last 3 Encounters:  10/05/23 95/70  08/24/23 102/78  05/20/23 100/73     Physical Exam Vitals reviewed.  Constitutional:      Appearance: Normal appearance.  HENT:     Right Ear: Tympanic membrane, ear canal and external ear normal.     Left Ear: Tympanic membrane, ear canal and external ear normal.     Nose: Congestion present.     Right Turbinates: Enlarged and swollen.     Left Turbinates: Enlarged and swollen.     Right Sinus: Maxillary sinus tenderness present. No frontal sinus tenderness.     Left Sinus: Maxillary sinus tenderness present. No frontal sinus tenderness.     Mouth/Throat:     Mouth: Mucous membranes are moist.     Pharynx: No oropharyngeal exudate or posterior oropharyngeal erythema.  Cardiovascular:     Rate and Rhythm: Normal rate and regular rhythm.     Pulses: Normal pulses.     Heart sounds: Normal heart sounds.  Pulmonary:  Effort: Pulmonary effort is normal.     Breath sounds: Normal breath sounds.  Neurological:     Mental Status: She is alert.  Psychiatric:        Mood and Affect: Mood normal.        Behavior: Behavior normal.    Assessment & Plan:   1. Anxiety Patient is a pleasant 51 year old female patient who presents today for mood follow-up. GAD7 completed with score of 2. PHQ9 completed with score of 2. Denies SI/HI. Improvement in both scores from last visit. Patient reports she has noticed an improvement in her anxiety and depression. Discussed options with patient, and she is agreeable to increase Celexa from 10mg  to 20mg  daily. Updated prescription sent to pharmacy.  - citalopram (CELEXA) 20 MG tablet; Take 1 tablet (20 mg total) by mouth daily.  Dispense: 90 tablet; Refill: 3  2. Depression, major, single  episode, moderate (HCC) See #1 - citalopram (CELEXA) 20 MG tablet; Take 1 tablet (20 mg total) by mouth daily.  Dispense: 90 tablet; Refill: 3  3. Tenderness over maxillary sinus Patient reports cough, nasal congestion/pressure, ear pressure, and sinus pressure. Physical exam with TM fullness, no erythema or infection present. Maxillary sinus tenderness present to palpation. Lungs clear to auscultation bilaterally with no adventitious lung sounds. Will treat for acute sinusitis. Advised patient to return to office if she does notice an improvement in symptoms.  - amoxicillin-clavulanate (AUGMENTIN) 875-125 MG tablet; Take 1 tablet by mouth 2 (two) times daily for 5 days.  Dispense: 10 tablet; Refill: 0     Return in about 6 weeks (around 11/16/2023) for Mood f/u.    Alyson Reedy, FNP

## 2023-10-05 NOTE — Patient Instructions (Signed)

## 2023-10-18 ENCOUNTER — Other Ambulatory Visit (HOSPITAL_BASED_OUTPATIENT_CLINIC_OR_DEPARTMENT_OTHER): Payer: Self-pay | Admitting: Family Medicine

## 2023-10-19 MED ORDER — ORPHENADRINE CITRATE ER 100 MG PO TB12: 100.0000 mg | ORAL_TABLET | Freq: Two times a day (BID) | ORAL | 0 refills | Status: DC | PRN

## 2023-10-19 MED ORDER — TOPIRAMATE 25 MG PO TABS: 50.0000 mg | ORAL_TABLET | Freq: Two times a day (BID) | ORAL | 0 refills | Status: DC

## 2023-11-16 ENCOUNTER — Encounter (HOSPITAL_BASED_OUTPATIENT_CLINIC_OR_DEPARTMENT_OTHER): Payer: Self-pay | Admitting: Family Medicine

## 2023-12-12 ENCOUNTER — Other Ambulatory Visit (HOSPITAL_BASED_OUTPATIENT_CLINIC_OR_DEPARTMENT_OTHER): Payer: Self-pay | Admitting: Family Medicine

## 2023-12-16 ENCOUNTER — Other Ambulatory Visit (HOSPITAL_BASED_OUTPATIENT_CLINIC_OR_DEPARTMENT_OTHER): Payer: Self-pay | Admitting: Family Medicine

## 2023-12-22 ENCOUNTER — Encounter (HOSPITAL_BASED_OUTPATIENT_CLINIC_OR_DEPARTMENT_OTHER): Payer: Self-pay | Admitting: Family Medicine

## 2023-12-22 ENCOUNTER — Ambulatory Visit (HOSPITAL_BASED_OUTPATIENT_CLINIC_OR_DEPARTMENT_OTHER): Payer: Commercial Managed Care - HMO | Admitting: Family Medicine

## 2023-12-22 VITALS — BP 99/67 | HR 73 | Ht 63.0 in | Wt 178.4 lb

## 2023-12-22 DIAGNOSIS — F411 Generalized anxiety disorder: Secondary | ICD-10-CM

## 2023-12-22 DIAGNOSIS — Z23 Encounter for immunization: Secondary | ICD-10-CM

## 2023-12-22 DIAGNOSIS — G43709 Chronic migraine without aura, not intractable, without status migrainosus: Secondary | ICD-10-CM

## 2023-12-22 DIAGNOSIS — N182 Chronic kidney disease, stage 2 (mild): Secondary | ICD-10-CM

## 2023-12-22 DIAGNOSIS — Z8679 Personal history of other diseases of the circulatory system: Secondary | ICD-10-CM | POA: Diagnosis not present

## 2023-12-22 MED ORDER — TOPIRAMATE 50 MG PO TABS: 50.0000 mg | ORAL_TABLET | Freq: Two times a day (BID) | ORAL | 3 refills | Status: DC

## 2023-12-22 NOTE — Progress Notes (Signed)
Subjective:   Hannah Lynch [facility] 09-Jun-1971 12/22/2023  Chief Complaint  Patient presents with   Medical Management of Chronic Issues    Patient is here today for a follow up. Denies any concerns for today's visit.    HPI: Hannah Lynch presents today for re-assessment and management of chronic medical conditions.  ANXIETY: Hannah Lynch presents for the medical management of anxiety.  Current medication regimen: Celexa 20mg  (increased from 10mg  approx. 2 months ago)  Counseling: Recommended Well controlled: Yes, doing well for anxiety and depression. Reports significant improvement in symptoms. She has noticed some weight gain but is not concerned about side effects.  Denies SI/HI.     12/22/2023    8:11 AM 10/05/2023    9:54 AM 08/24/2023    9:30 AM 05/20/2023    9:44 AM  GAD 7 : Generalized Anxiety Score  Nervous, Anxious, on Edge 0 1 1 1   Control/stop worrying 0 0 2 1  Worry too much - different things 0 1 2 2   Trouble relaxing 0 0 1 0  Restless 0 0 0 0  Easily annoyed or irritable 0 0 0 0  Afraid - awful might happen 0 0 0 0  Total GAD 7 Score 0 2 6 4   Anxiety Difficulty Not difficult at all Not difficult at all Somewhat difficult Somewhat difficult      12/22/2023    8:11 AM 10/05/2023    9:52 AM 08/24/2023    9:30 AM  PHQ9 SCORE ONLY  PHQ-9 Total Score 0 2 7    CHRONIC KIDNEY DISEASE Stage 2: Hannah Lynch presents for the medical management of Chronic Kidney Disease. Stable for the past year per chart review.  Patient is  adhering to renal diet. She does not like water and does not drink a lot of clear fluids throughout the day.  Patient is not on ACE1/ARB therapy.  Patient is  avoiding NSAIDS.  She uses Voltaren PO only as needed for arthritis pain in joints.     Lab Results  Component Value Date   NA 139 08/24/2023   K 3.5 08/24/2023   CO2 23 08/24/2023   GLUCOSE 82 08/24/2023   BUN 10 08/24/2023   CREATININE 1.03 (H) 08/24/2023   CALCIUM  9.0 08/24/2023   EGFR 66 08/24/2023   GFRNONAA >60 04/21/2023     Sinus Tachycardia:  Patient reported hx of inappropriate sinus tachycardia per Cardiology and treated with Metoprolol 25mg  XR daily. Patient reports control of palpitations and chest pain currently.  She denies dizziness, headaches, or palpitations.   BP Readings from Last 3 Encounters:  12/22/23 99/67  10/05/23 95/70  08/24/23 102/78    MIGRAINE:   Patient reports chronic migraines controlled well with Topamax currently. Denies increase in frequency or severity currently. Uses OTC as needed for breakthrough migraines. Denies need of change in migraine medication as this keeps frequency well controlled per patient.  Frequency: intermittent Headache status at time of visit: asymptomatic   The following portions of the patient's history were reviewed and updated as appropriate: past medical history, past surgical history, family history, social history, allergies, medications, and problem list.   Patient Active Problem List   Diagnosis Date Noted   Hx of sinus tachycardia 12/22/2023   CKD (chronic kidney disease) stage 2, GFR 60-89 ml/min 12/22/2023   Wellness examination 08/24/2023   Anxiety 08/24/2023   Depression, major, single episode, moderate (HCC) 08/24/2023   Screening for cervical cancer 08/24/2023  DDD (degenerative disc disease), cervical 05/20/2023   Chronic neck and back pain 05/20/2023   Arthritis of right acromioclavicular joint 05/10/2023   Bursitis of right shoulder 05/10/2023   Palpitations 11/26/2022   Impingement syndrome of right shoulder 08/20/2022   Lumbar radiculopathy 05/02/2021   History of left heart catheterization 10/16/2020   Trochanteric bursitis, left hip 08/30/2020   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 08/30/2020   Migraine without status migrainosus, not intractable 11/15/2019   GERD (gastroesophageal reflux disease) 09/27/2019   Essential hypertension  09/27/2019   Fatty liver 09/27/2019   Bilateral sensorineural hearing loss 07/14/2019   Arthropathy of cervical facet joint 08/19/2018   Lumbar facet joint pain 08/19/2018   History of staph infection 07/20/2018   Tonsillar hypertrophy 05/06/2018   Chronic cholecystitis    Hypothyroidism 12/24/2017   Nasal septal deviation 10/22/2017   Nasal turbinate hypertrophy 10/22/2017   Snoring 10/22/2017   Primary insomnia 06/25/2017   Papanicolaou smear for cervical cancer screening 06/25/2017   Bilateral hearing loss 06/25/2017   Acute serous otitis media 06/25/2017   Lumbar stenosis with neurogenic claudication 09/25/2014   Past Medical History:  Diagnosis Date   Anxiety    Degenerative disc disease, lumbar    Degenerative disc disease, lumbar 2012   lumbar and cervical region   Depression    Endometriosis    Family history of adverse reaction to anesthesia    pt's mother has hx. of post-op N/V   Headache    History of hiatal hernia    Hypertension    Hypothyroidism    IBS (irritable bowel syndrome)    Nasal septal deviation 11/2017   Nasal turbinate hypertrophy 11/2017   PONV (postoperative nausea and vomiting)    Snoring 11/2017   Past Surgical History:  Procedure Laterality Date   ABDOMINAL HYSTERECTOMY  2005   partial - TAH, no cervix, has both ovaries; done for endometriosis   ANTERIOR CERVICAL DECOMP/DISCECTOMY FUSION  03/25/2007   C4-5   CERVICAL FUSION  2013   CHOLECYSTECTOMY N/A 03/26/2018   Procedure: LAPAROSCOPIC CHOLECYSTECTOMY;  Surgeon: Franky Macho, MD;  Location: [facility];  Service: General;  Laterality: N/A;   COLONOSCOPY     HIATAL HERNIA REPAIR     completed with gastric sleeve   KNEE ARTHROSCOPY Left    LAPAROSCOPIC GASTRIC SLEEVE RESECTION N/A 09/27/2019   Procedure: LAPAROSCOPIC GASTRIC SLEEVE RESECTION, HIATAL HERNIA REPARI ;Upper Endo, Eras Pathway;  Surgeon: Gaynelle Adu, MD;  Location: [facility];  Service: General;  Laterality: N/A;   LUMBAR  LAMINECTOMY/DECOMPRESSION MICRODISCECTOMY  11/21/2004   L4-5   MAXIMUM ACCESS (MAS)POSTERIOR LUMBAR INTERBODY FUSION (PLIF) 1 LEVEL N/A 09/25/2014   Procedure: Lumbar four-five MAXIMUM ACCESS (MAS) POSTERIOR LUMBAR INTERBODY FUSION (PLIF) 1 LEVEL;  Surgeon: Maeola Harman, MD;  Location: [facility];  Service: Neurosurgery;  Laterality: N/A;   NASAL SEPTOPLASTY W/ TURBINOPLASTY N/A 12/07/2017   Procedure: NASAL SEPTOPLASTY WITH BILATERAL TURBINATE REDUCTION;  Surgeon: Christia Reading, MD;  Location: [facility];  Service: ENT;  Laterality: N/A;   ORIF ANKLE FRACTURE Right    TONSILLECTOMY  06/2018   UPPER GI ENDOSCOPY     Family History  Problem Relation Age of Onset   Hypertension Mother    Diabetes Mother    Heart failure Father    Hypertension Father    Diabetes Father    Heart failure Other    Breast cancer Paternal Grandmother    Outpatient Medications Prior to Visit  Medication Sig Dispense Refill   citalopram (CELEXA) 20 MG tablet Take 1 tablet (20 mg total) by mouth daily. 90 tablet 3   diclofenac (VOLTAREN) 75 MG EC tablet Take 1 tablet (75 mg total) by mouth 2 (two) times daily. 60 tablet 1   docusate sodium (COLACE) 100 MG capsule Take 100 mg by mouth daily.     levothyroxine (SYNTHROID) 25 MCG tablet Take 1 tablet (25 mcg total) by mouth daily. 90 tablet 2   metoprolol succinate (TOPROL-XL) 25 MG 24 hr tablet Take 1 tablet (25 mg total) by mouth daily. 90 tablet 3   montelukast (SINGULAIR) 10 MG tablet Take 1 tablet (10 mg total) by mouth daily. 90 tablet 2   Multiple Vitamin (MULTIVITAMIN) tablet Take 1 tablet by mouth daily.     orphenadrine (NORFLEX) 100 MG tablet Take 1 tablet (100 mg total) by mouth 2 (two) times daily as needed for muscle spasms. 60 tablet 0   traZODone (DESYREL) 150 MG tablet Take 1 tablet (150 mg total) by mouth at bedtime. 90 tablet 2   topiramate (TOPAMAX) 25 MG tablet Take 2 tablets (50 mg total) by mouth 2 (two) times daily. 120  tablet 0   No facility-administered medications prior to visit.   No Known Allergies   ROS: A complete ROS was performed with pertinent positives/negatives noted in the HPI. The remainder of the ROS are negative.    Objective:   Today's Vitals   12/22/23 0805  BP: 99/67  Pulse: 73  SpO2: 100%  Weight: 178 lb 6.4 oz (80.9 kg)  Height: 5\' 3"  (1.6 m)    Physical Exam          GENERAL: Well-appearing, in NAD. Well nourished.  SKIN: Pink, warm and dry.  Head: Normocephalic. NECK: Trachea midline. Full ROM w/o pain or tenderness.   RESPIRATORY: Chest wall symmetrical. Respirations even and non-labored. Breath sounds clear to auscultation bilaterally.  CARDIAC: S1, S2 present, regular rate and rhythm without murmur or gallops. Peripheral pulses 2+ bilaterally.  MSK: Muscle tone and strength appropriate for age.  NEUROLOGIC: No motor or sensory deficits. Steady, even gait. C2-C12 intact.  PSYCH/MENTAL STATUS: Alert, oriented x 3. Cooperative, appropriate mood and affect.   Health Maintenance Due  Topic Date Due   Zoster Vaccines- Shingrix (2 of 2) 12/14/2023      Assessment & Plan:  1. Chronic migraine without aura without status migrainosus, not intractable (Primary) Well controlled. Topamax refilled for patient. Will continue to monitor migraine frequency and severity.  - topiramate (TOPAMAX) 50 MG tablet; Take 1 tablet (50 mg total) by mouth 2 (two) times daily.  Dispense: 180 tablet; Refill: 3  2. CKD (chronic kidney disease) stage 2, GFR 60-89 ml/min Will repeat BMP in approx. 3 months. Recommend patient increase clear fluids, avoid NSAID use and mointor protein intake. Pt agreeable and will return for lab only visit in 3 months for surveillance.  - Basic Metabolic Panel (BMET); Future  3. Immunization due - Tdap vaccine greater than or equal to 7yo IM  4. Hx of sinus tachycardia Well controlled currenlty. Continue metoprolol 25mg  XL daily. Monitor BP and HR at home.    5. GAD (generalized anxiety disorder) Improved and well controlled. Safe use of medication reviewed with patient and safety plan reviewed.    Meds ordered this encounter  Medications   topiramate (TOPAMAX) 50 MG tablet    Sig: Take 1 tablet (50 mg total) by mouth 2 (two) times daily.  Dispense:  180 tablet    Refill:  3    Supervising Provider:   DE Peru, RAYMOND J [[id number]]   Lab Orders         Basic Metabolic Panel (BMET)      Return for Lab Visit 3 months and AE in 9 months (fasting labs) .    Patient to reach out to office if new, worrisome, or unresolved symptoms arise or if no improvement in patient's condition. Patient verbalized understanding and is agreeable to treatment plan. All questions answered to patient's satisfaction.    Hilbert Bible, [place]

## 2023-12-22 NOTE — Patient Instructions (Signed)
Please make lab appt in 3 months for check of your kidney function.

## 2024-01-13 ENCOUNTER — Encounter (HOSPITAL_BASED_OUTPATIENT_CLINIC_OR_DEPARTMENT_OTHER): Payer: Self-pay | Admitting: Family Medicine

## 2024-01-13 NOTE — Telephone Encounter (Signed)
 Jon Gills, please see mychart sent by pt and advise.

## 2024-01-14 ENCOUNTER — Other Ambulatory Visit (HOSPITAL_BASED_OUTPATIENT_CLINIC_OR_DEPARTMENT_OTHER): Payer: Self-pay | Admitting: Family Medicine

## 2024-01-14 DIAGNOSIS — G43709 Chronic migraine without aura, not intractable, without status migrainosus: Secondary | ICD-10-CM

## 2024-01-14 MED ORDER — NURTEC 75 MG PO TBDP: 1.0000 | ORAL_TABLET | ORAL | 1 refills | Status: AC | PRN

## 2024-01-14 MED ORDER — DICLOFENAC SODIUM 75 MG PO TBEC: 75.0000 mg | DELAYED_RELEASE_TABLET | ORAL | Status: DC | PRN

## 2024-01-18 ENCOUNTER — Other Ambulatory Visit (INDEPENDENT_AMBULATORY_CARE_PROVIDER_SITE_OTHER): Payer: Self-pay

## 2024-01-18 ENCOUNTER — Ambulatory Visit: Admitting: Surgical

## 2024-01-18 ENCOUNTER — Encounter: Payer: Self-pay | Admitting: Surgical

## 2024-01-18 DIAGNOSIS — M25561 Pain in right knee: Secondary | ICD-10-CM

## 2024-01-18 DIAGNOSIS — M65961 Unspecified synovitis and tenosynovitis, right lower leg: Secondary | ICD-10-CM

## 2024-01-18 MED ORDER — LIDOCAINE HCL 1 % IJ SOLN
5.0000 mL | INTRAMUSCULAR | Status: AC | PRN
  Administered 2024-01-18: 5 mL

## 2024-01-18 MED ORDER — BUPIVACAINE HCL 0.25 % IJ SOLN
4.0000 mL | INTRAMUSCULAR | Status: AC | PRN
  Administered 2024-01-18: 4 mL via INTRA_ARTICULAR

## 2024-01-18 MED ORDER — METHYLPREDNISOLONE ACETATE 40 MG/ML IJ SUSP
40.0000 mg | INTRAMUSCULAR | Status: AC | PRN
  Administered 2024-01-18: 40 mg via INTRA_ARTICULAR

## 2024-01-18 NOTE — Progress Notes (Signed)
 Office Visit Note   Patient: Hannah Lynch           Date of Birth: 1971-09-19           MRN: [id number] Visit Date: 01/18/2024 Requested by: Hilbert Bible, FNP [address] [place],  [place] [address] PCP: Hilbert Bible, FNP  Subjective: Chief Complaint  Patient presents with   Right Knee - Pain    HPI: Hannah Lynch is a 53 y.o. female who presents to the office reporting right knee pain.  Patient orts over the last 2 weeks she has had right knee pain without history of injury.  It will occasionally wake her up from sleep at night.  She has to keep her knee moving in order to help with her pain.  Describes global knee pain without radiation.  No groin pain.  No radicular pain or numbness/tingling.  She has no history of prior surgery to her right knee.  No fevers or chills.  Does have occasional popping sensation.  Will also have subjective instability, primarily with climbing stairs..                ROS: All systems reviewed are negative as they relate to the chief complaint within the history of present illness.  Patient denies fevers or chills.  Assessment & Plan: Visit Diagnoses:  1. Synovitis of right knee   2. Right knee pain, unspecified chronicity     Plan: Patient is a 53 year old female who presents for evaluation of right knee pain.  Has radiographs taken today demonstrating no significant abnormality but she does have moderate amount of pain and joint line tenderness.  After discussion of options, she would like to try cortisone injection.  We will see how this does for her and have her follow-up in 4 weeks for clinical recheck.  If no improvement or limited improvement at that time, next step would be MRI for evaluation of meniscal pathology.  Follow-Up Instructions: No follow-ups on file.   Orders:  Orders Placed This Encounter  Procedures   XR KNEE 3 VIEW RIGHT   No orders of the defined types were placed in this  encounter.     Procedures: Large Joint Inj: R knee on 01/18/2024 4:41 PM Indications: diagnostic evaluation, joint swelling and pain Details: 18 G 1.5 in needle, superolateral approach  Arthrogram: No  Medications: 5 mL lidocaine 1 %; 40 mg methylPREDNISolone acetate 40 MG/ML; 4 mL bupivacaine 0.25 % Outcome: tolerated well, no immediate complications Procedure, treatment alternatives, risks and benefits explained, specific risks discussed. Consent was given by the patient. Immediately prior to procedure a time out was called to verify the correct patient, procedure, equipment, support staff and site/side marked as required. Patient was prepped and draped in the usual sterile fashion.       Clinical Data: No additional findings.  Objective: Vital Signs: There were no vitals taken for this visit.  Physical Exam:  Constitutional: Patient appears well-developed HEENT:  Head: Normocephalic Eyes:EOM are normal Neck: Normal range of motion Cardiovascular: Normal rate Pulmonary/chest: Effort normal Neurologic: Patient is alert Skin: Skin is warm Psychiatric: Patient has normal mood and affect  Ortho Exam: Ortho exam demonstrates right knee with no effusion.  Tenderness moderately over the medial joint line and mildly over the lateral joint line.  No significant crepitus aside from mild patellofemoral crepitus noted.  He has range of motion from 0 degrees extension to 120 degrees knee flexion.  No calf tenderness.  Negative Homans' sign.  Intact ankle dorsiflexion plantarflexion.  Excellent quad strength rated 5/5.  No pain with hip range of motion.  Stable to anterior posterior drawer sign.  Stable to varus and valgus stress at 0 and 30 degrees.  No cellulitis or skin changes noted.  Specialty Comments:  No specialty comments available.  Imaging: No results found.   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 08/30/2020   Migraine without status migrainosus, not intractable 11/15/2019   GERD (gastroesophageal reflux disease) 09/27/2019   Essential hypertension 09/27/2019   Fatty liver 09/27/2019   Bilateral sensorineural hearing loss 07/14/2019   Arthropathy of cervical facet joint 08/19/2018   Lumbar facet joint pain 08/19/2018   History of staph infection 07/20/2018   Tonsillar hypertrophy 05/06/2018   Chronic cholecystitis    Hypothyroidism 12/24/2017   Nasal septal deviation 10/22/2017   Nasal turbinate hypertrophy 10/22/2017   Snoring 10/22/2017   Primary insomnia 06/25/2017   Bilateral hearing loss 06/25/2017   Lumbar stenosis with neurogenic claudication 09/25/2014   Past Medical History:  Diagnosis Date   Anxiety    Degenerative disc disease, lumbar    Degenerative disc disease, lumbar 2012   lumbar and cervical region   Depression    Depression, major, single episode, moderate (HCC) 08/24/2023   Endometriosis    Family history of adverse reaction to anesthesia    pt's mother has hx. of post-op N/V   Headache    History of hiatal hernia    Hypertension    Hypothyroidism    IBS (irritable bowel syndrome)    Impingement syndrome of right shoulder 08/20/2022   Nasal septal deviation 11/2017   Nasal turbinate hypertrophy 11/2017   PONV (postoperative nausea and vomiting)    Snoring 11/2017   Trochanteric bursitis, left hip 08/30/2020    Family History  Problem Relation Age of Onset   Hypertension Mother    Diabetes Mother     Heart failure Father    Hypertension Father    Diabetes Father    Heart failure Other    Breast cancer Paternal Grandmother     Past Surgical History:  Procedure Laterality Date   ABDOMINAL HYSTERECTOMY  2005   partial - TAH, no cervix, has both ovaries; done for endometriosis   ANTERIOR CERVICAL DECOMP/DISCECTOMY FUSION  03/25/2007   C4-5   CERVICAL FUSION  2013   CHOLECYSTECTOMY N/A 03/26/2018   Procedure: LAPAROSCOPIC CHOLECYSTECTOMY;  Surgeon: Franky Macho, MD;  Location: [facility];  Service: General;  Laterality: N/A;   COLONOSCOPY     HIATAL HERNIA REPAIR     completed with gastric sleeve   KNEE ARTHROSCOPY Left    LAPAROSCOPIC GASTRIC SLEEVE RESECTION N/A 09/27/2019   Procedure: LAPAROSCOPIC GASTRIC SLEEVE RESECTION, HIATAL HERNIA REPARI ;Upper Endo, Eras Pathway;  Surgeon: Gaynelle Adu, MD;  Location: [facility];  Service: General;  Laterality: N/A;   LUMBAR LAMINECTOMY/DECOMPRESSION MICRODISCECTOMY  11/21/2004   L4-5   MAXIMUM ACCESS (MAS)POSTERIOR LUMBAR INTERBODY FUSION (PLIF) 1 LEVEL  N/A 09/25/2014   Procedure: Lumbar four-five MAXIMUM ACCESS (MAS) POSTERIOR LUMBAR INTERBODY FUSION (PLIF) 1 LEVEL;  Surgeon: Maeola Harman, MD;  Location: [facility];  Service: Neurosurgery;  Laterality: N/A;   NASAL SEPTOPLASTY W/ TURBINOPLASTY N/A 12/07/2017   Procedure: NASAL SEPTOPLASTY WITH BILATERAL TURBINATE REDUCTION;  Surgeon: Christia Reading, MD;  Location: [facility];  Service: ENT;  Laterality: N/A;   ORIF ANKLE FRACTURE Right    TONSILLECTOMY  06/2018   UPPER GI ENDOSCOPY     Social History   Occupational History   Not on file  Tobacco Use   Smoking status: Never   Smokeless tobacco: Never  Vaping Use   Vaping status: Never Used  Substance and Sexual Activity   Alcohol use: Yes    Comment: once in awhile   Drug use: Never   Sexual activity: Yes    Birth control/protection: Surgical    Comment: hyst

## 2024-02-15 ENCOUNTER — Ambulatory Visit: Admitting: Surgical

## 2024-03-14 ENCOUNTER — Ambulatory Visit (INDEPENDENT_AMBULATORY_CARE_PROVIDER_SITE_OTHER): Admitting: Surgical

## 2024-03-14 ENCOUNTER — Other Ambulatory Visit (INDEPENDENT_AMBULATORY_CARE_PROVIDER_SITE_OTHER): Payer: Self-pay

## 2024-03-14 ENCOUNTER — Telehealth: Payer: Self-pay | Admitting: Radiology

## 2024-03-14 DIAGNOSIS — M25561 Pain in right knee: Secondary | ICD-10-CM | POA: Diagnosis not present

## 2024-03-14 DIAGNOSIS — G8929 Other chronic pain: Secondary | ICD-10-CM | POA: Diagnosis not present

## 2024-03-14 DIAGNOSIS — M1712 Unilateral primary osteoarthritis, left knee: Secondary | ICD-10-CM

## 2024-03-14 NOTE — Telephone Encounter (Signed)
 Please obtain gel authorization left knee-Luke

## 2024-03-15 ENCOUNTER — Other Ambulatory Visit: Payer: Self-pay | Admitting: Family Medicine

## 2024-03-15 DIAGNOSIS — Z1231 Encounter for screening mammogram for malignant neoplasm of breast: Secondary | ICD-10-CM

## 2024-03-16 ENCOUNTER — Encounter: Payer: Self-pay | Admitting: Surgical

## 2024-03-16 NOTE — Progress Notes (Signed)
 Office Visit Note   Patient: Hannah Lynch           Date of Birth: Apr 27, 1971           MRN: [id number] Visit Date: 03/14/2024 Requested by: Nonda Bays, FNP [address] [place],  [place] [address] PCP: Nonda Bays, FNP  Subjective: Chief Complaint  Patient presents with   Right Knee - Pain   Left Knee - Pain    HPI: Hannah Lynch is a 53 y.o. female who presents to the office reporting bilateral knee pain.  Had right knee injection on 01/18/2024 that only lasted for about 1.5 weeks.  She describes primarily lateral pain and pain around the superior aspect of the patella.  She feels pain is getting worse.  Feels like the knee wants to buckle on her.  Not taking any medications for pain.  Left knee actually has started to bother her and bothers her more than the right knee.  She has had 2 prior arthroscopies with Dr. Aviva Lemmings on the left knee with no prior right knee surgery..                ROS: All systems reviewed are negative as they relate to the chief complaint within the history of present illness.  Patient denies fevers or chills.  Assessment & Plan: Visit Diagnoses:  1. Arthritis of left knee   2. Chronic pain of right knee     Plan: Patient is a 53 year old female who presents for evaluation of bilateral knee pain.  Right knee pain has returned after injection about 6 to 8 weeks ago.  With lack of substantial improvement, and negative radiographs, need MRI of the right knee to evaluate for meniscus tear.  She has never had right knee surgery but has had left knee surgery with 2 prior meniscal debridements on that knee.  Her left knee is actually more painful for her recently.  She does have some mild degenerative changes noted in the lateral compartment with increased degenerative changes noted on prior MRI scan and prior arthroscopy according to her report from Dr. Aviva Lemmings.  We will plan to preapproved her for a gel injection in  the left knee.  Follow-up after MRI of the right knee.  This patient is diagnosed with osteoarthritis of the knee(s).    Radiographs show evidence of joint space narrowing, osteophytes, subchondral sclerosis and/or subchondral cysts.  This patient has knee pain which interferes with functional and activities of daily living.    This patient has experienced inadequate response, adverse effects and/or intolerance with conservative treatments such as acetaminophen , NSAIDS, topical creams, physical therapy or regular exercise, knee bracing and/or weight loss.   This patient has experienced inadequate response or has a contraindication to intra articular steroid injections for at least 3 months.   This patient is not scheduled to have a total knee replacement within 6 months of starting treatment with viscosupplementation.   Follow-Up Instructions: No follow-ups on file.   Orders:  Orders Placed This Encounter  Procedures   XR Knee Complete 4 Views Left   MR Knee Right w/o contrast   No orders of the defined types were placed in this encounter.     Procedures: No procedures performed   Clinical Data: No additional findings.  Objective: Vital Signs: There were no vitals taken for this visit.  Physical Exam:  Constitutional: Patient appears well-developed HEENT:  Head: Normocephalic Eyes:EOM are normal Neck: Normal range of motion  Cardiovascular: Normal rate Pulmonary/chest: Effort normal Neurologic: Patient is alert Skin: Skin is warm Psychiatric: Patient has normal mood and affect  Ortho Exam: Ortho exam demonstrates right knee with small effusion.  No tenderness over the medial joint line but does have tenderness over the lateral joint line moderately.  No pain with hip range of motion.  Able to perform straight leg raise without extensor lag.  No pain with hip range of motion.  No cellulitis or skin changes noted.  Left knee with trace effusion.  Tenderness over the medial  joint line mildly and lateral joint line moderate to severely.  She has no pain with hip range of motion.  Stable to anterior and posterior drawer sign.  Specialty Comments:  No specialty comments available.  Imaging: No results found.   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 08/30/2020   Migraine without status migrainosus, not intractable 11/15/2019   GERD (gastroesophageal reflux disease) 09/27/2019   Essential hypertension 09/27/2019   Fatty liver 09/27/2019   Bilateral sensorineural hearing loss 07/14/2019   Arthropathy of cervical facet joint 08/19/2018   Lumbar facet joint pain 08/19/2018   History of staph infection 07/20/2018   Tonsillar hypertrophy 05/06/2018   Chronic cholecystitis    Hypothyroidism 12/24/2017   Nasal septal deviation 10/22/2017   Nasal turbinate hypertrophy 10/22/2017   Snoring 10/22/2017   Primary insomnia 06/25/2017   Bilateral hearing loss 06/25/2017   Lumbar stenosis with neurogenic claudication 09/25/2014   Past Medical History:  Diagnosis Date   Anxiety    Degenerative disc disease, lumbar    Degenerative disc disease, lumbar 2012   lumbar and cervical region   Depression    Depression, major, single episode, moderate (HCC) 08/24/2023   Endometriosis    Family history of adverse reaction to anesthesia    pt's mother has hx. of post-op N/V   Headache    History of hiatal hernia    Hypertension    Hypothyroidism    IBS  (irritable bowel syndrome)    Impingement syndrome of right shoulder 08/20/2022   Nasal septal deviation 11/2017   Nasal turbinate hypertrophy 11/2017   PONV (postoperative nausea and vomiting)    Snoring 11/2017   Trochanteric bursitis, left hip 08/30/2020    Family History  Problem Relation Age of Onset   Hypertension Mother    Diabetes Mother    Heart failure Father    Hypertension Father    Diabetes Father    Heart failure Other    Breast cancer Paternal Grandmother     Past Surgical History:  Procedure Laterality Date   ABDOMINAL HYSTERECTOMY  2005   partial - TAH, no cervix, has both ovaries; done for endometriosis   ANTERIOR CERVICAL DECOMP/DISCECTOMY FUSION  03/25/2007   C4-5   CERVICAL FUSION  2013   CHOLECYSTECTOMY N/A 03/26/2018   Procedure: LAPAROSCOPIC CHOLECYSTECTOMY;  Surgeon: Alanda Allegra, MD;  Location: [facility];  Service: General;  Laterality: N/A;   COLONOSCOPY     HIATAL  HERNIA REPAIR     completed with gastric sleeve   KNEE ARTHROSCOPY Left    LAPAROSCOPIC GASTRIC SLEEVE RESECTION N/A 09/27/2019   Procedure: LAPAROSCOPIC GASTRIC SLEEVE RESECTION, HIATAL HERNIA REPARI ;Upper Endo, Eras Pathway;  Surgeon: Aldean Hummingbird, MD;  Location: [facility];  Service: General;  Laterality: N/A;   LUMBAR LAMINECTOMY/DECOMPRESSION MICRODISCECTOMY  11/21/2004   L4-5   MAXIMUM ACCESS (MAS)POSTERIOR LUMBAR INTERBODY FUSION (PLIF) 1 LEVEL N/A 09/25/2014   Procedure: Lumbar four-five MAXIMUM ACCESS (MAS) POSTERIOR LUMBAR INTERBODY FUSION (PLIF) 1 LEVEL;  Surgeon: Manya Sells, MD;  Location: [facility];  Service: Neurosurgery;  Laterality: N/A;   NASAL SEPTOPLASTY W/ TURBINOPLASTY N/A 12/07/2017   Procedure: NASAL SEPTOPLASTY WITH BILATERAL TURBINATE REDUCTION;  Surgeon: Virgina Grills, MD;  Location: [facility];  Service: ENT;  Laterality: N/A;   ORIF ANKLE FRACTURE Right    TONSILLECTOMY  06/2018   UPPER GI ENDOSCOPY     Social History   Occupational History    Not on file  Tobacco Use   Smoking status: Never   Smokeless tobacco: Never  Vaping Use   Vaping status: Never Used  Substance and Sexual Activity   Alcohol use: Yes    Comment: once in awhile   Drug use: Never   Sexual activity: Yes    Birth control/protection: Surgical    Comment: hyst

## 2024-03-16 NOTE — Telephone Encounter (Signed)
 Called and left VM advising patient that Medicaid does not cover gel injections.  Advised of [employer] and to call back to discuss further if she had any questions.

## 2024-03-21 ENCOUNTER — Other Ambulatory Visit (HOSPITAL_BASED_OUTPATIENT_CLINIC_OR_DEPARTMENT_OTHER): Payer: Commercial Managed Care - HMO

## 2024-03-21 DIAGNOSIS — N182 Chronic kidney disease, stage 2 (mild): Secondary | ICD-10-CM

## 2024-03-22 LAB — BASIC METABOLIC PANEL WITH GFR
BUN/Creatinine Ratio: 8 — ABNORMAL LOW (ref 9–23)
BUN: 9 mg/dL (ref 6–24)
CO2: 20 mmol/L (ref 20–29)
Calcium: 9.6 mg/dL (ref 8.7–10.2)
Chloride: 106 mmol/L (ref 96–106)
Creatinine, Ser: 1.07 mg/dL — ABNORMAL HIGH (ref 0.57–1.00)
Glucose: 121 mg/dL — ABNORMAL HIGH (ref 70–99)
Potassium: 4.2 mmol/L (ref 3.5–5.2)
Sodium: 142 mmol/L (ref 134–144)
eGFR: 63 mL/min/{1.73_m2} (ref >59)

## 2024-03-23 ENCOUNTER — Ambulatory Visit (HOSPITAL_BASED_OUTPATIENT_CLINIC_OR_DEPARTMENT_OTHER): Payer: Self-pay | Admitting: Family Medicine

## 2024-03-23 ENCOUNTER — Other Ambulatory Visit

## 2024-03-23 ENCOUNTER — Encounter (HOSPITAL_BASED_OUTPATIENT_CLINIC_OR_DEPARTMENT_OTHER): Payer: Self-pay | Admitting: Family Medicine

## 2024-03-23 NOTE — Progress Notes (Signed)
 Hi Hannah Lynch, Your kidney function is stable.  Please continue to avoid taking the diclofenac  unless absolutely needed for joint pain.  Your glucose level was slightly elevated which has been chronic, but at last check your A1c this was normal.  Your electrolytes are also normal.  Please continue drinking clear fluids, avoiding NSAIDs, and monitoring your protein intake.  Will recheck this at your next appointment in November.

## 2024-03-24 ENCOUNTER — Telehealth: Payer: Self-pay | Admitting: Surgical

## 2024-03-24 NOTE — Telephone Encounter (Signed)
 Called pt left vm for pt to call and set an MRI review appt with [facility] 2 wks after 5/22

## 2024-03-31 ENCOUNTER — Ambulatory Visit
Admission: RE | Admit: 2024-03-31 | Discharge: 2024-03-31 | Disposition: A | Discharge status: Home / Self Care | Source: Ambulatory Visit | Attending: Family Medicine | Admitting: Family Medicine

## 2024-03-31 DIAGNOSIS — Z1231 Encounter for screening mammogram for malignant neoplasm of breast: Secondary | ICD-10-CM

## 2024-04-06 ENCOUNTER — Ambulatory Visit
Admission: RE | Admit: 2024-04-06 | Discharge: 2024-04-06 | Disposition: A | Discharge status: Home / Self Care | Source: Ambulatory Visit | Attending: Surgical | Admitting: Surgical

## 2024-04-06 ENCOUNTER — Ambulatory Visit (HOSPITAL_BASED_OUTPATIENT_CLINIC_OR_DEPARTMENT_OTHER): Payer: Self-pay | Admitting: Family Medicine

## 2024-04-06 DIAGNOSIS — G8929 Other chronic pain: Secondary | ICD-10-CM

## 2024-04-06 NOTE — Progress Notes (Signed)
Hannah Lynch,   Your mammogram results show no evidence of breast cancer. We will plan to repeat this in 1 year for routine screening.  If you should have concerns or changes in your breasts within the next year, please let us know.   Jerre Simon, FNP-C

## 2024-04-14 ENCOUNTER — Ambulatory Visit (INDEPENDENT_AMBULATORY_CARE_PROVIDER_SITE_OTHER): Admitting: Surgical

## 2024-04-14 DIAGNOSIS — G8929 Other chronic pain: Secondary | ICD-10-CM | POA: Diagnosis not present

## 2024-04-14 DIAGNOSIS — M1712 Unilateral primary osteoarthritis, left knee: Secondary | ICD-10-CM | POA: Diagnosis not present

## 2024-04-14 DIAGNOSIS — M25561 Pain in right knee: Secondary | ICD-10-CM | POA: Diagnosis not present

## 2024-04-15 ENCOUNTER — Encounter: Payer: Self-pay | Admitting: Surgical

## 2024-04-15 NOTE — Progress Notes (Signed)
 Referral entered

## 2024-04-15 NOTE — Addendum Note (Signed)
 Addended by: Acey Ace on: 04/15/2024 01:17 PM   Modules accepted: Orders

## 2024-04-15 NOTE — Progress Notes (Signed)
 Office Visit Note   Patient: Hannah Lynch           Date of Birth: [birth year]           MRN: [id number] Visit Date: 04/14/2024 Requested by: Nonda Bays, FNP [address] [place],  [place] [address] PCP: Nonda Bays, FNP  Subjective: Chief Complaint  Patient presents with   Right Knee - Pain    HPI: Hannah Lynch is a 53 y.o. female who presents to the office for MRI review. Patient denies any changes in symptoms.  Continues to complain mainly of anterior medial right knee pain.  Right knee is actually not the main concern today.  Her left knee has been bothering her more and more.  Has primarily lateral sided left knee pain.  She has history of 2 prior surgeries to the left knee that were both arthroscopic surgeries.  Last surgery was by Dr. Aviva Lemmings and she has had multiple meniscal debridements.  He told her that if she has continued pain, next surgery would be likely a knee replacement.  She has pain that is really limiting her activity and her ambulation.  She also has low back pain and neck pain and all of these joint pains coalesce to keep her from being as active as she would like.  MRI results revealed: MR Knee Right w/o contrast Result Date: 04/06/2024 MR KNEE WITHOUT IV CONTRAST RIGHT COMPARISON: None. CLINICAL HISTORY: Medial meniscal tear. Chronic knee pain. PULSE SEQUENCES: Ax PD FS, Sag T2 ACL, Sag PD FS, Cor PD FS & COR T1 FINDINGS: Bones: There is no fracture or contusion pattern. Mild chondromalacia seen in the central patellofemoral compartment and medial compartment. No full-thickness cartilage defect or significant subchondral reactive edema is identified. There is no significant joint effusion. There is mild inflammation of the suprapatellar fat pad. This can be seen with anterior suprapatellar fat pad impingement. The extensor mechanism is intact. Ligaments: The ACL, PCL, MCL and fibular collateral ligament are intact.  Menisci: There is mild blunting of the apex of the body of the lateral meniscus. No discrete lateral meniscal tear. Medial meniscus is unremarkable. IMPRESSION: Mild degenerative arthrosis with mild chondromalacia. No full-thickness cartilage defect is significant subchondral reactive small reactive joint effusion. There is mild inflammation of the suprapatellar fat pad. This can be seen with anterior suprapatellar fat pad impingement. No ligamentous or meniscal injury is identified. Electronically signed by: Adrien Alberta MD 04/06/2024 05:58 PM EDT RP Workstation: [id number]                 ROS: All systems reviewed are negative as they relate to the chief complaint within the history of present illness.  Patient denies fevers or chills.  Assessment & Plan: Visit Diagnoses:  1. Arthritis of left knee     Plan: Hannah Lynch is a 53 y.o. female who presents to the office for review of right knee MRI.  MRI demonstrates some mild to moderate degenerative changes through the medial compartment in the medial patellofemoral compartment but nothing that looks amenable to arthroscopic intervention.  This is really not bothering her as much as the left knee.  She would like to try physical therapy for her right knee and so we will set this up for her in [place].  Regarding her left knee, she has had multiple surgeries to this knee and was told that she will likely need her knee replacement.  She has had multiple lateral meniscal debridements  and is having primarily lateral sided pain.  Plan at this time, due to her young age and radiographs demonstrating not much arthritis, is to obtain left knee MRI scan to evaluate for any arthroscopically treatable pathology and see if there is anything that we could potentially treat that would postpone the need for knee replacement.  This will also give us  a better idea of how much arthritis she has in that lateral compartment.  Follow-up after MRI to review results with Dr.  Rozelle Corning.  Follow-Up Instructions: Return for Review CT/MRI scan.   Orders:  Orders Placed This Encounter  Procedures   MR Knee Left w/o contrast   No orders of the defined types were placed in this encounter.     Procedures: No procedures performed   Clinical Data: No additional findings.  Objective: Vital Signs: There were no vitals taken for this visit.  Physical Exam:  Constitutional: Patient appears well-developed HEENT:  Head: Normocephalic Eyes:EOM are normal Neck: Normal range of motion Cardiovascular: Normal rate Pulmonary/chest: Effort normal Neurologic: Patient is alert Skin: Skin is warm Psychiatric: Patient has normal mood and affect  Ortho Exam: Ortho exam demonstrates right knee with small effusion.  Left knee with no effusion today.  She has right knee with tenderness over the medial joint line but no lateral joint line tenderness.  Left knee with moderate lateral joint line tenderness but no medial joint line tenderness.  She has excellent range of motion of both knees with 0 degrees extension and 120 degrees of knee flexion.  She has excellent stability to anterior posterior drawer test and varus/valgus stress at 0 and 30 degrees bilaterally.  No pain with hip range of motion.  She is able to perform straight leg raise bilaterally.  No pain with Palpation.  Specialty Comments:  No specialty comments available.  Imaging: No results found.   PMFS History: Patient Active Problem List   Diagnosis Date Noted   Hx of sinus tachycardia 12/22/2023   CKD (chronic kidney disease) stage 2, GFR 60-89 ml/min 12/22/2023   Anxiety 08/24/2023   DDD (degenerative disc disease), cervical 05/20/2023   Chronic neck and back pain 05/20/2023   Arthritis of right acromioclavicular joint 05/10/2023   Bursitis of right shoulder 05/10/2023   Palpitations 11/26/2022   Lumbar radiculopathy 05/02/2021   History of left heart catheterization 10/16/2020   Osteoarthritis of  joint of toe of right foot great toe 08/30/2020   Migraine without status migrainosus, not intractable 11/15/2019   GERD (gastroesophageal reflux disease) 09/27/2019   Essential hypertension 09/27/2019   Fatty liver 09/27/2019   Bilateral sensorineural hearing loss 07/14/2019   Arthropathy of cervical facet joint 08/19/2018   Lumbar facet joint pain 08/19/2018   History of staph infection 07/20/2018   Tonsillar hypertrophy 05/06/2018   Chronic cholecystitis    Hypothyroidism 12/24/2017   Nasal septal deviation 10/22/2017   Nasal turbinate hypertrophy 10/22/2017   Snoring 10/22/2017   Primary insomnia 06/25/2017   Bilateral hearing loss 06/25/2017   Lumbar stenosis with neurogenic claudication 09/25/2014   Past Medical History:  Diagnosis Date   Anxiety    Degenerative disc disease, lumbar    Degenerative disc disease, lumbar 2012   lumbar and cervical region   Depression    Depression, major, single episode, moderate (HCC) 08/24/2023   Endometriosis    Family history of adverse reaction to anesthesia    pt's mother has hx. of post-op N/V   Headache    History of hiatal hernia  Hypertension    Hypothyroidism    IBS (irritable bowel syndrome)    Impingement syndrome of right shoulder 08/20/2022   Nasal septal deviation 11/2017   Nasal turbinate hypertrophy 11/2017   PONV (postoperative nausea and vomiting)    Snoring 11/2017   Trochanteric bursitis, left hip 08/30/2020    Family History  Problem Relation Age of Onset   Hypertension Mother    Diabetes Mother    Heart failure Father    Hypertension Father    Diabetes Father    Heart failure Other    Breast cancer Paternal Grandmother     Past Surgical History:  Procedure Laterality Date   ABDOMINAL HYSTERECTOMY  2005   partial - TAH, no cervix, has both ovaries; done for endometriosis   ANTERIOR CERVICAL DECOMP/DISCECTOMY FUSION  03/25/2007   C4-5   CERVICAL FUSION  2013   CHOLECYSTECTOMY N/A 03/26/2018    Procedure: LAPAROSCOPIC CHOLECYSTECTOMY;  Surgeon: Alanda Allegra, MD;  Location: [facility];  Service: General;  Laterality: N/A;   COLONOSCOPY     HIATAL HERNIA REPAIR     completed with gastric sleeve   KNEE ARTHROSCOPY Left    LAPAROSCOPIC GASTRIC SLEEVE RESECTION N/A 09/27/2019   Procedure: LAPAROSCOPIC GASTRIC SLEEVE RESECTION, HIATAL HERNIA REPARI ;Upper Endo, Eras Pathway;  Surgeon: Aldean Hummingbird, MD;  Location: [facility];  Service: General;  Laterality: N/A;   LUMBAR LAMINECTOMY/DECOMPRESSION MICRODISCECTOMY  11/21/2004   L4-5   MAXIMUM ACCESS (MAS)POSTERIOR LUMBAR INTERBODY FUSION (PLIF) 1 LEVEL N/A 09/25/2014   Procedure: Lumbar four-five MAXIMUM ACCESS (MAS) POSTERIOR LUMBAR INTERBODY FUSION (PLIF) 1 LEVEL;  Surgeon: Manya Sells, MD;  Location: [facility];  Service: Neurosurgery;  Laterality: N/A;   NASAL SEPTOPLASTY W/ TURBINOPLASTY N/A 12/07/2017   Procedure: NASAL SEPTOPLASTY WITH BILATERAL TURBINATE REDUCTION;  Surgeon: Virgina Grills, MD;  Location: [facility];  Service: ENT;  Laterality: N/A;   ORIF ANKLE FRACTURE Right    TONSILLECTOMY  06/2018   UPPER GI ENDOSCOPY     Social History   Occupational History   Not on file  Tobacco Use   Smoking status: Never   Smokeless tobacco: Never  Vaping Use   Vaping status: Never Used  Substance and Sexual Activity   Alcohol use: Yes    Comment: once in awhile   Drug use: Never   Sexual activity: Yes    Birth control/protection: Surgical    Comment: hyst

## 2024-04-19 NOTE — Therapy (Signed)
 OUTPATIENT PHYSICAL THERAPY EVALUATION   Patient Name: Hannah Lynch MRN: [id number] DOB:07/17/1971, 53 y.o., female Today's Date: 04/20/2024  END OF SESSION:  PT End of Session - 04/20/24 1039     Visit Number 1    Number of Visits 8    Date for PT Re-Evaluation 06/01/24    Authorization Type [employer] MCD    Authorization - Visit Number 1    Authorization - Number of Visits 27    Progress Note Due on Visit 8    PT Start Time 1000    PT Stop Time 1040    PT Time Calculation (min) 40 min    Behavior During Therapy WFL for tasks assessed/performed             Past Medical History:  Diagnosis Date   Anxiety    Degenerative disc disease, lumbar    Degenerative disc disease, lumbar 2012   lumbar and cervical region   Depression    Depression, major, single episode, moderate (HCC) 08/24/2023   Endometriosis    Family history of adverse reaction to anesthesia    pt's mother has hx. of post-op N/V   Headache    History of hiatal hernia    Hypertension    Hypothyroidism    IBS (irritable bowel syndrome)    Impingement syndrome of right shoulder 08/20/2022   Nasal septal deviation 11/2017   Nasal turbinate hypertrophy 11/2017   PONV (postoperative nausea and vomiting)    Snoring 11/2017   Trochanteric bursitis, left hip 08/30/2020   Past Surgical History:  Procedure Laterality Date   ABDOMINAL HYSTERECTOMY  2005   partial - TAH, no cervix, has both ovaries; done for endometriosis   ANTERIOR CERVICAL DECOMP/DISCECTOMY FUSION  03/25/2007   C4-5   CERVICAL FUSION  2013   CHOLECYSTECTOMY N/A 03/26/2018   Procedure: LAPAROSCOPIC CHOLECYSTECTOMY;  Surgeon: Alanda Allegra, MD;  Location: [facility];  Service: General;  Laterality: N/A;   COLONOSCOPY     HIATAL HERNIA REPAIR     completed with gastric sleeve   KNEE ARTHROSCOPY Left    LAPAROSCOPIC GASTRIC SLEEVE RESECTION N/A 09/27/2019   Procedure: LAPAROSCOPIC GASTRIC SLEEVE RESECTION, HIATAL HERNIA REPARI ;Upper Endo, Eras  Pathway;  Surgeon: Aldean Hummingbird, MD;  Location: [facility];  Service: General;  Laterality: N/A;   LUMBAR LAMINECTOMY/DECOMPRESSION MICRODISCECTOMY  11/21/2004   L4-5   MAXIMUM ACCESS (MAS)POSTERIOR LUMBAR INTERBODY FUSION (PLIF) 1 LEVEL N/A 09/25/2014   Procedure: Lumbar four-five MAXIMUM ACCESS (MAS) POSTERIOR LUMBAR INTERBODY FUSION (PLIF) 1 LEVEL;  Surgeon: Manya Sells, MD;  Location: [facility];  Service: Neurosurgery;  Laterality: N/A;   NASAL SEPTOPLASTY W/ TURBINOPLASTY N/A 12/07/2017   Procedure: NASAL SEPTOPLASTY WITH BILATERAL TURBINATE REDUCTION;  Surgeon: Virgina Grills, MD;  Location: [facility];  Service: ENT;  Laterality: N/A;   ORIF ANKLE FRACTURE Right    TONSILLECTOMY  06/2018   UPPER GI ENDOSCOPY     Patient Active Problem List   Diagnosis Date Noted   Hx of sinus tachycardia 12/22/2023   CKD (chronic kidney disease) stage 2, GFR 60-89 ml/min 12/22/2023   Anxiety 08/24/2023   DDD (degenerative disc disease), cervical 05/20/2023   Chronic neck and back pain 05/20/2023   Arthritis of right acromioclavicular joint 05/10/2023   Bursitis of right shoulder 05/10/2023   Palpitations 11/26/2022   Lumbar radiculopathy 05/02/2021   History of left heart catheterization 10/16/2020   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 08/30/2020   Migraine  without status migrainosus, not intractable 11/15/2019   GERD (gastroesophageal reflux disease) 09/27/2019   Essential hypertension 09/27/2019   Fatty liver 09/27/2019   Bilateral sensorineural hearing loss 07/14/2019   Arthropathy of cervical facet joint 08/19/2018   Lumbar facet joint pain 08/19/2018   History of staph infection 07/20/2018   Tonsillar hypertrophy 05/06/2018   Chronic cholecystitis    Hypothyroidism 12/24/2017   Nasal septal deviation 10/22/2017   Nasal turbinate hypertrophy 10/22/2017   Snoring 10/22/2017   Primary insomnia 06/25/2017   Bilateral hearing loss 06/25/2017   Lumbar  stenosis with neurogenic claudication 09/25/2014    PCP: Nonda Bays, FNP   REFERRING PROVIDER: Casilda Clayman, PA-C    REFERRING DIAG: [phone] (ICD-10-CM) - Chronic pain of right knee   Rationale for Evaluation and Treatment:  Rehabiliation  THERAPY DIAG:  Chronic pain of right knee  Chronic pain of left knee  Muscle weakness (generalized)  Difficulty in walking, not elsewhere classified  ONSET DATE: 4-5 months of knee pain   SUBJECTIVE:                                                                                                                                                                                           SUBJECTIVE STATEMENT: She is having bilat knee pain.MD referred her for Rt knee pain.  She has recent MRI of Rt knee, see below for details. Regarding her left knee, she has had multiple surgeries to this knee and was told that she will likely need her knee replacement. She has had multiple lateral meniscal debridements and is having primarily lateral sided pain.  MRI of left knee has been recommended and she will have this at the end of the month. Her knees pop at times.  PERTINENT HISTORY:  See above PMH and surgical history  PAIN:  NPRS scale: 6-7/10 upon arrival Pain location:bilat knees, top and also medial Pain description: constant, sharp pains.  Aggravating factors: stairs, turning Relieving factors: rest, meds   PRECAUTIONS: ,  None  RED FLAGS: None   WEIGHT BEARING RESTRICTIONS:  No  FALLS:  Has patient fallen in last 6 months? No   OCCUPATION:  none  PLOF:  Independent  PATIENT GOALS:  Reduce the pain  OBJECTIVE:  Note: Objective measures were completed at Evaluation unless otherwise noted.  DIAGNOSTIC FINDINGS:  Left knee MRI scheduled for 05/09/24 Right knee MRI 04/06/24 IMPRESSION: Mild degenerative arthrosis with mild chondromalacia. No full-thickness cartilage defect is significant subchondral  reactive small reactive joint effusion.   There is mild inflammation of the suprapatellar fat pad. This can be seen with anterior suprapatellar fat pad  impingement.   No ligamentous or meniscal injury is identified.  PATIENT SURVEYS:  Patient-Specific Activity Scoring Scheme  0 represents "unable to perform." 10 represents "able to perform at prior level. 0 1 2 3 4 5 6 7 8 9  10 (Date and Score)   Activity Eval     1. Going up steps 5    2. Walking for long periods 5    3. Sitting for too long 5   4.    5.    Score 5/10    Total score = sum of the activity scores/number of activities Minimum detectable change (90%CI) for average score = 2 points Minimum detectable change (90%CI) for single activity score = 3 points   GAIT: Distance walked: limited community distances due to pain Assistive device utilized: None Level of assistance: Complete Independence Comments: decreased hip/knee flexion during swing phase  Knee  LOWER EXTREMITY strength:     MMT Right eval Left eval  Hip flexion 4+ 4+  Hip extension    Hip abduction 3 3  Hip adduction    Hip internal rotation    Hip external rotation    Knee flexion 4 4  Knee extension 4 4  Ankle dorsiflexion    Ankle plantarflexion    Ankle inversion    Ankle eversion     (Blank rows = not tested)   LOWER EXTREMITY ROM:    AROM Right eval Left eval  Hip flexion    Hip extension    Hip abduction    Hip adduction    Hip internal rotation    Hip external rotation    Knee flexion 125 125  Knee extension 0 0  Ankle dorsiflexion    Ankle plantarflexion    Ankle inversion    Ankle eversion     (Blank rows = not tested)    Flexability: increased tightness noted in hamstrings and quads bilat  FUNCTIONAL TESTS:  5 times sit to stand: 15 sec                                                                                                                              TREATMENT DATE:  Eval HEP creation and  review with demonstration and trial set preformed, see below for details  PATIENT EDUCATION: Education details: HEP, PT plan of care Person educated: Patient Education method: Explanation, Demonstration, Verbal cues, and Handouts Education comprehension: verbalized understanding, further education recommended   HOME EXERCISE PROGRAM: Access Code: [email or web address] URL: [email or web address] Date: 04/20/2024 Prepared by: Jamee Mazzoni  Exercises - Seated Straight Leg Raise with Quad Contraction  - 2 x daily - 6 x weekly - 3 sets - 10 reps - Sit to Stand Without Arm Support  - 2 x daily - 6 x weekly - 1-2 sets - 5 reps - Seated Knee Extension with Resistance  - 2 x daily - 6 x weekly - 2 sets - 10 reps - Standing Hip Abduction with  Resistance at Ankles and Counter Support  - 2 x daily - 6 x weekly - 2 sets - 10 reps - Standing Quad Stretch with Strap  - 2 x daily - 6 x weekly - 1 sets - 3 reps - 30 sec hold - Seated Hamstring Stretch  - 2 x daily - 6 x weekly - 1 sets - 3 reps - 30 sec hold  ASSESSMENT:  CLINICAL IMPRESSION: Patient referred to PT for chronic Rt knee pain. MRI IMPRESSION:Mild degenerative arthrosis with mild chondromalacia. No full-thickness cartilage defect is significant subchondral reactive small reactive joint effusion. There is mild inflammation of the suprapatellar fat pad. This can be seen with anterior suprapatellar fat pad impingement. No ligamentous or meniscal injury is identified. She does lack strength in her knees/hips and has some tightness noted in hamstrings and quads. She also has chronic pain in left knee and will have upcoming MRI for this. Patient will benefit from skilled PT to improve overall function and to address impairments and limitations listed below.  OBJECTIVE IMPAIRMENTS: decreased activity tolerance for ADL's, difficulty walking, decreased endurance, decreased mobility, decreased ROM, decreased strength, impaired flexibility, impaired  LE use, and pain.  ACTIVITY LIMITATIONS: bending, lifting, stairs, locomotion,   PERSONAL FACTORS: see above PMH  also affecting patient's functional outcome.  REHAB POTENTIAL: Fair , chronic nature of pain with previous surgical history  CLINICAL DECISION MAKING: Stable/uncomplicated  EVALUATION COMPLEXITY: Low    GOALS: Short term PT Goals Target date: 05/18/2024   Pt will be I and compliant with HEP. Baseline:  Goal status: New Pt will decrease pain by 25% overall with walking Baseline:6/10 Goal status: New  Long term PT goals Target date:06/01/2024  Pt will improve strength to at least 4+/5 MMT to improve functional strength Baseline:4/5 Goal status: New Pt will improve Patient specific functional scale (PSFS) to at least 7/10 to show improved function level Baseline:5/10 Goal status: New Pt will reduce pain to overall less than 3/10 with usual activity and walking Baseline:6/10 Goal status: New Pt will improve 5 times sit to stand test to less than 13 seconds Baseline:15 seconds Goal status: New  PLAN: PT FREQUENCY: 1-2 times per week   PT DURATION: 6 weeks  PLANNED INTERVENTIONS (unless contraindicated): 97110-Therapeutic exercises, 97530- Therapeutic activity, [id number]- Neuromuscular re-education, 97535- Self Care, 09811- Manual therapy, [id number]- Gait training, B1478- Electrical stimulation (unattended), 97035- Ultrasound, and 97033- Ionotophoresis 4mg /ml Dexamethasone   PLAN FOR NEXT SESSION: review HEP, knee strength focus, show her gym equipment she can safely use.  NEXT MD VISIT: Left knee MRI scheduled for 05/09/24  Mick Alamin, PT,DPT 04/20/2024, 10:43 AM

## 2024-04-20 ENCOUNTER — Ambulatory Visit: Attending: Surgical | Admitting: Physical Therapy

## 2024-04-20 ENCOUNTER — Encounter: Payer: Self-pay | Admitting: Physical Therapy

## 2024-04-20 DIAGNOSIS — M25562 Pain in left knee: Secondary | ICD-10-CM | POA: Insufficient documentation

## 2024-04-20 DIAGNOSIS — M25561 Pain in right knee: Secondary | ICD-10-CM | POA: Insufficient documentation

## 2024-04-20 DIAGNOSIS — G8929 Other chronic pain: Secondary | ICD-10-CM | POA: Diagnosis present

## 2024-04-20 DIAGNOSIS — M6281 Muscle weakness (generalized): Secondary | ICD-10-CM | POA: Insufficient documentation

## 2024-04-20 DIAGNOSIS — R262 Difficulty in walking, not elsewhere classified: Secondary | ICD-10-CM | POA: Insufficient documentation

## 2024-04-27 ENCOUNTER — Ambulatory Visit: Admitting: Physical Therapy

## 2024-04-27 ENCOUNTER — Encounter (HOSPITAL_COMMUNITY): Payer: Self-pay | Admitting: *Deleted

## 2024-05-04 ENCOUNTER — Ambulatory Visit: Admitting: Physical Therapy

## 2024-05-05 ENCOUNTER — Ambulatory Visit: Payer: Self-pay | Admitting: Physical Therapy

## 2024-05-06 ENCOUNTER — Encounter: Payer: Self-pay | Admitting: Physical Therapy

## 2024-05-06 ENCOUNTER — Ambulatory Visit: Admitting: Physical Therapy

## 2024-05-06 DIAGNOSIS — G8929 Other chronic pain: Secondary | ICD-10-CM

## 2024-05-06 DIAGNOSIS — M25561 Pain in right knee: Secondary | ICD-10-CM | POA: Diagnosis not present

## 2024-05-06 DIAGNOSIS — R262 Difficulty in walking, not elsewhere classified: Secondary | ICD-10-CM

## 2024-05-06 DIAGNOSIS — M6281 Muscle weakness (generalized): Secondary | ICD-10-CM

## 2024-05-06 NOTE — Therapy (Signed)
 OUTPATIENT PHYSICAL THERAPY TREATMENT   Patient Name: Hannah Lynch MRN: [id number] DOB:Dec 08, 1970, 53 y.o., female Today's Date: 05/06/2024  END OF SESSION:  PT End of Session - 05/06/24 0925     Visit Number 2    Number of Visits 8    Date for PT Re-Evaluation 06/01/24    Authorization Type [employer] MCD    Authorization - Visit Number 2    Authorization - Number of Visits 27    Progress Note Due on Visit 8    PT Start Time 0920    PT Stop Time 1008    PT Time Calculation (min) 48 min    Behavior During Therapy [facility] for tasks assessed/performed           Past Medical History:  Diagnosis Date   Anxiety    Degenerative disc disease, lumbar    Degenerative disc disease, lumbar 2012   lumbar and cervical region   Depression    Depression, major, single episode, moderate (HCC) 08/24/2023   Endometriosis    Family history of adverse reaction to anesthesia    pt's mother has hx. of post-op N/V   Headache    History of hiatal hernia    Hypertension    Hypothyroidism    IBS (irritable bowel syndrome)    Impingement syndrome of right shoulder 08/20/2022   Nasal septal deviation 11/2017   Nasal turbinate hypertrophy 11/2017   PONV (postoperative nausea and vomiting)    Snoring 11/2017   Trochanteric bursitis, left hip 08/30/2020   Past Surgical History:  Procedure Laterality Date   ABDOMINAL HYSTERECTOMY  2005   partial - TAH, no cervix, has both ovaries; done for endometriosis   ANTERIOR CERVICAL DECOMP/DISCECTOMY FUSION  03/25/2007   C4-5   CERVICAL FUSION  2013   CHOLECYSTECTOMY N/A 03/26/2018   Procedure: LAPAROSCOPIC CHOLECYSTECTOMY;  Surgeon: Mavis Anes, MD;  Location: [facility];  Service: General;  Laterality: N/A;   COLONOSCOPY     HIATAL HERNIA REPAIR     completed with gastric sleeve   KNEE ARTHROSCOPY Left    LAPAROSCOPIC GASTRIC SLEEVE RESECTION N/A 09/27/2019   Procedure: LAPAROSCOPIC GASTRIC SLEEVE RESECTION, HIATAL HERNIA REPARI ;Upper Endo, Eras  Pathway;  Surgeon: Tanda Locus, MD;  Location: [facility];  Service: General;  Laterality: N/A;   LUMBAR LAMINECTOMY/DECOMPRESSION MICRODISCECTOMY  11/21/2004   L4-5   MAXIMUM ACCESS (MAS)POSTERIOR LUMBAR INTERBODY FUSION (PLIF) 1 LEVEL N/A 09/25/2014   Procedure: Lumbar four-five MAXIMUM ACCESS (MAS) POSTERIOR LUMBAR INTERBODY FUSION (PLIF) 1 LEVEL;  Surgeon: Fairy Levels, MD;  Location: [facility];  Service: Neurosurgery;  Laterality: N/A;   NASAL SEPTOPLASTY W/ TURBINOPLASTY N/A 12/07/2017   Procedure: NASAL SEPTOPLASTY WITH BILATERAL TURBINATE REDUCTION;  Surgeon: Carlie Clark, MD;  Location: [facility];  Service: ENT;  Laterality: N/A;   ORIF ANKLE FRACTURE Right    TONSILLECTOMY  06/2018   UPPER GI ENDOSCOPY     Patient Active Problem List   Diagnosis Date Noted   Hx of sinus tachycardia 12/22/2023   CKD (chronic kidney disease) stage 2, GFR 60-89 ml/min 12/22/2023   Anxiety 08/24/2023   DDD (degenerative disc disease), cervical 05/20/2023   Chronic neck and back pain 05/20/2023   Arthritis of right acromioclavicular joint 05/10/2023   Bursitis of right shoulder 05/10/2023   Palpitations 11/26/2022   Lumbar radiculopathy 05/02/2021   History of left heart catheterization 10/16/2020   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 08/30/2020   Migraine without status  migrainosus, not intractable 11/15/2019   GERD (gastroesophageal reflux disease) 09/27/2019   Essential hypertension 09/27/2019   Fatty liver 09/27/2019   Bilateral sensorineural hearing loss 07/14/2019   Arthropathy of cervical facet joint 08/19/2018   Lumbar facet joint pain 08/19/2018   History of staph infection 07/20/2018   Tonsillar hypertrophy 05/06/2018   Chronic cholecystitis    Hypothyroidism 12/24/2017   Nasal septal deviation 10/22/2017   Nasal turbinate hypertrophy 10/22/2017   Snoring 10/22/2017   Primary insomnia 06/25/2017   Bilateral hearing loss 06/25/2017   Lumbar  stenosis with neurogenic claudication 09/25/2014    PCP: Knute Thersia Bitters, FNP   REFERRING PROVIDER: Shirly Carlin CROME, PA-C    REFERRING DIAG: [phone] (ICD-10-CM) - Chronic pain of right knee   Rationale for Evaluation and Treatment:  Rehabiliation  THERAPY DIAG:  Chronic pain of right knee  Chronic pain of left knee  Muscle weakness (generalized)  Difficulty in walking, not elsewhere classified  ONSET DATE: 4-5 months of knee pain   SUBJECTIVE:                                                                                                                                                                                           SUBJECTIVE STATEMENT: She relays she now has a part time job at [employer]. This has allowed her to be more active but has also made it harder for her to attend PT. Pain is down to 3/10 in Right knee.   PERTINENT HISTORY:  See above PMH and surgical history  PAIN:  NPRS scale: 3/10 upon arrival Pain location:bilat knees, top and also medial Pain description: constant, sharp pains.  Aggravating factors: stairs, turning Relieving factors: rest, meds   PRECAUTIONS: ,  None  RED FLAGS: None   WEIGHT BEARING RESTRICTIONS:  No  FALLS:  Has patient fallen in last 6 months? No   OCCUPATION:  none  PLOF:  Independent  PATIENT GOALS:  Reduce the pain  OBJECTIVE:  Note: Objective measures were completed at Evaluation unless otherwise noted.  DIAGNOSTIC FINDINGS:  Left knee MRI scheduled for 05/09/24 Right knee MRI 04/06/24 IMPRESSION: Mild degenerative arthrosis with mild chondromalacia. No full-thickness cartilage defect is significant subchondral reactive small reactive joint effusion.   There is mild inflammation of the suprapatellar fat pad. This can be seen with anterior suprapatellar fat pad impingement.   No ligamentous or meniscal injury is identified.  PATIENT SURVEYS:  Patient-Specific Activity Scoring  Scheme  0 represents "unable to perform." 10 represents "able to perform at prior level. 0 1 2 3 4 5 6 7 8 9  10 (Date and Score)  Activity Eval     1. Going up steps 5    2. Walking for long periods 5    3. Sitting for too long 5   4.    5.    Score 5/10    Total score = sum of the activity scores/number of activities Minimum detectable change (90%CI) for average score = 2 points Minimum detectable change (90%CI) for single activity score = 3 points   GAIT: Distance walked: limited community distances due to pain Assistive device utilized: None Level of assistance: Complete Independence Comments: decreased hip/knee flexion during swing phase  Knee  LOWER EXTREMITY strength:     MMT Right eval Left eval  Hip flexion 4+ 4+  Hip extension    Hip abduction 3 3  Hip adduction    Hip internal rotation    Hip external rotation    Knee flexion 4 4  Knee extension 4 4  Ankle dorsiflexion    Ankle plantarflexion    Ankle inversion    Ankle eversion     (Blank rows = not tested)   LOWER EXTREMITY ROM:    AROM Right eval Left eval  Hip flexion    Hip extension    Hip abduction    Hip adduction    Hip internal rotation    Hip external rotation    Knee flexion 125 125  Knee extension 0 0  Ankle dorsiflexion    Ankle plantarflexion    Ankle inversion    Ankle eversion     (Blank rows = not tested)    Flexability: increased tightness noted in hamstrings and quads bilat  FUNCTIONAL TESTS:  5 times sit to stand: 15 sec                                                                                                                              TREATMENT DATE:  05/06/24 Therex Nu step L5 X 10 min UE/LE True ride bike L1 X 10 min Seated hamstring stretch 30 sec X 3 bilat Standing quad stretch 30 sec X 2 bilat  [employer] Leg press machine DL 74# [id number] Leg extension machine DL 74# [id number] Hamstring curl machine DL 59# [id number] Calf raise machine DL 59#  [id number]   PATIENT EDUCATION: Education details: HEP, PT plan of care Person educated: Patient Education method: Explanation, Demonstration, Verbal cues, and Handouts Education comprehension: verbalized understanding, further education recommended   HOME EXERCISE PROGRAM: Access Code: [email or web address] URL: [email or web address] Date: 04/20/2024 Prepared by: Redell Moose  Exercises - Seated Straight Leg Raise with Quad Contraction  - 2 x daily - 6 x weekly - 3 sets - 10 reps - Sit to Stand Without Arm Support  - 2 x daily - 6 x weekly - 1-2 sets - 5 reps - Seated Knee Extension with Resistance  - 2 x daily - 6 x weekly - 2 sets - 10 reps - Standing Hip Abduction with Resistance at Ankles and  Counter Support  - 2 x daily - 6 x weekly - 2 sets - 10 reps - Standing Quad Stretch with Strap  - 2 x daily - 6 x weekly - 1 sets - 3 reps - 30 sec hold - Seated Hamstring Stretch  - 2 x daily - 6 x weekly - 1 sets - 3 reps - 30 sec hold  ASSESSMENT:  CLINICAL IMPRESSION: Session today focused on showing her gym equipment she can use to improve knee ROM and strength. She showed good understanding of these and return demonstration. We will monitor for any soreness to this. Her Right knee was doing better today than it was by Eval. Her left knee still bothers her and she will have upcoming MRI for it.     Per Eval:  Patient referred to PT for chronic Rt knee pain. MRI IMPRESSION:Mild degenerative arthrosis with mild chondromalacia. No full-thickness cartilage defect is significant subchondral reactive small reactive joint effusion. There is mild inflammation of the suprapatellar fat pad. This can be seen with anterior suprapatellar fat pad impingement. No ligamentous or meniscal injury is identified. She does lack strength in her knees/hips and has some tightness noted in hamstrings and quads. She also has chronic pain in left knee and will have upcoming MRI for this. Patient will benefit from  skilled PT to improve overall function and to address impairments and limitations listed below.  OBJECTIVE IMPAIRMENTS: decreased activity tolerance for ADL's, difficulty walking, decreased endurance, decreased mobility, decreased ROM, decreased strength, impaired flexibility, impaired LE use, and pain.  ACTIVITY LIMITATIONS: bending, lifting, stairs, locomotion,   PERSONAL FACTORS: see above PMH  also affecting patient's functional outcome.  REHAB POTENTIAL: Fair , chronic nature of pain with previous surgical history  CLINICAL DECISION MAKING: Stable/uncomplicated  EVALUATION COMPLEXITY: Low    GOALS: Short term PT Goals Target date: 05/18/2024   Pt will be I and compliant with HEP. Baseline:  Goal status: New Pt will decrease pain by 25% overall with walking Baseline:6/10 Goal status: New  Long term PT goals Target date:06/01/2024  Pt will improve strength to at least 4+/5 MMT to improve functional strength Baseline:4/5 Goal status: New Pt will improve Patient specific functional scale (PSFS) to at least 7/10 to show improved function level Baseline:5/10 Goal status: New Pt will reduce pain to overall less than 3/10 with usual activity and walking Baseline:6/10 Goal status: New Pt will improve 5 times sit to stand test to less than 13 seconds Baseline:15 seconds Goal status: New  PLAN: PT FREQUENCY: 1-2 times per week   PT DURATION: 6 weeks  PLANNED INTERVENTIONS (unless contraindicated): 97110-Therapeutic exercises, 97530- Therapeutic activity, [id number]- Neuromuscular re-education, 97535- Self Care, 02859- Manual therapy, [id number]- Gait training, H9716- Electrical stimulation (unattended), 97035- Ultrasound, and 97033- Ionotophoresis 4mg /ml Dexamethasone   PLAN FOR NEXT SESSION: knee/hip  strength focus and stretching as tolerated. NEXT MD VISIT: Left knee MRI scheduled for 05/09/24  Redell JONELLE Moose, PT,DPT 05/06/2024, 10:10 AM

## 2024-05-09 ENCOUNTER — Ambulatory Visit
Admission: RE | Admit: 2024-05-09 | Discharge: 2024-05-09 | Disposition: A | Discharge status: Home / Self Care | Source: Ambulatory Visit | Attending: Surgical

## 2024-05-09 ENCOUNTER — Other Ambulatory Visit (HOSPITAL_COMMUNITY): Payer: Self-pay

## 2024-05-09 DIAGNOSIS — M1712 Unilateral primary osteoarthritis, left knee: Secondary | ICD-10-CM

## 2024-05-09 MED ORDER — HYDROCODONE-ACETAMINOPHEN 5-325 MG PO TABS
1.0000 | ORAL_TABLET | Freq: Three times a day (TID) | ORAL | 0 refills | Status: DC | PRN
  Filled 2024-05-09: qty 90, 30d supply, fill #0

## 2024-05-10 ENCOUNTER — Other Ambulatory Visit: Payer: Self-pay

## 2024-05-10 ENCOUNTER — Other Ambulatory Visit (HOSPITAL_COMMUNITY): Payer: Self-pay

## 2024-05-10 MED ORDER — MUPIROCIN 2 % EX OINT
TOPICAL_OINTMENT | CUTANEOUS | 0 refills | Status: DC
  Filled 2024-05-10: qty 22, 30d supply, fill #0

## 2024-05-11 ENCOUNTER — Ambulatory Visit: Attending: Surgical | Admitting: Physical Therapy

## 2024-05-11 ENCOUNTER — Encounter: Admitting: Physical Therapy

## 2024-05-11 ENCOUNTER — Encounter: Payer: Self-pay | Admitting: Physical Therapy

## 2024-05-11 DIAGNOSIS — G8929 Other chronic pain: Secondary | ICD-10-CM | POA: Insufficient documentation

## 2024-05-11 DIAGNOSIS — M25561 Pain in right knee: Secondary | ICD-10-CM | POA: Diagnosis present

## 2024-05-11 DIAGNOSIS — R262 Difficulty in walking, not elsewhere classified: Secondary | ICD-10-CM | POA: Insufficient documentation

## 2024-05-11 DIAGNOSIS — M25562 Pain in left knee: Secondary | ICD-10-CM | POA: Insufficient documentation

## 2024-05-11 DIAGNOSIS — M6281 Muscle weakness (generalized): Secondary | ICD-10-CM | POA: Diagnosis present

## 2024-05-11 NOTE — Therapy (Signed)
 OUTPATIENT PHYSICAL THERAPY TREATMENT   Patient Name: Hannah Lynch MRN: [id number] DOB:12/10/1970, 53 y.o., female Today's Date: 05/11/2024  END OF SESSION:  PT End of Session - 05/11/24 1329     Visit Number 3    Number of Visits 8    Date for PT Re-Evaluation 06/01/24    Authorization Type [employer] MCD    Authorization - Visit Number 3    Authorization - Number of Visits 27    Progress Note Due on Visit 8    PT Start Time 1252    PT Stop Time 1335    PT Time Calculation (min) 43 min    Behavior During Therapy WFL for tasks assessed/performed            Past Medical History:  Diagnosis Date   Anxiety    Degenerative disc disease, lumbar    Degenerative disc disease, lumbar 2012   lumbar and cervical region   Depression    Depression, major, single episode, moderate (HCC) 08/24/2023   Endometriosis    Family history of adverse reaction to anesthesia    pt's mother has hx. of post-op N/V   Headache    History of hiatal hernia    Hypertension    Hypothyroidism    IBS (irritable bowel syndrome)    Impingement syndrome of right shoulder 08/20/2022   Nasal septal deviation 11/2017   Nasal turbinate hypertrophy 11/2017   PONV (postoperative nausea and vomiting)    Snoring 11/2017   Trochanteric bursitis, left hip 08/30/2020   Past Surgical History:  Procedure Laterality Date   ABDOMINAL HYSTERECTOMY  2005   partial - TAH, no cervix, has both ovaries; done for endometriosis   ANTERIOR CERVICAL DECOMP/DISCECTOMY FUSION  03/25/2007   C4-5   CERVICAL FUSION  2013   CHOLECYSTECTOMY N/A 03/26/2018   Procedure: LAPAROSCOPIC CHOLECYSTECTOMY;  Surgeon: Mavis Anes, MD;  Location: [facility];  Service: General;  Laterality: N/A;   COLONOSCOPY     HIATAL HERNIA REPAIR     completed with gastric sleeve   KNEE ARTHROSCOPY Left    LAPAROSCOPIC GASTRIC SLEEVE RESECTION N/A 09/27/2019   Procedure: LAPAROSCOPIC GASTRIC SLEEVE RESECTION, HIATAL HERNIA REPARI ;Upper Endo, Eras  Pathway;  Surgeon: Tanda Locus, MD;  Location: [facility];  Service: General;  Laterality: N/A;   LUMBAR LAMINECTOMY/DECOMPRESSION MICRODISCECTOMY  11/21/2004   L4-5   MAXIMUM ACCESS (MAS)POSTERIOR LUMBAR INTERBODY FUSION (PLIF) 1 LEVEL N/A 09/25/2014   Procedure: Lumbar four-five MAXIMUM ACCESS (MAS) POSTERIOR LUMBAR INTERBODY FUSION (PLIF) 1 LEVEL;  Surgeon: Fairy Levels, MD;  Location: [facility];  Service: Neurosurgery;  Laterality: N/A;   NASAL SEPTOPLASTY W/ TURBINOPLASTY N/A 12/07/2017   Procedure: NASAL SEPTOPLASTY WITH BILATERAL TURBINATE REDUCTION;  Surgeon: Carlie Clark, MD;  Location: [facility];  Service: ENT;  Laterality: N/A;   ORIF ANKLE FRACTURE Right    TONSILLECTOMY  06/2018   UPPER GI ENDOSCOPY     Patient Active Problem List   Diagnosis Date Noted   Hx of sinus tachycardia 12/22/2023   CKD (chronic kidney disease) stage 2, GFR 60-89 ml/min 12/22/2023   Anxiety 08/24/2023   DDD (degenerative disc disease), cervical 05/20/2023   Chronic neck and back pain 05/20/2023   Arthritis of right acromioclavicular joint 05/10/2023   Bursitis of right shoulder 05/10/2023   Palpitations 11/26/2022   Lumbar radiculopathy 05/02/2021   History of left heart catheterization 10/16/2020   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 08/30/2020   Migraine without  status migrainosus, not intractable 11/15/2019   GERD (gastroesophageal reflux disease) 09/27/2019   Essential hypertension 09/27/2019   Fatty liver 09/27/2019   Bilateral sensorineural hearing loss 07/14/2019   Arthropathy of cervical facet joint 08/19/2018   Lumbar facet joint pain 08/19/2018   History of staph infection 07/20/2018   Tonsillar hypertrophy 05/06/2018   Chronic cholecystitis    Hypothyroidism 12/24/2017   Nasal septal deviation 10/22/2017   Nasal turbinate hypertrophy 10/22/2017   Snoring 10/22/2017   Primary insomnia 06/25/2017   Bilateral hearing loss 06/25/2017   Lumbar  stenosis with neurogenic claudication 09/25/2014    PCP: Knute Thersia Bitters, FNP   REFERRING PROVIDER: Shirly Carlin CROME, PA-C    REFERRING DIAG: [phone] (ICD-10-CM) - Chronic pain of right knee   Rationale for Evaluation and Treatment:  Rehabiliation  THERAPY DIAG:  Chronic pain of right knee  Chronic pain of left knee  Muscle weakness (generalized)  Difficulty in walking, not elsewhere classified  ONSET DATE: 4-5 months of knee pain   SUBJECTIVE:                                                                                                                                                                                           SUBJECTIVE STATEMENT: She relays some soreness and cramping manly in her calf after last time so wants to back down the intensity some today with PT. She did have MRI for Lt knee recently but has not talked with MD about results. This is not scheduled until 06/01/24  PERTINENT HISTORY:  See above PMH and surgical history  PAIN:  NPRS scale: 3/10 upon arrival Pain location:bilat knees, top and also medial Pain description: constant, sharp pains.  Aggravating factors: stairs, turning Relieving factors: rest, meds   PRECAUTIONS: ,  None  RED FLAGS: None   WEIGHT BEARING RESTRICTIONS:  No  FALLS:  Has patient fallen in last 6 months? No   OCCUPATION:  none  PLOF:  Independent  PATIENT GOALS:  Reduce the pain  OBJECTIVE:  Note: Objective measures were completed at Evaluation unless otherwise noted.  DIAGNOSTIC FINDINGS:  05/09/24 Left knee MRI IMPRESSION: Prior meniscectomy and/or chronic degenerative injury to the lateral meniscus.   Mild-to-moderate tricompartmental chondromalacia greatest the lateral compartment. No significant degenerative edema.   Small joint effusion and small popliteal cyst.   Mild semimembranosus bursal fluid.   Right knee MRI 04/06/24 IMPRESSION: Mild degenerative arthrosis with mild  chondromalacia. No full-thickness cartilage defect is significant subchondral reactive small reactive joint effusion.   There is mild inflammation of the suprapatellar fat pad. This can be seen with anterior suprapatellar fat pad impingement.  No ligamentous or meniscal injury is identified.  PATIENT SURVEYS:  Patient-Specific Activity Scoring Scheme  0 represents "unable to perform." 10 represents "able to perform at prior level. 0 1 2 3 4 5 6 7 8 9  10 (Date and Score)   Activity Eval     1. Going up steps 5    2. Walking for long periods 5    3. Sitting for too long 5   4.    5.    Score 5/10    Total score = sum of the activity scores/number of activities Minimum detectable change (90%CI) for average score = 2 points Minimum detectable change (90%CI) for single activity score = 3 points   GAIT: Distance walked: limited community distances due to pain Assistive device utilized: None Level of assistance: Complete Independence Comments: decreased hip/knee flexion during swing phase  Knee  LOWER EXTREMITY strength:     MMT Right eval Left eval  Hip flexion 4+ 4+  Hip extension    Hip abduction 3 3  Hip adduction    Hip internal rotation    Hip external rotation    Knee flexion 4 4  Knee extension 4 4  Ankle dorsiflexion    Ankle plantarflexion    Ankle inversion    Ankle eversion     (Blank rows = not tested)   LOWER EXTREMITY ROM:    AROM Right eval Left eval  Hip flexion    Hip extension    Hip abduction    Hip adduction    Hip internal rotation    Hip external rotation    Knee flexion 125 125  Knee extension 0 0  Ankle dorsiflexion    Ankle plantarflexion    Ankle inversion    Ankle eversion     (Blank rows = not tested)    Flexability: increased tightness noted in hamstrings and quads bilat  FUNCTIONAL TESTS:  5 times sit to stand: 15 sec                                                                                                                               TREATMENT DATE:  05/11/24 Therex Nu step L5 X 10.5 min UE/LE True ride bike L1 X 10 min Seated SLR 2# 2X10 bilat Seated hamstring stretch 30 sec X 3 bilat Standing quad stretch 30 sec X 2 bilat  Theractivity Leg press machine DL 74# [id number] Leg extension machine DL 74# [id number] Standing hip abductions red 2X10 Sidestepping with mini squat 10 feet X 4 with red band (added into HEP)  05/06/24 Therex Nu step L5 X 10 min UE/LE True ride bike L1 X 10 min Seated hamstring stretch 30 sec X 3 bilat Standing quad stretch 30 sec X 2 bilat   Theractivity Leg press machine DL 74# [id number] Leg extension machine DL 74# [id number] Hamstring curl machine DL 59# [id number] Calf raise machine DL 59# [id number]   PATIENT EDUCATION: Education  details: HEP, PT plan of care Person educated: Patient Education method: Explanation, Demonstration, Verbal cues, and Handouts Education comprehension: verbalized understanding, further education recommended   HOME EXERCISE PROGRAM: Access Code: [email or web address] URL: [email or web address] Date: 04/20/2024 Prepared by: Redell Moose  Exercises - Seated Straight Leg Raise with Quad Contraction  - 2 x daily - 6 x weekly - 3 sets - 10 reps - Sit to Stand Without Arm Support  - 2 x daily - 6 x weekly - 1-2 sets - 5 reps - Seated Knee Extension with Resistance  - 2 x daily - 6 x weekly - 2 sets - 10 reps - Standing Hip Abduction with Resistance at Ankles and Counter Support  - 2 x daily - 6 x weekly - 2 sets - 10 reps - Standing Quad Stretch with Strap  - 2 x daily - 6 x weekly - 1 sets - 3 reps - 30 sec hold - Seated Hamstring Stretch  - 2 x daily - 6 x weekly - 1 sets - 3 reps - 30 sec hold -Sidestepping with mini squat 10 feet X 4 with red band (added into HEP 7/2/5)  ASSESSMENT:  CLINICAL IMPRESSION:  She had some cramping in her calves last time so I discontinued the calf raise machine and also the hamstring machine today. Instead  focused on quad and hip strength with good tolerance noted. I did add sidestepping with mini squat into HEP and encouraged her to only squat in mini ROM as to not increase in any pain. She verbalizes understanding and shows good return demonstration.     Per Eval:  Patient referred to PT for chronic Rt knee pain. MRI IMPRESSION:Mild degenerative arthrosis with mild chondromalacia. No full-thickness cartilage defect is significant subchondral reactive small reactive joint effusion. There is mild inflammation of the suprapatellar fat pad. This can be seen with anterior suprapatellar fat pad impingement. No ligamentous or meniscal injury is identified. She does lack strength in her knees/hips and has some tightness noted in hamstrings and quads. She also has chronic pain in left knee and will have upcoming MRI for this. Patient will benefit from skilled PT to improve overall function and to address impairments and limitations listed below.  OBJECTIVE IMPAIRMENTS: decreased activity tolerance for ADL's, difficulty walking, decreased endurance, decreased mobility, decreased ROM, decreased strength, impaired flexibility, impaired LE use, and pain.  ACTIVITY LIMITATIONS: bending, lifting, stairs, locomotion,   PERSONAL FACTORS: see above PMH  also affecting patient's functional outcome.  REHAB POTENTIAL: Fair , chronic nature of pain with previous surgical history  CLINICAL DECISION MAKING: Stable/uncomplicated  EVALUATION COMPLEXITY: Low    GOALS: Short term PT Goals Target date: 05/18/2024   Pt will be I and compliant with HEP. Baseline:  Goal status: MET 05/11/24 Pt will decrease pain by 25% overall with walking Baseline:6/10 Goal status: ongoing 05/11/24  Long term PT goals Target date:06/01/2024  Pt will improve strength to at least 4+/5 MMT to improve functional strength Baseline:4/5 Goal status: New Pt will improve Patient specific functional scale (PSFS) to at least 7/10 to show  improved function level Baseline:5/10 Goal status: New Pt will reduce pain to overall less than 3/10 with usual activity and walking Baseline:6/10 Goal status: New Pt will improve 5 times sit to stand test to less than 13 seconds Baseline:15 seconds Goal status: New  PLAN: PT FREQUENCY: 1-2 times per week   PT DURATION: 6 weeks  PLANNED INTERVENTIONS (unless contraindicated): 97110-Therapeutic exercises, 97530- Therapeutic activity, [id number]- Neuromuscular re-education, 97535-  Self Care, 02859- Manual therapy, [id number]- Gait training, [phone]- Electrical stimulation (unattended), [id number]- Ultrasound, and 97033- Ionotophoresis 4mg /ml Dexamethasone   PLAN FOR NEXT SESSION: knee/hip  strength focus and stretching as tolerated. NEXT MD VISIT: 06/01/24  Redell JONELLE Moose, PT,DPT 05/11/2024, 1:31 PM

## 2024-05-12 ENCOUNTER — Encounter: Admitting: Physical Therapy

## 2024-05-18 ENCOUNTER — Encounter: Payer: Self-pay | Admitting: Physical Therapy

## 2024-05-18 ENCOUNTER — Ambulatory Visit: Admitting: Physical Therapy

## 2024-05-18 DIAGNOSIS — G8929 Other chronic pain: Secondary | ICD-10-CM

## 2024-05-18 DIAGNOSIS — M25561 Pain in right knee: Secondary | ICD-10-CM | POA: Diagnosis not present

## 2024-05-18 DIAGNOSIS — M6281 Muscle weakness (generalized): Secondary | ICD-10-CM

## 2024-05-18 DIAGNOSIS — R262 Difficulty in walking, not elsewhere classified: Secondary | ICD-10-CM

## 2024-05-18 NOTE — Therapy (Signed)
 OUTPATIENT PHYSICAL THERAPY TREATMENT   Patient Name: Hannah Lynch MRN: [id number] DOB:02-27-1971, 53 y.o., female Today's Date: 05/18/2024  END OF SESSION:  PT End of Session - 05/18/24 1151     Visit Number 4    Number of Visits 8    Date for PT Re-Evaluation 06/01/24    Authorization Type [employer] MCD    Authorization - Visit Number 4    Authorization - Number of Visits 27    Progress Note Due on Visit 8    PT Start Time 1100    PT Stop Time 1145    PT Time Calculation (min) 45 min    Behavior During Therapy WFL for tasks assessed/performed             Past Medical History:  Diagnosis Date   Anxiety    Degenerative disc disease, lumbar    Degenerative disc disease, lumbar 2012   lumbar and cervical region   Depression    Depression, major, single episode, moderate (HCC) 08/24/2023   Endometriosis    Family history of adverse reaction to anesthesia    pt's mother has hx. of post-op N/V   Headache    History of hiatal hernia    Hypertension    Hypothyroidism    IBS (irritable bowel syndrome)    Impingement syndrome of right shoulder 08/20/2022   Nasal septal deviation 11/2017   Nasal turbinate hypertrophy 11/2017   PONV (postoperative nausea and vomiting)    Snoring 11/2017   Trochanteric bursitis, left hip 08/30/2020   Past Surgical History:  Procedure Laterality Date   ABDOMINAL HYSTERECTOMY  2005   partial - [facility], no cervix, has both ovaries; done for endometriosis   ANTERIOR CERVICAL DECOMP/DISCECTOMY FUSION  03/25/2007   C4-5   CERVICAL FUSION  2013   CHOLECYSTECTOMY N/A 03/26/2018   Procedure: LAPAROSCOPIC CHOLECYSTECTOMY;  Surgeon: Mavis Anes, MD;  Location: [facility];  Service: General;  Laterality: N/A;   COLONOSCOPY     HIATAL HERNIA REPAIR     completed with gastric sleeve   KNEE ARTHROSCOPY Left    LAPAROSCOPIC GASTRIC SLEEVE RESECTION N/A 09/27/2019   Procedure: LAPAROSCOPIC GASTRIC SLEEVE RESECTION, HIATAL HERNIA REPARI ;Upper Endo, Eras  Pathway;  Surgeon: Tanda Locus, MD;  Location: [facility];  Service: General;  Laterality: N/A;   LUMBAR LAMINECTOMY/DECOMPRESSION MICRODISCECTOMY  11/21/2004   L4-5   MAXIMUM ACCESS (MAS)POSTERIOR LUMBAR INTERBODY FUSION (PLIF) 1 LEVEL N/A 09/25/2014   Procedure: Lumbar four-five MAXIMUM ACCESS (MAS) POSTERIOR LUMBAR INTERBODY FUSION (PLIF) 1 LEVEL;  Surgeon: Fairy Levels, MD;  Location: [facility];  Service: Neurosurgery;  Laterality: N/A;   NASAL SEPTOPLASTY W/ TURBINOPLASTY N/A 12/07/2017   Procedure: NASAL SEPTOPLASTY WITH BILATERAL TURBINATE REDUCTION;  Surgeon: Carlie Clark, MD;  Location: [facility];  Service: ENT;  Laterality: N/A;   ORIF ANKLE FRACTURE Right    TONSILLECTOMY  06/2018   UPPER GI ENDOSCOPY     Patient Active Problem List   Diagnosis Date Noted   Hx of sinus tachycardia 12/22/2023   CKD (chronic kidney disease) stage 2, GFR 60-89 ml/min 12/22/2023   Anxiety 08/24/2023   DDD (degenerative disc disease), cervical 05/20/2023   Chronic neck and back pain 05/20/2023   Arthritis of right acromioclavicular joint 05/10/2023   Bursitis of right shoulder 05/10/2023   Palpitations 11/26/2022   Lumbar radiculopathy 05/02/2021   History of left heart catheterization 10/16/2020   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 08/30/2020   Migraine  without status migrainosus, not intractable 11/15/2019   GERD (gastroesophageal reflux disease) 09/27/2019   Essential hypertension 09/27/2019   Fatty liver 09/27/2019   Bilateral sensorineural hearing loss 07/14/2019   Arthropathy of cervical facet joint 08/19/2018   Lumbar facet joint pain 08/19/2018   History of staph infection 07/20/2018   Tonsillar hypertrophy 05/06/2018   Chronic cholecystitis    Hypothyroidism 12/24/2017   Nasal septal deviation 10/22/2017   Nasal turbinate hypertrophy 10/22/2017   Snoring 10/22/2017   Primary insomnia 06/25/2017   Bilateral hearing loss 06/25/2017   Lumbar  stenosis with neurogenic claudication 09/25/2014    PCP: Knute Thersia Bitters, FNP   REFERRING PROVIDER: Shirly Carlin CROME, PA-C    REFERRING DIAG: [id number] (ICD-10-CM) - Chronic pain of right knee   Rationale for Evaluation and Treatment:  Rehabiliation  THERAPY DIAG:  Chronic pain of right knee  Chronic pain of left knee  Muscle weakness (generalized)  Difficulty in walking, not elsewhere classified  ONSET DATE: 4-5 months of knee pain   SUBJECTIVE:                                                                                                                                                                                           SUBJECTIVE STATEMENT: She relays her calf is better, did have some increased pain in right knee after last session.   PERTINENT HISTORY:  See above PMH and surgical history  PAIN:  NPRS scale: 3/10 upon arrival in right knee, says left knee is more than that but did not rate Pain location:bilat knees, top and also medial Pain description: constant, sharp pains.  Aggravating factors: stairs, turning Relieving factors: rest, meds   PRECAUTIONS: ,  None  RED FLAGS: None   WEIGHT BEARING RESTRICTIONS:  No  FALLS:  Has patient fallen in last 6 months? No   OCCUPATION:  none  PLOF:  Independent  PATIENT GOALS:  Reduce the pain  OBJECTIVE:  Note: Objective measures were completed at Evaluation unless otherwise noted.  DIAGNOSTIC FINDINGS:  05/09/24 Left knee MRI IMPRESSION: Prior meniscectomy and/or chronic degenerative injury to the lateral meniscus.   Mild-to-moderate tricompartmental chondromalacia greatest the lateral compartment. No significant degenerative edema.   Small joint effusion and small popliteal cyst.   Mild semimembranosus bursal fluid.   Right knee MRI 04/06/24 IMPRESSION: Mild degenerative arthrosis with mild chondromalacia. No full-thickness cartilage defect is significant subchondral  reactive small reactive joint effusion.   There is mild inflammation of the suprapatellar fat pad. This can be seen with anterior suprapatellar fat pad impingement.   No ligamentous or meniscal injury is identified.  PATIENT SURVEYS:  Patient-Specific Activity Scoring Scheme  0 represents "unable to perform." 10 represents "able to perform at prior level. 0 1 2 3 4 5 6 7 8 9  10 (Date and Score)   Activity Eval     1. Going up steps 5    2. Walking for long periods 5    3. Sitting for too long 5   4.    5.    Score 5/10    Total score = sum of the activity scores/number of activities Minimum detectable change (90%CI) for average score = 2 points Minimum detectable change (90%CI) for single activity score = 3 points   GAIT: Distance walked: limited community distances due to pain Assistive device utilized: None Level of assistance: Complete Independence Comments: decreased hip/knee flexion during swing phase  Knee  LOWER EXTREMITY strength:     MMT Right eval Left eval  Hip flexion 4+ 4+  Hip extension    Hip abduction 3 3  Hip adduction    Hip internal rotation    Hip external rotation    Knee flexion 4 4  Knee extension 4 4  Ankle dorsiflexion    Ankle plantarflexion    Ankle inversion    Ankle eversion     (Blank rows = not tested)   LOWER EXTREMITY ROM:    AROM Right eval Left eval  Hip flexion    Hip extension    Hip abduction    Hip adduction    Hip internal rotation    Hip external rotation    Knee flexion 125 125  Knee extension 0 0  Ankle dorsiflexion    Ankle plantarflexion    Ankle inversion    Ankle eversion     (Blank rows = not tested)    Flexability: increased tightness noted in hamstrings and quads bilat  FUNCTIONAL TESTS:  5 times sit to stand: 15 sec                                                                                                                              TREATMENT DATE:  05/18/24 Therex Nu step  L5 X 10.5 min UE/LE True ride bike L1 X 10 min Seated SLR  2X10 bilat Elliptical 5 min L1 Seated hamstring stretch 30 sec X 3 bilat Standing quad stretch 30 sec X 2 bilat   Theractivity Leg press machine DL 74# [id number] Leg extension machine DL 74# [id number] Hamstring curl machine 20# 2X10  05/11/24 Therex Nu step L5 X 10.5 min UE/LE True ride bike L1 X 10 min Seated SLR 2# 2X10 bilat Seated hamstring stretch 30 sec X 3 bilat Standing quad stretch 30 sec X 2 bilat  Theractivity Leg press machine DL 74# 7K89 Leg extension machine DL 74# [id number] Standing hip abductions red 2X10 Sidestepping with mini squat 10 feet X 4 with red band (added into HEP)  05/11/24 Therex Nu step L5 X 10.5 min UE/LE True ride bike L1  X 10 min Seated SLR 2# 2X10 bilat Seated hamstring stretch 30 sec X 3 bilat Standing quad stretch 30 sec X 2 bilat  Theractivity Leg press machine DL 74# 7K89 Leg extension machine DL 74# [id number] Standing hip abductions red 2X10 Sidestepping with mini squat 10 feet X 4 with red band (added into HEP)  05/06/24 Therex Nu step L5 X 10 min UE/LE True ride bike L1 X 10 min Seated hamstring stretch 30 sec X 3 bilat Standing quad stretch 30 sec X 2 bilat   Theractivity Leg press machine DL 74# 7K89 Leg extension machine DL 74# 6K89 Hamstring curl machine DL 59# 6K89 Calf raise machine DL 59# [id number]   PATIENT EDUCATION: Education details: HEP, PT plan of care Person educated: Patient Education method: Explanation, Demonstration, Verbal cues, and Handouts Education comprehension: verbalized understanding, further education recommended   HOME EXERCISE PROGRAM: Access Code: [email or web address] URL: [email or web address] Date: 04/20/2024 Prepared by: Redell Moose  Exercises - Seated Straight Leg Raise with Quad Contraction  - 2 x daily - 6 x weekly - 3 sets - 10 reps - Sit to Stand Without Arm Support  - 2 x daily - 6 x weekly - 1-2 sets - 5 reps - Seated Knee Extension  with Resistance  - 2 x daily - 6 x weekly - 2 sets - 10 reps - Standing Hip Abduction with Resistance at Ankles and Counter Support  - 2 x daily - 6 x weekly - 2 sets - 10 reps - Standing Quad Stretch with Strap  - 2 x daily - 6 x weekly - 1 sets - 3 reps - 30 sec hold - Seated Hamstring Stretch  - 2 x daily - 6 x weekly - 1 sets - 3 reps - 30 sec hold -Sidestepping with mini squat 10 feet X 4 with red band (added into HEP 7/2/5)  ASSESSMENT:  CLINICAL IMPRESSION:  She had some pain and soreness in right knee after last time so backed down some on the intensity of her program today and avoided the squat exercise today to see if this was what aggravated her symptoms last time.    Per Eval:  Patient referred to PT for chronic Rt knee pain. MRI IMPRESSION:Mild degenerative arthrosis with mild chondromalacia. No full-thickness cartilage defect is significant subchondral reactive small reactive joint effusion. There is mild inflammation of the suprapatellar fat pad. This can be seen with anterior suprapatellar fat pad impingement. No ligamentous or meniscal injury is identified. She does lack strength in her knees/hips and has some tightness noted in hamstrings and quads. She also has chronic pain in left knee and will have upcoming MRI for this. Patient will benefit from skilled PT to improve overall function and to address impairments and limitations listed below.  OBJECTIVE IMPAIRMENTS: decreased activity tolerance for ADL's, difficulty walking, decreased endurance, decreased mobility, decreased ROM, decreased strength, impaired flexibility, impaired LE use, and pain.  ACTIVITY LIMITATIONS: bending, lifting, stairs, locomotion,   PERSONAL FACTORS: see above PMH  also affecting patient's functional outcome.  REHAB POTENTIAL: Fair , chronic nature of pain with previous surgical history  CLINICAL DECISION MAKING: Stable/uncomplicated  EVALUATION COMPLEXITY: Low    GOALS: Short term PT  Goals Target date: 05/18/2024   Pt will be I and compliant with HEP. Baseline:  Goal status: MET 05/11/24 Pt will decrease pain by 25% overall with walking Baseline:6/10 Goal status: ongoing 05/11/24  Long term PT goals Target date:06/01/2024  Pt will improve strength to at least 4+/5  MMT to improve functional strength Baseline:4/5 Goal status: New Pt will improve Patient specific functional scale (PSFS) to at least 7/10 to show improved function level Baseline:5/10 Goal status: New Pt will reduce pain to overall less than 3/10 with usual activity and walking Baseline:6/10 Goal status: New Pt will improve 5 times sit to stand test to less than 13 seconds Baseline:15 seconds Goal status: New  PLAN: PT FREQUENCY: 1-2 times per week   PT DURATION: 6 weeks  PLANNED INTERVENTIONS (unless contraindicated): 97110-Therapeutic exercises, 97530- Therapeutic activity, [id number]- Neuromuscular re-education, 97535- Self Care, [id number]- Manual therapy, [id number]- Gait training, H9716- Electrical stimulation (unattended), 97035- Ultrasound, and 97033- Ionotophoresis 4mg /ml Dexamethasone   PLAN FOR NEXT SESSION:  Updated measurments knee/hip  strength focus and stretching as tolerated. NEXT MD VISIT: 06/01/24  Redell JONELLE Moose, PT,DPT 05/18/2024, 11:53 AM

## 2024-05-24 ENCOUNTER — Ambulatory Visit: Admitting: Physical Therapy

## 2024-05-24 ENCOUNTER — Encounter: Payer: Self-pay | Admitting: Physical Therapy

## 2024-05-24 DIAGNOSIS — G8929 Other chronic pain: Secondary | ICD-10-CM

## 2024-05-24 DIAGNOSIS — M25561 Pain in right knee: Secondary | ICD-10-CM | POA: Diagnosis not present

## 2024-05-24 DIAGNOSIS — M6281 Muscle weakness (generalized): Secondary | ICD-10-CM

## 2024-05-24 DIAGNOSIS — R262 Difficulty in walking, not elsewhere classified: Secondary | ICD-10-CM

## 2024-05-24 NOTE — Therapy (Signed)
 OUTPATIENT PHYSICAL THERAPY TREATMENT   Patient Name: Hannah Lynch MRN: [id number] DOB:31-Jan-1971, 53 y.o., female Today's Date: 05/24/2024  END OF SESSION:  PT End of Session - 05/24/24 1110     Visit Number 5    Number of Visits 8    Date for PT Re-Evaluation 06/01/24    Authorization Type [employer] MCD    Authorization - Visit Number 5    Authorization - Number of Visits 27    Progress Note Due on Visit 8    PT Start Time 1100    PT Stop Time 1140    PT Time Calculation (min) 40 min    Activity Tolerance Patient tolerated treatment well    Behavior During Therapy WFL for tasks assessed/performed             Past Medical History:  Diagnosis Date   Anxiety    Degenerative disc disease, lumbar    Degenerative disc disease, lumbar 2012   lumbar and cervical region   Depression    Depression, major, single episode, moderate (HCC) 08/24/2023   Endometriosis    Family history of adverse reaction to anesthesia    pt's mother has hx. of post-op N/V   Headache    History of hiatal hernia    Hypertension    Hypothyroidism    IBS (irritable bowel syndrome)    Impingement syndrome of right shoulder 08/20/2022   Nasal septal deviation 11/2017   Nasal turbinate hypertrophy 11/2017   PONV (postoperative nausea and vomiting)    Snoring 11/2017   Trochanteric bursitis, left hip 08/30/2020   Past Surgical History:  Procedure Laterality Date   ABDOMINAL HYSTERECTOMY  2005   partial - TAH, no cervix, has both ovaries; done for endometriosis   ANTERIOR CERVICAL DECOMP/DISCECTOMY FUSION  03/25/2007   C4-5   CERVICAL FUSION  2013   CHOLECYSTECTOMY N/A 03/26/2018   Procedure: LAPAROSCOPIC CHOLECYSTECTOMY;  Surgeon: Mavis Anes, MD;  Location: [facility];  Service: General;  Laterality: N/A;   COLONOSCOPY     HIATAL HERNIA REPAIR     completed with gastric sleeve   KNEE ARTHROSCOPY Left    LAPAROSCOPIC GASTRIC SLEEVE RESECTION N/A 09/27/2019   Procedure: LAPAROSCOPIC GASTRIC  SLEEVE RESECTION, HIATAL HERNIA REPARI ;Upper Endo, Eras Pathway;  Surgeon: Tanda Locus, MD;  Location: [facility];  Service: General;  Laterality: N/A;   LUMBAR LAMINECTOMY/DECOMPRESSION MICRODISCECTOMY  11/21/2004   L4-5   MAXIMUM ACCESS (MAS)POSTERIOR LUMBAR INTERBODY FUSION (PLIF) 1 LEVEL N/A 09/25/2014   Procedure: Lumbar four-five MAXIMUM ACCESS (MAS) POSTERIOR LUMBAR INTERBODY FUSION (PLIF) 1 LEVEL;  Surgeon: Fairy Levels, MD;  Location: [facility];  Service: Neurosurgery;  Laterality: N/A;   NASAL SEPTOPLASTY W/ TURBINOPLASTY N/A 12/07/2017   Procedure: NASAL SEPTOPLASTY WITH BILATERAL TURBINATE REDUCTION;  Surgeon: Carlie Clark, MD;  Location:  [facility];  Service: ENT;  Laterality: N/A;   ORIF ANKLE FRACTURE Right    TONSILLECTOMY  06/2018   UPPER GI ENDOSCOPY     Patient Active Problem List   Diagnosis Date Noted   Hx of sinus tachycardia 12/22/2023   CKD (chronic kidney disease) stage 2, GFR 60-89 ml/min 12/22/2023   Anxiety 08/24/2023   DDD (degenerative disc disease), cervical 05/20/2023   Chronic neck and back pain 05/20/2023   Arthritis of right acromioclavicular joint 05/10/2023   Bursitis of right shoulder 05/10/2023   Palpitations 11/26/2022   Lumbar radiculopathy 05/02/2021   History of left heart catheterization 10/16/2020   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 08/30/2020   Migraine without status migrainosus, not intractable 11/15/2019   GERD (gastroesophageal reflux disease) 09/27/2019   Essential hypertension 09/27/2019   Fatty liver 09/27/2019   Bilateral sensorineural hearing loss 07/14/2019   Arthropathy of cervical facet joint 08/19/2018   Lumbar facet joint pain 08/19/2018   History of staph infection 07/20/2018   Tonsillar hypertrophy 05/06/2018   Chronic cholecystitis    Hypothyroidism 12/24/2017   Nasal septal deviation 10/22/2017   Nasal turbinate hypertrophy 10/22/2017   Snoring 10/22/2017   Primary insomnia  06/25/2017   Bilateral hearing loss 06/25/2017   Lumbar stenosis with neurogenic claudication 09/25/2014    PCP: Knute Thersia Bitters, FNP   REFERRING PROVIDER: Shirly Carlin CROME, PA-C    REFERRING DIAG: [phone] (ICD-10-CM) - Chronic pain of right knee   Rationale for Evaluation and Treatment:  Rehabiliation  THERAPY DIAG:  Chronic pain of right knee  Chronic pain of left knee  Muscle weakness (generalized)  Difficulty in walking, not elsewhere classified  ONSET DATE: 4-5 months of knee pain   SUBJECTIVE:                                                                                                                                                                                           SUBJECTIVE STATEMENT: She relays her pain does feel better today, 2/10 in general. She wants to hold off on scheduling more PT until she sees MD next week.   PERTINENT HISTORY:  See above PMH and surgical history  PAIN:  NPRS scale:2/10 today Pain location:bilat knees, top and also medial Pain description: constant, sharp pains.  Aggravating factors: stairs, turning Relieving factors: rest, meds   PRECAUTIONS: ,  None  RED FLAGS: None   WEIGHT BEARING RESTRICTIONS:  No  FALLS:  Has patient fallen in last 6 months? No   OCCUPATION:  none  PLOF:  Independent  PATIENT GOALS:  Reduce the pain  OBJECTIVE:  Note: Objective measures were completed at Evaluation unless otherwise noted.  DIAGNOSTIC FINDINGS:  05/09/24 Left knee MRI IMPRESSION: Prior meniscectomy and/or chronic degenerative injury to the lateral meniscus.   Mild-to-moderate tricompartmental chondromalacia greatest the lateral compartment. No significant degenerative edema.   Small joint effusion and small popliteal cyst.   Mild semimembranosus bursal fluid.   Right knee MRI 04/06/24 IMPRESSION: Mild degenerative arthrosis with mild chondromalacia. No full-thickness cartilage defect is  significant subchondral reactive small reactive joint effusion.   There is mild inflammation of the suprapatellar fat pad. This can be seen with anterior suprapatellar fat pad impingement.   No ligamentous or meniscal injury is identified.  PATIENT  SURVEYS:  Patient-Specific Activity Scoring Scheme  0 represents "unable to perform." 10 represents "able to perform at prior level. 0 1 2 3 4 5 6 7 8 9  10 (Date and Score)   Activity Eval     1. Going up steps 5    2. Walking for long periods 5    3. Sitting for too long 5   4.    5.    Score 5/10    Total score = sum of the activity scores/number of activities Minimum detectable change (90%CI) for average score = 2 points Minimum detectable change (90%CI) for single activity score = 3 points   GAIT: Distance walked: limited community distances due to pain Assistive device utilized: None Level of assistance: Complete Independence Comments: decreased hip/knee flexion during swing phase  Knee  LOWER EXTREMITY strength:     MMT Right eval Left eval Right 05/24/24 Left 06/03/24  Hip flexion 4+ 4+    Hip extension      Hip abduction 3 3    Hip adduction      Hip internal rotation      Hip external rotation      Knee flexion 4 4 4+ 4+  Knee extension 4 4 4+ 4+  Ankle dorsiflexion      Ankle plantarflexion      Ankle inversion      Ankle eversion       (Blank rows = not tested)   LOWER EXTREMITY ROM:    AROM Right eval Left eval  Hip flexion    Hip extension    Hip abduction    Hip adduction    Hip internal rotation    Hip external rotation    Knee flexion 125 125  Knee extension 0 0  Ankle dorsiflexion    Ankle plantarflexion    Ankle inversion    Ankle eversion     (Blank rows = not tested)    Flexability: increased tightness noted in hamstrings and quads bilat  FUNCTIONAL TESTS:  5 times sit to stand: 15 sec                                                                                                                               TREATMENT DATE:  05/24/24 Therex Nu step L5 X 10 min UE/LE True ride bike L1 X 10 min Seated SLR  2X10 bilat Elliptical 5 min L1 Seated hamstring stretch 30 sec X 3 bilat Standing quad stretch 30 sec X 2 bilat   Theractivity Leg press machine DL 74# [id number] Leg extension machine DL 74# [id number] Hamstring curl machine 20# 2X10   05/18/24 Therex Nu step L5 X 10.5 min UE/LE True ride bike L1 X 10 min Seated SLR  2X10 bilat Elliptical 5 min L1 Seated hamstring stretch 30 sec X 3 bilat Standing quad stretch 30 sec X 2 bilat   Theractivity Leg press machine DL 74# [id number] Leg extension machine  DL 74# [id number] Hamstring curl machine 20# 2X10  05/11/24 Therex Nu step L5 X 10.5 min UE/LE True ride bike L1 X 10 min Seated SLR 2# 2X10 bilat Seated hamstring stretch 30 sec X 3 bilat Standing quad stretch 30 sec X 2 bilat  Theractivity Leg press machine DL 74# [id number] Leg extension machine DL 74# [id number] Standing hip abductions red 2X10 Sidestepping with mini squat 10 feet X 4 with red band (added into HEP)    PATIENT EDUCATION: Education details: HEP, PT plan of care Person educated: Patient Education method: Explanation, Demonstration, Verbal cues, and Handouts Education comprehension: verbalized understanding, further education recommended   HOME EXERCISE PROGRAM: Access Code: [email or web address] URL: [email or web address] Date: 04/20/2024 Prepared by: Redell Moose  Exercises - Seated Straight Leg Raise with Quad Contraction  - 2 x daily - 6 x weekly - 3 sets - 10 reps - Sit to Stand Without Arm Support  - 2 x daily - 6 x weekly - 1-2 sets - 5 reps - Seated Knee Extension with Resistance  - 2 x daily - 6 x weekly - 2 sets - 10 reps - Standing Hip Abduction with Resistance at Ankles and Counter Support  - 2 x daily - 6 x weekly - 2 sets - 10 reps - Standing Quad Stretch with Strap  - 2 x daily - 6 x weekly - 1 sets - 3 reps - 30 sec hold -  Seated Hamstring Stretch  - 2 x daily - 6 x weekly - 1 sets - 3 reps - 30 sec hold -Sidestepping with mini squat 10 feet X 4 with red band (added into HEP 7/2/5)  ASSESSMENT:  CLINICAL IMPRESSION:  She had better exercise tolerance from last session which was backed down some. Repeated same routine today and she did well with this too. Overall has improved her pain some and improve her knee strength but still with knee pain that fluctuates. We discussed that she will follow up with MD next week and then she may set up more PT after that if she wants or she can transition to independent program and continue on at home with HEP if she would rather do that. She will call our office if she decides to set up any more PT.    Per Eval:  Patient referred to PT for chronic Rt knee pain. MRI IMPRESSION:Mild degenerative arthrosis with mild chondromalacia. No full-thickness cartilage defect is significant subchondral reactive small reactive joint effusion. There is mild inflammation of the suprapatellar fat pad. This can be seen with anterior suprapatellar fat pad impingement. No ligamentous or meniscal injury is identified. She does lack strength in her knees/hips and has some tightness noted in hamstrings and quads. She also has chronic pain in left knee and will have upcoming MRI for this. Patient will benefit from skilled PT to improve overall function and to address impairments and limitations listed below.  OBJECTIVE IMPAIRMENTS: decreased activity tolerance for ADL's, difficulty walking, decreased endurance, decreased mobility, decreased ROM, decreased strength, impaired flexibility, impaired LE use, and pain.  ACTIVITY LIMITATIONS: bending, lifting, stairs, locomotion,   PERSONAL FACTORS: see above PMH  also affecting patient's functional outcome.  REHAB POTENTIAL: Fair , chronic nature of pain with previous surgical history  CLINICAL DECISION MAKING: Stable/uncomplicated  EVALUATION  COMPLEXITY: Low    GOALS: Short term PT Goals Target date: 05/18/2024   Pt will be I and compliant with HEP. Baseline:  Goal status: MET 05/11/24 Pt will decrease pain by 25% overall  with walking Baseline:6/10 Goal status: MET 05/24/24  Long term PT goals Target date:06/01/2024  Pt will improve strength to at least 4+/5 MMT to improve functional strength Baseline:4/5 Goal status: MET 05/24/24 Pt will improve Patient specific functional scale (PSFS) to at least 7/10 to show improved function level Baseline:5/10 Goal status: ongoing 05/24/24 Pt will reduce pain to overall less than 3/10 with usual activity and walking Baseline:6/10 Goal status: ongoing 05/24/24 Pt will improve 5 times sit to stand test to less than 13 seconds Baseline:15 seconds Goal status: ongoing 05/24/24  PLAN: PT FREQUENCY: 1-2 times per week   PT DURATION: 6 weeks  PLANNED INTERVENTIONS (unless contraindicated): 97110-Therapeutic exercises, 97530- Therapeutic activity, 97112- Neuromuscular re-education, 97535- Self Care, 02859- Manual therapy, [id number]- Gait training, H9716- Electrical stimulation (unattended), 97035- Ultrasound, and 02966- Ionotophoresis 4mg /ml Dexamethasone   PLAN FOR NEXT SESSION:  knee/hip  strength focus and stretching as tolerated. NEXT MD VISIT: 06/01/24  Redell JONELLE Moose, PT,DPT 05/24/2024, 11:52 AM

## 2024-05-27 ENCOUNTER — Ambulatory Visit: Admitting: Physical Therapy

## 2024-06-01 ENCOUNTER — Ambulatory Visit: Admitting: Orthopedic Surgery

## 2024-06-01 DIAGNOSIS — M1712 Unilateral primary osteoarthritis, left knee: Secondary | ICD-10-CM

## 2024-06-02 ENCOUNTER — Encounter: Payer: Self-pay | Admitting: Orthopedic Surgery

## 2024-06-02 NOTE — Progress Notes (Signed)
 Office Visit Note   Patient: Hannah Lynch           Date of Birth: September 29, 1971           MRN: [id number] Visit Date: 06/01/2024 Requested by: Knute Thersia Bitters, FNP [address] [place],  [place] [address] PCP: Knute Thersia Bitters, FNP  Subjective: Chief Complaint  Patient presents with   Other    Scan review    HPI: Hannah Lynch is a 53 y.o. female who presents to the office reporting left knee pain.  Since she was last seen she has had an MRI scan which shows this prior meniscectomy and chronic degenerative injury to the lateral meniscus with mild to moderate tricompartmental chondromalacia greatest in the lateral compartment.  Nothing really arthroscopically treatable in that left knee.  Patient describes pain at rest.  Heart for some long time.  Lateral side hurts worse in the medial side.  Injection not helpful.  She lives with her parents.  She has gone to physical therapy for that knee..                ROS: All systems reviewed are negative as they relate to the chief complaint within the history of present illness.  Patient denies fevers or chills.  Assessment & Plan: Visit Diagnoses:  1. Arthritis of left knee     Plan: Impression is arthritis in the left knee.  Worse in the lateral compartment.  Patient is having symptoms which are putting her in the category of potentially needing knee replacement.  She is young for that but no other real options with failure of injections as well as quad strengthening program.  Risk and benefits of knee replacement discussed with the patient detail infection is an average annually as well as incomplete restoration of function.  The rigorous nature of the rehabilitative process is discussed.  All questions answered.  She will let us  know if she wants to proceed with any further intervention.  Follow-Up Instructions: No follow-ups on file.   Orders:  No orders of the defined types were placed in this  encounter.  No orders of the defined types were placed in this encounter.     Procedures: No procedures performed   Clinical Data: No additional findings.  Objective: Vital Signs: There were no vitals taken for this visit.  Physical Exam:  Constitutional: Patient appears well-developed HEENT:  Head: Normocephalic Eyes:EOM are normal Neck: Normal range of motion Cardiovascular: Normal rate Pulmonary/chest: Effort normal Neurologic: Patient is alert Skin: Skin is warm Psychiatric: Patient has normal mood and affect  Ortho Exam: Left leg is examined.  Patient does have no effusion and full extension.  Flexes to about 120.  Lateral medial joint line tenderness but no overt valgus alignment.  Pedal pulses palpable.  Ankle monitor present.  Patella tracking is normal.  No masses lymphadenopathy or skin changes noted in the left knee region  Specialty Comments:  No specialty comments available.  Imaging: No results found.   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 08/30/2020   Migraine without status migrainosus, not intractable 11/15/2019   GERD (gastroesophageal reflux disease) 09/27/2019   Essential hypertension 09/27/2019   Fatty liver 09/27/2019   Bilateral sensorineural hearing loss 07/14/2019   Arthropathy of cervical facet joint 08/19/2018   Lumbar facet joint pain 08/19/2018   History of staph infection 07/20/2018   Tonsillar hypertrophy 05/06/2018    Chronic cholecystitis    Hypothyroidism 12/24/2017   Nasal septal deviation 10/22/2017   Nasal turbinate hypertrophy 10/22/2017   Snoring 10/22/2017   Primary insomnia 06/25/2017   Bilateral hearing loss 06/25/2017   Lumbar stenosis with neurogenic claudication 09/25/2014   Past Medical History:  Diagnosis Date   Anxiety    Degenerative disc disease, lumbar    Degenerative disc disease, lumbar 2012   lumbar and cervical region   Depression    Depression, major, single episode, moderate (HCC) 08/24/2023   Endometriosis    Family history of adverse reaction to anesthesia    pt's mother has hx. of post-op N/V   Headache    History of hiatal hernia    Hypertension    Hypothyroidism    IBS (irritable bowel syndrome)    Impingement syndrome of right shoulder 08/20/2022   Nasal septal deviation 11/2017   Nasal turbinate hypertrophy 11/2017   PONV (postoperative nausea and vomiting)    Snoring 11/2017   Trochanteric bursitis, left hip 08/30/2020    Family History  Problem Relation Age of Onset   Hypertension Mother    Diabetes Mother    Heart failure Father    Hypertension Father    Diabetes Father    Heart failure Other    Breast cancer Paternal Grandmother     Past Surgical History:  Procedure Laterality Date   ABDOMINAL HYSTERECTOMY  2005   partial - TAH, no cervix, has both ovaries; done for endometriosis   ANTERIOR CERVICAL DECOMP/DISCECTOMY FUSION  03/25/2007   C4-5   CERVICAL FUSION  2013   CHOLECYSTECTOMY N/A 03/26/2018   Procedure: LAPAROSCOPIC CHOLECYSTECTOMY;  Surgeon: Mavis Anes, MD;  Location: [facility];  Service: General;  Laterality: N/A;   COLONOSCOPY     HIATAL HERNIA REPAIR     completed with gastric sleeve   KNEE ARTHROSCOPY Left    LAPAROSCOPIC GASTRIC SLEEVE RESECTION N/A 09/27/2019   Procedure: LAPAROSCOPIC GASTRIC SLEEVE RESECTION, HIATAL HERNIA REPARI ;Upper Endo, Eras Pathway;  Surgeon: Tanda Locus, MD;  Location: [facility];  Service: General;   Laterality: N/A;   LUMBAR LAMINECTOMY/DECOMPRESSION MICRODISCECTOMY  11/21/2004   L4-5   MAXIMUM ACCESS (MAS)POSTERIOR LUMBAR INTERBODY FUSION (PLIF) 1 LEVEL N/A 09/25/2014   Procedure: Lumbar four-five MAXIMUM ACCESS (MAS) POSTERIOR LUMBAR INTERBODY FUSION (PLIF) 1 LEVEL;  Surgeon: Fairy Levels, MD;  Location: [facility];  Service: Neurosurgery;  Laterality: N/A;   NASAL SEPTOPLASTY W/ TURBINOPLASTY N/A 12/07/2017   Procedure: NASAL SEPTOPLASTY WITH BILATERAL TURBINATE REDUCTION;  Surgeon: Carlie Clark, MD;  Location: [facility];  Service: ENT;  Laterality: N/A;   ORIF ANKLE FRACTURE Right    TONSILLECTOMY  06/2018   UPPER GI ENDOSCOPY     Social History   Occupational History   Not on file  Tobacco Use   Smoking status: Never   Smokeless tobacco: Never  Vaping Use   Vaping status: Never Used  Substance and Sexual Activity   Alcohol use: Yes    Comment: once in awhile   Drug use: Never  Sexual activity: Yes    Birth control/protection: Surgical    Comment: hyst

## 2024-06-17 ENCOUNTER — Other Ambulatory Visit (HOSPITAL_COMMUNITY): Payer: Self-pay

## 2024-06-17 DIAGNOSIS — M961 Postlaminectomy syndrome, not elsewhere classified: Secondary | ICD-10-CM | POA: Insufficient documentation

## 2024-06-18 ENCOUNTER — Other Ambulatory Visit (HOSPITAL_COMMUNITY): Payer: Self-pay

## 2024-06-18 ENCOUNTER — Encounter (HOSPITAL_COMMUNITY): Payer: Self-pay

## 2024-06-18 MED ORDER — CEPHALEXIN 500 MG PO CAPS
500.0000 mg | ORAL_CAPSULE | Freq: Four times a day (QID) | ORAL | 0 refills | Status: DC
  Filled 2024-06-18: qty 24, 6d supply, fill #0

## 2024-06-27 ENCOUNTER — Other Ambulatory Visit (HOSPITAL_COMMUNITY): Payer: Self-pay

## 2024-06-27 MED ORDER — HYDROCODONE-ACETAMINOPHEN 5-325 MG PO TABS
1.0000 | ORAL_TABLET | Freq: Three times a day (TID) | ORAL | 0 refills | Status: DC | PRN
  Filled 2024-06-27: qty 90, 30d supply, fill #0

## 2024-06-28 ENCOUNTER — Other Ambulatory Visit (HOSPITAL_COMMUNITY): Payer: Self-pay

## 2024-07-04 ENCOUNTER — Ambulatory Visit (HOSPITAL_BASED_OUTPATIENT_CLINIC_OR_DEPARTMENT_OTHER): Admitting: Family Medicine

## 2024-07-04 ENCOUNTER — Encounter (HOSPITAL_BASED_OUTPATIENT_CLINIC_OR_DEPARTMENT_OTHER): Payer: Self-pay | Admitting: *Deleted

## 2024-07-04 ENCOUNTER — Encounter (HOSPITAL_BASED_OUTPATIENT_CLINIC_OR_DEPARTMENT_OTHER): Payer: Self-pay | Admitting: Family Medicine

## 2024-07-04 VITALS — BP 117/97 | HR 116 | Ht 63.0 in | Wt 183.3 lb

## 2024-07-04 DIAGNOSIS — U071 COVID-19: Secondary | ICD-10-CM | POA: Diagnosis not present

## 2024-07-04 DIAGNOSIS — R051 Acute cough: Secondary | ICD-10-CM | POA: Diagnosis not present

## 2024-07-04 LAB — POC COVID19 BINAXNOW: SARS Coronavirus 2 Ag: POSITIVE — AB

## 2024-07-04 LAB — POCT INFLUENZA A/B
Influenza A, POC: NEGATIVE
Influenza B, POC: NEGATIVE

## 2024-07-04 MED ORDER — ONDANSETRON 8 MG PO TBDP: 8.0000 mg | ORAL_TABLET | Freq: Three times a day (TID) | ORAL | 1 refills | Status: DC | PRN

## 2024-07-04 MED ORDER — BENZONATATE 200 MG PO CAPS: 200.0000 mg | ORAL_CAPSULE | Freq: Three times a day (TID) | ORAL | 0 refills | Status: DC | PRN

## 2024-07-04 NOTE — Progress Notes (Signed)
    Procedures performed today:    None.  Independent interpretation of notes and tests performed by another provider:   None.  Brief History, Exam, Impression, and Recommendations:    BP (!) 117/97 (BP Location: Left Arm, Patient Position: Sitting, Cuff Size: Normal)   Pulse (!) 116   Ht 5' 3 (1.6 m)   Wt 183 lb 4.8 oz (83.1 kg)   SpO2 100%   BMI 32.47 kg/m   COVID-19 Assessment & Plan: Patient first noted symptoms about 2 to 3 days ago.  Reports that she felt like she was hit by a bus, did have fever over the weekend, last fever recorded was Sunday morning.  She has had bodyaches, mild shortness of breath with activity, intermittent nausea and vomiting, difficulty with keeping food down.  She does report that she had a family travel recently who did end up becoming sick and thinks this may have been where she was exposed.  She has received prior COVID vaccines. On exam, patient is in no acute distress, mild discomfort, vital signs with normal blood pressure, slightly elevated heart rate.  Normal oxygenation.  Cardiovascular exam with slightly tachycardic rate, regular rhythm.  Lungs clear to auscultation bilaterally, no wheezing noted.  Patient able to speak in complete sentences, no obvious dyspnea during office visit.  Abdomen with normal bowel sounds, soft, nontender, nondistended. We discussed considerations, reviewed Paxlovid and indications for this.  Patient would fall into somewhat of a gray area in this regard.  She has noted some improvement since the initial symptom onset.  At this time, we will hold off on Paxlovid.  We can proceed with symptomatic management.  Given nausea with occasional vomiting, we will send in for Zofran  for patient to utilize.  We did discuss trying to maintain appropriate hydration.  Reviewed signs and symptoms of dehydration for patient to be mindful of.  If these do develop, recommend presenting to emergency department for consideration of IV fluids.   Also discussed that if any worsening in breathing occurs, recommend proceeding to ER for further evaluation. We can also use Tessalon  Perles to help with cough.  Did discuss potential use of honey as well, either on its own or mixed with tea. No provided today for work to keep her out of work through Wednesday.  Discussed that if still with notable symptoms or if developing any fevers, to let us  know and we can extend work note for her.   Acute cough -     POCT Influenza A/B -     POC COVID-19 BinaxNow  Other orders -     Benzonatate ; Take 1 capsule (200 mg total) by mouth 3 (three) times daily as needed for cough.  Dispense: 45 capsule; Refill: 0 -     Ondansetron ; Take 1 tablet (8 mg total) by mouth every 8 (eight) hours as needed for nausea.  Dispense: 20 tablet; Refill: 1   ___________________________________________ Omere Marti de [place], MD, ABFM, [facility]

## 2024-07-04 NOTE — Patient Instructions (Signed)

## 2024-07-04 NOTE — Telephone Encounter (Signed)
 Patient made an appt with de peru at 2:30 on 8/25

## 2024-07-04 NOTE — Assessment & Plan Note (Signed)
 Patient first noted symptoms about 2 to 3 days ago.  Reports that she felt like she was hit by a bus, did have fever over the weekend, last fever recorded was Sunday morning.  She has had bodyaches, mild shortness of breath with activity, intermittent nausea and vomiting, difficulty with keeping food down.  She does report that she had a family travel recently who did end up becoming sick and thinks this may have been where she was exposed.  She has received prior COVID vaccines. On exam, patient is in no acute distress, mild discomfort, vital signs with normal blood pressure, slightly elevated heart rate.  Normal oxygenation.  Cardiovascular exam with slightly tachycardic rate, regular rhythm.  Lungs clear to auscultation bilaterally, no wheezing noted.  Patient able to speak in complete sentences, no obvious dyspnea during office visit.  Abdomen with normal bowel sounds, soft, nontender, nondistended. We discussed considerations, reviewed Paxlovid and indications for this.  Patient would fall into somewhat of a gray area in this regard.  She has noted some improvement since the initial symptom onset.  At this time, we will hold off on Paxlovid.  We can proceed with symptomatic management.  Given nausea with occasional vomiting, we will send in for Zofran  for patient to utilize.  We did discuss trying to maintain appropriate hydration.  Reviewed signs and symptoms of dehydration for patient to be mindful of.  If these do develop, recommend presenting to emergency department for consideration of IV fluids.  Also discussed that if any worsening in breathing occurs, recommend proceeding to ER for further evaluation. We can also use Tessalon  Perles to help with cough.  Did discuss potential use of honey as well, either on its own or mixed with tea. No provided today for work to keep her out of work through Wednesday.  Discussed that if still with notable symptoms or if developing any fevers, to let us  know and we  can extend work note for her.

## 2024-07-22 ENCOUNTER — Other Ambulatory Visit (HOSPITAL_COMMUNITY): Payer: Self-pay

## 2024-07-22 MED ORDER — OXYCODONE-ACETAMINOPHEN 10-325 MG PO TABS
1.0000 | ORAL_TABLET | Freq: Four times a day (QID) | ORAL | 0 refills | Status: DC | PRN
  Filled 2024-07-22: qty 28, 7d supply, fill #0

## 2024-07-23 ENCOUNTER — Other Ambulatory Visit (HOSPITAL_COMMUNITY): Payer: Self-pay

## 2024-07-25 ENCOUNTER — Telehealth: Admitting: Physician Assistant

## 2024-07-25 DIAGNOSIS — L559 Sunburn, unspecified: Secondary | ICD-10-CM

## 2024-07-25 DIAGNOSIS — S00521A Blister (nonthermal) of lip, initial encounter: Secondary | ICD-10-CM | POA: Diagnosis not present

## 2024-07-25 MED ORDER — PREDNISONE 20 MG PO TABS: 40.0000 mg | ORAL_TABLET | Freq: Every day | ORAL | 0 refills | Status: DC

## 2024-07-25 MED ORDER — MUPIROCIN 2 % EX OINT: 1.0000 | TOPICAL_OINTMENT | Freq: Two times a day (BID) | CUTANEOUS | 0 refills | Status: DC

## 2024-07-25 NOTE — Patient Instructions (Signed)
 Hannah Lynch, thank you for joining Delon CHRISTELLA Dickinson, PA-C for today's virtual visit.  While this provider is not your primary care provider (PCP), if your PCP is located in our provider database this encounter information will be shared with them immediately following your visit.   A [facility] MyChart account gives you access to today's visit and all your visits, tests, and labs performed at [facility]  click here if you don't have a [facility] MyChart account or go to [email or web address]  Consent: (Patient) Keyetta Hollingworth Oland provided verbal consent for this virtual visit at the beginning of the encounter.  Current Medications:  Current Outpatient Medications:    mupirocin  ointment (BACTROBAN ) 2 %, Apply 1 Application topically 2 (two) times daily., Disp: 22 g, Rfl: 0   predniSONE  (DELTASONE ) 20 MG tablet, Take 2 tablets (40 mg total) by mouth daily with breakfast., Disp: 14 tablet, Rfl: 0   benzonatate  (TESSALON ) 200 MG capsule, Take 1 capsule (200 mg total) by mouth 3 (three) times daily as needed for cough., Disp: 45 capsule, Rfl: 0   citalopram  (CELEXA ) 20 MG tablet, Take 1 tablet (20 mg total) by mouth daily., Disp: 90 tablet, Rfl: 3   docusate sodium  (COLACE) 100 MG capsule, Take 100 mg by mouth daily., Disp: , Rfl:    levothyroxine  (SYNTHROID ) 25 MCG tablet, Take 1 tablet (25 mcg total) by mouth daily., Disp: 90 tablet, Rfl: 2   metoprolol  succinate (TOPROL -XL) 25 MG 24 hr tablet, Take 1 tablet (25 mg total) by mouth daily., Disp: 90 tablet, Rfl: 3   montelukast  (SINGULAIR ) 10 MG tablet, Take 1 tablet (10 mg total) by mouth daily., Disp: 90 tablet, Rfl: 2   Multiple Vitamin (MULTIVITAMIN) tablet, Take 1 tablet by mouth daily., Disp: , Rfl:    ondansetron  (ZOFRAN -ODT) 8 MG disintegrating tablet, Take 1 tablet (8 mg total) by mouth every 8 (eight) hours as needed for nausea., Disp: 20 tablet, Rfl: 1   oxyCODONE -acetaminophen  (PERCOCET) 10-325 MG tablet, Take 1  tablet by mouth every 6 (six) hours as needed for pain for up to 7 days. Max daily amount = 4 tablets., Disp: 28 tablet, Rfl: 0   Rimegepant Sulfate (NURTEC) 75 MG TBDP, Take 1 tablet (75 mg total) by mouth as needed (for migraine). Max dosage 75mg  in 24 hour period, Disp: 10 tablet, Rfl: 1   topiramate  (TOPAMAX ) 50 MG tablet, Take 1 tablet (50 mg total) by mouth 2 (two) times daily., Disp: 180 tablet, Rfl: 3   traZODone  (DESYREL ) 150 MG tablet, Take 1 tablet (150 mg total) by mouth at bedtime., Disp: 90 tablet, Rfl: 2   Medications ordered in this encounter:  Meds ordered this encounter  Medications   predniSONE  (DELTASONE ) 20 MG tablet    Sig: Take 2 tablets (40 mg total) by mouth daily with breakfast.    Dispense:  14 tablet    Refill:  0    Supervising Provider:   LAMPTEY, PHILIP O [[id number]]   mupirocin  ointment (BACTROBAN ) 2 %    Sig: Apply 1 Application topically 2 (two) times daily.    Dispense:  22 g    Refill:  0    Supervising Provider:   BLAISE ALEENE KIDD [[id number]]     *If you need refills on other medications prior to your next appointment, please contact your pharmacy*  Follow-Up: Call back or seek an in-person evaluation if the symptoms worsen or if the condition fails to improve as anticipated.  [facility] (  [phone]  Other Instructions  Sunburn, Adult  Sunburn is damage to the skin that is caused by too much exposure to ultraviolet (UV) rays. Repeated, prolonged sun exposure causes signs of early skin aging, such as wrinkles and sun spots. It also increases the risk of skin cancer. What are the causes? Sunburn is caused by getting too much UV radiation from the sun, sunlamps, or tanning beds. What increases the risk? The following factors may make you more likely to develop this condition: Having light-colored skin (fair complexion), skin with many freckles or moles, or skin that tends to burn instead of tan. Having fair or red hair. Having blue  or green eyes. Other factors include: Living in an area with strong sun exposure. Having a family history of sensitivity to the sun or a family history of skin cancer. Having a body defense system (immune system) that does not work properly because of certain diseases (such as lupus) or certain drugs. Taking certain medicines that cause you to be sensitive to sunlight (have photosensitivity). What are the signs or symptoms? Symptoms of this condition include: Red or pink skin. Soreness and swelling of the skin in the affected areas. Pain. Blisters. Peeling skin. If the sunburn is severe, you may also have a headache, fever, nausea, dizziness, or fatigue. How is this diagnosed? This condition is diagnosed with a medical history and physical exam. How is this treated? Mild or moderate sunburns can often be managed with self-care strategies, including: Cool baths or cool, wet cloths (cool compresses). Moisturizer or aloe for pain relief. Over-the-counter pain relievers. Drinking extra water  to replace lost fluids and to prevent dehydration. A severe sunburn may require: Antibiotic medicines if there is an associated infection. IV fluids. Follow these instructions at home: Medicines Take or apply over-the-counter and prescription medicines only as told by your health care provider. If you were prescribed an antibiotic medicine, use it as told by your health care provider. Do not stop using the antibiotic even if your condition improves. General instructions Avoid further exposure to the sun. Protect sunburned skin by wearing clothing that covers the injured skin. Do not put ice on your sunburn. This can cause further damage. Try taking a cool bath or applying a cool compress to your skin. This may help with pain. Drink enough fluid to keep your urine pale yellow. Try applying aloe vera or a moisturizer that has soy in it to your sunburn. This may help. Do not apply aloe vera or  moisturizer with soy if your sunburn has blisters. Do not break any blisters that you may have. Keep all follow-up visits. This is important. How is this prevented?  Try to avoid the sun between 10 a.m. and 4 p.m. The sun is strongest during those hours. Apply sunscreen 15-30 minutes before you will be out in the sun. Apply a sunscreen with an SPF of 30 or higher. Consider using an SPF of 30 or higher if you will be exposed to the sun for prolonged periods of time. Use a sunscreen that protects against all of the sun's rays (broad-spectrum) and is water -resistant. Reapply sunscreen: About every 2 hours during sun exposure. More often when sweating a lot while out in the sun. After getting wet from swimming or playing in water . When you are outside, wear long sleeves, a hat, and sunglasses that block UV light. Talk with your health care provider about medicines, herbs, and foods that can make you more sensitive to light. Avoid these, if  possible. Do not use tanning beds. Contact a health care provider if: You have a fever or chills. Your symptoms do not improve with treatment. Your pain is not controlled with medicine. Your burn becomes more painful or swollen. You develop open blisters. Get help right away if: You are dizzy or you pass out. You have a severe headache or you feel confused. You vomit or have diarrhea. You develop severe blistering. You have pus or fluid coming from the blisters. These symptoms may represent a serious problem that is an emergency. Do not wait to see if the symptoms will go away. Get medical help right away. Call your local emergency services (911 in the [place] Do not drive yourself to the hospital. Summary Sunburn is caused by getting too much ultraviolet (UV) radiation from the sun, sunlamps, or tanning beds. People with light-colored skin (fair complexion) have an increased risk of sunburn. Mild or moderate sunburns can often be managed with self-care  strategies, including cool baths or cool cloths (compresses). To help prevent sunburn, apply sunscreen 15-30 minutes or more before you will be exposed to the sun. This information is not intended to replace advice given to you by your health care provider. Make sure you discuss any questions you have with your health care provider. Document Revised: 01/30/2021 Document Reviewed: 01/30/2021 Elsevier Patient Education  2024 Elsevier Inc.   If you have been instructed to have an in-person evaluation today at a local Urgent Care facility, please use the link below. It will take you to a list of all of our available [facility], including address, phone number and hours of operation. Please do not delay care.  [facility]  If you or a family member do not have a primary care provider, use the link below to schedule a visit and establish care. When you choose a [facility] primary care physician or advanced practice provider, you gain a long-term partner in health. Find a Primary Care Provider  Learn more about [facility]'s in-office and virtual care options: [facility] - Get Care Now

## 2024-07-25 NOTE — Progress Notes (Signed)
 Virtual Visit Consent   Hannah Lynch, you are scheduled for a virtual visit with a [facility] provider today. Just as with appointments in the office, your consent must be obtained to participate. Your consent will be active for this visit and any virtual visit you may have with one of our providers in the next 365 days. If you have a MyChart account, a copy of this consent can be sent to you electronically.  As this is a virtual visit, video technology does not allow for your provider to perform a traditional examination. This may limit your provider's ability to fully assess your condition. If your provider identifies any concerns that need to be evaluated in person or the need to arrange testing (such as labs, EKG, etc.), we will make arrangements to do so. Although advances in technology are sophisticated, we cannot ensure that it will always work on either your end or our end. If the connection with a video visit is poor, the visit may have to be switched to a telephone visit. With either a video or telephone visit, we are not always able to ensure that we have a secure connection.  By engaging in this virtual visit, you consent to the provision of healthcare and authorize for your insurance to be billed (if applicable) for the services provided during this visit. Depending on your insurance coverage, you may receive a charge related to this service.  I need to obtain your verbal consent now. Are you willing to proceed with your visit today? Hannah Lynch has provided verbal consent on 07/25/2024 for a virtual visit (video or telephone). Delon CHRISTELLA Dickinson, PA-C  Date: 07/25/2024 3:05 PM   Virtual Visit via Video Note   I, Delon CHRISTELLA Dickinson, connected with  Hannah Lynch  ([id number], 1971-08-31) on 07/25/24 at  2:45 PM EDT by a video-enabled telemedicine application and verified that I am speaking with the correct person using two identifiers.  Location: Patient: Virtual Visit  Location Patient: Home Provider: Virtual Visit Location Provider: Home Office   I discussed the limitations of evaluation and management by telemedicine and the availability of in person appointments. The patient expressed understanding and agreed to proceed.    History of Present Illness: Hannah Lynch is a 53 y.o. who identifies as a female who was assigned female at birth, and is being seen today for sunburn to lips with blisters. Sunburn occurred on 07/18/24. She just started working a job that is fully outdoors and did not wear sunscreen. The burn occurred on her face, but predominantly her lips got the worst with developing severe blisters. Now having pain and swelling. Hurts to open mouth. Denies any fevers, chills, nausea or vomiting.   Is having some unassociated right knee pain as well.    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 08/30/2020   Migraine without status migrainosus, not intractable 11/15/2019   GERD (gastroesophageal reflux disease) 09/27/2019   Essential hypertension 09/27/2019  Fatty liver 09/27/2019   Bilateral sensorineural hearing loss 07/14/2019   Arthropathy of cervical facet joint 08/19/2018   Lumbar facet joint pain 08/19/2018   History of staph infection 07/20/2018   Tonsillar hypertrophy 05/06/2018   Chronic cholecystitis    Hypothyroidism 12/24/2017   Nasal septal deviation 10/22/2017   Nasal turbinate hypertrophy  10/22/2017   Snoring 10/22/2017   Primary insomnia 06/25/2017   Bilateral hearing loss 06/25/2017   Lumbar stenosis with neurogenic claudication 09/25/2014    Allergies: No Known Allergies Medications:  Current Outpatient Medications:    mupirocin  ointment (BACTROBAN ) 2 %, Apply 1 Application topically 2 (two) times daily., Disp: 22 g, Rfl: 0   predniSONE  (DELTASONE ) 20 MG tablet, Take 2 tablets (40 mg total) by mouth daily with breakfast., Disp: 14 tablet, Rfl: 0   benzonatate  (TESSALON ) 200 MG capsule, Take 1 capsule (200 mg total) by mouth 3 (three) times daily as needed for cough., Disp: 45 capsule, Rfl: 0   citalopram  (CELEXA ) 20 MG tablet, Take 1 tablet (20 mg total) by mouth daily., Disp: 90 tablet, Rfl: 3   docusate sodium  (COLACE) 100 MG capsule, Take 100 mg by mouth daily., Disp: , Rfl:    levothyroxine  (SYNTHROID ) 25 MCG tablet, Take 1 tablet (25 mcg total) by mouth daily., Disp: 90 tablet, Rfl: 2   metoprolol  succinate (TOPROL -XL) 25 MG 24 hr tablet, Take 1 tablet (25 mg total) by mouth daily., Disp: 90 tablet, Rfl: 3   montelukast  (SINGULAIR ) 10 MG tablet, Take 1 tablet (10 mg total) by mouth daily., Disp: 90 tablet, Rfl: 2   Multiple Vitamin (MULTIVITAMIN) tablet, Take 1 tablet by mouth daily., Disp: , Rfl:    ondansetron  (ZOFRAN -ODT) 8 MG disintegrating tablet, Take 1 tablet (8 mg total) by mouth every 8 (eight) hours as needed for nausea., Disp: 20 tablet, Rfl: 1   oxyCODONE -acetaminophen  (PERCOCET) 10-325 MG tablet, Take 1 tablet by mouth every 6 (six) hours as needed for pain for up to 7 days. Max daily amount = 4 tablets., Disp: 28 tablet, Rfl: 0   Rimegepant Sulfate (NURTEC) 75 MG TBDP, Take 1 tablet (75 mg total) by mouth as needed (for migraine). Max dosage 75mg  in 24 hour period, Disp: 10 tablet, Rfl: 1   topiramate  (TOPAMAX ) 50 MG tablet, Take 1 tablet (50 mg total) by mouth 2 (two) times daily., Disp: 180 tablet, Rfl: 3   traZODone  (DESYREL ) 150 MG tablet, Take 1 tablet  (150 mg total) by mouth at bedtime., Disp: 90 tablet, Rfl: 2  Observations/Objective: Patient is well-developed, well-nourished in no acute distress.  Resting comfortably at home.  Head is normocephalic, atraumatic.  No labored breathing.  Speech is clear and coherent with logical content.  Patient is alert and oriented at baseline.  Blisters, open lesions, and crusted over lesions on vermilion border (upper and lower) with swelling of the mouth and lower cheeks noted  Assessment and Plan: 1. Blister of lip (Primary) - predniSONE  (DELTASONE ) 20 MG tablet; Take 2 tablets (40 mg total) by mouth daily with breakfast.  Dispense: 14 tablet; Refill: 0 - mupirocin  ointment (BACTROBAN ) 2 %; Apply 1 Application topically 2 (two) times daily.  Dispense: 22 g; Refill: 0  2. Sunburn - predniSONE  (DELTASONE ) 20 MG tablet; Take 2 tablets (40 mg total) by mouth daily with breakfast.  Dispense: 14 tablet; Refill: 0 - mupirocin  ointment (BACTROBAN ) 2 %; Apply 1 Application topically 2 (two) times daily.  Dispense: 22 g; Refill: 0  - Severe sunburn with blistering of  the lips - Add Prednisone  for inflammation and pain - Topical Mupirocin  for infection prevention - Continue to keep area moist using aloe vera and/or petroleum jelly - May use Tylenol  as needed - Stay hydrated - Seek in person evaluation if not improving or worsens  Follow Up Instructions: I discussed the assessment and treatment plan with the patient. The patient was provided an opportunity to ask questions and all were answered. The patient agreed with the plan and demonstrated an understanding of the instructions.  A copy of instructions were sent to the patient via MyChart unless otherwise noted below.    The patient was advised to call back or seek an in-person evaluation if the symptoms worsen or if the condition fails to improve as anticipated.    Delon CHRISTELLA Dickinson, PA-C

## 2024-07-26 ENCOUNTER — Telehealth: Admitting: Physician Assistant

## 2024-07-26 DIAGNOSIS — R600 Localized edema: Secondary | ICD-10-CM

## 2024-07-26 NOTE — Progress Notes (Signed)
  Because of leg swelling associated with sunburns in other areas and need for in-depth examination, I feel your condition warrants further evaluation and I recommend that you be seen in a face-to-face visit.   NOTE: There will be NO CHARGE for this E-Visit   If you are having a true medical emergency, please call 911.     For an urgent face to face visit, [facility] has multiple urgent care centers for your convenience.  Click the link below for the full list of locations and hours, walk-in wait times, appointment scheduling options and driving directions:  Urgent Care - [place], [place], [place], [place], [place], [place]  [place]     Your MyChart E-visit questionnaire answers were reviewed by a board certified advanced clinical practitioner to complete your personal care plan based on your specific symptoms.    Thank you for using e-Visits.

## 2024-08-28 ENCOUNTER — Telehealth

## 2024-09-09 ENCOUNTER — Ambulatory Visit: Admitting: Surgical

## 2024-09-11 ENCOUNTER — Other Ambulatory Visit (HOSPITAL_BASED_OUTPATIENT_CLINIC_OR_DEPARTMENT_OTHER): Payer: Self-pay | Admitting: Family Medicine

## 2024-09-12 ENCOUNTER — Encounter: Payer: Self-pay | Admitting: Radiology

## 2024-09-20 ENCOUNTER — Ambulatory Visit (INDEPENDENT_AMBULATORY_CARE_PROVIDER_SITE_OTHER): Payer: Commercial Managed Care - HMO | Admitting: Family Medicine

## 2024-09-20 ENCOUNTER — Encounter (HOSPITAL_BASED_OUTPATIENT_CLINIC_OR_DEPARTMENT_OTHER): Payer: Self-pay | Admitting: Family Medicine

## 2024-09-20 VITALS — BP 97/66 | HR 74 | Ht 63.0 in | Wt 191.9 lb

## 2024-09-20 DIAGNOSIS — G43709 Chronic migraine without aura, not intractable, without status migrainosus: Secondary | ICD-10-CM | POA: Diagnosis not present

## 2024-09-20 DIAGNOSIS — K219 Gastro-esophageal reflux disease without esophagitis: Secondary | ICD-10-CM

## 2024-09-20 DIAGNOSIS — Z0000 Encounter for general adult medical examination without abnormal findings: Secondary | ICD-10-CM

## 2024-09-20 DIAGNOSIS — L219 Seborrheic dermatitis, unspecified: Secondary | ICD-10-CM

## 2024-09-20 DIAGNOSIS — N182 Chronic kidney disease, stage 2 (mild): Secondary | ICD-10-CM

## 2024-09-20 DIAGNOSIS — F419 Anxiety disorder, unspecified: Secondary | ICD-10-CM | POA: Diagnosis not present

## 2024-09-20 DIAGNOSIS — Z23 Encounter for immunization: Secondary | ICD-10-CM | POA: Diagnosis not present

## 2024-09-20 DIAGNOSIS — Z13220 Encounter for screening for lipoid disorders: Secondary | ICD-10-CM

## 2024-09-20 DIAGNOSIS — R609 Edema, unspecified: Secondary | ICD-10-CM

## 2024-09-20 DIAGNOSIS — E039 Hypothyroidism, unspecified: Secondary | ICD-10-CM

## 2024-09-20 DIAGNOSIS — L659 Nonscarring hair loss, unspecified: Secondary | ICD-10-CM

## 2024-09-20 MED ORDER — CITALOPRAM HYDROBROMIDE 20 MG PO TABS: 20.0000 mg | ORAL_TABLET | Freq: Every day | ORAL | 3 refills | Status: AC

## 2024-09-20 MED ORDER — TRAZODONE HCL 150 MG PO TABS: 150.0000 mg | ORAL_TABLET | Freq: Every day | ORAL | 3 refills | Status: AC

## 2024-09-20 MED ORDER — TOPIRAMATE 50 MG PO TABS: 50.0000 mg | ORAL_TABLET | Freq: Two times a day (BID) | ORAL | 3 refills | Status: AC

## 2024-09-20 MED ORDER — PANTOPRAZOLE SODIUM 40 MG PO TBEC: 40.0000 mg | DELAYED_RELEASE_TABLET | Freq: Every day | ORAL | 3 refills | Status: AC

## 2024-09-20 MED ORDER — MONTELUKAST SODIUM 10 MG PO TABS: 10.0000 mg | ORAL_TABLET | Freq: Every day | ORAL | 3 refills | Status: AC

## 2024-09-20 MED ORDER — LEVOTHYROXINE SODIUM 25 MCG PO TABS: 25.0000 ug | ORAL_TABLET | Freq: Every day | ORAL | 3 refills | Status: AC

## 2024-09-20 MED ORDER — KETOCONAZOLE 2 % EX SHAM: MEDICATED_SHAMPOO | CUTANEOUS | 11 refills | Status: AC

## 2024-09-20 NOTE — Progress Notes (Signed)
 Subjective:   Hannah Lynch [birth year]  09/20/2024   CC: Chief Complaint  Patient presents with   Annual Exam    Patient is here today for her physical. Has had some bilateral leg swelling that she wants to discuss an also has had some hair loss.    HPI: Hannah Lynch is a 53 y.o. female who presents for a routine health maintenance exam.  Labs collected at time of visit.    Discussed the use of AI scribe software for clinical note transcription with the patient, who gave verbal consent to proceed.  History of Present Illness Hannah Lynch is a 53 year old female who presents for her physical and concerns with leg swelling and hair loss.  Since September, she has experienced swelling in her legs from the knees down to her feet. She uses knee-high compression socks, which have significantly helped control the swelling. Her job in traffic control requires prolonged standing. She has a family history of varicose veins, which she also has. She reports improvement of swelling with use of compression stockings and has not had symptom worsening.   She is experiencing hair loss, describing it as 'handfuls' of hair falling out. She is unsure of the cause and mentioned a previous similar experience with psoriasis of the scalp which improved with topical treatments. She states she is having itching and flaking intermittently to the scalp.   GERD:  She has been experiencing heartburn recently, which she suspects might be related to her hiatal hernia. The heartburn occurs only with meals. She has cut back on caffeine, switching to green tea and water , and occasionally drinks [facility]. She does not regularly consume spicy foods, tomatoes, or citrus. She is not currently taking any medication for heartburn.   HEALTH SCREENINGS: - Vision Screening: up to date - Dental Visits: Dentures present  - Pap smear: up to date - Breast Exam: Declined - STD Screening: Declined - Mammogram  (40+): Up to date  - Colonoscopy (45+): Up to date  - Bone Density (65+ or under 65 with predisposing conditions): Not applicable  - Lung CA screening with low-dose CT:  Not applicable Adults age 23-80 who are current cigarette smokers or quit within the last 15 years. Must have 20 pack year history.   Depression and Anxiety Screen done today and results listed below:     09/20/2024    8:09 AM 07/04/2024    2:18 PM 12/22/2023    8:11 AM 10/05/2023    9:52 AM 08/24/2023    9:30 AM  Depression screen PHQ 2/9  Decreased Interest 0 0 0 0 0  Down, Depressed, Hopeless 0 0 0 0 1  PHQ - 2 Score 0 0 0 0 1  Altered sleeping 0 0 0 0 2  Tired, decreased energy 0 0 0 0 2  Change in appetite 0 0 0 1 0  Feeling bad or failure about yourself  0 0 0 1 2  Trouble concentrating 0 0 0 0 0  Moving slowly or fidgety/restless 0 0 0 0 0  Suicidal thoughts 0 0 0 0 0  PHQ-9 Score 0 0  0  2  7   Difficult doing work/chores Not difficult at all Not difficult at all Not difficult at all Not difficult at all Somewhat difficult     Data saved with a previous flowsheet row definition      09/20/2024    8:09 AM 07/04/2024    2:18 PM 12/22/2023  8:11 AM 10/05/2023    9:54 AM  GAD 7 : Generalized Anxiety Score  Nervous, Anxious, on Edge 0 0 0 1  Control/stop worrying 0 0 0 0  Worry too much - different things 0 0 0 1  Trouble relaxing 0 0 0 0  Restless 0 0 0 0  Easily annoyed or irritable 0 0 0 0  Afraid - awful might happen 0 0 0 0  Total GAD 7 Score 0 0 0 2  Anxiety Difficulty Not difficult at all Not difficult at all Not difficult at all Not difficult at all    IMMUNIZATIONS: - Tdap: Tetanus vaccination status reviewed: last tetanus booster within 10 years. - HPV: Not applicable - Influenza: Up to date - Pneumovax: Up to date - Prevnar 20: Administered today - Shingrix (50+): Up to date   Past medical history, surgical history, medications, allergies, family history and social history reviewed  with patient today and changes made to appropriate areas of the chart.   Past Medical History:  Diagnosis Date   Allergy    Anemia 07/2024   Anxiety    Chronic kidney disease 2025   Degenerative disc disease, lumbar    Degenerative disc disease, lumbar 2012   lumbar and cervical region   Depression    Depression, major, single episode, moderate (HCC) 08/24/2023   Endometriosis    Family history of adverse reaction to anesthesia    pt's mother has hx. of post-op N/V   GERD (gastroesophageal reflux disease) 08/2024   Headache    History of hiatal hernia    History of staph infection 07/20/2018   Hypertension    Hypothyroidism    IBS (irritable bowel syndrome)    Impingement syndrome of right shoulder 08/20/2022   Nasal septal deviation 11/2017   Nasal turbinate hypertrophy 11/2017   PONV (postoperative nausea and vomiting)    Snoring 11/2017   Trochanteric bursitis, left hip 08/30/2020    Past Surgical History:  Procedure Laterality Date   ABDOMINAL HYSTERECTOMY  2005   partial - TAH, no cervix, has both ovaries; done for endometriosis   ANTERIOR CERVICAL DECOMP/DISCECTOMY FUSION  03/25/2007   C4-5   CERVICAL FUSION  2013   CHOLECYSTECTOMY N/A 03/26/2018   Procedure: LAPAROSCOPIC CHOLECYSTECTOMY;  Surgeon: Mavis Anes, MD;  Location: [facility];  Service: General;  Laterality: N/A;   COLONOSCOPY     HERNIA REPAIR     HIATAL HERNIA REPAIR     completed with gastric sleeve   KNEE ARTHROSCOPY Left    LAPAROSCOPIC GASTRIC SLEEVE RESECTION N/A 09/27/2019   Procedure: LAPAROSCOPIC GASTRIC SLEEVE RESECTION, HIATAL HERNIA REPARI ;Upper Endo, Eras Pathway;  Surgeon: Tanda Locus, MD;  Location: [facility];  Service: General;  Laterality: N/A;   LUMBAR LAMINECTOMY/DECOMPRESSION MICRODISCECTOMY  11/21/2004   L4-5   MAXIMUM ACCESS (MAS)POSTERIOR LUMBAR INTERBODY FUSION (PLIF) 1 LEVEL N/A 09/25/2014   Procedure: Lumbar four-five MAXIMUM ACCESS (MAS) POSTERIOR LUMBAR INTERBODY FUSION  (PLIF) 1 LEVEL;  Surgeon: Fairy Levels, MD;  Location: [facility];  Service: Neurosurgery;  Laterality: N/A;   NASAL SEPTOPLASTY W/ TURBINOPLASTY N/A 12/07/2017   Procedure: NASAL SEPTOPLASTY WITH BILATERAL TURBINATE REDUCTION;  Surgeon: Carlie Clark, MD;  Location: [facility];  Service: ENT;  Laterality: N/A;   ORIF ANKLE FRACTURE Right    SPINE SURGERY     TONSILLECTOMY  06/2018   TUBAL LIGATION     UPPER GI ENDOSCOPY      Current Outpatient Medications on File Prior to  Visit  Medication Sig   docusate sodium  (COLACE) 100 MG capsule Take 100 mg by mouth daily.   HYDROcodone -acetaminophen  (NORCO) 10-325 MG tablet    methocarbamol  (ROBAXIN ) 500 MG tablet TAKE 1 TABLET BY MOUTH THREE TIMES DAILY AS NEEDED FOR MUSCLE SPASMS FOR UP TO 30 DAYS   metoprolol  succinate (TOPROL -XL) 25 MG 24 hr tablet Take 1 tablet (25 mg total) by mouth daily.   Multiple Vitamin (MULTIVITAMIN) tablet Take 1 tablet by mouth daily.   Rimegepant Sulfate (NURTEC) 75 MG TBDP Take 1 tablet (75 mg total) by mouth as needed (for migraine). Max dosage 75mg  in 24 hour period   No current facility-administered medications on file prior to visit.    No Known Allergies   Social History   Socioeconomic History   Marital status: Married    Spouse name: Not on file   Number of children: Not on file   Years of education: Not on file   Highest education level: Associate degree: academic program  Occupational History   Not on file  Tobacco Use   Smoking status: Never   Smokeless tobacco: Never  Vaping Use   Vaping status: Never Used  Substance and Sexual Activity   Alcohol use: Yes    Comment: once in awhile   Drug use: Never   Sexual activity: Yes    Birth control/protection: Surgical    Comment: Hysterectomy  Other Topics Concern   Not on file  Social History Narrative   Not on file   Social Drivers of Health   Financial Resource Strain: Low Risk  (09/19/2024)   Overall Financial  Resource Strain (CARDIA)    Difficulty of Paying Living Expenses: Not hard at all  Food Insecurity: No Food Insecurity (09/19/2024)   Hunger Vital Sign    Worried About Running Out of Food in the Last Year: Never true    Ran Out of Food in the Last Year: Never true  Transportation Needs: No Transportation Needs (09/19/2024)   PRAPARE - Administrator, Civil Service (Medical): No    Lack of Transportation (Non-Medical): No  Physical Activity: Insufficiently Active (09/19/2024)   Exercise Vital Sign    Days of Exercise per Week: 4 days    Minutes of Exercise per Session: 30 min  Stress: Stress Concern Present (09/19/2024)   [employer] of Occupational Health - Occupational Stress Questionnaire    Feeling of Stress: To some extent  Social Connections: Moderately Isolated (09/19/2024)   Social Connection and Isolation Panel    Frequency of Communication with Friends and Family: More than three times a week    Frequency of Social Gatherings with Friends and Family: More than three times a week    Attends Religious Services: 1 to 4 times per year    Active Member of [employer] or Organizations: No    Attends Banker Meetings: Not on file    Marital Status: Separated  Intimate Partner Violence: Not At Risk (07/24/2024)   Received from [facility]   HITS    Over the last 12 months how often did your partner physically hurt you?: Never    Over the last 12 months how often did your partner insult you or talk down to you?: Never    Over the last 12 months how often did your partner threaten you with physical harm?: Never    Over the last 12 months how often did your partner scream or curse at you?: Never   Social History  Tobacco Use  Smoking Status Never  Smokeless Tobacco Never   Social History   Substance and Sexual Activity  Alcohol Use Yes   Comment: once in awhile    Family History  Problem Relation Age of Onset   Hypertension Mother    Diabetes  Mother    Arthritis Mother    Hearing loss Mother    Vision loss Mother    Varicose Veins Mother    Heart failure Father    Hypertension Father    Diabetes Father    Early death Father    Heart disease Father    Heart failure Other    Breast cancer Paternal Grandmother      ROS: Denies fever, fatigue, unexplained weight loss/gain, chest pain, SHOB, and palpitations. Denies neurological deficits, gastrointestinal or genitourinary complaints, and skin changes.   Objective:   Today's Vitals   09/20/24 0804  BP: 97/66  Pulse: 74  SpO2: 99%  Weight: 191 lb 14.4 oz (87 kg)  Height: 5' 3 (1.6 m)    GENERAL APPEARANCE: Well-appearing, in NAD. Well nourished.  SKIN: Pink, warm and dry. Turgor normal. No rash, lesion, ulceration, or ecchymoses. Hair evenly distributed with good turgor. Mild flaking and irritation present to scalp.  HEENT: HEAD: Normocephalic.  EYES: PERRLA. EOMI. Lids intact w/o defect. Sclera white, Conjunctiva pink w/o exudate.  EARS: External ear w/o redness, swelling, masses or lesions. EAC clear. TM's intact, translucent w/o bulging, appropriate landmarks visualized. Appropriate acuity to conversational tones.  NOSE: Septum midline w/o deformity. Nares patent, mucosa pink and non-inflamed w/o drainage. No sinus tenderness.  THROAT: Uvula midline. Oropharynx clear. Oral mucosa pink and moist.  NECK: Supple, Trachea midline. Full ROM w/o pain or tenderness. No lymphadenopathy. Thyroid  non-tender w/o enlargement or palpable masses.  RESPIRATORY: Chest wall symmetrical w/o masses. Respirations even and non-labored. Breath sounds clear to auscultation bilaterally. No wheezes, rales, rhonchi, or crackles. CARDIAC: S1, S2 present, regular rate and rhythm. No gallops, murmurs, rubs, or clicks. PMI w/o lifts, heaves, or thrills. No carotid bruits. Capillary refill <2 seconds. Peripheral pulses 2+ bilaterally. GI: Abdomen soft w/o distention. Normoactive bowel sounds. No  palpable masses or tenderness. No guarding or rebound tenderness. Liver and spleen w/o tenderness or enlargement. No CVA tenderness.  MSK: Muscle tone and strength appropriate for age, w/o atrophy or abnormal movement.  EXTREMITIES: Active ROM intact, w/o tenderness, crepitus, or contracture. No obvious joint deformities or effusions. No clubbing, edema, or cyanosis.  NEUROLOGIC: CN's II-XII intact. Motor strength symmetrical with no obvious weakness. No sensory deficits. DTR's 2+ symmetric bilaterally. Steady, even gait.  PSYCH/MENTAL STATUS: Alert, oriented x 3. Cooperative, appropriate mood and affect.     Assessment & Plan:  1. Annual physical exam (Primary) Discussed preventative screenings, vaccines, and healthy lifestyle with patient. Fasting labs completed in office today. She is UTD on recommended screenings.  - CBC with Differential/Platelet - Lipid panel - Hemoglobin A1c  2. Anxiety Stable. Continue current regimen.  - citalopram  (CELEXA ) 20 MG tablet; Take 1 tablet (20 mg total) by mouth daily.  Dispense: 90 tablet; Refill: 3  3. Chronic migraine without aura without status migrainosus, not intractable Stable. Continue current regimen.  - topiramate  (TOPAMAX ) 50 MG tablet; Take 1 tablet (50 mg total) by mouth 2 (two) times daily.  Dispense: 180 tablet; Refill: 3  4. Seborrheic dermatitis Start Ketoconazole shampoo as directed. Will also check her Vit D, CBC and Iron for contributing factors.   5. Dependent edema Improved and stable. Continue  monitoring sodium intake and wearing compression socks daily.   6. Gastroesophageal reflux disease without esophagitis Uncontrolled. Start Pantoprazole  40mg  daily x 30 days, then use as needed. If no improvement, reach out to PCP within 2 weeks.  - pantoprazole  (PROTONIX ) 40 MG tablet; Take 1 tablet (40 mg total) by mouth daily.  Dispense: 30 tablet; Refill: 3  7. Acquired hypothyroidism Previously stable with current levothyroxine   regimen.Will check TSH with labs today.  - TSH  8. CKD (chronic kidney disease) stage 2, GFR 60-89 ml/min Stable previously. Will check CMP with labs today. Managed with diet, exercise and hydration. BP well controlled.  - Comprehensive metabolic panel with GFR  9. Screening for lipid disorders - Lipid panel  10. Immunization due Patient requested. VIS provided with vaccine counseling.  - Pneumococcal conjugate vaccine 20-valent (Prevnar 20)   Orders Placed This Encounter  Procedures   Pneumococcal conjugate vaccine 20-valent (Prevnar 20)   CBC with Differential/Platelet   Comprehensive metabolic panel with GFR   Lipid panel   TSH   Hemoglobin A1c   VITAMIN D 25 Hydroxy (Vit-D Deficiency, Fractures)   Iron, TIBC and Ferritin Panel    PATIENT COUNSELING:  - Encouraged a healthy well-balanced diet. Patient may adjust caloric intake to maintain or achieve ideal body weight. May reduce intake of dietary saturated fat and total fat and have adequate dietary potassium and calcium preferably from fresh fruits, vegetables, and low-fat dairy products.   - Advised to avoid cigarette smoking. - Discussed with the patient that most people either abstain from alcohol or drink within safe limits (<=14/week and <=4 drinks/occasion for males, <=7/weeks and <= 3 drinks/occasion for females) and that the risk for alcohol disorders and other health effects rises proportionally with the number of drinks per week and how often a drinker exceeds daily limits. - Discussed cessation/primary prevention of drug use and availability of treatment for abuse.  - Discussed sexually transmitted diseases, avoidance of unintended pregnancy and contraceptive alternatives.  - Stressed the importance of regular exercise - Injury prevention: Discussed safety belts, safety helmets, smoke detector, smoking near bedding or upholstery.  - Dental health: Discussed importance of regular tooth brushing, flossing, and dental  visits.   NEXT PREVENTATIVE PHYSICAL DUE IN 1 YEAR.  Return in about 6 months (around 03/20/2025) for Follow up CKD, Hypothyroidism .  Patient to reach out to office if new, worrisome, or unresolved symptoms arise or if no improvement in patient's condition. Patient verbalized understanding and is agreeable to treatment plan. All questions answered to patient's satisfaction.    Thersia Schuyler Stark, [place]

## 2024-09-21 ENCOUNTER — Ambulatory Visit (HOSPITAL_BASED_OUTPATIENT_CLINIC_OR_DEPARTMENT_OTHER): Payer: Self-pay | Admitting: Family Medicine

## 2024-09-21 LAB — CBC WITH DIFFERENTIAL/PLATELET
Basophils Absolute: 0 x10E3/uL (ref 0.0–0.2)
Basos: 1 %
EOS (ABSOLUTE): 0.1 x10E3/uL (ref 0.0–0.4)
Eos: 3 %
Hematocrit: 41.2 % (ref 34.0–46.6)
Hemoglobin: 13.5 g/dL (ref 11.1–15.9)
Immature Grans (Abs): 0 x10E3/uL (ref 0.0–0.1)
Immature Granulocytes: 0 %
Lymphocytes Absolute: 2 x10E3/uL (ref 0.7–3.1)
Lymphs: 42 %
MCH: 32.3 pg (ref 26.6–33.0)
MCHC: 32.8 g/dL (ref 31.5–35.7)
MCV: 99 fL — ABNORMAL HIGH (ref 79–97)
Monocytes Absolute: 0.3 x10E3/uL (ref 0.1–0.9)
Monocytes: 7 %
Neutrophils Absolute: 2.3 x10E3/uL (ref 1.4–7.0)
Neutrophils: 47 %
Platelets: 206 x10E3/uL (ref 150–450)
RBC: 4.18 x10E6/uL (ref 3.77–5.28)
RDW: 12.2 % (ref 11.7–15.4)
WBC: 4.8 x10E3/uL (ref 3.4–10.8)

## 2024-09-21 LAB — COMPREHENSIVE METABOLIC PANEL WITH GFR
ALT: 21 IU/L (ref 0–32)
AST: 27 IU/L (ref 0–40)
Albumin: 4.2 g/dL (ref 3.8–4.9)
Alkaline Phosphatase: 66 IU/L (ref 49–135)
BUN/Creatinine Ratio: 9 (ref 9–23)
BUN: 9 mg/dL (ref 6–24)
Bilirubin Total: 0.3 mg/dL (ref 0.0–1.2)
CO2: 23 mmol/L (ref 20–29)
Calcium: 9.1 mg/dL (ref 8.7–10.2)
Chloride: 105 mmol/L (ref 96–106)
Creatinine, Ser: 1 mg/dL (ref 0.57–1.00)
Globulin, Total: 2.3 g/dL (ref 1.5–4.5)
Glucose: 92 mg/dL (ref 70–99)
Potassium: 4.5 mmol/L (ref 3.5–5.2)
Sodium: 141 mmol/L (ref 134–144)
Total Protein: 6.5 g/dL (ref 6.0–8.5)
eGFR: 68 mL/min/1.73 (ref >59)

## 2024-09-21 LAB — TSH: TSH: 3.93 u[IU]/mL (ref 0.450–4.500)

## 2024-09-21 LAB — LIPID PANEL
Chol/HDL Ratio: 3.9 ratio (ref 0.0–4.4)
Cholesterol, Total: 192 mg/dL (ref 100–199)
HDL: 49 mg/dL (ref >39)
LDL Chol Calc (NIH): 127 mg/dL — ABNORMAL HIGH (ref 0–99)
Triglycerides: 85 mg/dL (ref 0–149)
VLDL Cholesterol Cal: 16 mg/dL (ref 5–40)

## 2024-09-21 LAB — IRON,TIBC AND FERRITIN PANEL
Ferritin: 88 ng/mL (ref 15–150)
Iron Saturation: 31 % (ref 15–55)
Iron: 80 ug/dL (ref 27–159)
Total Iron Binding Capacity: 259 ug/dL (ref 250–450)
UIBC: 179 ug/dL (ref 131–425)

## 2024-09-21 LAB — HEMOGLOBIN A1C
Est. average glucose Bld gHb Est-mCnc: 100 mg/dL
Hgb A1c MFr Bld: 5.1 % (ref 4.8–5.6)

## 2024-09-21 LAB — VITAMIN D 25 HYDROXY (VIT D DEFICIENCY, FRACTURES): Vit D, 25-Hydroxy: 45.3 ng/mL (ref 30.0–100.0)

## 2024-09-21 NOTE — Progress Notes (Signed)
 Hi Aalyiah, Your blood counts are stable.  Your cholesterol has increased slightly from last check.  Please continue with a heart healthy diet and regular exercise.  Your kidney function is stable and has slightly improved from last year.  Your electrolytes and liver function are within normal limits.  Your thyroid  function is stable.  Your A1c is outside of the prediabetic range.  Vitamin D is excellent and your iron panel is also excellent.  This is likely not contributing to any hair thinning.  Please use the supplied shampoo and let me know if this does not make a difference.

## 2024-09-23 ENCOUNTER — Encounter (HOSPITAL_BASED_OUTPATIENT_CLINIC_OR_DEPARTMENT_OTHER): Payer: Self-pay | Admitting: Family Medicine

## 2024-09-26 NOTE — Telephone Encounter (Signed)
 Please see mychart message sent by pt and advise.

## 2024-09-28 ENCOUNTER — Other Ambulatory Visit: Payer: Self-pay

## 2024-09-28 ENCOUNTER — Other Ambulatory Visit (INDEPENDENT_AMBULATORY_CARE_PROVIDER_SITE_OTHER): Payer: Self-pay

## 2024-09-28 ENCOUNTER — Encounter: Payer: Self-pay | Admitting: Surgical

## 2024-09-28 ENCOUNTER — Ambulatory Visit (INDEPENDENT_AMBULATORY_CARE_PROVIDER_SITE_OTHER): Admitting: Surgical

## 2024-09-28 DIAGNOSIS — M79604 Pain in right leg: Secondary | ICD-10-CM

## 2024-09-28 DIAGNOSIS — M25561 Pain in right knee: Secondary | ICD-10-CM

## 2024-09-28 MED ORDER — LIDOCAINE HCL 1 % IJ SOLN
5.0000 mL | INTRAMUSCULAR | Status: AC | PRN
  Administered 2024-09-28: 5 mL

## 2024-09-28 NOTE — Progress Notes (Signed)
 Office Visit Note   Patient: Hannah Lynch           Date of Birth: 08/09/1971           MRN: [id number] Visit Date: 09/28/2024 Requested by: Knute Thersia Bitters, FNP [address] [place],  [place] [address] PCP: Knute Thersia Bitters, FNP  Subjective: Chief Complaint  Patient presents with   Right Knee - Pain    HPI: Hannah Lynch is a 53 y.o. female who presents to the office reporting right knee pain.  Patient states that she fell directly on her knee on 07/17/2024.  She had a hyperflexion injury at the same time where her leg got pinned underneath her while she was laying on her back.  She had so much difficulty that she had to call her dad to come in to help her up.  She had severe pain in the left knee and hip region for about 1 to 2 weeks with difficulty putting weight on that leg.  Noticed some bruising around the lateral aspect of her hip at that time.  She fell again on the knee in the first week of October as her left ankle gave way.  Describes pain primarily in the anterior medial aspects of the knee.  Has occasional hip pain as well that she localizes around the lateral aspect of the trochanter and the anterior aspect of the pelvis.  She had to use a walker for a period of time.  Pain has improved but only about 20 to 30%.  She recently had spinal cord stimulator placed by Dr. Rosella.  Also takes Norco 5 mg for back pain.  None of this is helping with her knee pain.  She has seen her PCP and been to urgent care/ER multiple times without much relief or resolution to her symptoms.  Has had Doppler which was negative for DVT..                ROS: All systems reviewed are negative as they relate to the chief complaint within the history of present illness.  Patient denies fevers or chills.  Assessment & Plan: Visit Diagnoses:  1. Acute pain of right knee   2. Pain in right leg     Plan: Impression is 53 year old female who presents for evaluation of  right leg pain after hyperflexion injury where she fell and had her leg pinned behind her while on her back.  She had severe pain primarily in the knee but also in the hip region with bruising in the hip for 1 to 2 weeks requiring the use of a walker.  This pain has improved but she still has persistent symptoms that are only about 30% improved over the last 2 months now.  She has had prior MRI scan of the right knee demonstrating mild arthritis without ligamentous or meniscal injury.  We discussed options available to patient and with her severe pain following this injury and the limited weightbearing for several weeks, I think it would be ideal to obtain new MRI scan.  Hard to say if this is truly knee pain or referred pain from the pelvis.  Lidocaine  injection administered today into the right knee.  She will let me know how this does for her for the next hour through MyChart and if it gives her great improvement, we will order MRI of the right knee.  If no improvement we will order MRI of the right hip.  Follow-Up Instructions: No  follow-ups on file.   Orders:  Orders Placed This Encounter  Procedures   XR KNEE 3 VIEW RIGHT   XR HIPS BILAT W OR W/O PELVIS 3-4 VIEWS   No orders of the defined types were placed in this encounter.     Procedures: Large Joint Inj: R knee on 09/28/2024 1:21 PM Indications: diagnostic evaluation, joint swelling and pain Details: 25 G 1.5 in needle, superolateral approach  Arthrogram: No  Medications: 5 mL lidocaine  1 % Outcome: tolerated well, no immediate complications Procedure, treatment alternatives, risks and benefits explained, specific risks discussed. Consent was given by the patient. Immediately prior to procedure a time out was called to verify the correct patient, procedure, equipment, support staff and site/side marked as required. Patient was prepped and draped in the usual sterile fashion.       Clinical Data: No additional  findings.  Objective: Vital Signs: There were no vitals taken for this visit.  Physical Exam:  Constitutional: Patient appears well-developed HEENT:  Head: Normocephalic Eyes:EOM are normal Neck: Normal range of motion Cardiovascular: Normal rate Pulmonary/chest: Effort normal Neurologic: Patient is alert Skin: Skin is warm Psychiatric: Patient has normal mood and affect  Ortho Exam: Ortho exam demonstrates right knee with trace effusion.  Tender over the medial joint line moderately and tender over the lateral joint line mildly.  Able to perform straight leg raise without extensor lag.  She has quad strength and straight leg raise strength within 90% of the contralateral leg strength.  Hamstring strength rated 5/5.  She does have tenderness over both greater trochanters bilaterally rated mild.  She has asymmetric tenderness over ASIS and AIIS of the right hemipelvis rated moderate with no tenderness over the left hemipelvis.  She has some pain reproduced with hip range of motion with positive FADIR sign.  Stable to anterior and posterior drawer sign of the right knee.  Stable to varus and valgus stress at 0 and 30 degrees.  No deformity noted to the right knee.  No difference in range of motion compared with prior exams.  Specialty Comments:  No specialty comments available.  Imaging: No results found.   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 08/30/2020   Migraine without status migrainosus, not intractable 11/15/2019   GERD (gastroesophageal reflux disease) 09/27/2019   Essential hypertension 09/27/2019   Fatty liver 09/27/2019   Bilateral sensorineural hearing loss 07/14/2019   Arthropathy of cervical facet joint 08/19/2018   Lumbar facet joint pain 08/19/2018   Chronic cholecystitis    Hypothyroidism 12/24/2017   Nasal septal deviation 10/22/2017   Nasal turbinate hypertrophy 10/22/2017   Snoring 10/22/2017   Primary insomnia 06/25/2017   Bilateral hearing loss 06/25/2017   Lumbar stenosis with neurogenic claudication 09/25/2014   Past Medical History:  Diagnosis Date   Allergy    Anemia 07/2024   Anxiety    Chronic kidney disease 2025   Degenerative disc disease, lumbar    Degenerative disc  disease, lumbar 2012   lumbar and cervical region   Depression    Depression, major, single episode, moderate (HCC) 08/24/2023   Endometriosis    Family history of adverse reaction to anesthesia    pt's mother has hx. of post-op N/V   GERD (gastroesophageal reflux disease) 08/2024   Headache    History of hiatal hernia    History of staph infection 07/20/2018   Hypertension    Hypothyroidism    IBS (irritable bowel syndrome)    Impingement syndrome of right shoulder 08/20/2022   Nasal septal deviation 11/2017   Nasal turbinate hypertrophy 11/2017   PONV (postoperative nausea and vomiting)    Snoring 11/2017   Tonsillar hypertrophy 05/06/2018   Trochanteric bursitis, left hip 08/30/2020    Family History  Problem Relation Age of Onset   Hypertension Mother    Diabetes Mother    Arthritis Mother    Hearing loss Mother    Vision loss Mother    Varicose Veins Mother    Heart failure Father    Hypertension Father    Diabetes Father    Early death Father    Heart disease Father    Heart failure Other    Breast cancer Paternal Grandmother     Past Surgical History:  Procedure Laterality Date    ABDOMINAL HYSTERECTOMY  2005   partial - [facility], no cervix, has both ovaries; done for endometriosis   ANTERIOR CERVICAL DECOMP/DISCECTOMY FUSION  03/25/2007   C4-5   CERVICAL FUSION  2013   CHOLECYSTECTOMY N/A 03/26/2018   Procedure: LAPAROSCOPIC CHOLECYSTECTOMY;  Surgeon: Mavis Anes, MD;  Location: [facility];  Service: General;  Laterality: N/A;   COLONOSCOPY     HERNIA REPAIR     HIATAL HERNIA REPAIR     completed with gastric sleeve   KNEE ARTHROSCOPY Left    LAPAROSCOPIC GASTRIC SLEEVE RESECTION N/A 09/27/2019   Procedure: LAPAROSCOPIC GASTRIC SLEEVE RESECTION, HIATAL HERNIA REPARI ;Upper Endo, Eras Pathway;  Surgeon: Tanda Locus, MD;  Location: [facility];  Service: General;  Laterality: N/A;   LUMBAR LAMINECTOMY/DECOMPRESSION MICRODISCECTOMY  11/21/2004   L4-5   MAXIMUM ACCESS (MAS)POSTERIOR LUMBAR INTERBODY FUSION (PLIF) 1 LEVEL N/A 09/25/2014   Procedure: Lumbar four-five MAXIMUM ACCESS (MAS) POSTERIOR LUMBAR INTERBODY FUSION (PLIF) 1 LEVEL;  Surgeon: Fairy Levels, MD;  Location: [facility];  Service: Neurosurgery;  Laterality: N/A;   NASAL SEPTOPLASTY W/ TURBINOPLASTY N/A 12/07/2017   Procedure: NASAL SEPTOPLASTY WITH BILATERAL TURBINATE REDUCTION;  Surgeon: Carlie Clark, MD;  Location: [facility];  Service: ENT;  Laterality: N/A;   ORIF ANKLE FRACTURE Right    SPINE SURGERY     TONSILLECTOMY  06/2018   TUBAL LIGATION     UPPER GI ENDOSCOPY     Social History   Occupational History   Not on file  Tobacco Use   Smoking status: Never   Smokeless tobacco: Never  Vaping Use   Vaping status: Never Used  Substance and Sexual Activity   Alcohol use: Yes    Comment: once in awhile   Drug use: Never   Sexual activity: Yes    Birth control/protection: Surgical    Comment: Hysterectomy

## 2024-09-29 NOTE — Telephone Encounter (Signed)
 Hi Betsy, can we order MRI of the right knee?  To evaluate meniscal injury following hyperflexion injury.

## 2024-09-30 NOTE — Telephone Encounter (Signed)
 SABRA

## 2024-10-03 ENCOUNTER — Other Ambulatory Visit: Payer: Self-pay

## 2024-10-03 DIAGNOSIS — M25561 Pain in right knee: Secondary | ICD-10-CM

## 2024-10-03 NOTE — Telephone Encounter (Signed)
 I obtained prior auth from [facility], and emailed April P/Toni C w/ [facility] scheduling, with information off the patient's spinal cord stimulator info card. They will contact the patient.

## 2024-10-19 ENCOUNTER — Ambulatory Visit (HOSPITAL_COMMUNITY)

## 2024-10-26 ENCOUNTER — Ambulatory Visit (HOSPITAL_COMMUNITY): Admission: RE | Admit: 2024-10-26 | Discharge: 2024-10-26 | Attending: Surgical

## 2024-10-26 DIAGNOSIS — M25561 Pain in right knee: Secondary | ICD-10-CM | POA: Insufficient documentation

## 2024-11-14 NOTE — Telephone Encounter (Signed)
 I called patient.  Discussed options after reviewing the MRI over the phone.  She is going to consider her options and call us  back or send me another MyChart message.

## 2025-03-21 ENCOUNTER — Ambulatory Visit (HOSPITAL_BASED_OUTPATIENT_CLINIC_OR_DEPARTMENT_OTHER): Admitting: Family Medicine
